# Patient Record
Sex: Female | Born: 1985
Health system: Southern US, Community
[De-identification: ages and names within clinical notes are randomized; demographics above are authoritative.]

## PROBLEM LIST (undated history)

## (undated) DIAGNOSIS — R935 Abnormal findings on diagnostic imaging of other abdominal regions, including retroperitoneum: Secondary | ICD-10-CM

## (undated) DIAGNOSIS — F329 Major depressive disorder, single episode, unspecified: Secondary | ICD-10-CM

## (undated) DIAGNOSIS — Z87440 Personal history of urinary (tract) infections: Secondary | ICD-10-CM

## (undated) DIAGNOSIS — F419 Anxiety disorder, unspecified: Secondary | ICD-10-CM

## (undated) DIAGNOSIS — Z8619 Personal history of other infectious and parasitic diseases: Secondary | ICD-10-CM

## (undated) DIAGNOSIS — O24419 Gestational diabetes mellitus in pregnancy, unspecified control: Secondary | ICD-10-CM

## (undated) DIAGNOSIS — K5900 Constipation, unspecified: Secondary | ICD-10-CM

## (undated) DIAGNOSIS — R109 Unspecified abdominal pain: Secondary | ICD-10-CM

## (undated) DIAGNOSIS — IMO0002 Reserved for concepts with insufficient information to code with codable children: Secondary | ICD-10-CM

## (undated) DIAGNOSIS — E119 Type 2 diabetes mellitus without complications: Secondary | ICD-10-CM

## (undated) DIAGNOSIS — R87619 Unspecified abnormal cytological findings in specimens from cervix uteri: Secondary | ICD-10-CM

## (undated) DIAGNOSIS — F32A Depression, unspecified: Secondary | ICD-10-CM

## (undated) DIAGNOSIS — R87629 Unspecified abnormal cytological findings in specimens from vagina: Secondary | ICD-10-CM

## (undated) DIAGNOSIS — K759 Inflammatory liver disease, unspecified: Secondary | ICD-10-CM

## (undated) HISTORY — DX: Personal history of urinary (tract) infections: Z87.440

## (undated) HISTORY — DX: Anxiety disorder, unspecified: F41.9

## (undated) HISTORY — DX: Gestational diabetes mellitus in pregnancy, unspecified control: O24.419

## (undated) HISTORY — DX: Abnormal findings on diagnostic imaging of other abdominal regions, including retroperitoneum: R93.5

## (undated) HISTORY — DX: Unspecified abdominal pain: R10.9

## (undated) HISTORY — DX: Unspecified abnormal cytological findings in specimens from vagina: R87.629

## (undated) HISTORY — DX: Type 2 diabetes mellitus without complications: E11.9

## (undated) HISTORY — DX: Personal history of other infectious and parasitic diseases: Z86.19

## (undated) HISTORY — PX: WISDOM TOOTH EXTRACTION: SHX21

## (undated) HISTORY — DX: Constipation, unspecified: K59.00

## (undated) HISTORY — PX: ESOPHAGOGASTRODUODENOSCOPY ENDOSCOPY: SHX5814

---
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
## 2004-03-16 ENCOUNTER — Inpatient Hospital Stay (HOSPITAL_COMMUNITY): Admission: EM | Admit: 2004-03-16 | Discharge: 2004-03-21 | Payer: Self-pay | Admitting: Emergency Medicine

## 2005-11-15 ENCOUNTER — Emergency Department (HOSPITAL_COMMUNITY): Admission: EM | Admit: 2005-11-15 | Discharge: 2005-11-15 | Payer: Self-pay | Admitting: Emergency Medicine

## 2009-08-21 ENCOUNTER — Emergency Department (HOSPITAL_COMMUNITY): Admission: EM | Admit: 2009-08-21 | Discharge: 2009-08-21 | Payer: Self-pay | Admitting: Emergency Medicine

## 2010-10-26 NOTE — H&P (Signed)
NAMEJANECIA, PALAU             ACCOUNT NO.:  [id number]   MEDICAL RECORD NO.:  [id number]          PATIENT TYPE:  INP   LOCATION:  0356                         FACILITY:  [facility]   PHYSICIAN:  Theone Stanley, MD   DATE OF BIRTH:  01/29/1986   DATE OF ADMISSION:  03/15/2004  DATE OF DISCHARGE:                                HISTORY & PHYSICAL   HISTORY OF PRESENT ILLNESS:  This is a 25 year old female who did not give  much of a history.  It is mainly from her friends and her family.  Per  patient's friend, she took 50 acetaminophen pills at 3 a.m.  Subsequently,  she was vomiting all day and finally at about 9 a.m. she told RA of the  dorm.  At that point in time she was taken to the ER.  Again, according to  the friend, patient has been very depressed, more noticeably over the past  two to three weeks she stopped going to class and to work.  Also, the  patient has been drinking nightly both beer and hard liquor, approximately 6-  pack per night.  When I asked the patient about her situation she did not  want to talk about it.  She did complain of some nausea.  Otherwise, she  denied any other pain or problems.   PAST MEDICAL HISTORY:  1.  Depression definitely for the past two years.  Has been tried on      medications but secondary to side effects they were stopped.  2.  Hepatitis B carrier.  According to the parents, her liver enzymes have      been in normal range.   MEDICATIONS:  None.   ALLERGIES:  None.   PAST SURGICAL HISTORY:  None.   FAMILY HISTORY:  Unable to obtain.  Patient is adopted.   SOCIAL HISTORY:  Patient is single.  Lives with a roommate in her dorm.  Smokes about one pack per day.  Uses marijuana on a daily basis.  Her  parents adopted her from [place].   REVIEW OF SYSTEMS:  Patient denies any headache, lightheadedness, dizziness,  fevers, chills, weight gain or weight loss.  No change in her bowel habits.  No problems with urinary issues.  Patient  does have some mid epigastric  abdominal pain.  She is nauseated, but no current emesis.  No chest pain.  No shortness of breath.  No cough.  No intolerance to heat or cold.   PHYSICAL EXAMINATION:  VITAL SIGNS:  Blood pressure 132/95, temperature  97.4, respiratory rate 20, saturating 100% on room air.  HEENT:  Head normocephalic, atraumatic.  Eyes:  2 mm, pupils reactive to  light.  Extraocular movements are intact.  Ears and nose:  No discharge.  Throat:  Oropharynx shows clear.  CARDIOVASCULAR:  Regular rate and rhythm.  No murmurs, rubs, or gallops  heard.  LUNGS:  Clear to auscultation bilaterally.  ABDOMEN:  Soft, nondistended.  Patient did have some mild tenderness in the  mid epigastric region.  No rebound.  EXTREMITIES:  No edema, cyanosis, or clubbing.  2+ pulses  dorsalis pedis and  posterior tibial pulses.  NEUROLOGIC:  Alert and oriented x3.  Strength 5/5.   LABORATORIES:  Chest x-ray was normal.  White count 6, hemoglobin 15,  hematocrit 45, platelets 324.  Sodium 136, potassium 3.7, chloride 105, CO2  22, BUN 18, creatinine 0.8, glucose 112.  Pregnancy test is negative.  Total  bilirubin 1.3, alkaline phosphatase 59, SGOT 53, slightly elevated; SGPT 30,  normal.  Acetaminophen level was 40.8 which was done at 10 p.m. on October 6  which would be 20 hours since her ingestion.  Tox screen showed positive for  marijuana, otherwise negative.   ASSESSMENT/PLAN:  1.  Aileene is a 25 year old female of [place] descent with hepatitis B carrier      state presenting with acetaminophen overdose.  Based on the [place]-      Matthew nomogram, patient has probable hepatic toxicity.  Start loading      dose of intravenous and acetylcysteine which is 150 mg/kg over an hour.      If patient tolerates that she will start on a maintenance dose of 15      mg/kg/hour.  Will check a CBC, BMP, LFTs, PT, PTT in the morning.  Will      also obtain a psychiatric consult.  2.  Polysubstance abuse.   This appears very much related to patient's      ongoing depression.  She will need counseling and help for this.  3.  Depression.  This apparently has been an issue for the past two years.      Parents have repeatedly asked the patient to get help and she has      refused.  She has tried some medications, but apparently had stopped      because of side effects.  Will get psychiatry involved.  4.  Mid epigastric abdominal pain most likely gastritis from alcohol, also      multiple emesis.  Will watch and start on PPI.  5.  Hepatitis B carrier state.  This has been monitored for quite some time      and according to the parents her LFTs have always been normal.  We will      keep this in mind with her current issue.  6.  Prophylaxis.  Start her on Protonix IV and SCDs while in bed.      AEJ/MEDQ  D:  03/16/2004  T:  03/16/2004  Job:  [id number]

## 2010-10-26 NOTE — Discharge Summary (Signed)
Kim Pacheco, Kim Pacheco             ACCOUNT NO.:  [id number]   MEDICAL RECORD NO.:  [id number]          PATIENT TYPE:  INP   LOCATION:  0374                         FACILITY:  [facility]   PHYSICIAN:  Jackie Plum, M.D.DATE OF BIRTH:  11-18-1985   DATE OF ADMISSION:  03/15/2004  DATE OF DISCHARGE:  03/21/2004                           DISCHARGE SUMMARY - REFERRING   ADDENDUM:  This is an addendum to March 17, 2004 discharge summary.   The patient was anticipated to be discharged on the eighth, however, because  of an increase in her liver enzymes she had her discharge delayed.  Her case  was discussed with Poison Control and it was felt that it was unusual for  her PT, INR to remain normal but her liver enzymes to rise again.  However  they decided on consultation with Dr. Greggory Stallion Osei-Bonsu,  to reinitiate some  acetylcysteine IV.  She received another dose for 24 hours at 15mg /kg.  Her liver function tests dropped down nicely.  Today her SGOT was 26, SGPT  90 (SGOT being in normal range and SGPT again regressing downwards).  Her PT  was 13, INR of 1.  The patient was doing well enough and had no complaints.  It was felt that she could be discharged to [facility] psychiatric ward for  further inpatient psychiatric evaluation and treatment.  The patient left in  stable condition.  Please see original discharge summary by Dr. Amada Kingfisher-  Bonsu on March 17, 2004 for further details, job number [id number].     Geor   GO/MEDQ  D:  03/21/2004  T:  03/21/2004  Job:  [id number]

## 2010-10-26 NOTE — Discharge Summary (Signed)
Kim Pacheco, Kim Pacheco             ACCOUNT NO.:  [id number]   MEDICAL RECORD NO.:  [id number]          PATIENT TYPE:  INP   LOCATION:  0356                         [facility]   PHYSICIAN:  Jackie Plum, M.D.DATE OF BIRTH:  1986-05-18   DATE OF ADMISSION:  03/15/2004  DATE OF DISCHARGE:  03/17/2004                                 DISCHARGE SUMMARY   DISCHARGE DIAGNOSES:  1.  Tylenol overdose, status post IV acetylcysteine administration .      1.  Discharge liver function tests of PT 15.5, INR 1.3, PTT 25, alkaline          phosphatase 44, total bilirubin 0.5, SGOT 85, SGPT 72, total protein          5.8, albumin 2.8.  2.  Major depressive disorder.  3.  History of hepatitis B.  4.  History of polysubstance abuse.   DISCHARGE MEDICATIONS:  1.  Protonix 40 mg p.o. daily.  2.  Phenergan 12.5 to 25 mg p.o. q.6h. p.r.n.  3.  Maalox 30 ml p.o. q.6h. p.r.n.   DISCHARGE LABORATORY DATA:  WBC count of 11.7, hemoglobin 13.9, hematocrit  40.9, MCV 89.5, platelet count 298,000. Coagulation profile as mentioned  above. Sodium 138, potassium 4.0, chloride 110, CO2 23, glucose 89, BUN 4,  creatinine 0.9. LFTs as noted above.  Acetaminophen level less than 10.0.   CONSULTATIONS:  Dr. Jeanie Sewer of psychiatry.   PROCEDURE:  Not applicable.   CONDITION ON DISCHARGE:  Improved and satisfactory.   DISPOSITION:  The patient is going to [facility] for inpatient  psychiatric treatment.   REASON FOR ADMISSION:  Tylenol overdose/suicide attempt.   The patient presented to the [facility] emergency room after ingestion of  about 50 tablets of acetaminophen pills. She was noted to be depressed for  some time prior to this episode.  She had also been drinking a lot during  this period as well. She was brought to the ED on account of persistent  vomiting. According to H&P by Dr. Wynonia Lawman, patient was ___________ on  presentation and her cardiovascular exam was unremarkable. Her lungs  were  clear to auscultation bilaterally. Abdomen was noted to be soft,  nondistended, but had some mild tenderness in the mid epigastric area. She  is alert and oriented times three on neurologic testing without any focal  deficits.   Her admission labs include __________ which was said to be normal. Her  chemistries were essentially unremarkable. She had an alkaline phosphatase  of 59, bilirubin 1.3, SGOT 53, and SGPT of 30.  Acetaminophen level was  40.8, which was done about 20 hours from ingestion. Toxicology screen was  positive for marijuana. She was thus admitted for Tylenol overdose and  started on IV acetylcysteine after consultation with Poison Control. During  the last 24 hours she received acetylcysteine  IV with monitoring of her  liver function testing. She also had some other supportive medicine  including IV supplementation and antiemetics amongst others. Overnight the  patient had an episode of vomiting with some complaints of abdominal pain  and this morning had completely resolved. I  discussed the patient's LFTs  this morning with Bess of Poison Control who in turn discussed over the  telephone with a toxicologist, and after review of the patient's labs (she  had some mild upward trend of her transaminases) it was deemed appropriate  to discharge the patient medically prior to resuming psychiatric treatment.  Acetylcysteine IV has been discontinued and she is going to be transferred  to [facility] on these discharge medications. This morning Ms.  Sollecito denies any abdominal pain, nausea, or vomiting. She denies any  dizziness. She is clinically well hydrated. Her lungs are clear to  auscultation. Cardiac exam reveals a regular rate and rhythm without any  gallops or murmur. Abdomen is soft, nontender. She has normoactive bowel  sounds. She is alert and oriented times three with no acute focal deficits.  She is going to be discharged to the service of  psychiatric medicine.     Geor   GO/MEDQ  D:  03/17/2004  T:  03/17/2004  Job:  [id number]   cc:   Antonietta Breach, M.D.   [facility]

## 2012-05-22 ENCOUNTER — Ambulatory Visit (INDEPENDENT_AMBULATORY_CARE_PROVIDER_SITE_OTHER): Payer: BC Managed Care – PPO | Admitting: Internal Medicine

## 2012-05-22 VITALS — BP 113/74 | HR 71 | Temp 98.0°F | Resp 16 | Ht 59.75 in | Wt 126.0 lb

## 2012-05-22 DIAGNOSIS — R109 Unspecified abdominal pain: Secondary | ICD-10-CM

## 2012-05-22 DIAGNOSIS — B181 Chronic viral hepatitis B without delta-agent: Secondary | ICD-10-CM

## 2012-05-22 LAB — POCT URINALYSIS DIPSTICK
Bilirubin, UA: NEGATIVE
Blood, UA: NEGATIVE
Glucose, UA: NEGATIVE
Leukocytes, UA: NEGATIVE
Nitrite, UA: NEGATIVE
Protein, UA: NEGATIVE
Spec Grav, UA: >=1.03
Urobilinogen, UA: 0.2
pH, UA: 5.5

## 2012-05-22 LAB — POCT UA - MICROSCOPIC ONLY
Amorphous: POSITIVE
Casts, Ur, LPF, POC: NEGATIVE
Crystals, Ur, HPF, POC: NEGATIVE
Mucus, UA: POSITIVE
Yeast, UA: NEGATIVE

## 2012-05-22 MED ORDER — HYDROCODONE-ACETAMINOPHEN 5-325 MG PO TABS: 1.0000 | ORAL_TABLET | Freq: Four times a day (QID) | ORAL | Status: DC | PRN

## 2012-05-22 NOTE — Patient Instructions (Signed)
Sleep Apnea  Sleep apnea is a sleep disorder characterized by abnormal pauses in breathing while you sleep. When your breathing pauses, the level of oxygen in your blood decreases. This causes you to move out of deep sleep and into light sleep. As a result, your quality of sleep is poor, and the system that carries your blood throughout your body (cardiovascular system) experiences stress. If sleep apnea remains untreated, the following conditions can develop:  High blood pressure (hypertension).  Coronary artery disease.  Inability to achieve or maintain an erection (impotence).  Impairment of your thought process (cognitive dysfunction). There are three types of sleep apnea: 1. Obstructive sleep apnea Pauses in breathing during sleep because of a blocked airway. 2. Central sleep apnea Pauses in breathing during sleep because the area of the brain that controls your breathing does not send the correct signals to the muscles that control breathing. 3. Mixed sleep apnea A combination of both obstructive and central sleep apnea. RISK FACTORS The following risk factors can increase your risk of developing sleep apnea:  Being overweight.  Smoking.  Having narrow passages in your nose and throat.  Being of older age.  Being female.  Alcohol use.  Sedative and tranquilizer use.  Ethnicity. Among individuals younger than 35 years, African Americans are at increased risk of sleep apnea. SYMPTOMS   Difficulty staying asleep.  Daytime sleepiness and fatigue.  Loss of energy.  Irritability.  Loud, heavy snoring.  Morning headaches.  Trouble concentrating.  Forgetfulness.  Decreased interest in sex. DIAGNOSIS  In order to diagnose sleep apnea, your caregiver will perform a physical examination. Your caregiver may suggest that you take a home sleep test. Your caregiver may also recommend that you spend the night in a sleep lab. In the sleep lab, several monitors record  information about your heart, lungs, and brain while you sleep. Your leg and arm movements and blood oxygen level are also recorded. TREATMENT The following actions may help to resolve mild sleep apnea:  Sleeping on your side.   Using a decongestant if you have nasal congestion.   Avoiding the use of depressants, including alcohol, sedatives, and narcotics.   Losing weight and modifying your diet if you are overweight. There also are devices and treatments to help open your airway:  Oral appliances. These are custom-made mouthpieces that shift your lower jaw forward and slightly open your bite. This opens your airway.  Devices that create positive airway pressure. This positive pressure "splints" your airway open to help you breathe better during sleep. The following devices create positive airway pressure:  Continuous positive airway pressure (CPAP) device. The CPAP device creates a continuous level of air pressure with an air pump. The air is delivered to your airway through a mask while you sleep. This continuous pressure keeps your airway open.  Nasal expiratory positive airway pressure (EPAP) device. The EPAP device creates positive air pressure as you exhale. The device consists of single-use valves, which are inserted into each nostril and held in place by adhesive. The valves create very little resistance when you inhale but create much more resistance when you exhale. That increased resistance creates the positive airway pressure. This positive pressure while you exhale keeps your airway open, making it easier to breath when you inhale again.  Bilevel positive airway pressure (BPAP) device. The BPAP device is used mainly in patients with central sleep apnea. This device is similar to the CPAP device because it also uses an air pump to deliver   continuous air pressure through a mask. However, with the BPAP machine, the pressure is set at two different levels. The pressure when you  exhale is lower than the pressure when you inhale.  Surgery. Typically, surgery is only done if you cannot comply with less invasive treatments or if the less invasive treatments do not improve your condition. Surgery involves removing excess tissue in your airway to create a wider passage way. Document Released: 05/17/2002 Document Revised: 11/26/2011 Document Reviewed: 10/03/2011 Exit[employer] Patient Information 2013 [employer].  

## 2012-05-22 NOTE — Progress Notes (Signed)
  Subjective:    Patient ID: Kim Pacheco, female    DOB: May 15, 1986, 26 y.o.   MRN: [id number]  HPI Has left side and flank pain since heavy workout 2 days ago   Review of Systems     Objective:   Physical Exam  Constitutional: She is oriented to person, place, and time. She appears well-developed and well-nourished. No distress.  Eyes: EOM are normal.  Neck: Neck supple.  Cardiovascular: Normal rate, regular rhythm and normal heart sounds.   Pulmonary/Chest: Effort normal and breath sounds normal.  Abdominal: Soft. Bowel sounds are normal. She exhibits no distension and no mass. There is tenderness. There is no rebound and no guarding.  Musculoskeletal: She exhibits tenderness.  Neurological: She is alert and oriented to person, place, and time. She exhibits normal muscle tone. Coordination normal.  Skin: Skin is warm and dry. No rash noted.  Psychiatric: She has a normal mood and affect.   Tender left flank/side more in muscle and back than abdomen  Results for orders placed in visit on 05/22/12  POCT URINALYSIS DIPSTICK      Component Value Range   Color, UA yellow     Clarity, UA clear     Glucose, UA neg     Bilirubin, UA neg     Ketones, UA trace     Spec Grav, UA >=1.030     Blood, UA neg     pH, UA 5.5     Protein, UA neg     Urobilinogen, UA 0.2     Nitrite, UA neg     Leukocytes, UA Negative    POCT UA - MICROSCOPIC ONLY      Component Value Range   WBC, Ur, HPF, POC 0-3     RBC, urine, microscopic 0-1     Bacteria, U Microscopic trace     Mucus, UA pos     Epithelial cells, urine per micros 1-4     Crystals, Ur, HPF, POC neg     Casts, Ur, LPF, POC neg     Yeast, UA neg     Amorphous pos          Assessment & Plan:  Strain likely Ice rest observe Do further testing if persists

## 2012-05-25 ENCOUNTER — Telehealth: Payer: Self-pay

## 2012-05-25 NOTE — Telephone Encounter (Signed)
PT SAW DR. Perrin Maltese FOR SIDE PAIN LAST WEEK.  SHE WAS PRESCRIBED HYDROCODONE.  SAYS IT IS MAKING HER SICK AND IS NOT HELPING THE PAIN AT ALL.  CAN WE CALL HER IN SOMETHING ELSE.  [phone]

## 2012-05-25 NOTE — Telephone Encounter (Signed)
Has she been taking any tylenol or ibuprofen? It looks like he thought this was a strained muscle.  If it is not improving she should return for further evaluation.

## 2012-05-25 NOTE — Telephone Encounter (Signed)
Patient advised to try Ibuprofen 600mg  tid for this, if no relief she is to return to clinic.

## 2012-05-25 NOTE — Telephone Encounter (Signed)
She states the meds make her dizzy and she has nausea. She has d/c this medication. The pain she was having has gotten a little better, but still has some pain at times, would like something else besides the hydrocodone, please advise.

## 2012-05-25 NOTE — Telephone Encounter (Signed)
Called patient to see how she is with her pain, was flank/ abdominal pain if not better and now she feels sick she should return to clinic. Left message for her to call me back.

## 2012-07-28 LAB — OB RESULTS CONSOLE HEPATITIS B SURFACE ANTIGEN: Hepatitis B Surface Ag: POSITIVE

## 2012-07-28 LAB — OB RESULTS CONSOLE RPR: RPR: NONREACTIVE

## 2012-07-28 LAB — OB RESULTS CONSOLE GC/CHLAMYDIA: Gonorrhea: NEGATIVE

## 2012-07-28 LAB — OB RESULTS CONSOLE ABO/RH: RH Type: POSITIVE

## 2012-09-10 ENCOUNTER — Ambulatory Visit (INDEPENDENT_AMBULATORY_CARE_PROVIDER_SITE_OTHER): Payer: BC Managed Care – PPO | Admitting: Family Medicine

## 2012-09-10 VITALS — BP 110/64 | HR 72 | Temp 98.6°F | Resp 16 | Ht 59.0 in | Wt 134.2 lb

## 2012-09-10 DIAGNOSIS — J069 Acute upper respiratory infection, unspecified: Secondary | ICD-10-CM

## 2012-09-10 LAB — POCT RAPID STREP A (OFFICE): Rapid Strep A Screen: NEGATIVE

## 2012-09-10 MED ORDER — AMOXICILLIN 500 MG PO CAPS: 500.0000 mg | ORAL_CAPSULE | Freq: Three times a day (TID) | ORAL | Status: DC

## 2012-09-10 MED ORDER — IPRATROPIUM BROMIDE 0.03 % NA SOLN: 2.0000 | Freq: Two times a day (BID) | NASAL | Status: DC

## 2012-09-10 NOTE — Progress Notes (Signed)
[address]   [place], [place]  [address]   [phone]  Subjective:    Patient ID: Kim Pacheco, female    DOB: January 27, 1986, 27 y.o.   MRN: [id number]  HPI This 27 y.o. female presents for evaluation of sore throat, rhinorrhea, nasal congestion.  Onset two days ago.  Onset with sore throat, malaise, fatigue, headache, nasal congestion.  No fever/chills/sweats.  +HA.  R ear pain mild.  +ST top mostly; dry; sore throat intermittent; mild pain with swallowing.  No coughing.  No n/v/d.  +sneezing a lot.  +itchy eyes.  [redacted] weeks pregnant.  Has taken Robitussin night and day.  No history of allergic rhinitis.  OB:  [facility]   Review of Systems  Constitutional: Positive for fatigue. Negative for fever, chills and diaphoresis.  HENT: Positive for ear pain, congestion, sore throat, rhinorrhea, sneezing, trouble swallowing, voice change and postnasal drip.   Respiratory: Negative for cough and shortness of breath.   Gastrointestinal: Negative for nausea, vomiting and diarrhea.  Skin: Negative for rash.  Neurological: Positive for headaches.    History reviewed. No pertinent past medical history.  History reviewed. No pertinent past surgical history.  Prior to Admission medications   Medication Sig Start Date End Date Taking? Authorizing Provider  Prenatal Vit-Fe Fumarate-FA (PRENATAL MULTIVITAMIN) TABS Take 1 tablet by mouth daily at 12 noon.   Yes Historical Provider, MD  HYDROcodone-acetaminophen (NORCO/VICODIN) 5-325 MG per tablet Take 1 tablet by mouth every 6 (six) hours as needed for pain. 05/22/12   Jonita Albee, MD    No Known Allergies  History   Social History  . Marital Status: Married    Spouse Name: N/A    Number of Children: N/A  . Years of Education: N/A   Occupational History  . Not on file.   Social History Main Topics  . Smoking status: Former Smoker -- 1.00 packs/day    Types: Cigarettes  . Smokeless tobacco: Never Used     Comment: Patient  would like counseling  . Alcohol Use: No  . Drug Use: No  . Sexually Active: Not on file   Other Topics Concern  . Not on file   Social History Narrative   Chemical engineer.    No family history on file.     Objective:   Physical Exam  Nursing note and vitals reviewed. Constitutional: She is oriented to person, place, and time. She appears well-developed and well-nourished. No distress.  HENT:  Head: Normocephalic and atraumatic.  Right Ear: External ear normal.  Left Ear: External ear normal.  Nose: Nose normal.  Mouth/Throat: Oropharynx is clear and moist. No oropharyngeal exudate.  Eyes: Conjunctivae are normal. Pupils are equal, round, and reactive to light.  Neck: Normal range of motion. Neck supple. No thyromegaly present.  Cardiovascular: Normal rate, regular rhythm, normal heart sounds and intact distal pulses.  Exam reveals no gallop and no friction rub.   No murmur heard. Pulmonary/Chest: Effort normal and breath sounds normal. No respiratory distress. She has no wheezes. She has no rales.  Abdominal:  Gravid.  Lymphadenopathy:    She has no cervical adenopathy.  Neurological: She is alert and oriented to person, place, and time.  Skin: She is not diaphoretic.  Psychiatric: She has a normal mood and affect. Her behavior is normal.    Results for orders placed in visit on 09/10/12  POCT RAPID STREP A (OFFICE)      Result Value Range  Rapid Strep A Screen Negative  Negative       Assessment & Plan:  URI (upper respiratory infection) - Plan: POCT rapid strep A, Culture, Group A Strep    1. URI:  New.  Rapid strep negative; send throat culture.  Rx for Atrovent nasal spray (Class B) bid; rx for Amoxicillin provided to fill if throat culture positive.  Supportive care with rest, fluids, Tylenol.  OOW note x 4/3, 4/4.  2. Pregnancy:  At 18 weeks; supportive care with rest, fluids, Tylenol.  Meds ordered this encounter  Medications  . Prenatal  Vit-Fe Fumarate-FA (PRENATAL MULTIVITAMIN) TABS    Sig: Take 1 tablet by mouth daily at 12 noon.  Marland Kitchen ipratropium (ATROVENT) 0.03 % nasal spray    Sig: Place 2 sprays into the nose 2 (two) times daily.    Dispense:  30 mL    Refill:  5  . amoxicillin (AMOXIL) 500 MG capsule    Sig: Take 1 capsule (500 mg total) by mouth 3 (three) times daily.    Dispense:  30 capsule    Refill:  0

## 2012-09-10 NOTE — Patient Instructions (Addendum)
URI (upper respiratory infection) - Plan: POCT rapid strep A, Culture, Group A Strep, ipratropium (ATROVENT) 0.03 % nasal spray, amoxicillin (AMOXIL) 500 MG capsule  Upper Respiratory Infection, Adult An upper respiratory infection (URI) is also sometimes known as the common cold. The upper respiratory tract includes the nose, sinuses, throat, trachea, and bronchi. Bronchi are the airways leading to the lungs. Most people improve within 1 week, but symptoms can last up to 2 weeks. A residual cough may last even longer.  CAUSES Many different viruses can infect the tissues lining the upper respiratory tract. The tissues become irritated and inflamed and often become very moist. Mucus production is also common. A cold is contagious. You can easily spread the virus to others by oral contact. This includes kissing, sharing a glass, coughing, or sneezing. Touching your mouth or nose and then touching a surface, which is then touched by another person, can also spread the virus. SYMPTOMS  Symptoms typically develop 1 to 3 days after you come in contact with a cold virus. Symptoms vary from person to person. They may include:  Runny nose.  Sneezing.  Nasal congestion.  Sinus irritation.  Sore throat.  Loss of voice (laryngitis).  Cough.  Fatigue.  Muscle aches.  Loss of appetite.  Headache.  Low-grade fever. DIAGNOSIS  You might diagnose your own cold based on familiar symptoms, since most people get a cold 2 to 3 times a year. Your caregiver can confirm this based on your exam. Most importantly, your caregiver can check that your symptoms are not due to another disease such as strep throat, sinusitis, pneumonia, asthma, or epiglottitis. Blood tests, throat tests, and X-rays are not necessary to diagnose a common cold, but they may sometimes be helpful in excluding other more serious diseases. Your caregiver will decide if any further tests are required. RISKS AND COMPLICATIONS  You may  be at risk for a more severe case of the common cold if you smoke cigarettes, have chronic heart disease (such as heart failure) or lung disease (such as asthma), or if you have a weakened immune system. The very young and very old are also at risk for more serious infections. Bacterial sinusitis, middle ear infections, and bacterial pneumonia can complicate the common cold. The common cold can worsen asthma and chronic obstructive pulmonary disease (COPD). Sometimes, these complications can require emergency medical care and may be life-threatening. PREVENTION  The best way to protect against getting a cold is to practice good hygiene. Avoid oral or hand contact with people with cold symptoms. Wash your hands often if contact occurs. There is no clear evidence that vitamin C, vitamin E, echinacea, or exercise reduces the chance of developing a cold. However, it is always recommended to get plenty of rest and practice good nutrition. TREATMENT  Treatment is directed at relieving symptoms. There is no cure. Antibiotics are not effective, because the infection is caused by a virus, not by bacteria. Treatment may include:  Increased fluid intake. Sports drinks offer valuable electrolytes, sugars, and fluids.  Breathing heated mist or steam (vaporizer or shower).  Eating chicken soup or other clear broths, and maintaining good nutrition.  Getting plenty of rest.  Using gargles or lozenges for comfort.  Controlling fevers with ibuprofen or acetaminophen as directed by your caregiver.  Increasing usage of your inhaler if you have asthma. Zinc gel and zinc lozenges, taken in the first 24 hours of the common cold, can shorten the duration and lessen the severity of symptoms.  Pain medicines may help with fever, muscle aches, and throat pain. A variety of non-prescription medicines are available to treat congestion and runny nose. Your caregiver can make recommendations and may suggest nasal or lung  inhalers for other symptoms.  HOME CARE INSTRUCTIONS   Only take over-the-counter or prescription medicines for pain, discomfort, or fever as directed by your caregiver.  Use a warm mist humidifier or inhale steam from a shower to increase air moisture. This may keep secretions moist and make it easier to breathe.  Drink enough water and fluids to keep your urine clear or pale yellow.  Rest as needed.  Return to work when your temperature has returned to normal or as your caregiver advises. You may need to stay home longer to avoid infecting others. You can also use a face mask and careful hand washing to prevent spread of the virus. SEEK MEDICAL CARE IF:   After the first few days, you feel you are getting worse rather than better.  You need your caregiver's advice about medicines to control symptoms.  You develop chills, worsening shortness of breath, or brown or red sputum. These may be signs of pneumonia.  You develop yellow or brown nasal discharge or pain in the face, especially when you bend forward. These may be signs of sinusitis.  You develop a fever, swollen neck glands, pain with swallowing, or white areas in the back of your throat. These may be signs of strep throat. SEEK IMMEDIATE MEDICAL CARE IF:   You have a fever.  You develop severe or persistent headache, ear pain, sinus pain, or chest pain.  You develop wheezing, a prolonged cough, cough up blood, or have a change in your usual mucus (if you have chronic lung disease).  You develop sore muscles or a stiff neck. Document Released: 11/20/2000 Document Revised: 08/19/2011 Document Reviewed: 09/28/2010 [facility] Patient Information 2013 [place], [place].

## 2012-09-12 LAB — CULTURE, GROUP A STREP: Organism ID, Bacteria: NORMAL

## 2012-11-25 ENCOUNTER — Encounter: Payer: BC Managed Care – PPO | Attending: Obstetrics and Gynecology | Admitting: *Deleted

## 2012-11-25 VITALS — Ht 59.75 in | Wt 154.1 lb

## 2012-11-25 DIAGNOSIS — O99810 Abnormal glucose complicating pregnancy: Secondary | ICD-10-CM

## 2012-11-25 DIAGNOSIS — Z713 Dietary counseling and surveillance: Secondary | ICD-10-CM | POA: Insufficient documentation

## 2012-11-26 ENCOUNTER — Encounter: Payer: Self-pay | Admitting: *Deleted

## 2012-11-26 NOTE — Progress Notes (Signed)
  Patient was seen on 11/25/12 for Gestational Diabetes self-management class at the [facility]. The following learning objectives were met by the patient during this course:   States the definition of Gestational Diabetes  States why dietary management is important in controlling blood glucose  Describes the effects each nutrient has on blood glucose levels  Demonstrates ability to create a balanced meal plan  Demonstrates carbohydrate counting   States when to check blood glucose levels  Demonstrates proper blood glucose monitoring techniques  States the effect of stress and exercise on blood glucose levels  States the importance of limiting caffeine and abstaining from alcohol and smoking  Blood glucose monitor given: [employer] EZ Lot # [id number] Exp: 10/2013 Blood glucose reading: 185 mg/dl  Patient instructed to monitor glucose levels: FBS: 60 - <90 2 hour: <120  *Patient received handouts:  Nutrition Diabetes and Pregnancy  Carbohydrate Counting List  Patient will be seen for follow-up as needed.

## 2012-11-26 NOTE — Patient Instructions (Signed)
Goals:  Check glucose levels per MD as instructed  Follow Gestational Diabetes Diet as instructed  Call for follow-up as needed    

## 2012-12-14 ENCOUNTER — Encounter: Payer: Self-pay | Admitting: *Deleted

## 2012-12-14 ENCOUNTER — Encounter: Payer: BC Managed Care – PPO | Attending: Obstetrics and Gynecology | Admitting: *Deleted

## 2012-12-14 DIAGNOSIS — Z713 Dietary counseling and surveillance: Secondary | ICD-10-CM | POA: Insufficient documentation

## 2012-12-14 DIAGNOSIS — O99810 Abnormal glucose complicating pregnancy: Secondary | ICD-10-CM | POA: Insufficient documentation

## 2012-12-14 NOTE — Progress Notes (Signed)
  Medical Nutrition Therapy:  Appt start time: 1030 end time:  1100.  Assessment:  Primary concerns today: patient here for follow up to GDM Class. She states she is following the GDM diet and is exercising but her FBG and post meals are consistently too high. She is concerned about having to take insulin.   MEDICATIONS: see list.   DIETARY INTAKE:  Usual eating pattern includes 3 meals and 3 snacks per day.  Everyday foods include good variety of all food groups in appropriate serving sizes.  Avoided foods include foods high in carbohydrate or fat.    24-hr recall:  B ( AM): 2 hard boiled eggs, 1 slice toast, low fat cream cheese, 8 oz low fat milk  Snk ( AM): yogurt  L ( PM): chicken with 1/2 to 1 cup pasta or a salad, water Snk ( PM): cheese or a banana, PNB, water D ( PM): chicken with pasta, occasionally a vegetable, water Snk ( PM): PNB on a spoon Beverages: water, milk  Usual physical activity: yoga and piliates in AM, takes a 25 to 45 minute walk after dinner each night.  Progress Towards Goal(s):  In progress.   Nutritional Diagnosis:  NB-1.1 Food and nutrition-related knowledge deficit As related to BG management with GDM.  As evidenced by elevated BG both fasting and post meal.    Intervention:  Reviewed physiology of GDM and explained increase in insulin resistance as pregnancy continues. Assessed that her carb intake is adequate, good choice of variety of food groups and good activity level. Showed her the various insulin types and what their action times are and allowed her to do a sample injection which she did without difficulty. She plans to follow up with her MD for further instruction and insulin orders.  Handouts given during visit include:  Insulin action handout  Monitoring/Evaluation:  Dietary intake, exercise, reading food labels, and body weight prn.

## 2012-12-25 ENCOUNTER — Encounter: Payer: BC Managed Care – PPO | Admitting: *Deleted

## 2012-12-25 DIAGNOSIS — O99810 Abnormal glucose complicating pregnancy: Secondary | ICD-10-CM

## 2012-12-28 ENCOUNTER — Ambulatory Visit: Payer: BC Managed Care – PPO | Admitting: *Deleted

## 2013-01-02 NOTE — Progress Notes (Signed)
Insulin Instruction  Patient was seen on 12/25/2012 for insulin instruction.  The following learning objectives were met by the patient during this visit:   Insulin Action of Regular and NPH insulins  Reviewed syringe & vial VS pen including # units per syringe,    length of needles, vial VS Pen cartridge and needles  Hygiene and storage  Drawing up single and mixed doses if using vials   Single dose   Mixed dose:   Rotation of Sites  Hypoglycemia- symptoms, causes , treatment choices  Record keeping and MD follow up  Hypoglycemia, causes, symptoms and treatment   Patient demonstrated understanding of insulin administration by return demonstration.  Patient received the following handouts:  Insulin Instruction Handout  Mixing Insulin Brochure by [facility] Getting Started                                        Patient to start on insulin as Rx'd by MD  Patient will be seen for follow-up as needed.

## 2013-01-05 LAB — OB RESULTS CONSOLE GBS: GBS: NEGATIVE

## 2013-01-26 ENCOUNTER — Encounter (HOSPITAL_COMMUNITY)
Admission: AD | Disposition: A | Discharge status: Home / Self Care | Payer: Self-pay | Source: Ambulatory Visit | Attending: Obstetrics & Gynecology

## 2013-01-26 ENCOUNTER — Inpatient Hospital Stay (HOSPITAL_COMMUNITY): Payer: BC Managed Care – PPO | Admitting: Anesthesiology

## 2013-01-26 ENCOUNTER — Encounter (HOSPITAL_COMMUNITY): Payer: Self-pay | Admitting: Anesthesiology

## 2013-01-26 ENCOUNTER — Encounter (HOSPITAL_COMMUNITY): Payer: Self-pay | Admitting: *Deleted

## 2013-01-26 ENCOUNTER — Inpatient Hospital Stay (HOSPITAL_COMMUNITY)
Admission: AD | Admit: 2013-01-26 | Discharge: 2013-01-30 | DRG: 370 | Disposition: A | Discharge status: Home / Self Care | Payer: BC Managed Care – PPO | Source: Ambulatory Visit | Attending: Obstetrics & Gynecology | Admitting: Obstetrics & Gynecology

## 2013-01-26 DIAGNOSIS — O26619 Liver and biliary tract disorders in pregnancy, unspecified trimester: Secondary | ICD-10-CM | POA: Diagnosis present

## 2013-01-26 DIAGNOSIS — O99814 Abnormal glucose complicating childbirth: Secondary | ICD-10-CM | POA: Diagnosis present

## 2013-01-26 DIAGNOSIS — Z9889 Other specified postprocedural states: Secondary | ICD-10-CM

## 2013-01-26 DIAGNOSIS — Z794 Long term (current) use of insulin: Secondary | ICD-10-CM

## 2013-01-26 DIAGNOSIS — O411090 Infection of amniotic sac and membranes, unspecified, unspecified trimester, not applicable or unspecified: Secondary | ICD-10-CM | POA: Diagnosis present

## 2013-01-26 DIAGNOSIS — B181 Chronic viral hepatitis B without delta-agent: Secondary | ICD-10-CM | POA: Diagnosis present

## 2013-01-26 HISTORY — DX: Unspecified abnormal cytological findings in specimens from cervix uteri: R87.619

## 2013-01-26 HISTORY — DX: Major depressive disorder, single episode, unspecified: F32.9

## 2013-01-26 HISTORY — DX: Inflammatory liver disease, unspecified: K75.9

## 2013-01-26 HISTORY — DX: Depression, unspecified: F32.A

## 2013-01-26 HISTORY — DX: Reserved for concepts with insufficient information to code with codable children: IMO0002

## 2013-01-26 LAB — CBC
Hemoglobin: 11.6 g/dL — ABNORMAL LOW (ref 12.0–15.0)
MCHC: 33.7 g/dL (ref 30.0–36.0)
RBC: 3.95 MIL/uL (ref 3.87–5.11)
WBC: 8.1 10*3/uL (ref 4.0–10.5)

## 2013-01-26 LAB — GLUCOSE, CAPILLARY
Glucose-Capillary: 85 mg/dL (ref 70–99)
Glucose-Capillary: 69 mg/dL — ABNORMAL LOW (ref 70–99)

## 2013-01-26 LAB — RPR: RPR Ser Ql: NONREACTIVE

## 2013-01-26 LAB — TYPE AND SCREEN

## 2013-01-26 LAB — POCT FERN TEST: POCT Fern Test: POSITIVE

## 2013-01-26 SURGERY — Surgical Case
Anesthesia: Epidural | Site: Abdomen | Wound class: Clean Contaminated

## 2013-01-26 MED ORDER — DEXTROSE IN LACTATED RINGERS 5 % IV SOLN
INTRAVENOUS | Status: DC
  Administered 2013-01-26: 125 mL/h via INTRAVENOUS

## 2013-01-26 MED ORDER — LACTATED RINGERS IV SOLN: 500.0000 mL | INTRAVENOUS | Status: DC | PRN

## 2013-01-26 MED ORDER — ROCURONIUM BROMIDE 50 MG/5ML IV SOLN
INTRAVENOUS | Status: AC
  Filled 2013-01-26: qty 1

## 2013-01-26 MED ORDER — CITRIC ACID-SODIUM CITRATE 334-500 MG/5ML PO SOLN
30.0000 mL | ORAL | Status: DC | PRN
  Administered 2013-01-26: 30 mL via ORAL
  Filled 2013-01-26: qty 15

## 2013-01-26 MED ORDER — OXYTOCIN 40 UNITS IN LACTATED RINGERS INFUSION - SIMPLE MED
1.0000 m[IU]/min | INTRAVENOUS | Status: DC
  Administered 2013-01-26: 14 m[IU]/min via INTRAVENOUS
  Administered 2013-01-26: 2 m[IU]/min via INTRAVENOUS
  Administered 2013-01-26: 20 m[IU]/min via INTRAVENOUS
  Administered 2013-01-26: 26 m[IU]/min via INTRAVENOUS

## 2013-01-26 MED ORDER — OXYTOCIN BOLUS FROM INFUSION: 500.0000 mL | INTRAVENOUS | Status: DC

## 2013-01-26 MED ORDER — BUTORPHANOL TARTRATE 1 MG/ML IJ SOLN
1.0000 mg | INTRAMUSCULAR | Status: DC | PRN
  Administered 2013-01-26 (×2): 1 mg via INTRAVENOUS
  Filled 2013-01-26 (×2): qty 1

## 2013-01-26 MED ORDER — EPHEDRINE 5 MG/ML INJ
10.0000 mg | INTRAVENOUS | Status: DC | PRN
  Filled 2013-01-26: qty 4

## 2013-01-26 MED ORDER — FLEET ENEMA 7-19 GM/118ML RE ENEM: 1.0000 | ENEMA | RECTAL | Status: DC | PRN

## 2013-01-26 MED ORDER — FENTANYL 2.5 MCG/ML BUPIVACAINE 1/10 % EPIDURAL INFUSION (WH - ANES)
14.0000 mL/h | INTRAMUSCULAR | Status: DC | PRN
  Administered 2013-01-26 (×2): 14 mL/h via EPIDURAL
  Filled 2013-01-26 (×2): qty 125

## 2013-01-26 MED ORDER — DIPHENHYDRAMINE HCL 50 MG/ML IJ SOLN: 12.5000 mg | INTRAMUSCULAR | Status: DC | PRN

## 2013-01-26 MED ORDER — LIDOCAINE-EPINEPHRINE (PF) 2 %-1:200000 IJ SOLN
INTRAMUSCULAR | Status: AC
  Filled 2013-01-26: qty 20

## 2013-01-26 MED ORDER — LACTATED RINGERS IV SOLN
500.0000 mL | Freq: Once | INTRAVENOUS | Status: AC
  Administered 2013-01-26: 500 mL via INTRAVENOUS

## 2013-01-26 MED ORDER — SODIUM BICARBONATE 8.4 % IV SOLN
INTRAVENOUS | Status: AC
  Filled 2013-01-26: qty 50

## 2013-01-26 MED ORDER — PHENYLEPHRINE 40 MCG/ML (10ML) SYRINGE FOR IV PUSH (FOR BLOOD PRESSURE SUPPORT)
80.0000 ug | PREFILLED_SYRINGE | INTRAVENOUS | Status: DC | PRN
  Filled 2013-01-26: qty 5

## 2013-01-26 MED ORDER — OXYTOCIN 40 UNITS IN LACTATED RINGERS INFUSION - SIMPLE MED
62.5000 mL/h | INTRAVENOUS | Status: DC
  Filled 2013-01-26: qty 1000

## 2013-01-26 MED ORDER — OXYTOCIN 10 UNIT/ML IJ SOLN
INTRAMUSCULAR | Status: AC
  Filled 2013-01-26: qty 4

## 2013-01-26 MED ORDER — SODIUM CHLORIDE 0.9 % IV SOLN
2.0000 g | Freq: Four times a day (QID) | INTRAVENOUS | Status: DC
  Administered 2013-01-26: 2 g via INTRAVENOUS
  Filled 2013-01-26 (×2): qty 2000

## 2013-01-26 MED ORDER — SODIUM CHLORIDE 0.9 % IV SOLN
2.0000 g | Freq: Four times a day (QID) | INTRAVENOUS | Status: DC
  Filled 2013-01-26 (×2): qty 2000

## 2013-01-26 MED ORDER — MORPHINE SULFATE 0.5 MG/ML IJ SOLN
INTRAMUSCULAR | Status: AC
  Filled 2013-01-26: qty 10

## 2013-01-26 MED ORDER — TERBUTALINE SULFATE 1 MG/ML IJ SOLN: 0.2500 mg | Freq: Once | INTRAMUSCULAR | Status: AC | PRN

## 2013-01-26 MED ORDER — IBUPROFEN 600 MG PO TABS: 600.0000 mg | ORAL_TABLET | Freq: Four times a day (QID) | ORAL | Status: DC | PRN

## 2013-01-26 MED ORDER — LACTATED RINGERS IV SOLN
INTRAVENOUS | Status: DC
  Administered 2013-01-26 (×2): via INTRAVENOUS
  Administered 2013-01-26: 125 mL/h via INTRAVENOUS
  Administered 2013-01-26 – 2013-01-27 (×3): via INTRAVENOUS

## 2013-01-26 MED ORDER — LIDOCAINE HCL (PF) 1 % IJ SOLN: 30.0000 mL | INTRAMUSCULAR | Status: DC | PRN

## 2013-01-26 MED ORDER — CEFAZOLIN SODIUM-DEXTROSE 2-3 GM-% IV SOLR
2.0000 g | Freq: Once | INTRAVENOUS | Status: AC
  Administered 2013-01-27: 2 g via INTRAVENOUS
  Filled 2013-01-26: qty 50

## 2013-01-26 MED ORDER — LIDOCAINE HCL (PF) 1 % IJ SOLN
INTRAMUSCULAR | Status: DC | PRN
  Administered 2013-01-26 (×2): 5 mL

## 2013-01-26 MED ORDER — PHENYLEPHRINE 40 MCG/ML (10ML) SYRINGE FOR IV PUSH (FOR BLOOD PRESSURE SUPPORT): 80.0000 ug | PREFILLED_SYRINGE | INTRAVENOUS | Status: DC | PRN

## 2013-01-26 MED ORDER — OXYCODONE-ACETAMINOPHEN 5-325 MG PO TABS: 1.0000 | ORAL_TABLET | ORAL | Status: DC | PRN

## 2013-01-26 MED ORDER — EPHEDRINE 5 MG/ML INJ: 10.0000 mg | INTRAVENOUS | Status: DC | PRN

## 2013-01-26 MED ORDER — SODIUM BICARBONATE 8.4 % IV SOLN
INTRAVENOUS | Status: DC | PRN
  Administered 2013-01-26 – 2013-01-27 (×2): 5 mL via EPIDURAL

## 2013-01-26 MED ORDER — ACETAMINOPHEN 325 MG PO TABS
650.0000 mg | ORAL_TABLET | ORAL | Status: DC | PRN
  Administered 2013-01-26 (×2): 650 mg via ORAL
  Filled 2013-01-26 (×2): qty 2
  Filled 2013-01-26: qty 1

## 2013-01-26 MED ORDER — ONDANSETRON HCL 4 MG/2ML IJ SOLN
4.0000 mg | Freq: Four times a day (QID) | INTRAMUSCULAR | Status: DC | PRN
  Administered 2013-01-26 (×2): 4 mg via INTRAVENOUS
  Filled 2013-01-26 (×2): qty 2

## 2013-01-26 SURGICAL SUPPLY — 30 items
CLAMP CORD UMBIL (MISCELLANEOUS) ×2 IMPLANT
CLOTH BEACON ORANGE TIMEOUT ST (SAFETY) ×2 IMPLANT
DERMABOND ADVANCED (GAUZE/BANDAGES/DRESSINGS) ×1
DERMABOND ADVANCED .7 DNX12 (GAUZE/BANDAGES/DRESSINGS) ×1 IMPLANT
DRAPE LG THREE QUARTER DISP (DRAPES) ×2 IMPLANT
DRSG OPSITE POSTOP 4X10 (GAUZE/BANDAGES/DRESSINGS) ×2 IMPLANT
DURAPREP 26ML APPLICATOR (WOUND CARE) ×2 IMPLANT
ELECT REM PT RETURN 9FT ADLT (ELECTROSURGICAL) ×2
ELECTRODE REM PT RTRN 9FT ADLT (ELECTROSURGICAL) ×1 IMPLANT
EXTRACTOR VACUUM M CUP 4 TUBE (SUCTIONS) IMPLANT
GLOVE BIO SURGEON STRL SZ7 (GLOVE) ×2 IMPLANT
GOWN STRL REIN XL XLG (GOWN DISPOSABLE) ×4 IMPLANT
KIT ABG SYR 3ML LUER SLIP (SYRINGE) IMPLANT
NEEDLE HYPO 25X5/8 SAFETYGLIDE (NEEDLE) IMPLANT
NS IRRIG 1000ML POUR BTL (IV SOLUTION) ×2 IMPLANT
PACK C SECTION WH (CUSTOM PROCEDURE TRAY) ×2 IMPLANT
PAD OB MATERNITY 4.3X12.25 (PERSONAL CARE ITEMS) ×2 IMPLANT
RTRCTR C-SECT PINK 25CM LRG (MISCELLANEOUS) ×2 IMPLANT
STAPLER VISISTAT 35W (STAPLE) IMPLANT
SUT CHROMIC 1 CTX 36 (SUTURE) ×4 IMPLANT
SUT CHROMIC 2 0 CT 1 (SUTURE) ×2 IMPLANT
SUT PDS AB 0 CTX 60 (SUTURE) ×2 IMPLANT
SUT VIC AB 0 CT1 27 (SUTURE) ×1
SUT VIC AB 0 CT1 27XBRD ANBCTR (SUTURE) ×1 IMPLANT
SUT VIC AB 2-0 CT1 27 (SUTURE) ×1
SUT VIC AB 2-0 CT1 TAPERPNT 27 (SUTURE) ×1 IMPLANT
SUT VIC AB 4-0 KS 27 (SUTURE) ×2 IMPLANT
TOWEL OR 17X24 6PK STRL BLUE (TOWEL DISPOSABLE) ×2 IMPLANT
TRAY FOLEY CATH 14FR (SET/KITS/TRAYS/PACK) IMPLANT
WATER STERILE IRR 1000ML POUR (IV SOLUTION) IMPLANT

## 2013-01-26 NOTE — MAU Note (Signed)
Pt leaking brownish fluid since midnight.  No pain.  Gest diabetic, on insulin.

## 2013-01-26 NOTE — Anesthesia Procedure Notes (Signed)
Epidural Patient location during procedure: OB Start time: 01/26/2013 10:52 AM  Staffing Anesthesiologist: Angus Seller., Harrell Gave. Performed by: anesthesiologist   Preanesthetic Checklist Completed: patient identified, site marked, surgical consent, pre-op evaluation, timeout performed, IV checked, risks and benefits discussed and monitors and equipment checked  Epidural Patient position: sitting Prep: site prepped and draped and DuraPrep Patient monitoring: continuous pulse ox and blood pressure Approach: midline Injection technique: LOR air and LOR saline  Needle:  Needle type: Tuohy  Needle gauge: 17 G Needle length: 9 cm and 9 Needle insertion depth: 6 cm Catheter type: closed end flexible Catheter size: 19 Gauge Catheter at skin depth: 11 cm Test dose: negative  Assessment Events: blood not aspirated, injection not painful, no injection resistance, negative IV test and no paresthesia  Additional Notes Patient identified.  Risk benefits discussed including failed block, incomplete pain control, headache, nerve damage, paralysis, blood pressure changes, nausea, vomiting, reactions to medication both toxic or allergic, and postpartum back pain.  Patient expressed understanding and wished to proceed.  All questions were answered.  Sterile technique used throughout procedure and epidural site dressed with sterile barrier dressing. No paresthesia or other complications noted.The patient did not experience any signs of intravascular injection such as tinnitus or metallic taste in mouth nor signs of intrathecal spread such as rapid motor block. Please see nursing notes for vital signs.

## 2013-01-26 NOTE — H&P (Signed)
Kim Pacheco is a 27 y.o. female presenting for leaking fluid.  27 yo G6P0050 Asian female @ 38 weeks presents form leaking fluid and is confirmed spontaneous ROM.  Pts pregnancy has been complicated by chronic hepatitis B and gestational diabetes mellitus. She is currently on insulin. Her glucose control has not been adequate. History OB History   Grav Para Term Preterm Abortions TAB SAB Ect Mult Living   6    5 5  0        Past Medical History  Diagnosis Date  . Diabetes mellitus without complication   . Abnormal Pap smear   . Depression   . Hepatitis    Past Surgical History  Procedure Laterality Date  . Colposcopy     Family History: She was adopted. Family history is unknown by patient. Social History:  reports that she has quit smoking. Her smoking use included Cigarettes. She smoked 1.00 pack per day. She has never used smokeless tobacco. She reports that she does not drink alcohol or use illicit drugs.   Prenatal Transfer Tool  Maternal Diabetes: Yes:  Diabetes Type:  Insulin/Medication controlled Genetic Screening: Normal Maternal Ultrasounds/Referrals: Normal Fetal Ultrasounds or other Referrals:  Other: GI referral Maternal Substance Abuse:  No Significant Maternal Medications:  Meds include: Other: Insulin Significant Maternal Lab Results:  None Other Comments:  None  ROS  Dilation: 4.5 Effacement (%): 90 Station: -1 Exam by:: dr. Tenny Craw Blood pressure 119/71, pulse 82, temperature 98.1 F (36.7 C), temperature source Oral, resp. rate 20, height 4\' 11"  (1.499 m), weight 73.483 kg (162 lb), last menstrual period 04/13/2012. Exam Physical Exam  Prenatal labs: ABO, Rh: --/--/A POS, A POS (08/19 0144) Antibody: NEG (08/19 0144) Rubella: Immune (02/18 0000) RPR: Nonreactive (02/18 0000)  HBsAg: Negative (02/18 0000)  HIV: Non-reactive (02/18 0000)  GBS: Negative (07/29 0000)   Assessment/Plan: 1) Admit 2) Pitocin for augmentation 3) Accuchecks Q4  while in latent labor. Q1 hr while in active labor. Start glucomander prn 4) Epidural on request.    Kim Pacheco H. 01/26/2013, 9:37 AM

## 2013-01-26 NOTE — Progress Notes (Signed)
Patient ID: Kim Pacheco, female   DOB: Jul 04, 1985, 27 y.o.   MRN: [id number]  S: Pt comfortable with epidural. Pt developed a fever to 100.8 approximately 2 hours ago.   OCeasar Mons Vitals:   01/26/13 2131 01/26/13 2202 01/26/13 2230 01/26/13 2301  BP: 116/62 100/60 118/62 112/61  Pulse: 109 140 116 119  Temp: 100.8 F (38.2 C)   100.1 F (37.8 C)  TempSrc: Oral   Axillary  Resp: 20  18 20   Height:      Weight:        cxv 8/80/0 with edema, caput palpated toco Q2-3 with decreased strength to uterine contractions FHT 170-180 with accels   AP 1) Insufficient progress in labor despite many hours of adequate labor.  Cervix is now developing edema and fetal vertex with caput. Patient has now developed chorioamnionitis with resultant uterine dysfunction.  Findings discussed with the patient and her husband.  Given these findings recommended proceeding with cesarean section for failure to progress. R/B/A reviewed.  Patient and husband wish to proceed.

## 2013-01-26 NOTE — Anesthesia Preprocedure Evaluation (Signed)
Anesthesia Evaluation  Patient identified by MRN, date of birth, ID band Patient awake    Reviewed: Allergy & Precautions, H&P , Patient's Chart, lab work & pertinent test results  Airway Mallampati: II TM Distance: >3 FB Neck ROM: full    Dental no notable dental hx.    Pulmonary neg pulmonary ROS,  breath sounds clear to auscultation  Pulmonary exam normal       Cardiovascular negative cardio ROS  Rhythm:regular Rate:Normal     Neuro/Psych negative neurological ROS  negative psych ROS   GI/Hepatic negative GI ROS, Neg liver ROS, (+) Hepatitis -  Endo/Other  negative endocrine ROSdiabetes  Renal/GU negative Renal ROS     Musculoskeletal   Abdominal   Peds  Hematology negative hematology ROS (+)   Anesthesia Other Findings Diabetes mellitus without complication     Abnormal Pap smear        Depression     Hepatitis    Reproductive/Obstetrics (+) Pregnancy                           Anesthesia Physical Anesthesia Plan  ASA: III  Anesthesia Plan: Epidural   Post-op Pain Management:    Induction:   Airway Management Planned:   Additional Equipment:   Intra-op Plan:   Post-operative Plan:   Informed Consent: I have reviewed the patients History and Physical, chart, labs and discussed the procedure including the risks, benefits and alternatives for the proposed anesthesia with the patient or authorized representative who has indicated his/her understanding and acceptance.     Plan Discussed with:   Anesthesia Plan Comments:         Anesthesia Quick Evaluation

## 2013-01-26 NOTE — H&P (Signed)
27 y.o. [id number]  Estimated Date of Delivery: 02/09/13 admitted at [redacted] weeks gestation with SROM.  Prenatal Transfer Tool  Maternal Diabetes: Yes:  Diabetes Type:  Insulin/Medication controlled Genetic Screening: Normal Maternal Ultrasounds/Referrals: Normal Fetal Ultrasounds or other Referrals:  None Maternal Substance Abuse:  No Significant Maternal Medications:  Meds include: Other: Viread for Hep B. Significant Maternal Lab Results: Lab values include: HBsAG positive with mild Liver enzyme elevations. Other Significant Pregnancy Complications:  Poor glucose control.  Afebrile, VSS Heart and Lungs: No active disease Abdomen: soft, gravid, EFW 77%ile at 36 weeks. Cervical exam:  3/70.  Grossly ruptured.  Impression: Early term pregnancy, SROM, poorly controlled GDM on insulin, Chronic Heb B ([place], GI).  Plan:  Admit for delivery.  Patient requested no pitocin on admission.

## 2013-01-26 NOTE — Progress Notes (Signed)
Patient ID: [name], female   DOB: [birth year], 27 y.o.   MRN: [id number]  S: Pt comfortable with epidural O: Filed Vitals:   01/26/13 1530 01/26/13 1600 01/26/13 1631 01/26/13 1700  BP: 130/75 118/50 119/61   Pulse: 94 102 97   Temp:  99 F (37.2 C)    TempSrc:  Oral    Resp: 20 18 18 18   Height:      Weight:       FHT 140-150 reactive cvx 6-7/80/0 toco Q2  A/P 1) Cervical change has been slowly progressing.  Cervix stretched to 6-7 from my 5-6. Patient was 5cm dilated at 8:30 this morning. Concern for CPD given poor progression and adequate labor for several hours.  FWB reassuring therefore will give her a little more time to progress. Discussed possibility of needing to proceed with cesarean with patient.

## 2013-01-27 ENCOUNTER — Encounter (HOSPITAL_COMMUNITY): Payer: Self-pay

## 2013-01-27 LAB — CBC
MCH: 30.2 pg (ref 26.0–34.0)
MCHC: 34.2 g/dL (ref 30.0–36.0)
Platelets: 138 10*3/uL — ABNORMAL LOW (ref 150–400)
RDW: 15.2 % (ref 11.5–15.5)

## 2013-01-27 MED ORDER — SODIUM CHLORIDE 0.9 % IJ SOLN: 3.0000 mL | INTRAMUSCULAR | Status: DC | PRN

## 2013-01-27 MED ORDER — IBUPROFEN 600 MG PO TABS
600.0000 mg | ORAL_TABLET | Freq: Four times a day (QID) | ORAL | Status: DC
  Administered 2013-01-27 (×3): 600 mg via ORAL
  Filled 2013-01-27 (×3): qty 1

## 2013-01-27 MED ORDER — NALBUPHINE SYRINGE 5 MG/0.5 ML
5.0000 mg | INJECTION | INTRAMUSCULAR | Status: DC | PRN
  Filled 2013-01-27: qty 1

## 2013-01-27 MED ORDER — MEPERIDINE HCL 25 MG/ML IJ SOLN
INTRAMUSCULAR | Status: AC
  Filled 2013-01-27: qty 1

## 2013-01-27 MED ORDER — MEPERIDINE HCL 25 MG/ML IJ SOLN: 6.2500 mg | INTRAMUSCULAR | Status: DC | PRN

## 2013-01-27 MED ORDER — WITCH HAZEL-GLYCERIN EX PADS: 1.0000 "application " | MEDICATED_PAD | CUTANEOUS | Status: DC | PRN

## 2013-01-27 MED ORDER — DIPHENHYDRAMINE HCL 25 MG PO CAPS: 25.0000 mg | ORAL_CAPSULE | ORAL | Status: DC | PRN

## 2013-01-27 MED ORDER — LACTATED RINGERS IV SOLN
INTRAVENOUS | Status: DC
  Administered 2013-01-27: 10:00:00 via INTRAVENOUS

## 2013-01-27 MED ORDER — NALOXONE HCL 0.4 MG/ML IJ SOLN: 0.4000 mg | INTRAMUSCULAR | Status: DC | PRN

## 2013-01-27 MED ORDER — ONDANSETRON HCL 4 MG/2ML IJ SOLN
INTRAMUSCULAR | Status: DC | PRN
  Administered 2013-01-27: 4 mg via INTRAVENOUS

## 2013-01-27 MED ORDER — TETANUS-DIPHTH-ACELL PERTUSSIS 5-2.5-18.5 LF-MCG/0.5 IM SUSP
0.5000 mL | Freq: Once | INTRAMUSCULAR | Status: AC
  Administered 2013-01-28: 0.5 mL via INTRAMUSCULAR
  Filled 2013-01-27: qty 0.5

## 2013-01-27 MED ORDER — DIPHENHYDRAMINE HCL 50 MG/ML IJ SOLN: 25.0000 mg | INTRAMUSCULAR | Status: DC | PRN

## 2013-01-27 MED ORDER — DIBUCAINE 1 % RE OINT: 1.0000 "application " | TOPICAL_OINTMENT | RECTAL | Status: DC | PRN

## 2013-01-27 MED ORDER — ONDANSETRON HCL 4 MG/2ML IJ SOLN
4.0000 mg | INTRAMUSCULAR | Status: DC | PRN
  Administered 2013-01-27: 4 mg via INTRAVENOUS

## 2013-01-27 MED ORDER — METHYLERGONOVINE MALEATE 0.2 MG PO TABS: 0.2000 mg | ORAL_TABLET | ORAL | Status: DC | PRN

## 2013-01-27 MED ORDER — SIMETHICONE 80 MG PO CHEW
80.0000 mg | CHEWABLE_TABLET | Freq: Three times a day (TID) | ORAL | Status: DC
  Administered 2013-01-27 (×4): 80 mg via ORAL
  Administered 2013-01-28: 160 mg via ORAL
  Administered 2013-01-28 – 2013-01-30 (×7): 80 mg via ORAL

## 2013-01-27 MED ORDER — SIMETHICONE 80 MG PO CHEW
80.0000 mg | CHEWABLE_TABLET | ORAL | Status: DC | PRN
  Administered 2013-01-28: 80 mg via ORAL

## 2013-01-27 MED ORDER — KETOROLAC TROMETHAMINE 60 MG/2ML IM SOLN
60.0000 mg | Freq: Once | INTRAMUSCULAR | Status: AC | PRN
  Administered 2013-01-27: 60 mg via INTRAMUSCULAR

## 2013-01-27 MED ORDER — PHENYLEPHRINE 40 MCG/ML (10ML) SYRINGE FOR IV PUSH (FOR BLOOD PRESSURE SUPPORT)
PREFILLED_SYRINGE | INTRAVENOUS | Status: AC
  Filled 2013-01-27: qty 5

## 2013-01-27 MED ORDER — MEPERIDINE HCL 25 MG/ML IJ SOLN
INTRAMUSCULAR | Status: DC | PRN
  Administered 2013-01-27 (×4): 6 mg via INTRAVENOUS

## 2013-01-27 MED ORDER — 0.9 % SODIUM CHLORIDE (POUR BTL) OPTIME
TOPICAL | Status: DC | PRN
  Administered 2013-01-27: 1000 mL

## 2013-01-27 MED ORDER — SCOPOLAMINE 1 MG/3DAYS TD PT72
MEDICATED_PATCH | TRANSDERMAL | Status: AC
  Filled 2013-01-27: qty 1

## 2013-01-27 MED ORDER — METHYLERGONOVINE MALEATE 0.2 MG/ML IJ SOLN: 0.2000 mg | INTRAMUSCULAR | Status: DC | PRN

## 2013-01-27 MED ORDER — MORPHINE SULFATE (PF) 0.5 MG/ML IJ SOLN
INTRAMUSCULAR | Status: DC | PRN
  Administered 2013-01-27: 4 mg via EPIDURAL

## 2013-01-27 MED ORDER — KETOROLAC TROMETHAMINE 30 MG/ML IJ SOLN: 30.0000 mg | Freq: Four times a day (QID) | INTRAMUSCULAR | Status: DC | PRN

## 2013-01-27 MED ORDER — LANOLIN HYDROUS EX OINT: 1.0000 "application " | TOPICAL_OINTMENT | CUTANEOUS | Status: DC | PRN

## 2013-01-27 MED ORDER — CEFAZOLIN SODIUM-DEXTROSE 2-3 GM-% IV SOLR
INTRAVENOUS | Status: AC
  Filled 2013-01-27: qty 50

## 2013-01-27 MED ORDER — METOCLOPRAMIDE HCL 5 MG/ML IJ SOLN: 10.0000 mg | Freq: Three times a day (TID) | INTRAMUSCULAR | Status: DC | PRN

## 2013-01-27 MED ORDER — MORPHINE SULFATE (PF) 0.5 MG/ML IJ SOLN
INTRAMUSCULAR | Status: DC | PRN
  Administered 2013-01-27: 1 mg via INTRAVENOUS

## 2013-01-27 MED ORDER — OXYCODONE-ACETAMINOPHEN 5-325 MG PO TABS
1.0000 | ORAL_TABLET | ORAL | Status: DC | PRN
  Administered 2013-01-27 (×2): 1 via ORAL
  Filled 2013-01-27 (×2): qty 1

## 2013-01-27 MED ORDER — DIPHENHYDRAMINE HCL 25 MG PO CAPS
25.0000 mg | ORAL_CAPSULE | Freq: Four times a day (QID) | ORAL | Status: DC | PRN
  Administered 2013-01-27: 25 mg via ORAL
  Filled 2013-01-27: qty 1

## 2013-01-27 MED ORDER — MENTHOL 3 MG MT LOZG: 1.0000 | LOZENGE | OROMUCOSAL | Status: DC | PRN

## 2013-01-27 MED ORDER — PRENATAL MULTIVITAMIN CH
1.0000 | ORAL_TABLET | Freq: Every day | ORAL | Status: DC
  Administered 2013-01-27 – 2013-01-29 (×2): 1 via ORAL
  Filled 2013-01-27 (×2): qty 1

## 2013-01-27 MED ORDER — OXYTOCIN 10 UNIT/ML IJ SOLN
40.0000 [IU] | INTRAVENOUS | Status: DC | PRN
  Administered 2013-01-27: 40 [IU] via INTRAVENOUS

## 2013-01-27 MED ORDER — FENTANYL CITRATE 0.05 MG/ML IJ SOLN: 25.0000 ug | INTRAMUSCULAR | Status: DC | PRN

## 2013-01-27 MED ORDER — ZOLPIDEM TARTRATE 5 MG PO TABS: 5.0000 mg | ORAL_TABLET | Freq: Every evening | ORAL | Status: DC | PRN

## 2013-01-27 MED ORDER — OXYTOCIN 40 UNITS IN LACTATED RINGERS INFUSION - SIMPLE MED: 62.5000 mL/h | INTRAVENOUS | Status: AC

## 2013-01-27 MED ORDER — CHLOROPROCAINE HCL 3 % IJ SOLN
INTRAMUSCULAR | Status: AC
  Filled 2013-01-27: qty 20

## 2013-01-27 MED ORDER — ONDANSETRON HCL 4 MG PO TABS: 4.0000 mg | ORAL_TABLET | ORAL | Status: DC | PRN

## 2013-01-27 MED ORDER — PHENYLEPHRINE HCL 10 MG/ML IJ SOLN
INTRAMUSCULAR | Status: DC | PRN
  Administered 2013-01-27 (×5): 80 ug via INTRAVENOUS

## 2013-01-27 MED ORDER — NALOXONE HCL 1 MG/ML IJ SOLN
1.0000 ug/kg/h | INTRAVENOUS | Status: DC | PRN
  Filled 2013-01-27: qty 2

## 2013-01-27 MED ORDER — SCOPOLAMINE 1 MG/3DAYS TD PT72
1.0000 | MEDICATED_PATCH | Freq: Once | TRANSDERMAL | Status: AC
  Administered 2013-01-27: 1.5 mg via TRANSDERMAL

## 2013-01-27 MED ORDER — ONDANSETRON HCL 4 MG/2ML IJ SOLN
4.0000 mg | Freq: Three times a day (TID) | INTRAMUSCULAR | Status: DC | PRN
  Filled 2013-01-27 (×2): qty 2

## 2013-01-27 MED ORDER — TENOFOVIR DISOPROXIL FUMARATE 300 MG PO TABS
300.0000 mg | ORAL_TABLET | Freq: Every day | ORAL | Status: DC
  Administered 2013-01-27 – 2013-01-30 (×4): 300 mg via ORAL
  Filled 2013-01-27 (×4): qty 1

## 2013-01-27 MED ORDER — DIPHENHYDRAMINE HCL 50 MG/ML IJ SOLN: 12.5000 mg | INTRAMUSCULAR | Status: DC | PRN

## 2013-01-27 MED ORDER — LACTATED RINGERS IV SOLN
INTRAVENOUS | Status: DC | PRN
  Administered 2013-01-27: via INTRAVENOUS

## 2013-01-27 MED ORDER — KETOROLAC TROMETHAMINE 60 MG/2ML IM SOLN
INTRAMUSCULAR | Status: AC
  Filled 2013-01-27: qty 2

## 2013-01-27 MED ORDER — SENNOSIDES-DOCUSATE SODIUM 8.6-50 MG PO TABS
2.0000 | ORAL_TABLET | Freq: Every day | ORAL | Status: DC
  Administered 2013-01-27 – 2013-01-29 (×3): 2 via ORAL

## 2013-01-27 NOTE — Anesthesia Postprocedure Evaluation (Signed)
Anesthesia Post Note  Patient: Kim Pacheco  Procedure(s) Performed: Procedure(s) (LRB): Primary Cesarean Section Delivery Baby  Boy @ [phone], Apgars 4/8/9 (N/A)  Anesthesia type: Epidural  Patient location: Mother/Baby  Post pain: Pain level controlled  Post assessment: Post-op Vital signs reviewed  Last Vitals:  Filed Vitals:   01/27/13 0838  BP: 107/69  Pulse: 82  Temp:   Resp:     Post vital signs: Reviewed  Level of consciousness:alert  Complications: No apparent anesthesia complications

## 2013-01-27 NOTE — Anesthesia Postprocedure Evaluation (Signed)
  Anesthesia Post-op Note  Patient: Kim Pacheco  Procedure(s) Performed: Procedure(s): Primary Cesarean Section Delivery Baby  Boy @ [phone], Apgars 4/8/9 (N/A)  Patient is awake, responsive, moving her legs, and has signs of resolution of her numbness. Pain and nausea are reasonably well controlled. Vital signs are stable and clinically acceptable. Oxygen saturation is clinically acceptable. There are no apparent anesthetic complications at this time. Patient is ready for discharge.

## 2013-01-27 NOTE — Transfer of Care (Signed)
Immediate Anesthesia Transfer of Care Note  Patient: Kim Pacheco  Procedure(s) Performed: Procedure(s): Primary Cesarean Section Delivery Baby  Boy @ [phone], Apgars 4/8/9 (N/A)  Patient Location: PACU  Anesthesia Type:Epidural  Level of Consciousness: awake, alert  and oriented  Airway & Oxygen Therapy: Patient Spontanous Breathing  Post-op Assessment: Report given to PACU RN and Post -op Vital signs reviewed and stable  Post vital signs: Reviewed and stable  Complications: No apparent anesthesia complications

## 2013-01-27 NOTE — Op Note (Signed)
Pre-Operative Diagnosis: 1) 38+1 week Intrauterine Pregnancy 2) Gestational Diabetes on Insulin 3) Failure to Progress 4) Chorioamnionitis Postoperative Diagnosis: Same Procedure: Primary Low Transverse Cesarean Section Surgeon: Dr. Waynard Reeds Assistant: None Operative Findings: Female infant in the left Occiput posterior presentation. Normal appearing ovaries and tubes.  Specimen: Placenta to Pathology EBL: Total I/O In: 1800 [I.V.:1800] Out: 300 [Urine:300]   Procedure:Ms. Kim Pacheco is an 27 year old gravida 6 para 0050 at 29 weeks and 1 days estimated gestational age who presents for cesarean section due to failure to progress. Following the appropriate informed consent the patient was brought to the operating room where epidural anesthesia was administered and found to be adequate. She was placed in the dorsal supine position with a leftward tilt. She was prepped and draped in the normal sterile fashion. Scalpel was then used to make a Pfannenstiel skin incision which was carried down to the underlying layers of soft tissue to the fascia. The fascia was incised in the midline and the fascial incision was extended laterally with Mayo scissors. The superior aspect of the fascial incision was grasped with Coker clamps x2, tented up and the rectus muscles dissected off sharply with the electrocautery unit area and the same procedure was repeated on the inferior aspect of the fascial incision. The rectus muscles were separated in the midline. The abdominal peritoneum was identified, tented up, entered sharply, and the incision was extended superiorly and inferiorly with good visualization of the bladder. The patient was noted to have inadequate pain control at this point in the case and 3% Nesicaine was poured into the abdomen. The Alexis retractor was then deployed. The vesicouterine peritoneum was identified, tented up, entered sharply, and the bladder flap was created digitally. Scalpel was then  used to make a low transverse incision on the uterus which was extended laterally with both blunt dissection and the bandage scissors. The fetal vertex was identified, delivered easily through the uterine incision followed by the body. The infant was bulb suctioned on the operative field cried vigorously, cord was clamped and cut and the infant was passed to the waiting neonatologist. Placenta was then delivered spontaneously, the uterus was cleared of all clot and debris. The uterine incision was repaired with #1 chromic in running locked fashion followed by a second imbricating layer. Ovaries and tubes were inspected and normal. The Alexis retractor was removed. The uterus was returned to the abdominal cavity the abdominal cavity was cleared of all clot and debris. The abdominal peritoneum was reapproximated with 2-0 Vicryl in a running fashion, the rectus muscles was reapproximated with #1 chromic in a running fashion. The fascia was closed with a looped PDS in a running fashion. The skin was closed with 4-0 vicryl in a subcuticular fashion and Dermabond. All sponge lap and needle counts were correct x2. Patient tolerated the procedure well and recovered in stable condition following the procedure.

## 2013-01-27 NOTE — Progress Notes (Signed)
Spoke with Dr Arlyce Dice about Patient's distended abdomen.  We will continue to monitor. No new orders at this time.

## 2013-01-27 NOTE — Consult Note (Signed)
Neonatology Note:   Attendance at C-section:    I was asked by Dr. Tenny Craw to attend this primary C/S at term. The mother is a G6P0A5 O pos, Hep B positive (chronic), GBS neg with poorly controlled GDM on insulin. ROM about 24 hours prior to delivery, fluid clear.The mother had a temp as high as 100.8 during labor and received Ampicillin 1 hour before delivery. Infant had some very thick mucous in the throat at birth, suctioned first by Dr. Tenny Craw on the abdomen, then more removed by [place]; the baby had decreased tone, then became apneic and the HR dropped to below 100 at about 1.5 min of life. He did not respond to vigorous stimulation, so PPV was given for about 1.5 min, with improvement in color, HR, and producing a cry at 4.5 minutes of life. Tone was normal by about 7-8 minutes, at which point he was breathing normally, appeared well-perfused, and had clear breath sounds. Ap 4/8/9. To CN to care of Pediatrician.   Doretha Sou, MD

## 2013-01-27 NOTE — Progress Notes (Signed)
Post Op Day 0.5 Subjective: no complaints  Objective: Blood pressure 107/69, pulse 82, temperature 99.8 F (37.7 C), temperature source Oral, resp. rate 18, height 4\' 11"  (1.499 m), weight 73.483 kg (162 lb), last menstrual period 04/13/2012, SpO2 98.00%, breastfeeding.  Physical Exam:  General: alert Lochia: appropriate Uterine Fundus: firm Incision: no significant drainage   Recent Labs  01/26/13 0144 01/27/13 0550  HGB 11.6* 9.2*  HCT 34.4* 26.9*    Assessment/Plan: Continue post op observation.  Will check glucose and LFTs in am tomorrow.   LOS: 1 day   Mickel Baas 01/27/2013, 9:38 AM

## 2013-01-28 ENCOUNTER — Encounter (HOSPITAL_COMMUNITY): Payer: Self-pay | Admitting: Obstetrics and Gynecology

## 2013-01-28 LAB — COMPREHENSIVE METABOLIC PANEL
ALT: 18 U/L (ref 0–35)
AST: 29 U/L (ref 0–37)
Albumin: 1.8 g/dL — ABNORMAL LOW (ref 3.5–5.2)
Alkaline Phosphatase: 91 U/L (ref 39–117)
Calcium: 8 mg/dL — ABNORMAL LOW (ref 8.4–10.5)
GFR calc Af Amer: >90 mL/min (ref >90)
Potassium: 4 mEq/L (ref 3.5–5.1)
Sodium: 138 mEq/L (ref 135–145)
Total Protein: 4.5 g/dL — ABNORMAL LOW (ref 6.0–8.3)

## 2013-01-28 MED ORDER — BISACODYL 5 MG PO TBEC
5.0000 mg | DELAYED_RELEASE_TABLET | Freq: Every day | ORAL | Status: DC | PRN
  Filled 2013-01-28: qty 1

## 2013-01-28 MED ORDER — OXYCODONE HCL 5 MG PO TABS
5.0000 mg | ORAL_TABLET | ORAL | Status: DC | PRN
  Administered 2013-01-28 – 2013-01-30 (×12): 5 mg via ORAL
  Filled 2013-01-28 (×12): qty 1

## 2013-01-28 MED ORDER — BISACODYL 10 MG RE SUPP
10.0000 mg | Freq: Once | RECTAL | Status: AC
  Administered 2013-01-28: 10 mg via RECTAL
  Filled 2013-01-28: qty 1

## 2013-01-28 MED ORDER — ACETAMINOPHEN 325 MG PO TABS
650.0000 mg | ORAL_TABLET | ORAL | Status: DC | PRN
  Administered 2013-01-29 – 2013-01-30 (×3): 650 mg via ORAL
  Filled 2013-01-28 (×4): qty 2

## 2013-01-28 NOTE — Progress Notes (Signed)
Patient was referred for history of depression/anxiety. * Referral screened out by Clinical Social Worker because none of the following criteria appear to apply:  ~ History of anxiety/depression during this pregnancy, or of post-partum depression.  ~ Diagnosis of anxiety and/or depression within last 6-7 years  ~ History of depression due to pregnancy loss/loss of child  OR * Patient's symptoms currently being treated with medication and/or therapy.  Please contact the Clinical Social Worker if needs arise, or by the patient's request.

## 2013-01-28 NOTE — Progress Notes (Signed)
Called by pharmacy and informed that ibuprofen should not be given with Tenavir (renal damage).  Will DC ibuprofen.

## 2013-01-28 NOTE — Progress Notes (Signed)
Patient requested an abdominal binder, she was fitted with a 12 inch binder and she stated it felt good.

## 2013-01-28 NOTE — Progress Notes (Signed)
POD#1 c/s - FTP, chorioamnionitis and poorly controlled GDM on insulin Patient is eating, ambulating, voiding.  Pain control is good. + Flatus, no BM.  Appropriate lochia.  No complaint.s  Filed Vitals:   01/27/13 1806 01/27/13 2100 01/28/13 0220 01/28/13 0616  BP: 101/66 111/60 100/61 112/53  Pulse: 77 72 75 75  Temp: 99 F (37.2 C) 97.3 F (36.3 C) 98.8 F (37.1 C) 98.2 F (36.8 C)  TempSrc: Oral Oral Oral Oral  Resp: 18 18 18 19   Height:      Weight:      SpO2: 98% 95% 95% 96%    [employer]: c/d/i Ext: no CT  Lab Results  Component Value Date   WBC 11.9* 01/27/2013   HGB 9.2* 01/27/2013   HCT 26.9* 01/27/2013   MCV 88.2 01/27/2013   PLT 138* 01/27/2013    --/--/A POS, A POS (08/19 0144)  A/P Post op day #1 c/s Constipation: colace and increased hydration Desires circ in hospital: Insurance coverage not it place, advised patient to call ins. co.  Will proceed either this afternoon or tomorrow, when issue resolved.  Routine care.    Philip Aspen

## 2013-01-29 MED ORDER — OXYCODONE HCL 5 MG PO TABS: 5.0000 mg | ORAL_TABLET | ORAL | Status: DC | PRN

## 2013-01-29 NOTE — Progress Notes (Signed)
  Patient is eating, ambulating, voiding.  Pain control is good.  Filed Vitals:   01/28/13 0220 01/28/13 0616 01/28/13 1641 01/29/13 0607  BP: 100/61 112/53 135/81 99/54  Pulse: 75 75 105 96  Temp: 98.8 F (37.1 C) 98.2 F (36.8 C) 99.3 F (37.4 C) 98.3 F (36.8 C)  TempSrc: Oral Oral  Oral  Resp: 18 19 18 17   Height:      Weight:      SpO2: 95% 96%  100%    lungs:   clear to auscultation cor:    RRR Abdomen:  soft, appropriate tenderness, incisions intact and without erythema or exudate ex:    no cords   Lab Results  Component Value Date   WBC 11.9* 01/27/2013   HGB 9.2* 01/27/2013   HCT 26.9* 01/27/2013   MCV 88.2 01/27/2013   PLT 138* 01/27/2013    --/--/A POS, A POS (08/19 0144)/RI  A/P    Post operative day 2.  Routine post op and postpartum care.  Expect d/c today.  Percocet for pain control.

## 2013-01-29 NOTE — Discharge Summary (Addendum)
Obstetric Discharge Summary Reason for Admission: onset of labor Prenatal Procedures: none Intrapartum Procedures: cesarean: low cervical, transverse Postpartum Procedures: none Complications-Operative and Postpartum: none Hemoglobin  Date Value Range Status  01/27/2013 9.2* 12.0 - 15.0 g/dL Final     REPEATED TO VERIFY     DELTA CHECK NOTED     HCT  Date Value Range Status  01/27/2013 26.9* 36.0 - 46.0 % Final   Discharge Diagnoses: Term Pregnancy-delivered  Discharge Information: Date: 01/29/2013 Activity: pelvic rest Diet: routine Medications: Percocet Condition: stable Instructions: refer to practice specific booklet Discharge to: home Follow-up Information   Follow up with Almon Hercules., MD In 4 weeks.   Specialty:  Obstetrics and Gynecology   Contact information:   [address] [place] [address] [phone]       Newborn Data: Live born female  Birth Weight: 8 lb 3 oz (3715 g) APGAR: 4, 8  Home with mother.  Kim Pacheco A 01/29/2013, 6:33 AM

## 2013-01-30 NOTE — Progress Notes (Signed)
  Patient is eating, ambulating, voiding.  Pain control is good.  Filed Vitals:   01/28/13 0616 01/28/13 1641 01/29/13 0607 01/29/13 1802  BP: 112/53 135/81 99/54 119/75  Pulse: 75 105 96 92  Temp: 98.2 F (36.8 C) 99.3 F (37.4 C) 98.3 F (36.8 C) 98.3 F (36.8 C)  TempSrc: Oral  Oral Oral  Resp: 19 18 17 18   Height:      Weight:      SpO2: 96%  100%     lungs:   clear to auscultation cor:    RRR Abdomen:  soft, appropriate tenderness, incisions intact and without erythema or exudate ex:    no cords   Lab Results  Component Value Date   WBC 11.9* 01/27/2013   HGB 9.2* 01/27/2013   HCT 26.9* 01/27/2013   MCV 88.2 01/27/2013   PLT 138* 01/27/2013    --/--/A POS, A POS (08/19 0144)/RI  A/P    Post operative day 3.  Routine post op and postpartum care.  Expect d/c today- pt desired to stay one more day yesterday.  Percocet for pain control.

## 2013-02-01 ENCOUNTER — Inpatient Hospital Stay (HOSPITAL_COMMUNITY): Admission: RE | Admit: 2013-02-01 | Payer: BC Managed Care – PPO | Source: Ambulatory Visit

## 2013-02-04 ENCOUNTER — Ambulatory Visit (HOSPITAL_COMMUNITY)
Admission: RE | Admit: 2013-02-04 | Discharge: 2013-02-04 | Disposition: A | Discharge status: Home / Self Care | Payer: BC Managed Care – PPO | Source: Ambulatory Visit | Attending: Obstetrics & Gynecology | Admitting: Obstetrics & Gynecology

## 2013-02-04 NOTE — Lactation Note (Signed)
Adult Lactation Consultation Outpatient Visit Note  Patient Name: Kim Pacheco(MOTHER)     Kim Pacheco(baby) Date of Birth: 06/12/1985                                            DOB: [birth year] Gestational Age at Delivery: [redacted]w[redacted]d                          BIRTH WEIGHT: 8-3 Type of Delivery: C/S                                                DISCHARGE WEIGHT: 7-6.7                                                                                    WEIGHT TODAY:7-8.6 Breastfeeding History: Frequency of Breastfeeding: every 1-2 hours Length of Feeding: 30-60 minutes Voids: qs Stools: qs brown  Supplementing / Method:EBM 90 mls per bottle 2-3 times during night Pumping:  Type of Pump:Medela pump in syle   Frequency:every 2-4 hours  Volume:50-75 mls left   25-50 mls right    Comments:    Consultation Evaluation: Mom and 8 day old infant here for feeding assessment.  Mom was using nipple shield and SNS in hospital due to difficult latch/inverted nipples.  Mom has history of hepatitis B and insulin dependent GDM.  Mom states milk came in 01/31/13 and she stopped using SNS.  Baby has been using a 20 mm nipple shield and mom c/o pain.  Nipple shield increased to a 24 mm and mom more comfortable and latch deeper.  Baby nursed actively on both breasts but only intermittent swallows.  Baby has a short, tight frenulum which is effecting his ability to elevate tongue.  Discussed with mom that this is probably limiting amount of milk transfer to baby.  Recommended she continue to post pump and offer 2-3 oz EBM per bottle.  Scheduled follow up visit for next week to assess milk transfer.  Initial Feeding Assessment:LEFT BREAST 25 MINUTES Pre-feed XLKGMW:1027 Post-feed [id number]:4034 Amount Transferred:20 MLS Comments:  Additional Feeding Assessment:RIGHT BREAST Pre-feed VQQVZD:6387 Post-feed FIEPPI:9518 Amount Transferred:20 mls Comments:  Additional Feeding Assessment: Pre-feed  Weight: Post-feed Weight: Amount Transferred: Comments:  Total Breast milk Transferred this Visit: 40 mls Total Supplement Given: will give at home  Additional Interventions:   Follow-Up SMART START Tuesday:LACTATION OUTPATIENT APPOINTMENT WED. 02/10/13 1030      Hansel Feinstein 02/04/2013, 11:30 AM

## 2013-02-05 ENCOUNTER — Ambulatory Visit (HOSPITAL_COMMUNITY): Payer: BC Managed Care – PPO

## 2013-02-10 ENCOUNTER — Ambulatory Visit (HOSPITAL_COMMUNITY)
Admission: RE | Admit: 2013-02-10 | Discharge: 2013-02-10 | Disposition: A | Discharge status: Home / Self Care | Payer: BC Managed Care – PPO | Source: Ambulatory Visit | Attending: Obstetrics & Gynecology | Admitting: Obstetrics & Gynecology

## 2013-02-10 NOTE — Lactation Note (Signed)
Adult Lactation Consultation Outpatient Visit Note  Patient Name: Kim Pacheco    BABY:  [name] Date of Birth: [birth year]                         DOB: [birth year] Gestational Age at Delivery: [redacted]w[redacted]d       BIRTH WEIGHT: 8-3 Type of Delivery:  C/S                                   DISCHARGE WEIGHT: 7-6                                                                 WEIGHT LC OFFICE 02/04/13: 7-8.6 Breastfeeding History:                         WEIGHT TODAY:  8-0.3 Frequency of Breastfeeding: EVERY 2-3 HOURS Length of Feeding: 10-15 MINUTES Voids: QS Stools: 6-8 YELLOW  Supplementing / Method:ebm/bottle 100-150 mls every 2-3 hours Pumping:  Type of Pump:pump in style   Frequency:every 3 hours  Volume:  100 mls each breast  Comments:    Consultation Evaluation: Mom and 29 week old infant here for feeding assessment.  Baby has gained 8 oz in 6 days.  Mom's milk supply is good but baby still needs a EBM supplementation after breastfeeding due to decreased milk transfer with feedings.  Baby observed this visit obtaining a deep latch with 24 mm nipple shield but unable to empty breast most likely due to tight anterior frenulum.  Baby nurses actively but only able to transfer a total of 35 mls after 35 minutes at breast.  Suggested mom discuss frenulum with [name]'s pediatrician.  If she does decide to get frenulum clipped we can follow up with another feeding assessment.  Initial Feeding Assessment:LEFT BREAST 25 minutes Pre-feed ZOXWRU:0454 Post-feed UJWJXB:1478 Amount Transferred:28 mls Comments:  Additional Feeding Assessment:RIGHT BREAST 10 MINUTES Pre-feed GNFAOZ:3086 Post-feed VHQION:6295 Amount Transferred:4 MLS Comments:  Additional Feeding Assessment: Pre-feed Weight: Post-feed Weight: Amount Transferred: Comments:  Total Breast milk Transferred this Visit: 32 MLS Total Supplement Given: WILL SUPPLEMENT AT HOME  Additional Interventions:   Follow-UpTO  CALL FOR FOLLOW UP      Hansel Feinstein 02/10/2013, 10:33 AM

## 2013-02-26 ENCOUNTER — Ambulatory Visit (HOSPITAL_COMMUNITY)
Admission: RE | Admit: 2013-02-26 | Discharge: 2013-02-26 | Disposition: A | Discharge status: Home / Self Care | Payer: BC Managed Care – PPO | Source: Ambulatory Visit | Attending: Obstetrics & Gynecology | Admitting: Obstetrics & Gynecology

## 2013-02-26 NOTE — Lactation Note (Signed)
Infant Lactation Consultation Outpatient Visit Note  Patient Name: Kim Pacheco                             Baby's name: Kim Pacheco Date of Birth: [birth year]                                                 Baby's DOB: [birth year]                                                                                        Discharge weight: 7lbs. 6 oz Birth Weight:  8 lbs 3 oz                                                Baby's weight today: 9lbs 3.1oz Gestational Age at Delivery: 38w 1d    Type of Delivery:  Cesarean Section  Breastfeeding History Frequency of Breastfeeding:  2-3 X day  Length of Feeding: 20 mins per breast Voids: 6/24 hrs Stools: 6/24 hrs  Supplementing / Method: Pumping:  Type of Pump:  Medela Pump in Style   Frequency: every 4 hrs  Volume:  100-200 ml  Comments: Baby is primarily being bottle fed pumped breast milk at night.  Mom states baby feeds every hour at night, and sleeps long periods of time during the day.  Talked about awakening baby during the day, keeping the light on during the day, and keep lights low at night when feeding.   Consultation Evaluation:  Initial Feeding Assessment: Pre-feed Weight:  4170 gm Post-feed Weight:  4188 gm Amount Transferred: 18 ml Comments: right breast on and off with and without nipple shield 20 mins  Additional Feeding Assessment: Pre-feed Weight:  4188 gm   Post-feed Weight: 4198 gm Amount Transferred: 10 ml Comments:  Left breast 15 min with nipple shield  Total Breast milk Transferred this Visit: 28 ml  Milk transfer is not increased following frenulum clipping.  Not sure if baby is preferring the bottle flow now.  Initiated an SNS at the breast, to determine if the quick flow of the bottle is confusing baby at the breast.  Tried with and without the 24 mm nipple shield, and baby did not transfer any milk from the SNS.  Baby became more and more frantic, so we had to stop trying.  Discussed with Mom  about trying the SNS at home when baby is more calm, and maybe will latch and settle into a pattern.  It was discussed how decreasing the bottle supplement, and increasing feedings at the breast, would be beneficial.  Plan- 1- offer breast as much as you can, during night and day 2- try using 60 ml EBM in SNS at the breast to decrease bottle supplement or offer 2 oz after breast by bottle 3- pump after breast feeding 15-20  mins 4- Wake Kim Pacheco at 3 or 4 hrs during the day to hopefully decrease his wakefulness at night.   Follow-Up  To call us prn    Kim Pacheco 02/26/2013, 10:36 AM

## 2013-02-28 ENCOUNTER — Encounter: Payer: Self-pay | Admitting: Family Medicine

## 2013-02-28 ENCOUNTER — Ambulatory Visit (INDEPENDENT_AMBULATORY_CARE_PROVIDER_SITE_OTHER): Payer: BC Managed Care – PPO | Admitting: Family Medicine

## 2013-02-28 VITALS — BP 108/72 | HR 69 | Temp 98.2°F | Resp 16 | Ht 60.0 in | Wt 138.0 lb

## 2013-02-28 DIAGNOSIS — N644 Mastodynia: Secondary | ICD-10-CM

## 2013-02-28 DIAGNOSIS — Z23 Encounter for immunization: Secondary | ICD-10-CM

## 2013-02-28 DIAGNOSIS — O9122 Nonpurulent mastitis associated with the puerperium: Secondary | ICD-10-CM

## 2013-02-28 MED ORDER — CEPHALEXIN 500 MG PO CAPS: 500.0000 mg | ORAL_CAPSULE | Freq: Four times a day (QID) | ORAL | Status: DC

## 2013-02-28 NOTE — Patient Instructions (Signed)
Keflex - the antibiotic we prescribed you - is safe to use with breastfeeding so keep on breastfeeding and pumping as frequently as you can.  The more you empty your breasts, the more antibiotic will be brought to the area.  Mastitis  Mastitis is a bacterial infection of the breast tissue. CAUSES  Bacteria causes infection by entering the breast tissue through cuts or openings in the skin. Typically, this occurs with breastfeeding due to cracked or irritated skin. It can be associated with plugged ducts. Nipple piercing can also lead to mastitis. SYMPTOMS  In mastitis, an area of the breast becomes swollen, red, tender, and painful. You may notice you have a fever and swelling of the glands under your arm on that side. If the infection is allowed to progress, a collection of pus (abscess) may develop. DIAGNOSIS  Your caregiver can diagnose mastitis based on your symptoms and upon examination. The diagnosis can be confirmed if pus can be expressed from the breast. This pus can be examined in the lab to determine which bacteria are present. If an abscess has developed, the fluid in the abscess can be removed with a needle. This is used to confirm the diagnosis and determine the bacteria present. In most cases, pus will not be present. Blood tests can be done to determine if your body is fighting a bacterial infection. Sometimes, a mammogram or ultrasound will be recommended to exclude other breast diseases including cancer. Other rare forms of mastitis:  Tuberculosis mastitis is rare. The TB germ can affect the breast if it is present in some other part of the body. The breast may be slightly tender with a mass, but not tender or painful.  Syphilis of the nipple usually has an ulcer that is not tender.  Actinomycosis is a very rare bacterial infection of the breast that presents as a mass in the breast that is not tender or painful.  Phlebitis (inflammation of blood vessels) of the breast is an  inflammation of the veins in the breast. It may be caused by tight fitting bras, surgery, or trauma to the breast.  Inflammatory carcinoma of the breast looks like mastitis because the breasts are red, swollen, or tender, but it is a rare form of breast cancer. TREATMENT  Antibiotic medication is used to treat the bacterial infection. Your caregiver will determine which bacteria are most likely to be causing the infection and select an antibiotic. This is sometimes changed based on the results of cultures, or if there is no response to the antibiotic selected. Antibiotics are usually given by mouth. If you are breastfeeding, it is important to continue to empty the breast. Your caregiver can tell you whether or not this milk is safe for your infant, or needs to be thrown away. Pain can usually be treated with medication. HOME CARE INSTRUCTIONS   Take your antibiotics as directed. Finish them even if you start to feel better.  Only take over-the-counter or prescription medication for pain, discomfort, or fever as directed by your caregiver.  If breastfeeding, keep your nipples clean and dry. Your caregiver may tell you to stop nursing until he or she feels it is safe for your baby. Use a breast pump as instructed if forced to stop nursing.  Do not wear a tight bra. Wear a good support bra.  Empty the first breast completely before going to the other breast. If your baby is not emptying your breasts completely for some reason, use a breast pump to  empty your breasts.  If you go back to work, pump your breasts while at work to stay in time with your nursing schedule.  Increase your fluid intake especially if you have a fever.  Avoid having your breasts get overly filled with milk (engorged). SEEK MEDICAL CARE IF:   You develop pus-like (purulent) discharge from the breast.  Your symptoms get worse.  You do not seem to be responding to your treatment within 2 days. SEEK IMMEDIATE MEDICAL  CARE IF:   You have a fever.  Your pain and swelling is getting worse.  You develop pain that is not controlled with medicine.  You develop a red line extending from the breast toward your armpit. Document Released: 05/27/2005 Document Revised: 08/19/2011 Document Reviewed: 01/15/2008 [facility] Patient Information 2014 [place], [place].

## 2013-02-28 NOTE — Progress Notes (Signed)
  Subjective:    Patient ID: Kim Pacheco, female    DOB: 1985/12/09, 27 y.o.   MRN: [id number] Chief Complaint  Patient presents with  . Fever  . Chills  . Dizziness  . Headache  . Breast Pain    started thursday, all other sx started friday    HPI  Feels like she has an infected milk duct - was swollen and tight on thurs, still tight on Friday followed by chills, migraines, achy, fatigue, fever.  1 mo baby, is breastfeeding, has inverted nipples and tight frenulum.  When she pumps looks nml.  Current Outpatient Prescriptions on File Prior to Visit  Medication Sig Dispense Refill  . calcium carbonate (TUMS - DOSED IN MG ELEMENTAL CALCIUM) 500 MG chewable tablet Chew 2 tablets by mouth 3 (three) times daily as needed for heartburn.      . Prenatal Vit-Fe Fumarate-FA (PRENATAL MULTIVITAMIN) TABS Take 1 tablet by mouth daily at 12 noon.      Marland Kitchen tenofovir (VIREAD) 300 MG tablet Take 300 mg by mouth daily.       No current facility-administered medications on file prior to visit.   No Known Allergies   Review of Systems  Constitutional: Positive for fever, chills, diaphoresis, activity change and fatigue. Negative for appetite change.  Cardiovascular: Negative for leg swelling.  Genitourinary: Negative for dysuria, menstrual problem and pelvic pain.  Musculoskeletal: Positive for arthralgias and myalgias.  Neurological: Positive for dizziness, light-headedness and headaches.  Hematological: Negative for adenopathy.  Psychiatric/Behavioral: Positive for sleep disturbance.      BP 108/72  Pulse 69  Temp(Src) 98.2 F (36.8 C)  Resp 16  Ht 5' (1.524 m)  Wt 138 lb (62.596 kg)  BMI 26.95 kg/m2 Objective:   Physical Exam  Constitutional: She is oriented to person, place, and time. She appears well-developed and well-nourished. No distress.  HENT:  Head: Normocephalic and atraumatic.  Right Ear: External ear normal.  Left Ear: External ear normal.  Eyes: Conjunctivae  are normal. No scleral icterus.  Neck: Normal range of motion. Neck supple. No thyromegaly present.  Cardiovascular: Normal rate, regular rhythm, normal heart sounds and intact distal pulses.   Pulmonary/Chest: Effort normal and breath sounds normal. No respiratory distress.  Genitourinary: There is breast swelling and tenderness. No breast discharge or bleeding.  Musculoskeletal: She exhibits no edema.  Lymphadenopathy:    She has no cervical adenopathy.  Neurological: She is alert and oriented to person, place, and time.  Skin: Skin is warm and dry. She is not diaphoretic. No erythema.  Psychiatric: She has a normal mood and affect. Her behavior is normal.          Assessment & Plan:   Breast pain - Plan: CANCELED: POCT CBC  Mastitis, postpartum  Needs flu shot - Plan: Flu Vaccine QUAD 36+ mos IM  Meds ordered this encounter  Medications  . cephALEXin (KEFLEX) 500 MG capsule    Sig: Take 1 capsule (500 mg total) by mouth 4 (four) times daily.    Dispense:  40 capsule    Refill:  0    Norberto Sorenson, MD MPH

## 2013-06-22 ENCOUNTER — Other Ambulatory Visit: Payer: Self-pay | Admitting: Obstetrics & Gynecology

## 2013-12-24 ENCOUNTER — Other Ambulatory Visit: Payer: Self-pay | Admitting: Gastroenterology

## 2013-12-24 DIAGNOSIS — B1911 Unspecified viral hepatitis B with hepatic coma: Secondary | ICD-10-CM

## 2013-12-30 ENCOUNTER — Other Ambulatory Visit: Payer: BC Managed Care – PPO

## 2014-01-17 ENCOUNTER — Ambulatory Visit
Admission: RE | Admit: 2014-01-17 | Discharge: 2014-01-17 | Disposition: A | Discharge status: Home / Self Care | Payer: BC Managed Care – PPO | Source: Ambulatory Visit | Attending: Gastroenterology | Admitting: Gastroenterology

## 2014-01-17 DIAGNOSIS — B1911 Unspecified viral hepatitis B with hepatic coma: Secondary | ICD-10-CM

## 2014-01-21 ENCOUNTER — Ambulatory Visit
Admission: RE | Admit: 2014-01-21 | Discharge: 2014-01-21 | Disposition: A | Discharge status: Home / Self Care | Payer: BC Managed Care – PPO | Source: Ambulatory Visit | Attending: Gastroenterology | Admitting: Gastroenterology

## 2014-06-14 ENCOUNTER — Encounter: Payer: Self-pay | Admitting: Family Medicine

## 2014-06-14 ENCOUNTER — Ambulatory Visit (INDEPENDENT_AMBULATORY_CARE_PROVIDER_SITE_OTHER): Payer: BLUE CROSS/BLUE SHIELD | Admitting: Family Medicine

## 2014-06-14 VITALS — BP 100/72 | HR 89 | Temp 98.0°F | Ht 59.75 in | Wt 128.2 lb

## 2014-06-14 DIAGNOSIS — B1910 Unspecified viral hepatitis B without hepatic coma: Secondary | ICD-10-CM | POA: Insufficient documentation

## 2014-06-14 DIAGNOSIS — Z8632 Personal history of gestational diabetes: Secondary | ICD-10-CM

## 2014-06-14 DIAGNOSIS — Z7189 Other specified counseling: Secondary | ICD-10-CM

## 2014-06-14 DIAGNOSIS — Z2251 Carrier of viral hepatitis B: Secondary | ICD-10-CM

## 2014-06-14 DIAGNOSIS — Z7689 Persons encountering health services in other specified circumstances: Secondary | ICD-10-CM

## 2014-06-14 DIAGNOSIS — K760 Fatty (change of) liver, not elsewhere classified: Secondary | ICD-10-CM

## 2014-06-14 DIAGNOSIS — B181 Chronic viral hepatitis B without delta-agent: Secondary | ICD-10-CM

## 2014-06-14 NOTE — Patient Instructions (Signed)
BEFORE YOU LEAVE: -schedule lab appointment for fasting labs - plenty of water that morning but no food or other drinks  Follow up yearly and as needed  -We have ordered labs or studies at this visit. It can take up to 1-2 weeks for results and processing. We will contact you with instructions IF your results are abnormal. Normal results will be [facility]. If you have not heard from [place] or can not find your results in [facility] in 2 weeks please contact our office.  -PLEASE SIGN UP FOR MYCHART TODAY   We recommend the following healthy lifestyle measures: - eat a healthy diet consisting of lots of vegetables, fruits, beans, nuts, seeds, healthy meats such as white chicken and fish and whole grains.  - avoid fried foods, fast food, processed foods, sodas, red meet and other fattening foods.  - get a least 150 minutes of aerobic exercise per week.

## 2014-06-14 NOTE — Progress Notes (Signed)
Pre visit review using our clinic review tool, if applicable. No additional management support is needed unless otherwise documented below in the visit note. 

## 2014-06-14 NOTE — Progress Notes (Signed)
HPI:  Kim Pacheco is here to establish care.  Last PCP and physical: [facility] ob/gyn for female physicals.  Has the following chronic problems that require follow up and concerns today:  Contraception: -OCP with her gynecologist  Hx of TMJ: -seen in urgent care for this and currently on short course of NSAID  Hx of gestational diabetes: -reports has been fine since her pregnancy -denies: vision changes, polyuria, polydipsia  Hx of depression: -in college, no issues in many years -denies depression, SI  Hepatitis B: -managed by Dr. Kinnie Scales -on viread - followed yearly with [place] and labs - last [place] 01/2014 -reports had labs with Dr. Kinnie Scales this year as well  ROS negative for unless reported above: fevers, unintentional weight loss, hearing or vision loss, chest pain, palpitations, struggling to breath, hemoptysis, melena, hematochezia, hematuria, falls, loc, si, thoughts of self harm  Past Medical History  Diagnosis Date  . Diabetes mellitus without complication     gestational, no issues since pregnancy per her report  . Abnormal Pap smear     normal 06/2013  . Depression     suicide attempt in college  . Hepatitis     B carrier, managed by Dr. Kinnie Scales  . History of chicken pox   . History of UTI     Past Surgical History  Procedure Laterality Date  . Colposcopy    . Cesarean section N/A 01/26/2013    Procedure: Primary Cesarean Section Delivery Baby  Boy @ 0017, Apgars 4/8/9;  Surgeon: Freddrick March. Tenny Craw, MD;  Location: [facility];  Service: Obstetrics;  Laterality: N/A;  . Nasal sinus surgery      Family History  Problem Relation Age of Onset  . Adopted: Yes    History   Social History  . Marital Status: Married    Spouse Name: N/A    Number of Children: N/A  . Years of Education: N/A   Social History Main Topics  . Smoking status: Former Smoker -- 1.00 packs/day    Types: Cigarettes  . Smokeless tobacco: Never Used     Comment: smoked for 8  years, quit in 2013  . Alcohol Use: No  . Drug Use: No  . Sexual Activity: Yes    Birth Control/ Protection: Pill   Other Topics Concern  . 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 Given by Dr Kinnie Scales, Disp: , Rfl:   EXAM:  Filed Vitals:   06/14/14 0826  BP: 100/72  Pulse: 89  Temp: 98 F (36.7 C)    Body mass index is 25.24 kg/(m^2).  GENERAL: vitals reviewed and listed above, alert, oriented, appears well hydrated and in no acute distress  HEENT: atraumatic, conjunttiva clear, no obvious abnormalities on inspection of external nose and ears  NECK: no obvious masses on inspection  LUNGS: clear to auscultation bilaterally, no wheezes, rales or rhonchi, good air movement  CV: HRRR, no peripheral edema  MS: moves all extremities without noticeable abnormality  PSYCH: pleasant and cooperative,  no obvious depression or anxiety  ASSESSMENT AND PLAN:  Discussed the following assessment and plan:  Hepatitis B carrier - Plan: CMP  Steatosis, liver - Plan: Lipid Panel  Encounter to establish care  Hx gestational diabetes - Plan: Hemoglobin A1c   -We reviewed the PMH, PSH, FH, SH, Meds and Allergies. -We provided refills for any medications we will prescribe as needed. -We addressed current concerns per orders and patient instructions. -We have asked for records for pertinent exams, studies, vaccines and notes from previous providers. -We have advised patient to follow up per instructions below. -she is advised to schedule fasting lab  appt -follow up here yearly and as needed  -Patient advised to return or notify a doctor immediately if symptoms worsen or persist or new concerns arise.  Patient Instructions  BEFORE YOU LEAVE: -schedule lab appointment for fasting labs - plenty of water that morning but no food or other drinks  Follow up yearly and as needed  -We have ordered labs or studies at this visit. It can take up to 1-2 weeks for results and processing. We will contact you with instructions IF your results are abnormal. Normal results will be released to your [facility]. If you have not heard from us or can not find your results in [facility] in 2 weeks please contact our office.  -PLEASE SIGN UP FOR MYCHART TODAY   We recommend the following healthy lifestyle measures: - eat a healthy diet consisting of lots of vegetables, fruits, beans, nuts, seeds, healthy meats such as white chicken and fish and whole grains.  - avoid fried foods, fast food, processed foods, sodas, red meet and other fattening foods.  - get a least 150 minutes of aerobic exercise per week.        [name], [name]

## 2014-06-21 ENCOUNTER — Other Ambulatory Visit (INDEPENDENT_AMBULATORY_CARE_PROVIDER_SITE_OTHER): Payer: BLUE CROSS/BLUE SHIELD

## 2014-06-21 ENCOUNTER — Encounter: Payer: Self-pay | Admitting: Family Medicine

## 2014-06-21 ENCOUNTER — Encounter: Payer: Self-pay | Admitting: *Deleted

## 2014-06-21 ENCOUNTER — Ambulatory Visit (INDEPENDENT_AMBULATORY_CARE_PROVIDER_SITE_OTHER): Payer: BLUE CROSS/BLUE SHIELD | Admitting: Family Medicine

## 2014-06-21 VITALS — BP 118/74 | HR 65 | Temp 97.8°F | Ht 59.75 in | Wt 126.3 lb

## 2014-06-21 DIAGNOSIS — Z8619 Personal history of other infectious and parasitic diseases: Secondary | ICD-10-CM

## 2014-06-21 DIAGNOSIS — Z2251 Carrier of viral hepatitis B: Secondary | ICD-10-CM

## 2014-06-21 DIAGNOSIS — K760 Fatty (change of) liver, not elsewhere classified: Secondary | ICD-10-CM

## 2014-06-21 DIAGNOSIS — Z8632 Personal history of gestational diabetes: Secondary | ICD-10-CM

## 2014-06-21 DIAGNOSIS — B181 Chronic viral hepatitis B without delta-agent: Secondary | ICD-10-CM

## 2014-06-21 LAB — COMPREHENSIVE METABOLIC PANEL
ALBUMIN: 4 g/dL (ref 3.5–5.2)
ALK PHOS: 51 U/L (ref 39–117)
ALT: 45 U/L — ABNORMAL HIGH (ref 0–35)
AST: 28 U/L (ref 0–37)
BUN: 14 mg/dL (ref 6–23)
CALCIUM: 9 mg/dL (ref 8.4–10.5)
CHLORIDE: 106 meq/L (ref 96–112)
CO2: 27 meq/L (ref 19–32)
Creatinine, Ser: 0.6 mg/dL (ref 0.4–1.2)
GFR: 115.44 mL/min (ref >60.00)
GLUCOSE: 103 mg/dL — AB (ref 70–99)
POTASSIUM: 5 meq/L (ref 3.5–5.1)
SODIUM: 137 meq/L (ref 135–145)
TOTAL PROTEIN: 7.7 g/dL (ref 6.0–8.3)
Total Bilirubin: 0.6 mg/dL (ref 0.2–1.2)

## 2014-06-21 LAB — LIPID PANEL
CHOL/HDL RATIO: 4
CHOLESTEROL: 178 mg/dL (ref 0–200)
HDL: 46.3 mg/dL (ref >39.00)
LDL Cholesterol: 104 mg/dL — ABNORMAL HIGH (ref 0–99)
NonHDL: 131.7
TRIGLYCERIDES: 140 mg/dL (ref 0.0–149.0)
VLDL: 28 mg/dL (ref 0.0–40.0)

## 2014-06-21 LAB — HEMOGLOBIN A1C: Hgb A1c MFr Bld: 5.9 % (ref 4.6–6.5)

## 2014-06-21 MED ORDER — VALACYCLOVIR HCL 1 G PO TABS
2000.0000 mg | ORAL_TABLET | Freq: Two times a day (BID) | ORAL | Status: DC
Start: 2014-06-21 — End: 2015-08-17

## 2014-06-21 NOTE — Progress Notes (Signed)
Pre visit review using our clinic review tool, if applicable. No additional management support is needed unless otherwise documented below in the visit note. 

## 2014-06-21 NOTE — Patient Instructions (Signed)
Herpes Simplex Herpes simplex is generally classified as Type 1 or Type 2. Type 1 is generally the type that is responsible for cold sores. Type 2 is generally associated with sexually transmitted diseases. We now know that most of the thoughts on these viruses are inaccurate. We find that HSV1 is also present genitally and HSV2 can be present orally, but this will vary in different locations of the world. Herpes simplex is usually detected by doing a culture. Blood tests are also available for this virus; however, the accuracy is often not as good.  PREPARATION FOR TEST No preparation or fasting is necessary. NORMAL FINDINGS  No virus present  No HSV antigens or antibodies present Ranges for normal findings may vary among different laboratories and hospitals. You should always check with your doctor after having lab work or other tests done to discuss the meaning of your test results and whether your values are considered within normal limits. MEANING OF TEST  Your caregiver will go over the test results with you and discuss the importance and meaning of your results, as well as treatment options and the need for additional tests if necessary. OBTAINING THE TEST RESULTS  It is your responsibility to obtain your test results. Ask the lab or department performing the test when and how you will get your results. Document Released: 06/29/2004 Document Revised: 08/19/2011 Document Reviewed: 05/07/2008 Exit[employer] Patient Information 2015 [employer]. This information is not intended to replace advice given to you by your health care provider. Make sure you discuss any questions you have with your health care provider.  

## 2014-06-21 NOTE — Progress Notes (Signed)
HPI:  Acute visit for:  Cold sores: -outbreaks once every few years, but 2nd outbreak this year started yesterday - has tingling of lip then gets crusted ulcer, husband has hsv too -usually uses abreva and resolves -denies fever, weakness of face, malaise  AR: -mild pnd, nasal rhinorrhea, fullness R ear recently  ROS: See pertinent positives and negatives per HPI.  Past Medical History  Diagnosis Date  . Diabetes mellitus without complication     gestational, no issues since pregnancy per her report  . Abnormal Pap smear     normal 06/2013  . Depression     suicide attempt in college  . Hepatitis     B carrier, managed by Dr. Kinnie Scales  . History of chicken pox   . History of UTI     Past Surgical History  Procedure Laterality Date  . Colposcopy    . Cesarean section N/A 01/26/2013    Procedure: Primary Cesarean Section Delivery Baby  Boy @ 0017, Apgars 4/8/9;  Surgeon: Freddrick March. Tenny Craw, MD;  Location: [facility];  Service: Obstetrics;  Laterality: N/A;  . Nasal sinus surgery      Family History  Problem Relation Age of Onset  . Adopted: Yes    History   Social History  . Marital Status: Married    Spouse Name: N/A    Number of Children: N/A  . Years of Education: N/A   Social History Main Topics  . Smoking status: Former Smoker -- 1.00 packs/day    Types: Cigarettes  . Smokeless tobacco: Never Used     Comment: smoked for 8 years, quit in 2013  . Alcohol Use: No  . Drug Use: No  . Sexual Activity: Yes    Birth Control/ Protection: Pill   Other Topics Concern  . 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 Given by Dr Kinnie Scales, Disp: , Rfl: ;  valACYclovir (VALTREX) 1000 MG tablet, Take 2 tablets (2,000 mg total) by mouth 2 (two) times daily., Disp: 4 tablet, Rfl: 3  EXAM:  Filed Vitals:   06/21/14 1414  BP: 118/74  Pulse: 65  Temp: 97.8 F (36.6 C)    Body mass index is 24.86 kg/(m^2).  GENERAL: vitals reviewed and listed above, alert, oriented, appears well hydrated and in no acute distress  HEENT: atraumatic, conjunttiva clear, no obvious abnormalities on inspection of external nose and ears, normal appearance of ear canals and TMs, clear nasal congestion, mild post oropharyngeal erythema with PND, no tonsillar edema or exudate, no sinus TTP   NECK: no obvious masses on inspection  SKIN: very small vesicles lower lip  MS: moves all extremities without noticeable abnormality  PSYCH: pleasant and cooperative, no obvious depression or anxiety  ASSESSMENT AND PLAN:  Discussed the following assessment and plan:  H/O cold sores  -discussed transmission , diagnosis, course and treatment options for cold sores -she opted for oral episodic tx after discussion risks and benefits with valtrex -follow up prn -OTC options for AR  discused -Patient advised to return or notify a doctor immediately if symptoms worsen or persist or new concerns arise.  There are no Patient Instructions on file for this visit.   [name], Emanuel Dowson R.

## 2014-07-18 ENCOUNTER — Other Ambulatory Visit: Payer: Self-pay | Admitting: Obstetrics & Gynecology

## 2014-07-20 LAB — CYTOLOGY - PAP

## 2014-07-29 ENCOUNTER — Encounter: Payer: Self-pay | Admitting: Family Medicine

## 2014-07-29 ENCOUNTER — Ambulatory Visit (INDEPENDENT_AMBULATORY_CARE_PROVIDER_SITE_OTHER): Payer: BLUE CROSS/BLUE SHIELD | Admitting: Family Medicine

## 2014-07-29 VITALS — BP 100/60 | HR 81 | Temp 98.4°F | Ht 59.75 in | Wt 124.1 lb

## 2014-07-29 DIAGNOSIS — K130 Diseases of lips: Secondary | ICD-10-CM

## 2014-07-29 DIAGNOSIS — J069 Acute upper respiratory infection, unspecified: Secondary | ICD-10-CM

## 2014-07-29 NOTE — Progress Notes (Signed)
Pre visit review using our clinic review tool, if applicable. No additional management support is needed unless otherwise documented below in the visit note. 

## 2014-07-29 NOTE — Progress Notes (Signed)
HPI:  Rhinosinusitis: -started: started today -symptoms:nasal congestion, sore throat, cough, sinus pressure -denies:fever, SOB, NVD, tooth pain, sinus -has tried: nothing -sick contacts/travel/risks: no flu exposure, co-workers and husband with viral URI -Hx of: allergies  Chapped lips: -whenever uses flavored chapstick -fine now  ROS: See pertinent positives and negatives per HPI.  Past Medical History  Diagnosis Date  . Diabetes mellitus without complication     gestational, no issues since pregnancy per her report  . Abnormal Pap smear     normal 06/2013  . Depression     suicide attempt in college  . Hepatitis     B carrier, managed by Dr. Kinnie Scales  . History of chicken pox   . History of UTI     Past Surgical History  Procedure Laterality Date  . Colposcopy    . Cesarean section N/A 01/26/2013    Procedure: Primary Cesarean Section Delivery Baby  Boy @ 0017, Apgars 4/8/9;  Surgeon: Freddrick March. Tenny Craw, MD;  Location: [facility];  Service: Obstetrics;  Laterality: N/A;  . Nasal sinus surgery      Family History  Problem Relation Age of Onset  . Adopted: Yes    History   Social History  . Marital Status: Married    Spouse Name: N/A  . Number of Children: N/A  . Years of Education: N/A   Social History Main Topics  . Smoking status: Former Smoker -- 1.00 packs/day    Types: Cigarettes  . Smokeless tobacco: Never Used     Comment: smoked for 8 years, quit in 2013  . Alcohol Use: No  . Drug Use: No  . Sexual Activity: Yes    Birth Control/ Protection: Pill   Other Topics Concern  . 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  PRESCRIPTION MEDICATION, OCP (name unknown), Disp: , Rfl:  .  PRESCRIPTION MEDICATION,  Anti-inflammatory (name unknown), Disp: , Rfl:  .  tenofovir (VIREAD) 300 MG tablet, Take 300 mg by mouth daily. Given by Dr Kinnie Scales, Disp: , Rfl:  .  valACYclovir (VALTREX) 1000 MG tablet, Take 2 tablets (2,000 mg total) by mouth 2 (two) times daily., Disp: 4 tablet, Rfl: 3  EXAM:  Filed Vitals:   07/29/14 1412  BP: 100/60  Pulse: 81  Temp: 98.4 F (36.9 C)    Body mass index is 24.43 kg/(m^2).  GENERAL: vitals reviewed and listed above, alert, oriented, appears well hydrated and in no acute distress  HEENT: atraumatic, conjunttiva clear, no obvious abnormalities on inspection of external nose and ears, normal appearance of ear canals and TMs, clear nasal congestion, mild post oropharyngeal erythema with PND, no tonsillar edema or exudate, lips are normal, no sinus TTP  NECK: no obvious masses on inspection  LUNGS: clear to auscultation bilaterally, no wheezes, rales or rhonchi, good air movement  CV: HRRR, no peripheral edema  MS: moves all extremities without noticeable abnormality  PSYCH: pleasant and cooperative, no obvious depression or anxiety  ASSESSMENT AND PLAN:  Discussed the following assessment and plan:  Acute upper respiratory infection  Chapped lips  -given HPI and exam findings today, a serious infection or illness is unlikely. We  discussed potential etiologies, with VURI being most likely, and advised supportive care and monitoring. We discussed treatment side effects, likely course, antibiotic misuse, transmission, and signs of developing a serious illness. -of course, we advised to return or notify a doctor immediately if symptoms worsen or persist or new concerns arise.    Patient Instructions  INSTRUCTIONS FOR UPPER RESPIRATORY INFECTION:  -plenty of rest and fluids  -nasal saline wash 2-3 times daily (use prepackaged nasal saline or bottled/distilled water if making your own)   -can use AFRIN nasal spray for drainage and nasal congestion - but  do NOT use longer then 3-4 days  -can use tylenol or ibuprofen as directed for aches and sorethroat  -in the winter time, using a humidifier at night is helpful (please follow cleaning instructions)  -if you are taking a cough medication - use only as directed, may also try a teaspoon of honey to coat the throat and throat lozenges  -for sore throat, salt water gargles can help  -follow up if you have fevers, facial pain, tooth pain, difficulty breathing or are worsening or not getting better as expected  For lips use burts bees original or Aquaphor and small amount of hydrocortisone daily for 1 week if chapped      Kim January R.

## 2014-07-29 NOTE — Patient Instructions (Signed)
INSTRUCTIONS FOR UPPER RESPIRATORY INFECTION:  -plenty of rest and fluids  -nasal saline wash 2-3 times daily (use prepackaged nasal saline or bottled/distilled water if making your own)   -can use AFRIN nasal spray for drainage and nasal congestion - but do NOT use longer then 3-4 days  -can use tylenol or ibuprofen as directed for aches and sorethroat  -in the winter time, using a humidifier at night is helpful (please follow cleaning instructions)  -if you are taking a cough medication - use only as directed, may also try a teaspoon of honey to coat the throat and throat lozenges  -for sore throat, salt water gargles can help  -follow up if you have fevers, facial pain, tooth pain, difficulty breathing or are worsening or not getting better as expected  For lips use burts bees original or Aquaphor and small amount of hydrocortisone daily for 1 week if chapped

## 2014-08-05 ENCOUNTER — Ambulatory Visit (INDEPENDENT_AMBULATORY_CARE_PROVIDER_SITE_OTHER): Payer: BLUE CROSS/BLUE SHIELD | Admitting: Family Medicine

## 2014-08-05 ENCOUNTER — Ambulatory Visit (INDEPENDENT_AMBULATORY_CARE_PROVIDER_SITE_OTHER)
Admission: RE | Admit: 2014-08-05 | Discharge: 2014-08-05 | Disposition: A | Discharge status: Home / Self Care | Payer: BLUE CROSS/BLUE SHIELD | Source: Ambulatory Visit | Attending: Family Medicine | Admitting: Family Medicine

## 2014-08-05 ENCOUNTER — Encounter: Payer: Self-pay | Admitting: *Deleted

## 2014-08-05 ENCOUNTER — Encounter: Payer: Self-pay | Admitting: Family Medicine

## 2014-08-05 VITALS — BP 100/74 | HR 98 | Temp 99.1°F | Ht 59.75 in | Wt 122.8 lb

## 2014-08-05 DIAGNOSIS — J029 Acute pharyngitis, unspecified: Secondary | ICD-10-CM

## 2014-08-05 DIAGNOSIS — J069 Acute upper respiratory infection, unspecified: Secondary | ICD-10-CM

## 2014-08-05 LAB — POCT RAPID STREP A (OFFICE): RAPID STREP A SCREEN: NEGATIVE

## 2014-08-05 NOTE — Progress Notes (Signed)
Pre visit review using our clinic review tool, if applicable. No additional management support is needed unless otherwise documented below in the visit note. 

## 2014-08-05 NOTE — Patient Instructions (Signed)
BEFORE YOU LEAVE: -work note - out today -xray information -rapid strep and culture  INSTRUCTIONS FOR UPPER RESPIRATORY INFECTION:  -plenty of rest and fluids  -nasal saline wash 2-3 times daily (use prepackaged nasal saline or bottled/distilled water if making your own)   -can use sinex nasal spray for drainage and nasal congestion - but do NOT use longer then 3-4 days  -can use tylenol or ibuprofen as directed for aches and sorethroat  -in the winter time, using a humidifier at night is helpful (please follow cleaning instructions)  -if you are taking a cough medication - use only as directed, may also try a teaspoon of honey to coat the throat and throat lozenges  -for sore throat, salt water gargles can help  -follow up if you have fevers, facial pain, tooth pain, difficulty breathing or are worsening or not getting better as expectd

## 2014-08-05 NOTE — Progress Notes (Signed)
HPI:  URI -started: 1 week ago -symptoms:nasal congestion, sore throat, cough, chills last night, HA, chest feels tight when breaths deep - she is concerned for pneumonia -denies:subjective fever, SOB, NVD, tooth pain, sinus pain -has tried: nothing - OTC med a few times -sick contacts/travel/risks: denies flu exposure, tick exposure or or Ebola risks -No hx of asthma  ROS: See pertinent positives and negatives per HPI.  Past Medical History  Diagnosis Date  . Diabetes mellitus without complication     gestational, no issues since pregnancy per her report  . Abnormal Pap smear     normal 06/2013  . Depression     suicide attempt in college  . Hepatitis     B carrier, managed by Dr. Kinnie ScalesMedoff  . History of chicken pox   . History of UTI     Past Surgical History  Procedure Laterality Date  . Colposcopy    . Cesarean section N/A 01/26/2013    Procedure: Primary Cesarean Section Delivery Baby  Boy @ 0017, Apgars 4/8/9;  Surgeon: Freddrick MarchKendra H. Tenny Crawoss, MD;  Location: [facility];  Service: Obstetrics;  Laterality: N/A;  . Nasal sinus surgery      Family History  Problem Relation Age of Onset  . Adopted: Yes    History   Social History  . Marital Status: Married    Spouse Name: N/A  . Number of Children: N/A  . Years of Education: N/A   Social History Main Topics  . Smoking status: Former Smoker -- 1.00 packs/day    Types: Cigarettes  . Smokeless tobacco: Never Used     Comment: smoked for 8 years, quit in 2013  . Alcohol Use: No  . Drug Use: No  . Sexual Activity: Yes    Birth Control/ Protection: Pill   Other Topics Concern  . 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  PRESCRIPTION MEDICATION, OCP (name unknown), Disp: , Rfl:  .   PRESCRIPTION MEDICATION, Anti-inflammatory (name unknown), Disp: , Rfl:  .  tenofovir (VIREAD) 300 MG tablet, Take 300 mg by mouth daily. Given by Dr Kinnie ScalesMedoff, Disp: , Rfl:  .  valACYclovir (VALTREX) 1000 MG tablet, Take 2 tablets (2,000 mg total) by mouth 2 (two) times daily., Disp: 4 tablet, Rfl: 3  EXAM:  Filed Vitals:   08/05/14 1048  BP: 100/74  Pulse: 98  Temp: 99.1 F (37.3 C)    Body mass index is 24.17 kg/(m^2).  GENERAL: vitals reviewed and listed above, alert, oriented, appears well hydrated and in no acute distress  HEENT: atraumatic, conjunttiva clear, no obvious abnormalities on inspection of external nose and ears, normal appearance of ear canals and TMs, clear nasal congestion, mild post oropharyngeal erythema with PND, 1+ tondsillar edema with mild exudate, no sinus TTP  NECK: no obvious masses on inspection  LUNGS: clear to auscultation bilaterally, no wheezes, rales or rhonchi, good air movement  CV: HRRR, no peripheral edema  MS: moves all extremities without noticeable abnormality  PSYCH: pleasant and cooperative, no obvious depression or anxiety  ASSESSMENT AND PLAN:  Discussed the following assessment and plan:  Acute upper respiratory infection - Plan: DG Chest 2  View  Sore throat - Plan: POC Rapid Strep A, Throat culture Loney Loh)  -given HPI and exam findings today, a serious infection or illness is unlikely. We discussed potential etiologies, with VURI being most likely, and advised supportive care and monitoring. We discussed treatment side effects, likely course, antibiotic misuse, transmission, and signs of developing a serious illness. -will get strep rapid and culture to r/o strep given exam findings and CXR per her concerns for pna -of course, we advised to return or notify a doctor immediately if symptoms worsen or persist or new concerns arise.    Patient Instructions  BEFORE YOU LEAVE: -work note - out today -xray information -rapid  strep and culture  INSTRUCTIONS FOR UPPER RESPIRATORY INFECTION:  -plenty of rest and fluids  -nasal saline wash 2-3 times daily (use prepackaged nasal saline or bottled/distilled water if making your own)   -can use sinex nasal spray for drainage and nasal congestion - but do NOT use longer then 3-4 days  -can use tylenol or ibuprofen as directed for aches and sorethroat  -in the winter time, using a humidifier at night is helpful (please follow cleaning instructions)  -if you are taking a cough medication - use only as directed, may also try a teaspoon of honey to coat the throat and throat lozenges  -for sore throat, salt water gargles can help  -follow up if you have fevers, facial pain, tooth pain, difficulty breathing or are worsening or not getting better as expectd      [name].

## 2014-08-07 LAB — CULTURE, GROUP A STREP: Organism ID, Bacteria: NORMAL

## 2014-09-12 ENCOUNTER — Telehealth: Payer: Self-pay | Admitting: Family Medicine

## 2014-09-12 NOTE — Telephone Encounter (Signed)
Patient informed. 

## 2014-09-12 NOTE — Telephone Encounter (Signed)
The end of April is actually pretty good and I would advise her to keep that appointment as unlikely they could get her in sooner unless was a very serious medical problem - sometimes can take several months.

## 2014-09-12 NOTE — Telephone Encounter (Signed)
Pt still has rash on her lip and is not able to get an appt with dermatology until the end of April. [facility] tried [facility] and [facility], neither would see her sooner without an urgen referral from you. Pt would like your help in obtaining an appt sooner than end of April or a suggestion for another Derm.

## 2014-11-11 ENCOUNTER — Ambulatory Visit (INDEPENDENT_AMBULATORY_CARE_PROVIDER_SITE_OTHER): Payer: BLUE CROSS/BLUE SHIELD | Admitting: Family Medicine

## 2014-11-11 ENCOUNTER — Encounter: Payer: Self-pay | Admitting: Family Medicine

## 2014-11-11 VITALS — BP 98/70 | HR 75 | Temp 97.9°F | Ht 59.75 in | Wt 120.6 lb

## 2014-11-11 DIAGNOSIS — M2660 Temporomandibular joint disorder, unspecified: Secondary | ICD-10-CM | POA: Diagnosis not present

## 2014-11-11 DIAGNOSIS — H6982 Other specified disorders of Eustachian tube, left ear: Secondary | ICD-10-CM

## 2014-11-11 DIAGNOSIS — M26609 Unspecified temporomandibular joint disorder, unspecified side: Secondary | ICD-10-CM

## 2014-11-11 MED ORDER — MOMETASONE FUROATE 50 MCG/ACT NA SUSP: 2.0000 | Freq: Every day | NASAL | Status: DC

## 2014-11-11 NOTE — Progress Notes (Signed)
HPI:  TMJ: -L ear pain -reports Sept 2015 and went to [facility] and got steroid shot, then went to [facility] about 1 month ago and got shot again -pain started again a few days ago -pain is sharp, moderate around L ear with opening jaw -she has noticed she does clench her jaw at times, does not grind teeth to her knowledge -denies: hearing loss, upper resp symptoms, drainage from the ear, clicking/popping or catching of the jaw, fevers -had dental work yesterday  ROS: See pertinent positives and negatives per HPI.  Past Medical History  Diagnosis Date  . Obesity   . CAD (coronary artery disease)   . Tobacco abuse   . Palpitations   . Anxiety and depression     Past Surgical History  Procedure Laterality Date  . Lumbar disc surgery    . Tubal ligation    . Coronary artery bypass graft      Family History  Problem Relation Age of Onset  . Heart disease Mother   . Diabetes Mother   . Depression Maternal Grandmother   . Depression Maternal Grandfather   . Asthma Son   . Asthma Daughter     History   Social History  . Marital Status: Divorced    Spouse Name: N/A  . Number of Children: N/A  . Years of Education: N/A   Occupational History  . HR     Social History Main Topics  . Smoking status: Former Smoker -- 0.25 packs/day for 20 years    Types: Cigarettes    Quit date: 05/10/2014  . Smokeless tobacco: Not on file  . Alcohol Use: 0.0 oz/week    0 Standard drinks or equivalent per week     Comment: 1 drink here and there  . Drug Use: Not on file  . Sexual Activity: Not on file   Other Topics Concern  . Not on file   Social History Narrative   Work or School:      Home Situation:      Spiritual Beliefs:      Lifestyle:              Current outpatient prescriptions:  .  albuterol (PROVENTIL HFA;VENTOLIN HFA) 108 (90 BASE) MCG/ACT inhaler, Inhale 2 puffs into the lungs every 6 (six) hours as needed for wheezing or shortness of breath., Disp: , Rfl:  .   famotidine (PEPCID) 20 MG tablet, One at bedtime, Disp: 30 tablet, Rfl: 2 .  mometasone (NASONEX) 50 MCG/ACT nasal spray, Place 2 sprays into the nose daily., Disp: 17 g, Rfl: 3 .  pantoprazole (PROTONIX) 40 MG tablet, Take 1 tablet (40 mg total) by mouth daily. Take 30-60 min before first meal of the day, Disp: 30 tablet, Rfl: 2 .  predniSONE (DELTASONE) 10 MG tablet, Take  4 each am x 2 days,   2 each am x 2 days,  1 each am x 2 days and stop, Disp: 14 tablet, Rfl: 0 .  traMADol (ULTRAM) 50 MG tablet, 1-2 every 4 hours as needed for cough or pain, Disp: 40 tablet, Rfl: 0  EXAM:  There were no vitals filed for this visit.  There is no weight on file to calculate BMI.  GENERAL: vitals reviewed and listed above, alert, oriented, appears well hydrated and in no acute distress  HEENT: atraumatic, conjunttiva clear, no obvious abnormalities on inspection of external nose and ears, normal appearance of ear canals and TMs except for clear effusion on L, clear  nasal congestion, mild post oropharyngeal erythema with PND, no tonsillar edema or exudate, no sinus TTP, some mild TTP in muslce of jaw around TMJ on L, mild deviation of jaw with opening on L  NECK: no obvious masses on inspection  LUNGS: clear to auscultation bilaterally, no wheezes, rales or rhonchi, good air movement  CV: HRRR, no peripheral edema  MS: moves all extremities without noticeable abnormality  PSYCH: pleasant and cooperative, no obvious depression or anxiety  ASSESSMENT AND PLAN:  Discussed the following assessment and plan:  TMJ (temporomandibular joint syndrome)  Eustachian tube dysfunction, left - Plan: mometasone (NASONEX) 50 MCG/ACT nasal spray  -Patient advised to return or notify a doctor immediately if symptoms worsen or persist or new concerns arise.  Patient Instructions   BEFORE YOU LEAVE TMJ exercises Follow up in 1 month  AFRIN nasal spray (over the counter) twice daily for 3 days and then  stop  Nasonex 2 sprays each nostril daily for 1 month  Do the jaw exercises at least 4 days per week and avoid chewy meet or very chewy foods  Tylenol 500-1000mg  up to 3 times daily as needed or Aleve per instructions      KIM, Dahlia ClientHANNAH R.

## 2014-11-11 NOTE — Patient Instructions (Signed)
BEFORE YOU LEAVE TMJ exercises Follow up in 1 month  AFRIN nasal spray (over the counter) twice daily for 3 days and then stop  Nasonex 2 sprays each nostril daily for 1 month  Do the jaw exercises at least 4 days per week and avoid chewy meet or very chewy foods  Tylenol 500-1000mg  up to 3 times daily as needed or Aleve per instructions

## 2014-11-11 NOTE — Progress Notes (Signed)
Pre visit review using our clinic review tool, if applicable. No additional management support is needed unless otherwise documented below in the visit note. 

## 2014-11-29 ENCOUNTER — Encounter: Payer: Self-pay | Admitting: Family Medicine

## 2014-11-29 ENCOUNTER — Ambulatory Visit (INDEPENDENT_AMBULATORY_CARE_PROVIDER_SITE_OTHER): Payer: BLUE CROSS/BLUE SHIELD | Admitting: Family Medicine

## 2014-11-29 VITALS — BP 102/64 | HR 79 | Temp 98.5°F | Ht 59.75 in | Wt 118.8 lb

## 2014-11-29 DIAGNOSIS — L600 Ingrowing nail: Secondary | ICD-10-CM | POA: Diagnosis not present

## 2014-11-29 DIAGNOSIS — L03011 Cellulitis of right finger: Secondary | ICD-10-CM | POA: Diagnosis not present

## 2014-11-29 MED ORDER — MUPIROCIN 2 % EX OINT: 1.0000 "application " | TOPICAL_OINTMENT | Freq: Two times a day (BID) | CUTANEOUS | Status: DC

## 2014-11-29 NOTE — Progress Notes (Signed)
Pre visit review using our clinic review tool, if applicable. No additional management support is needed unless otherwise documented below in the visit note. 

## 2014-11-29 NOTE — Progress Notes (Signed)
HPI:  ? Paronychia/ingrown toenail: -started: massed with her toenail yesterday and now has developed pain and swelling -hx of ingrown toenail - R great toe -denies: fevers, injury  ROS: See pertinent positives and negatives per HPI.  Past Medical History  Diagnosis Date  . Diabetes mellitus without complication     gestational, no issues since pregnancy per her report  . Abnormal Pap smear     normal 06/2013  . Depression     suicide attempt in college  . Hepatitis     B carrier, managed by Dr. Kinnie Scales  . History of chicken pox   . History of UTI     Past Surgical History  Procedure Laterality Date  . Colposcopy    . Cesarean section N/A 01/26/2013    Procedure: Primary Cesarean Section Delivery Baby  Boy @ 0017, Apgars 4/8/9;  Surgeon: Freddrick March. Tenny Craw, MD;  Location: [facility];  Service: Obstetrics;  Laterality: N/A;  . Nasal sinus surgery      Family History  Problem Relation Age of Onset  . Adopted: Yes    History   Social History  . Marital Status: Married    Spouse Name: N/A  . Number of Children: N/A  . Years of Education: N/A   Social History Main Topics  . Smoking status: Former Smoker -- 1.00 packs/day    Types: Cigarettes  . Smokeless tobacco: Never Used     Comment: smoked for 8 years, quit in 2013  . Alcohol Use: No  . Drug Use: No  . Sexual Activity: Yes    Birth Control/ Protection: Pill   Other Topics Concern  . 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  mometasone (NASONEX) 50 MCG/ACT nasal spray, Place 2 sprays into the nose daily., Disp: 17 g, Rfl: 2 .  PRESCRIPTION MEDICATION, OCP (name unknown), Disp: , Rfl:  .  PRESCRIPTION MEDICATION, Anti-inflammatory (name unknown), Disp: , Rfl:  .  tenofovir  (VIREAD) 300 MG tablet, Take 300 mg by mouth daily. Given by Dr Kinnie Scales, Disp: , Rfl:  .  valACYclovir (VALTREX) 1000 MG tablet, Take 2 tablets (2,000 mg total) by mouth 2 (two) times daily., Disp: 4 tablet, Rfl: 3 .  mupirocin ointment (BACTROBAN) 2 %, Place 1 application into the nose 2 (two) times daily., Disp: 22 g, Rfl: 0  EXAM:  Filed Vitals:   11/29/14 1054  BP: 102/64  Pulse: 79  Temp: 98.5 F (36.9 C)    Body mass index is 23.39 kg/(m^2).  GENERAL: vitals reviewed and listed above, alert, oriented, appears well hydrated and in no acute distress  HEENT: atraumatic, conjunttiva clear, no obvious abnormalities on inspection of external nose and ears  NECK: no obvious masses on inspection  SKIN: paronychia and mildly ingrown nail R great toe, no abscess formation  MS: moves all extremities without noticeable abnormality  PSYCH: pleasant and cooperative, no obvious depression or anxiety  ASSESSMENT AND PLAN:  Discussed the following assessment and plan:  Paronychia, right - Plan: mupirocin ointment (BACTROBAN) 2 %  Ingrown nail   -tx with soaks and abx ointment -discussed options for lateral nail removal and she may see derm  for this or call for podiatry referral -Patient advised to return or notify a doctor immediately if symptoms worsen or persist or new concerns arise.  There are no Patient Instructions on file for this visit.   [name].

## 2014-11-29 NOTE — Patient Instructions (Signed)

## 2014-12-15 ENCOUNTER — Encounter: Payer: Self-pay | Admitting: Family Medicine

## 2014-12-15 ENCOUNTER — Ambulatory Visit (INDEPENDENT_AMBULATORY_CARE_PROVIDER_SITE_OTHER): Payer: BLUE CROSS/BLUE SHIELD | Admitting: Family Medicine

## 2014-12-15 VITALS — BP 98/70 | HR 76 | Temp 98.5°F | Ht 59.75 in | Wt 119.6 lb

## 2014-12-15 DIAGNOSIS — K088 Other specified disorders of teeth and supporting structures: Secondary | ICD-10-CM

## 2014-12-15 DIAGNOSIS — K0889 Other specified disorders of teeth and supporting structures: Secondary | ICD-10-CM

## 2014-12-15 NOTE — Patient Instructions (Addendum)
-  call your dentist about the tooth pain  -good oral hygeine and crest pro-health mouthwash for a few days and regular flossing

## 2014-12-15 NOTE — Progress Notes (Signed)
Pre visit review using our clinic review tool, if applicable. No additional management support is needed unless otherwise documented below in the visit note. 

## 2014-12-15 NOTE — Progress Notes (Signed)
HPI:  L lower tooth pain -L sided tooth pain back molar, started yesterday -feels like gum is irritated -denies: hearing loss, upper resp symptoms, drainage from the ear, clicking/popping or catching of the jaw, fevers, sensitivity to hot or cold food, inability to chew or eat but hurts if touches this tooth  ROS: See pertinent positives and negatives per HPI.  Past Medical History  Diagnosis Date  . Diabetes mellitus without complication     gestational, no issues since pregnancy per her report  . Abnormal Pap smear     normal 06/2013  . Depression     suicide attempt in college  . Hepatitis     B carrier, managed by Dr. Kinnie Scales  . History of chicken pox   . History of UTI     Past Surgical History  Procedure Laterality Date  . Colposcopy    . Cesarean section N/A 01/26/2013    Procedure: Primary Cesarean Section Delivery Baby  Boy @ 0017, Apgars 4/8/9;  Surgeon: Freddrick March. Tenny Craw, MD;  Location: [facility];  Service: Obstetrics;  Laterality: N/A;  . Nasal sinus surgery      Family History  Problem Relation Age of Onset  . Adopted: Yes    History   Social History  . Marital Status: Married    Spouse Name: N/A  . Number of Children: N/A  . Years of Education: N/A   Social History Main Topics  . Smoking status: Former Smoker -- 1.00 packs/day    Types: Cigarettes  . Smokeless tobacco: Never Used     Comment: smoked for 8 years, quit in 2013  . Alcohol Use: No  . Drug Use: No  . Sexual Activity: Yes    Birth Control/ Protection: Pill   Other Topics Concern  . 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  mometasone (NASONEX) 50 MCG/ACT nasal spray, Place 2 sprays into the nose daily., Disp: 17 g, Rfl: 2 .  mupirocin ointment  (BACTROBAN) 2 %, Place 1 application into the nose 2 (two) times daily., Disp: 22 g, Rfl: 0 .  PRESCRIPTION MEDICATION, OCP (name unknown), Disp: , Rfl:  .  PRESCRIPTION MEDICATION, Anti-inflammatory (name unknown), Disp: , Rfl:  .  tenofovir (VIREAD) 300 MG tablet, Take 300 mg by mouth daily. Given by Dr Kinnie Scales, Disp: , Rfl:  .  valACYclovir (VALTREX) 1000 MG tablet, Take 2 tablets (2,000 mg total) by mouth 2 (two) times daily., Disp: 4 tablet, Rfl: 3  EXAM:  Filed Vitals:   12/15/14 1053  BP: 98/70  Pulse: 76  Temp: 98.5 F (36.9 C)    Body mass index is 23.54 kg/(m^2).  GENERAL: vitals reviewed and listed above, alert, oriented, appears well hydrated and in no acute distress  HEENT: atraumatic, conjunttiva clear, no obvious abnormalities on inspection of external nose and ears, on inpection of oropharynx some irritation and mild redness of gums around L lower back molar, filling in this molar, tenderness when tapping on this tooth, no other lesions or abnormality, no LAD  NECK: no obvious masses on inspection  LUNGS: clear to auscultation bilaterally, no wheezes, rales or rhonchi, good air movement  CV: HRRR,  no peripheral edema  MS: moves all extremities without noticeable abnormality  PSYCH: pleasant and cooperative, no obvious depression or anxiety  ASSESSMENT AND PLAN:  Discussed the following assessment and plan:  Tooth pain  -suspect gingivitis, advised mouthwash for several days, advised her to call her dentist today for appt to eval for dental abscess or filling issue, less likely -Patient advised to return or notify a doctor immediately if symptoms worsen or persist or new concerns arise.  There are no Patient Instructions on file for this visit.   [name], [name]

## 2015-01-11 ENCOUNTER — Telehealth: Payer: Self-pay | Admitting: Family Medicine

## 2015-01-11 ENCOUNTER — Encounter: Payer: Self-pay | Admitting: Family Medicine

## 2015-01-11 ENCOUNTER — Ambulatory Visit (INDEPENDENT_AMBULATORY_CARE_PROVIDER_SITE_OTHER): Payer: BLUE CROSS/BLUE SHIELD | Admitting: Family Medicine

## 2015-01-11 VITALS — BP 100/64 | HR 84 | Temp 98.2°F | Wt 117.0 lb

## 2015-01-11 DIAGNOSIS — R197 Diarrhea, unspecified: Secondary | ICD-10-CM | POA: Diagnosis not present

## 2015-01-11 NOTE — Patient Instructions (Signed)
Food Choices to Help Relieve Diarrhea °When you have diarrhea, the foods you eat and your eating habits are very important. Choosing the right foods and drinks can help relieve diarrhea. Also, because diarrhea can last up to 7 days, you need to replace lost fluids and electrolytes (such as sodium, potassium, and chloride) in order to help prevent dehydration.  °WHAT GENERAL GUIDELINES DO I NEED TO FOLLOW? °· Slowly drink 1 cup (8 oz) of fluid for each episode of diarrhea. If you are getting enough fluid, your urine will be clear or pale yellow. °· Eat starchy foods. Some good choices include white rice, white toast, pasta, low-fiber cereal, baked potatoes (without the skin), saltine crackers, and bagels. °· Avoid large servings of any cooked vegetables. °· Limit fruit to two servings per day. A serving is ½ cup or 1 small piece. °· Choose foods with less than 2 g of fiber per serving. °· Limit fats to less than 8 tsp (38 g) per day. °· Avoid fried foods. °· Eat foods that have probiotics in them. Probiotics can be found in certain dairy products. °· Avoid foods and beverages that may increase the speed at which food moves through the stomach and intestines (gastrointestinal tract). Things to avoid include: °¨ High-fiber foods, such as dried fruit, raw fruits and vegetables, nuts, seeds, and whole grain foods. °¨ Spicy foods and high-fat foods. °¨ Foods and beverages sweetened with high-fructose corn syrup, honey, or sugar alcohols such as xylitol, sorbitol, and mannitol. °WHAT FOODS ARE RECOMMENDED? °Grains °White rice. White, French, or pita breads (fresh or toasted), including plain rolls, buns, or bagels. White pasta. Saltine, soda, or graham crackers. Pretzels. Low-fiber cereal. Cooked cereals made with water (such as cornmeal, farina, or cream cereals). Plain muffins. Matzo. Melba toast. Zwieback.  °Vegetables °Potatoes (without the skin). Strained tomato and vegetable juices. Most well-cooked and canned  vegetables without seeds. Tender lettuce. °Fruits °Cooked or canned applesauce, apricots, cherries, fruit cocktail, grapefruit, peaches, pears, or plums. Fresh bananas, apples without skin, cherries, grapes, cantaloupe, grapefruit, peaches, oranges, or plums.  °Meat and Other Protein Products °Baked or boiled chicken. Eggs. Tofu. Fish. Seafood. Smooth peanut butter. Ground or well-cooked tender beef, ham, veal, lamb, pork, or poultry.  °Dairy °Plain yogurt, kefir, and unsweetened liquid yogurt. Lactose-free milk, buttermilk, or soy milk. Plain hard cheese. °Beverages °Sport drinks. Clear broths. Diluted fruit juices (except prune). Regular, caffeine-free sodas such as ginger ale. Water. Decaffeinated teas. Oral rehydration solutions. Sugar-free beverages not sweetened with sugar alcohols. °Other °Bouillon, broth, or soups made from recommended foods.  °The items listed above may not be a complete list of recommended foods or beverages. Contact your dietitian for more options. °WHAT FOODS ARE NOT RECOMMENDED? °Grains °Whole grain, whole wheat, bran, or rye breads, rolls, pastas, crackers, and cereals. Wild or brown rice. Cereals that contain more than 2 g of fiber per serving. Corn tortillas or taco shells. Cooked or dry oatmeal. Granola. Popcorn. °Vegetables °Raw vegetables. Cabbage, broccoli, Brussels sprouts, artichokes, baked beans, beet greens, corn, kale, legumes, peas, sweet potatoes, and yams. Potato skins. Cooked spinach and cabbage. °Fruits °Dried fruit, including raisins and dates. Raw fruits. Stewed or dried prunes. Fresh apples with skin, apricots, mangoes, pears, raspberries, and strawberries.  °Meat and Other Protein Products °Chunky peanut butter. Nuts and seeds. Beans and lentils. Bacon.  °Dairy °High-fat cheeses. Milk, chocolate milk, and beverages made with milk, such as milk shakes. Cream. Ice cream. °Sweets and Desserts °Sweet rolls, doughnuts, and sweet breads. Pancakes   and waffles. °Fats and  Oils °Butter. Cream sauces. Margarine. Salad oils. Plain salad dressings. Olives. Avocados.  °Beverages °Caffeinated beverages (such as coffee, tea, soda, or energy drinks). Alcoholic beverages. Fruit juices with pulp. Prune juice. Soft drinks sweetened with high-fructose corn syrup or sugar alcohols. °Other °Coconut. Hot sauce. Chili powder. Mayonnaise. Gravy. Cream-based or milk-based soups.  °The items listed above may not be a complete list of foods and beverages to avoid. Contact your dietitian for more information. °WHAT SHOULD I DO IF I BECOME DEHYDRATED? °Diarrhea can sometimes lead to dehydration. Signs of dehydration include dark urine and dry mouth and skin. If you think you are dehydrated, you should rehydrate with an oral rehydration solution. These solutions can be purchased at pharmacies, retail stores, or online.  °Drink ½-1 cup (120-240 mL) of oral rehydration solution each time you have an episode of diarrhea. If drinking this amount makes your diarrhea worse, try drinking smaller amounts more often. For example, drink 1-3 tsp (5-15 mL) every 5-10 minutes.  °A general rule for staying hydrated is to drink 1½-2 L of fluid per day. Talk to your health care provider about the specific amount you should be drinking each day. Drink enough fluids to keep your urine clear or pale yellow. °Document Released: 08/17/2003 Document Revised: 06/01/2013 Document Reviewed: 04/19/2013 °ExitCare® Patient Information ©2015 [employer]. This information is not intended to replace advice given to you by your health care provider. Make sure you discuss any questions you have with your health care provider. °Diarrhea °Diarrhea is frequent loose and watery bowel movements. It can cause you to feel weak and dehydrated. Dehydration can cause you to become tired and thirsty, have a dry mouth, and have decreased urination that often is dark yellow. Diarrhea is a sign of another problem, most often an infection that will  not last long. In most cases, diarrhea typically lasts 2-3 days. However, it can last longer if it is a sign of something more serious. It is important to treat your diarrhea as directed by your caregiver to lessen or prevent future episodes of diarrhea. °CAUSES  °Some common causes include: °· Gastrointestinal infections caused by viruses, bacteria, or parasites. °· Food poisoning or food allergies. °· Certain medicines, such as antibiotics, chemotherapy, and laxatives. °· Artificial sweeteners and fructose. °· Digestive disorders. °HOME CARE INSTRUCTIONS °· Ensure adequate fluid intake (hydration): Have 1 cup (8 oz) of fluid for each diarrhea episode. Avoid fluids that contain simple sugars or sports drinks, fruit juices, whole milk products, and sodas. Your urine should be clear or pale yellow if you are drinking enough fluids. Hydrate with an oral rehydration solution that you can purchase at pharmacies, retail stores, and online. You can prepare an oral rehydration solution at home by mixing the following ingredients together: °¨  - tsp table salt. °¨ ¾ tsp baking soda. °¨  tsp salt substitute containing potassium chloride. °¨ 1  tablespoons sugar. °¨ 1 L (34 oz) of water. °· Certain foods and beverages may increase the speed at which food moves through the gastrointestinal (GI) tract. These foods and beverages should be avoided and include: °¨ Caffeinated and alcoholic beverages. °¨ High-fiber foods, such as raw fruits and vegetables, nuts, seeds, and whole grain breads and cereals. °¨ Foods and beverages sweetened with sugar alcohols, such as xylitol, sorbitol, and mannitol. °· Some foods may be well tolerated and may help thicken stool including: °¨ Starchy foods, such as rice, toast, pasta, low-sugar cereal, oatmeal, grits, baked potatoes,   crackers, and bagels. °¨ Bananas. °¨ Applesauce. °· Add probiotic-rich foods to help increase healthy bacteria in the GI tract, such as yogurt and fermented milk  products. °· Wash your hands well after each diarrhea episode. °· Only take over-the-counter or prescription medicines as directed by your caregiver. °· Take a warm bath to relieve any burning or pain from frequent diarrhea episodes. °SEEK IMMEDIATE MEDICAL CARE IF:  °· You are unable to keep fluids down. °· You have persistent vomiting. °· You have blood in your stool, or your stools are black and tarry. °· You do not urinate in 6-8 hours, or there is only a small amount of very dark urine. °· You have abdominal pain that increases or localizes. °· You have weakness, dizziness, confusion, or light-headedness. °· You have a severe headache. °· Your diarrhea gets worse or does not get better. °· You have a fever or persistent symptoms for more than 2-3 days. °· You have a fever and your symptoms suddenly get worse. °MAKE SURE YOU:  °· Understand these instructions. °· Will watch your condition. °· Will get help right away if you are not doing well or get worse. °Document Released: 05/17/2002 Document Revised: 10/11/2013 Document Reviewed: 02/02/2012 °ExitCare® Patient Information ©2015 [employer]. This information is not intended to replace advice given to you by your health care provider. Make sure you discuss any questions you have with your health care provider. ° °

## 2015-01-11 NOTE — Progress Notes (Signed)
   Subjective:    Patient ID: Kim Pacheco, female    DOB: 02/01/1986, 29 y.o.   MRN: [id number]  HPI  Acute visit. Patient seen with onset about exactly 1 week ago of nonbloody diarrhea. Stools are very loose. 2-3 per day. She has occasional lower abdominal cramping but good appetite. She has some mild nausea but no vomiting and no recent travels. No recent antibiotic. She is not taking any new medications. Keeping down fluids well. No exacerbating or alleviating factors. She has not tried any Imodium. She has not had any history of any chronic diarrhea  Past Medical History  Diagnosis Date  . Diabetes mellitus without complication     gestational, no issues since pregnancy per her report  . Abnormal Pap smear     normal 06/2013  . Depression     suicide attempt in college  . Hepatitis     B carrier, managed by Dr. Kinnie Scales  . History of chicken pox   . History of UTI    Past Surgical History  Procedure Laterality Date  . Colposcopy    . Cesarean section N/A 01/26/2013    Procedure: Primary Cesarean Section Delivery Baby  Boy @ 0017, Apgars 4/8/9;  Surgeon: Freddrick March. Tenny Craw, MD;  Location: [facility];  Service: Obstetrics;  Laterality: N/A;  . Nasal sinus surgery      reports that she has quit smoking. Her smoking use included Cigarettes. She smoked 1.00 pack per day. She has never used smokeless tobacco. She reports that she does not drink alcohol or use illicit drugs. family history is not on file. She was adopted. No Known Allergies   Review of Systems  Constitutional: Negative for fever, chills, appetite change and unexpected weight change.  Respiratory: Negative for shortness of breath.   Cardiovascular: Negative for chest pain.  Gastrointestinal: Positive for diarrhea. Negative for vomiting, blood in stool and abdominal distention.  Neurological: Negative for dizziness.       Objective:   Physical Exam  Constitutional: She appears well-developed and well-nourished.    HENT:  Mouth/Throat: Oropharynx is clear and moist.  Neck: Neck supple.  Cardiovascular: Normal rate and regular rhythm.   Pulmonary/Chest: Effort normal and breath sounds normal. No respiratory distress. She has no wheezes. She has no rales.  Abdominal: Soft. Bowel sounds are normal. She exhibits no distension and no mass. There is no tenderness. There is no rebound and no guarding.  Lymphadenopathy:    She has no cervical adenopathy.          Assessment & Plan:  Diarrhea. Suspect viral origin. Patient is not dehydrated and symptoms are relatively mild with nonfocal exam. We discussed dietary factors. Consider short-term use over-the-counter Imodium. Touch base if symptoms not resolving by next week

## 2015-01-11 NOTE — Progress Notes (Signed)
Pre visit review using our clinic review tool, if applicable. No additional management support is needed unless otherwise documented below in the visit note. 

## 2015-01-11 NOTE — Telephone Encounter (Signed)
[facility] - Client TELEPHONE ADVICE RECORD [facility] Patient Name: Kim Pacheco DOB: 29-Feb-1986 Initial Comment Caller states c/o diarrhea x 1 wk Nurse Assessment Nurse: Lane Hacker, RN, Windy Date/Time (Eastern Time): 01/11/2015 10:40:48 AM Confirm and document reason for call. If symptomatic, describe symptoms. ---Caller states c/o watery to loose diarrhea x 1 wk with 2-3 episodes in last 24 hours. It was black and tarry but not any more. Last Friday was last time it was black. No fever or vomiting. Denies abdominal pain until right before she has the diarrhea. Has the patient traveled out of the country within the last 30 days? ---No Does the patient require triage? ---Yes Related visit to physician within the last 2 weeks? ---No Does the PT have any chronic conditions? (i.e. diabetes, asthma, etc.) ---Yes List chronic conditions. ---Hepatitis B, and on OCP Did the patient indicate they were pregnant? ---No Guidelines Guideline Title Affirmed Question Affirmed Notes Diarrhea Weak immune system (e.g., HIV positive, cancer chemo, splenectomy, organ transplant, chronic steroids) She has Hepatitis B, concerned about report of having black stool previous. Final Disposition User See Physician within [address] [place], [place], [place] Comments Appt made for today with Dr. Caryl Never at 3:30 pm. Referrals REFERRED TO PCP OFFICE Disagree/Comply: Comply

## 2015-01-31 ENCOUNTER — Other Ambulatory Visit: Payer: Self-pay | Admitting: Gastroenterology

## 2015-01-31 DIAGNOSIS — B181 Chronic viral hepatitis B without delta-agent: Secondary | ICD-10-CM

## 2015-02-03 ENCOUNTER — Ambulatory Visit
Admission: RE | Admit: 2015-02-03 | Discharge: 2015-02-03 | Disposition: A | Discharge status: Home / Self Care | Payer: BLUE CROSS/BLUE SHIELD | Source: Ambulatory Visit | Attending: Gastroenterology | Admitting: Gastroenterology

## 2015-02-03 DIAGNOSIS — B181 Chronic viral hepatitis B without delta-agent: Secondary | ICD-10-CM

## 2015-06-27 ENCOUNTER — Ambulatory Visit (INDEPENDENT_AMBULATORY_CARE_PROVIDER_SITE_OTHER): Payer: BLUE CROSS/BLUE SHIELD | Admitting: Family Medicine

## 2015-06-27 ENCOUNTER — Encounter: Payer: Self-pay | Admitting: Family Medicine

## 2015-06-27 VITALS — BP 98/68 | HR 95 | Temp 98.5°F | Ht 59.75 in | Wt 119.4 lb

## 2015-06-27 DIAGNOSIS — Z23 Encounter for immunization: Secondary | ICD-10-CM

## 2015-06-27 DIAGNOSIS — R059 Cough, unspecified: Secondary | ICD-10-CM

## 2015-06-27 DIAGNOSIS — R05 Cough: Secondary | ICD-10-CM

## 2015-06-27 MED ORDER — BENZONATATE 100 MG PO CAPS: 100.0000 mg | ORAL_CAPSULE | Freq: Two times a day (BID) | ORAL | Status: DC | PRN

## 2015-06-27 NOTE — Progress Notes (Signed)
Pre visit review using our clinic review tool, if applicable. No additional management support is needed unless otherwise documented below in the visit note. 

## 2015-06-27 NOTE — Patient Instructions (Signed)
BEFORE YOU LEAVE: -flu shot  INSTRUCTIONS FOR UPPER RESPIRATORY INFECTION:  -plenty of rest and fluids  -nasal saline wash 2-3 times daily (use prepackaged nasal saline or bottled/distilled water if making your own)   -in the winter time, using a humidifier at night is helpful (please follow cleaning instructions)  -if you are taking a cough medication - use only as directed, may also try a teaspoon of honey to coat the throat and throat lozenges. If given a cough medication with codeine or hydrocodone or other narcotic please be advised that this contains a strong and  potentially addicting medication. Please follow instructions carefully, take as little as possible and only use AS NEEDED for severe cough. Discuss potential side effects with your pharmacy. Please do not drive or operate machinery while taking these types of medications. Please do not take other sedating medications, drugs or alcohol while taking this medication without discussing with your doctor.  -for sore throat, salt water gargles can help  -follow up if you have fevers, facial pain, tooth pain, difficulty breathing or are worsening or symptoms persist longer then expected  Upper Respiratory Infection, Adult An upper respiratory infection (URI) is also known as the common cold. It is often caused by a type of germ (virus). Colds are easily spread (contagious). You can pass it to others by kissing, coughing, sneezing, or drinking out of the same glass. Usually, you get better in 1 to 3  weeks.  However, the cough can last for even longer. HOME CARE   Only take medicine as told by your doctor. Follow instructions provided above.  Drink enough water and fluids to keep your pee (urine) clear or pale yellow.  Get plenty of rest.  Return to work when your temperature is < 100 for 24 hours or as told by your doctor. You may use a face mask and wash your hands to stop your cold from spreading. GET HELP RIGHT AWAY IF:    After the first few days, you feel you are getting worse.  You have questions about your medicine.  You have chills, shortness of breath, or red spit (mucus).  You have pain in the face for more then 1-2 days, especially when you bend forward.  You have a fever, puffy (swollen) neck, pain when you swallow, or white spots in the back of your throat.  You have a bad headache, ear pain, sinus pain, or chest pain.  You have a high-pitched whistling sound when you breathe in and out (wheezing).  You cough up blood.  You have sore muscles or a stiff neck. MAKE SURE YOU:   Understand these instructions.  Will watch your condition.  Will get help right away if you are not doing well or get worse. Document Released: 11/13/2007 Document Revised: 08/19/2011 Document Reviewed: 09/01/2013 [facility] Patient Information 2015 [place], [place]. This information is not intended to replace advice given to you by your health care provider. Make sure you discuss any questions you have with your health care provider.

## 2015-06-27 NOTE — Progress Notes (Signed)
HPI:  Acute visit for:  URI: -started: 1 week ago -symptoms:nasal congestion, sore throat, cough - now much better with persistent non-productive cough -denies:fever, SOB, NVD, tooth pain -has tried: nasal spray -sick contacts/travel/risks: denies flu exposure ROS: See pertinent positives and negatives per HPI.  Past Medical History  Diagnosis Date  . Diabetes mellitus without complication (HCC)     gestational, no issues since pregnancy per her report  . Abnormal Pap smear     normal 06/2013  . Depression     suicide attempt in college  . Hepatitis     B carrier, managed by Dr. Kinnie Scales  . History of chicken pox   . History of UTI     Past Surgical History  Procedure Laterality Date  . Colposcopy    . Cesarean section N/A 01/26/2013    Procedure: Primary Cesarean Section Delivery Baby  Boy @ 0017, Apgars 4/8/9;  Surgeon: Freddrick March. Tenny Craw, MD;  Location: [facility];  Service: Obstetrics;  Laterality: N/A;  . Nasal sinus surgery      Family History  Problem Relation Age of Onset  . Adopted: Yes    Social History   Social History  . Marital Status: Married    Spouse Name: N/A  . Number of Children: N/A  . Years of Education: N/A   Social History Main Topics  . Smoking status: Former Smoker -- 1.00 packs/day    Types: Cigarettes  . Smokeless tobacco: Never Used     Comment: smoked for 8 years, quit in 2013  . Alcohol Use: No  . Drug Use: No  . Sexual Activity: Yes    Birth Control/ Protection: Pill   Other Topics Concern  . 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  mometasone (NASONEX) 50 MCG/ACT nasal spray, Place 2 sprays into the nose daily., Disp: 17 g, Rfl: 2 .  mupirocin ointment (BACTROBAN) 2 %, Place 1  application into the nose 2 (two) times daily., Disp: 22 g, Rfl: 0 .  PRESCRIPTION MEDICATION, OCP (name unknown), Disp: , Rfl:  .  PRESCRIPTION MEDICATION, Anti-inflammatory (name unknown), Disp: , Rfl:  .  tenofovir (VIREAD) 300 MG tablet, Take 300 mg by mouth daily. Given by Dr Kinnie Scales, Disp: , Rfl:  .  valACYclovir (VALTREX) 1000 MG tablet, Take 2 tablets (2,000 mg total) by mouth 2 (two) times daily., Disp: 4 tablet, Rfl: 3 .  benzonatate (TESSALON) 100 MG capsule, Take 1 capsule (100 mg total) by mouth 2 (two) times daily as needed for cough., Disp: 20 capsule, Rfl: 0  EXAM:  Filed Vitals:   06/27/15 1530  BP: 98/68  Pulse: 95  Temp: 98.5 F (36.9 C)    Body mass index is 23.5 kg/(m^2).  GENERAL: vitals reviewed and listed above, alert, oriented, appears well hydrated and in no acute distress  HEENT: atraumatic, conjunttiva clear, no obvious abnormalities on inspection of external nose and ears, normal appearance of ear canals and TMs, clear nasal congestion, mild post oropharyngeal erythema with PND, no tonsillar edema or exudate, no sinus TTP  NECK: no obvious masses on inspection  LUNGS: clear to auscultation bilaterally, no wheezes, rales or rhonchi,  good air movement  CV: HRRR, no peripheral edema  MS: moves all extremities without noticeable abnormality  PSYCH: pleasant and cooperative, no obvious depression or anxiety  ASSESSMENT AND PLAN:  Discussed the following assessment and plan:  Cough  -given HPI and exam findings today, a serious infection or illness is unlikely. We discussed potential etiologies, with VURI being most likely, and advised supportive care and monitoring. Cough medication provided. We discussed treatment side effects, likely course, antibiotic misuse, transmission, and signs of developing a serious illness. -of course, we advised to return or notify a doctor immediately if symptoms worsen or persist or new concerns arise.    Patient  Instructions  BEFORE YOU LEAVE: -flu shot  INSTRUCTIONS FOR UPPER RESPIRATORY INFECTION:  -plenty of rest and fluids  -nasal saline wash 2-3 times daily (use prepackaged nasal saline or bottled/distilled water if making your own)   -in the winter time, using a humidifier at night is helpful (please follow cleaning instructions)  -if you are taking a cough medication - use only as directed, may also try a teaspoon of honey to coat the throat and throat lozenges. If given a cough medication with codeine or hydrocodone or other narcotic please be advised that this contains a strong and  potentially addicting medication. Please follow instructions carefully, take as little as possible and only use AS NEEDED for severe cough. Discuss potential side effects with your pharmacy. Please do not drive or operate machinery while taking these types of medications. Please do not take other sedating medications, drugs or alcohol while taking this medication without discussing with your doctor.  -for sore throat, salt water gargles can help  -follow up if you have fevers, facial pain, tooth pain, difficulty breathing or are worsening or symptoms persist longer then expected  Upper Respiratory Infection, Adult An upper respiratory infection (URI) is also known as the common cold. It is often caused by a type of germ (virus). Colds are easily spread (contagious). You can pass it to others by kissing, coughing, sneezing, or drinking out of the same glass. Usually, you get better in 1 to 3  weeks.  However, the cough can last for even longer. HOME CARE   Only take medicine as told by your doctor. Follow instructions provided above.  Drink enough water and fluids to keep your pee (urine) clear or pale yellow.  Get plenty of rest.  Return to work when your temperature is < 100 for 24 hours or as told by your doctor. You may use a face mask and wash your hands to stop your cold from spreading. GET HELP RIGHT  AWAY IF:   After the first few days, you feel you are getting worse.  You have questions about your medicine.  You have chills, shortness of breath, or red spit (mucus).  You have pain in the face for more then 1-2 days, especially when you bend forward.  You have a fever, puffy (swollen) neck, pain when you swallow, or white spots in the back of your throat.  You have a bad headache, ear pain, sinus pain, or chest pain.  You have a high-pitched whistling sound when you breathe in and out (wheezing).  You cough up blood.  You have sore muscles or a stiff neck. MAKE SURE YOU:   Understand these instructions.  Will watch your condition.  Will get help right away if you are not doing well or get worse. Document Released: 11/13/2007 Document Revised: 08/19/2011 Document Reviewed: 09/01/2013 [employer]  Patient Information 2015 [place], [place]. This information is not intended to replace advice given to you by your health care provider. Make sure you discuss any questions you have with your health care provider.      [name], Lamyiah Crawshaw R.

## 2015-08-17 ENCOUNTER — Ambulatory Visit (INDEPENDENT_AMBULATORY_CARE_PROVIDER_SITE_OTHER): Payer: BLUE CROSS/BLUE SHIELD | Admitting: Family Medicine

## 2015-08-17 ENCOUNTER — Encounter: Payer: Self-pay | Admitting: Family Medicine

## 2015-08-17 VITALS — BP 100/58 | HR 85 | Temp 98.5°F | Ht 59.75 in | Wt 121.2 lb

## 2015-08-17 DIAGNOSIS — R51 Headache: Secondary | ICD-10-CM | POA: Diagnosis not present

## 2015-08-17 DIAGNOSIS — R519 Headache, unspecified: Secondary | ICD-10-CM

## 2015-08-17 DIAGNOSIS — H6593 Unspecified nonsuppurative otitis media, bilateral: Secondary | ICD-10-CM | POA: Diagnosis not present

## 2015-08-17 DIAGNOSIS — H6982 Other specified disorders of Eustachian tube, left ear: Secondary | ICD-10-CM

## 2015-08-17 DIAGNOSIS — R42 Dizziness and giddiness: Secondary | ICD-10-CM | POA: Diagnosis not present

## 2015-08-17 DIAGNOSIS — J309 Allergic rhinitis, unspecified: Secondary | ICD-10-CM | POA: Diagnosis not present

## 2015-08-17 DIAGNOSIS — H6983 Other specified disorders of Eustachian tube, bilateral: Secondary | ICD-10-CM

## 2015-08-17 LAB — CBC WITH DIFFERENTIAL/PLATELET
BASOS ABS: 0 10*3/uL (ref 0.0–0.1)
Basophils Relative: 0.6 % (ref 0.0–3.0)
EOS ABS: 0.1 10*3/uL (ref 0.0–0.7)
Eosinophils Relative: 1.5 % (ref 0.0–5.0)
HCT: 36.9 % (ref 36.0–46.0)
Hemoglobin: 12.7 g/dL (ref 12.0–15.0)
LYMPHS ABS: 2.2 10*3/uL (ref 0.7–4.0)
Lymphocytes Relative: 42 % (ref 12.0–46.0)
MCHC: 34.4 g/dL (ref 30.0–36.0)
MCV: 87.2 fl (ref 78.0–100.0)
MONOS PCT: 8.5 % (ref 3.0–12.0)
Monocytes Absolute: 0.4 10*3/uL (ref 0.1–1.0)
NEUTROS ABS: 2.4 10*3/uL (ref 1.4–7.7)
NEUTROS PCT: 47.4 % (ref 43.0–77.0)
PLATELETS: 278 10*3/uL (ref 150.0–400.0)
RBC: 4.23 Mil/uL (ref 3.87–5.11)
RDW: 12.7 % (ref 11.5–15.5)
WBC: 5.2 10*3/uL (ref 4.0–10.5)

## 2015-08-17 LAB — BASIC METABOLIC PANEL
BUN: 14 mg/dL (ref 6–23)
CHLORIDE: 104 meq/L (ref 96–112)
CO2: 28 mEq/L (ref 19–32)
Calcium: 9.1 mg/dL (ref 8.4–10.5)
Creatinine, Ser: 0.58 mg/dL (ref 0.40–1.20)
GFR: 128.36 mL/min (ref >60.00)
Glucose, Bld: 88 mg/dL (ref 70–99)
POTASSIUM: 4.4 meq/L (ref 3.5–5.1)
SODIUM: 139 meq/L (ref 135–145)

## 2015-08-17 LAB — HEMOGLOBIN A1C: HEMOGLOBIN A1C: 5.4 % (ref 4.6–6.5)

## 2015-08-17 LAB — TSH: TSH: 0.58 u[IU]/mL (ref 0.35–4.50)

## 2015-08-17 MED ORDER — MOMETASONE FUROATE 50 MCG/ACT NA SUSP: 2.0000 | Freq: Every day | NASAL | Status: DC

## 2015-08-17 NOTE — Progress Notes (Signed)
HPI:  Kim BowerMaria K Pacheco  Is here today for 2 new complaints.  1) dizziness:  - started 2 years ago after childbirth   - feels sensation that room is lurching,  usually triggered by movement of head , last for a few seconds , occurs once several times per month  - occurred yesterday and she lost her balance and fell over   - denies:  Pain in head, chest pain,  Palpitations, symptoms with standing from sitting ,shortness of breath, weakness in extremities, numbness or tingling, cognitive changes , nausea, vomiting or syncope  - she looked this up on the Internet and it said that she is "going to die, and has a "brain tumor"  2) head pressure :  - pressure around her eyes  - recurs 2-3 times a week, resolved by aspirin  - she has seasonal allergies with chronic nasal congestion , uses Nasonex occasionally for this  Denies: headache , vision changes , hearing loss, sinus pain, fevers, tooth pain , shortness of breath or sore throat  - wears glasses, sees by specialist once yearly , due for a visit  ROS: See pertinent positives and negatives per HPI.  Past Medical History  Diagnosis Date  . Diabetes mellitus without complication (HCC)     gestational, no issues since pregnancy per her report  . Abnormal Pap smear     normal 06/2013  . Depression     suicide attempt in college  . Hepatitis     B carrier, managed by Dr. Kinnie ScalesMedoff  . History of chicken pox   . History of UTI     Past Surgical History  Procedure Laterality Date  . Colposcopy    . Cesarean section N/A 01/26/2013    Procedure: Primary Cesarean Section Delivery Baby  Boy @ 0017, Apgars 4/8/9;  Surgeon: Freddrick MarchKendra H. Tenny Crawoss, MD;  Location: [facility];  Service: Obstetrics;  Laterality: N/A;  . Nasal sinus surgery      Family History  Problem Relation Age of Onset  . Adopted: Yes    Social History   Social History  . Marital Status: Married    Spouse Name: N/A  . Number of Children: N/A  . Years of Education: N/A    Social History Main Topics  . Smoking status: Former Smoker -- 1.00 packs/day    Types: Cigarettes  . Smokeless tobacco: Never Used     Comment: smoked for 8 years, quit in 2013  . Alcohol Use: No  . Drug Use: No  . Sexual Activity: Yes    Birth Control/ Protection: Pill   Other Topics Concern  . 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  mometasone (NASONEX) 50 MCG/ACT nasal spray, Place 2 sprays into the nose daily., Disp: 17 g, Rfl: 2 .  PRESCRIPTION MEDICATION, OCP (name unknown), Disp: , Rfl:  .  tenofovir (VIREAD) 300 MG tablet, Take 300 mg by mouth daily. Given by Dr Kinnie ScalesMedoff, Disp: , Rfl:   EXAM:  Filed Vitals:   08/17/15 1304  BP: 100/58  Pulse: 85  Temp: 98.5 F (36.9 C)    Body mass index is 23.86 kg/(m^2).  GENERAL: vitals reviewed  and listed above, alert, oriented, appears well hydrated and in no acute distress  HEENT: atraumatic, conjunttiva clear, PERRLA, visual acuity grossly intact, no obvious abnormalities on inspection of external nose and ears, normal appearance of ear canals and TMs bilateral clear effusions - L >R, clear nasal congestion, boggy enlarged turbinates, mild post oropharyngeal erythema with PND, no tonsillar edema or exudate, no sinus TTP  NECK: no obvious masses on inspection  LUNGS: clear to auscultation bilaterally, no wheezes, rales or rhonchi, good air movement  CV: HRRR, no peripheral edema  MS: moves all extremities without noticeable abnormality  PSYCH/NEUR:: pleasant and cooperative, no obvious depression or anxiety, CN II-XII grossly intact, finger to nose normal, dix hallpike mildly positive to the L  ASSESSMENT AND PLAN:  Discussed the following assessment and plan:  Dizziness -  Plan: MR Brain W Wo Contrast, Hemoglobin A1c, TSH, CBC with Differential, Basic metabolic panel  Pressure in head - Plan: MR Brain W Wo Contrast  Allergic rhinitis, unspecified allergic rhinitis type  Middle ear effusion, bilateral  Eustachian tube dysfunction, bilateral  Eustachian tube dysfunction, left - Plan: mometasone (NASONEX) 50 MCG/ACT nasal spray  -we discussed possible serious and likely etiologies, workup and treatment, treatment risks and return precautions - supsect most symptoms secondary to her allergies and ear effusions, possible medication side effect, possible mild BPPV - labs per orders, she does have regular labs with her hepatologist   -MRI, though feel intracranial pathology unlikely will get to exclude given duration of symptoms -INS and short course nasal decongestant -follow up in 1 month -Patient advised to return or notify a doctor immediately if symptoms worsen or persist or new concerns arise.  Patient Instructions  Before you leave: Schedule a follow-up visit in 1 month Labs   Start Afrin nasal spray and used twice daily for 5 days. This is available over-the-counter. Ex  Use her Nasonex every day for the next month until you see me. Ex  We placed a referral for you as discussed for the MRI. It usually takes about 1-2 weeks to process and schedule this referral. If you have not heard from [place] regarding this appointment in 2 weeks please contact our office.  -We have ordered labs or studies at this visit. It can take up to 1-2 weeks for results and processing. We will contact you with instructions IF your results are abnormal. Normal results will be released to your [facility]. If you have not heard from [place] or can not find your results in [facility] in 2 weeks please contact our office.             [name].

## 2015-08-17 NOTE — Patient Instructions (Signed)
Before you leave: Schedule a follow-up visit in 1 month Labs   Start Afrin nasal spray and used twice daily for 5 days. This is available over-the-counter. Ex  Use her Nasonex every day for the next month until you see me. Ex  We placed a referral for you as discussed for the MRI. It usually takes about 1-2 weeks to process and schedule this referral. If you have not heard from us regarding this appointment in 2 weeks please contact our office.  -We have ordered labs or studies at this visit. It can take up to 1-2 weeks for results and processing. We will contact you with instructions IF your results are abnormal. Normal results will be released to your [facility]. If you have not heard from us or can not find your results in [facility] in 2 weeks please contact our office.

## 2015-08-17 NOTE — Progress Notes (Signed)
Pre visit review using our clinic review tool, if applicable. No additional management support is needed unless otherwise documented below in the visit note. 

## 2015-08-28 ENCOUNTER — Other Ambulatory Visit: Payer: BLUE CROSS/BLUE SHIELD

## 2015-09-21 ENCOUNTER — Ambulatory Visit: Payer: BLUE CROSS/BLUE SHIELD | Admitting: Family Medicine

## 2015-09-27 ENCOUNTER — Ambulatory Visit (INDEPENDENT_AMBULATORY_CARE_PROVIDER_SITE_OTHER): Payer: BLUE CROSS/BLUE SHIELD | Admitting: Family Medicine

## 2015-09-27 ENCOUNTER — Encounter: Payer: Self-pay | Admitting: Family Medicine

## 2015-09-27 VITALS — BP 108/68 | HR 84 | Temp 98.3°F | Ht 59.75 in | Wt 110.0 lb

## 2015-09-27 DIAGNOSIS — R42 Dizziness and giddiness: Secondary | ICD-10-CM

## 2015-09-27 DIAGNOSIS — H6982 Other specified disorders of Eustachian tube, left ear: Secondary | ICD-10-CM

## 2015-09-27 DIAGNOSIS — J329 Chronic sinusitis, unspecified: Secondary | ICD-10-CM

## 2015-09-27 DIAGNOSIS — H6592 Unspecified nonsuppurative otitis media, left ear: Secondary | ICD-10-CM | POA: Diagnosis not present

## 2015-09-27 DIAGNOSIS — J310 Chronic rhinitis: Secondary | ICD-10-CM

## 2015-09-27 MED ORDER — AMOXICILLIN-POT CLAVULANATE 875-125 MG PO TABS: 1.0000 | ORAL_TABLET | Freq: Two times a day (BID) | ORAL | Status: DC

## 2015-09-27 NOTE — Progress Notes (Signed)
HPI:  Kim Pacheco is a pleasant 30 yo with a PMH significant for gestational diabetes, depression, Hepatits B (sees Dr. Kinnie Pacheco) here for a follow up visit regarding vertigo, rhinitis and eustachian tube dysfunction. Cbc, bmp, TSH and hgba1c were normal. We advised a short course of nasal decongestant and intranasal steroid at her last visit. I ordered an MRI but it appears she did not do this. She reports she did not get the MRI as it was going to cost her $1000 out of pocket. She wonders if there are other treatment she can try for vertigo prior to getting this test done. She continues to have brief episodes of vertigo that seem to be related to movement several times per day. No syncope, presyncope, ears, weight loss, hearing loss or vision changes. She also had a cold last week with lots of nasal congestion, draining, sore throat and ear pressure. The left ear is now sore. She did the Afrin and she continues to take the Nasonex. No fevers, shortness of breath, sinus pain or drainage from the ear.    ROS: See pertinent positives and negatives per HPI.  Past Medical History  Diagnosis Date  . Diabetes mellitus without complication (HCC)     gestational, no issues since pregnancy per her report  . Abnormal Pap smear     normal 06/2013  . Depression     suicide attempt in college  . Hepatitis     B carrier, managed by Dr. Kinnie Pacheco  . History of chicken pox   . History of UTI     Past Surgical History  Procedure Laterality Date  . Colposcopy    . Cesarean section N/A 01/26/2013    Procedure: Primary Cesarean Section Delivery Baby  Boy @ 0017, Apgars 4/8/9;  Surgeon: Freddrick MarchKendra H. Tenny Crawoss, MD;  Location: [facility];  Service: Obstetrics;  Laterality: N/A;  . Nasal sinus surgery      Family History  Problem Relation Age of Onset  . Adopted: Yes    Social History   Social History  . Marital Status: Married    Spouse Name: N/A  . Number of Children: N/A  . Years of Education: N/A    Social History Main Topics  . Smoking status: Former Smoker -- 1.00 packs/day    Types: Cigarettes  . Smokeless tobacco: Never Used     Comment: smoked for 8 years, quit in 2013  . Alcohol Use: No  . Drug Use: No  . Sexual Activity: Yes    Birth Control/ Protection: Pill   Other Topics Concern  . 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  mometasone (NASONEX) 50 MCG/ACT nasal spray, Place 2 sprays into the nose daily., Disp: 17 g, Rfl: 2 .  PRESCRIPTION MEDICATION, OCP (name unknown), Disp: , Rfl:  .  tenofovir (VIREAD) 300 MG tablet, Take 300 mg by mouth daily. Given by Dr Kim Pacheco, Disp: , Rfl:  .  amoxicillin-clavulanate (AUGMENTIN) 875-125 MG tablet, Take 1 tablet by mouth 2 (two) times daily., Disp: 20 tablet, Rfl: 0  EXAM:  Filed Vitals:   09/27/15 0937  BP: 108/68  Pulse: 84    Body mass  index is 21.65 kg/(m^2).  GENERAL: vitals reviewed and listed above, alert, oriented, appears well hydrated and in no acute distress  HEENT: atraumatic, conjunttiva clear, no obvious abnormalities on inspection of external nose and ears, normal appearance of ear canals and TMs except for a discolored effusion in the left ear with mild bulging of the TM, thick nasal congestion, mild post oropharyngeal erythema with PND, no tonsillar edema or exudate, no sinus TTP  NECK: no obvious masses on inspection  LUNGS: clear to auscultation bilaterally, no wheezes, rales or rhonchi, good air movement  CV: HRRR, no peripheral edema  MS: moves all extremities without noticeable abnormality  PSYCH: pleasant and cooperative, cranial nerves II through XII grossly intact, no obvious depression or anxiety  ASSESSMENT AND PLAN:  Discussed the following  assessment and plan:  Vertigo - Plan: Ambulatory referral to Physical Therapy  Eustachian tube dysfunction, left  Rhinosinusitis - Plan: amoxicillin-clavulanate (AUGMENTIN) 875-125 MG tablet  Middle ear effusion, left - Plan: amoxicillin-clavulanate (AUGMENTIN) 875-125 MG tablet  -Given discolored effusion, persistent sinus and ear symptoms and thick nasal congestion will do an antibiotic after discussion risks/benefits. Continue INS. -She opted to try a course of vestibular rehabilitation for the vertigo prior to obtaining the MRI. Advised if she does have persistent symptoms in 2 weeks that she should get the MRI. She agrees to call us with an update. The sinus and ear issues could be contributing and hopefully the antibiotic will help. If not, we may need to have her see an ear nose and throat specialist. -Patient advised to return or notify a doctor immediately if symptoms worsen or persist or new concerns arise.  Patient Instructions  -We placed a referral for you as discussed for the physical therapy for the dizziness. It usually takes about 1-2 weeks to process and schedule this referral. If you have not heard from [place] regarding this appointment in 2 weeks please contact our office.  -Please start the antibiotic, Augmentin, and take according to instructions and follow all instructions carefully.  -Please continue the Nasonex  -Please call in 2 weeks and let us know if your symptoms persist. Would definitely advise that you get the MRI if you have persistent vertigo or dizziness.  Follow-up in 1 month or sooner as needed.        [name].

## 2015-09-27 NOTE — Progress Notes (Signed)
Pre visit review using our clinic review tool, if applicable. No additional management support is needed unless otherwise documented below in the visit note. 

## 2015-09-27 NOTE — Patient Instructions (Signed)
-  We placed a referral for you as discussed for the physical therapy for the dizziness. It usually takes about 1-2 weeks to process and schedule this referral. If you have not heard from us regarding this appointment in 2 weeks please contact our office.  -Please start the antibiotic, Augmentin, and take according to instructions and follow all instructions carefully.  -Please continue the Nasonex  -Please call in 2 weeks and let us know if your symptoms persist. Would definitely advise that you get the MRI if you have persistent vertigo or dizziness.  Follow-up in 1 month or sooner as needed.

## 2015-10-04 DIAGNOSIS — H5702 Anisocoria: Secondary | ICD-10-CM | POA: Diagnosis not present

## 2015-10-06 ENCOUNTER — Ambulatory Visit: Payer: BLUE CROSS/BLUE SHIELD | Attending: Family Medicine | Admitting: Physical Therapy

## 2015-10-06 ENCOUNTER — Encounter: Payer: Self-pay | Admitting: Physical Therapy

## 2015-10-06 DIAGNOSIS — R2681 Unsteadiness on feet: Secondary | ICD-10-CM | POA: Diagnosis not present

## 2015-10-06 DIAGNOSIS — R42 Dizziness and giddiness: Secondary | ICD-10-CM | POA: Diagnosis not present

## 2015-10-06 NOTE — Therapy (Signed)
[facility] [address] [place], [place], [address] Phone: [phone]   Fax:  [phone]  Physical Therapy Evaluation  Patient Details  Name: Kim Pacheco MRN: [id number] Date of Birth: 01/04/1986 Referring Provider: Kriste Basque, DO  Encounter Date: 10/06/2015      PT End of Session - 10/06/15 0958    Visit Number 1   Number of Visits 5  eval + 4 visits   Date for PT Re-Evaluation 11/05/15   Authorization Type [employer]   PT Start Time 0807  Pt arrived late to session   PT Stop Time 0849   PT Time Calculation (min) 42 min   Activity Tolerance Patient tolerated treatment well   Behavior During Therapy [facility] for tasks assessed/performed      Past Medical History  Diagnosis Date  . Diabetes mellitus without complication (HCC)     gestational, no issues since pregnancy per her report  . Abnormal Pap smear     normal 06/2013  . Depression     suicide attempt in college  . Hepatitis     B carrier, managed by Dr. Kinnie Scales  . History of chicken pox   . History of UTI     Past Surgical History  Procedure Laterality Date  . Colposcopy    . Cesarean section N/A 01/26/2013    Procedure: Primary Cesarean Section Delivery Baby  Boy @ 0017, Apgars 4/8/9;  Surgeon: Freddrick March. Tenny Craw, MD;  Location: [facility];  Service: Obstetrics;  Laterality: N/A;  . Nasal sinus surgery      There were no vitals filed for this visit.       Subjective Assessment - 10/06/15 0811    Subjective Onset of vertigo after giving birth to son 2 years ago. "It wasn't that bad until I was doing dishes recently and I fell over. Dr. Selena Batten gave me some nasal spray. She later noticed that I was having an ear infection, so she gave me some amoxicillin for that."   Pertinent History PMH significant for: Hepatitis B, depression, gestational DM, L eustachian tube dysfunction   Patient Stated Goals "To not get dizzy."   Currently in Pain? No/denies             [facility] PT Assessment - 10/06/15 0001    Assessment   Medical Diagnosis Vertigo   Referring Provider Kriste Basque, DO   Onset Date/Surgical Date 08/17/15   Hand Dominance Right   Precautions   Precautions Fall   Restrictions   Weight Bearing Restrictions No   Balance Screen   Has the patient fallen in the past 6 months Yes   How many times? 1   Has the patient had a decrease in activity level because of a fear of falling?  No   Is the patient reluctant to leave their home because of a fear of falling?  No   Home Environment   Living Environment Private residence   Living Arrangements Spouse/significant other   Home Access Level entry   Home Layout Two level   Alternate Level Stairs-Number of Steps 20   Alternate Level Stairs-Rails Left   Home Equipment None   Additional Comments Pt reports no difficulty with stairs   Prior Function   Level of Independence Independent   Vocation Part time employment   Patent examiner   Leisure photography and spending time with 2 y/o son   Cognition   Overall Cognitive Status Within Functional Limits for tasks assessed  Vestibular Assessment - 10/06/15 0001    Symptom Behavior   Type of Dizziness Lightheadedness  floating   Frequency of Dizziness Once every other day   Duration of Dizziness a couple minutes   Aggravating Factors No known aggravating factors   Relieving Factors No known relieving factors   Occulomotor Exam   Occulomotor Alignment Normal   Spontaneous Absent   Gaze-induced Absent   Smooth Pursuits Intact   Saccades Intact   Comment (+) and symptomatic R Head Thrust Test.   Vestibulo-Occular Reflex   VOR 1 Head Only (x 1 viewing) Pt reports 4/10 dizziness with x1 viewing with horizontal head turns.   VOR Cancellation Normal   Positional Testing   Dix-Hallpike Dix-Hallpike Right;Dix-Hallpike Left   Sidelying Test Sidelying Right   Horizontal Canal Testing Horizontal Canal  Right;Horizontal Canal Left   Dix-Hallpike Right   Dix-Hallpike Right Duration < 10 seconds of concordant "floating"; unable to visualize nystagmus using Frenzel lenses   Dix-Hallpike Right Symptoms No nystagmus   Dix-Hallpike Left   Dix-Hallpike Left Duration NA   Dix-Hallpike Left Symptoms No nystagmus   Sidelying Right   Sidelying Right Duration Approx. 10 seconds   Sidelying Right Symptoms No nystagmus  in room light   Horizontal Canal Right   Horizontal Canal Right Duration NA   Horizontal Canal Right Symptoms Normal   Horizontal Canal Left   Horizontal Canal Left Duration NA   Horizontal Canal Left Symptoms Normal   Positional Sensitivities   Supine to Right Side Lightheadedness   Right Hallpike Lightheadedness                Vestibular Treatment/Exercise - 10/06/15 0001    Vestibular Treatment/Exercise   Vestibular Treatment Provided Habituation;Gaze   Habituation Exercises Austin MilesBrandt Daroff   Gaze Exercises X1 Viewing Horizontal;X1 Viewing Vertical   Austin MilesBrandt Daroff   Number of Reps  2   Symptom Description  lightheadedness   X1 Viewing Horizontal   Foot Position standing; shoulder width   Reps 2   Comments 2 x30-second trials with cueing for technique   X1 Viewing Vertical   Foot Position standing; shoulder width   Reps 2   Comments 2 x30-second trials with cueing for technique            Balance Exercises - 10/06/15 0957    Balance Exercises: Standing   Standing Eyes Closed Wide (BOA);Foam/compliant surface;5 reps;Other (comment)  1 pillow; horizontal, vertical head turns 2 x5 each           PT Education - 10/06/15 0953    Education provided Yes   Education Details PT eval findings, goals, and POC. Initiate HEP; see Pt Instructions for details.    Person(s) Educated Patient   Methods Explanation;Demonstration;Verbal cues;Handout   Comprehension Verbalized understanding;Returned demonstration          PT Short Term Goals - 10/06/15 1005     PT SHORT TERM GOAL #1   Title STG's = LTG's           PT Long Term Goals - 10/06/15 1005    PT LONG TERM GOAL #1   Title Pt will perform vestibular HEP independently to maximize functional gains made in PT.  (Target date: 11/03/15)   PT LONG TERM GOAL #2   Title Pt will ambulate >200' over unlevel paved and grass surfaces with intermittent functional head turns with no overt LOB and no more than 2-point increase in subjective dizziness.     PT LONG TERM GOAL #  3   Title Pt will independently ambulate 100' with concurrent horizontal head turns with no overt LOB to enable pt to safely scan grocery store aisle.    PT LONG TERM GOAL #4   Title Pt will improve DHI from 18 to 0 to indicate decreased pt-perceived disability due to dizziness.                Plan - 10/06/15 1000    Clinical Impression Statement Pt is a 30 y/o F referred to vestibular PT to address vertigo. PMH significant for: Hepatitis B, depression, gestational DM, L eustachian tube dysfunction. PT evaluation reveals the following: (+) and symptomatic R Head Thrust Test; motion sensitivity (concordant "floating" but no true vertigo or nystagmus) with R Dix-Hallpike and R Sidelying Test; impaired VOR; and decreased postural stability with vestibular reliance. Based on said findings, unable to rule out R-sided vestibular hypofunciton. Pt will benefit from skilled outpatient PT 1x/week for up to 4 weeks to address saiid impairments.    Rehab Potential Good   PT Frequency 1x / week   PT Duration 4 weeks   PT Treatment/Interventions Vestibular;Canalith Repostioning;ADLs/Self Care Home Management;Neuromuscular re-education;Functional mobility training;Therapeutic activities;Patient/family education;Therapeutic exercise;Balance training;Stair training;Gait training   PT Next Visit Plan Progress vestibular HEP prn. Stand balance with emphasis on vestibular reliance. Gait with head turns.   Consulted and Agree with Plan of Care  Patient      Patient will benefit from skilled therapeutic intervention in order to improve the following deficits and impairments:  Dizziness, Decreased balance  Visit Diagnosis: Dizziness and giddiness - Plan: PT plan of care cert/re-cert  Unsteadiness on feet - Plan: PT plan of care cert/re-cert     Problem List Patient Active Problem List   Diagnosis Date Noted  . Hepatitis B 06/14/2014    Jorje Guild, PT, DPT [facility] [address] [place], [place], [address] Phone: [phone]   Fax:  [phone] 10/06/2015, 10:10 AM  Name: Kim Pacheco MRN: [id number] Date of Birth: 1986-01-31

## 2015-10-06 NOTE — Patient Instructions (Signed)
Tip Card 1.The goal of habituation training is to assist in decreasing symptoms of vertigo, dizziness, or nausea provoked by specific head and body motions. 2.These exercises may initially increase symptoms; however, be persistent and work through symptoms. With repetition and time, the exercises will assist in reducing or eliminating symptoms. 3.Exercises should be stopped and discussed with the therapist if you experience any of the following: - Sudden change or fluctuation in hearing - New onset of ringing in the ears, or increase in current intensity - Any fluid discharge from the ear - Severe pain in neck or back - Extreme nausea. If symptoms increase to > 4/10, stop and rest to let symptoms settle. Sit to Side-Lying   Sit on edge of bed. Lie down onto the right side and hold until dizziness stops, plus 20 seconds.  Return to sitting and wait until dizziness stops, plus 20 seconds.  Repeat to the left side. Repeat sequence 5 times per session. Do 2 sessions per day.  Copyright  VHI. All rights reserved.  Gaze Stabilization: Tip Card 1.Target must remain in focus, not blurry, and appear stationary while head is in motion. 2.Perform exercises with small head movements (45 to either side of midline). 3.Increase speed of head motion so long as target is in focus. 4.If you wear eyeglasses, be sure you can see target through lens (therapist will give specific instructions for bifocal / progressive lenses). 5.These exercises may provoke dizziness or nausea. Work through these symptoms. If too dizzy, slow head movement slightly. Rest between each exercise. 6.Exercises demand concentration; avoid distractions. 7.For safety, perform standing exercises close to a counter, wall, corner, or next to someone.  Copyright  VHI. All rights reserved.  Gaze Stabilization: Standing Feet Apart   Feet shoulder width apart, keeping eyes on target on wall 3 feet away (at eye-level), tilt head down slightly  and move head side to side for 30 seconds. Repeat while moving head up and down for 30 seconds. Do 2 sessions per day.   Feet Apart (Compliant Surface) Head Motion - Eyes Closed    Stand with your back to a corner with a stable chair in front of you. Stand on 1 pillow with feet shoulder width apart. Close eyes and move head slowly: up and down 5 times; right to left 5 times.  Repeat _2___ times per day. * As this exercise gets easier, increase reps to 10, as tolerated. When that gets easier, try moving feet slightly closer together on pillow.

## 2015-10-13 ENCOUNTER — Encounter: Payer: Self-pay | Admitting: Family Medicine

## 2015-10-13 ENCOUNTER — Ambulatory Visit (INDEPENDENT_AMBULATORY_CARE_PROVIDER_SITE_OTHER): Payer: BLUE CROSS/BLUE SHIELD | Admitting: Family Medicine

## 2015-10-13 VITALS — BP 98/60 | HR 87 | Temp 98.5°F | Ht 59.75 in | Wt 121.6 lb

## 2015-10-13 DIAGNOSIS — J309 Allergic rhinitis, unspecified: Secondary | ICD-10-CM | POA: Diagnosis not present

## 2015-10-13 DIAGNOSIS — H6981 Other specified disorders of Eustachian tube, right ear: Secondary | ICD-10-CM

## 2015-10-13 DIAGNOSIS — H9201 Otalgia, right ear: Secondary | ICD-10-CM

## 2015-10-13 NOTE — Patient Instructions (Addendum)
Please add Claritin to your allergy regimen and take once daily along with Nasonex.  Afrin short course, twice daily for 4-5 days. Do not use longer.  Debrox for the ear wax per instructions.  If your ear problems persist in 2 weeks please schedule an appointment with the ear, nose and throat doctor.  Follow up as needed.

## 2015-10-13 NOTE — Progress Notes (Signed)
Pre visit review using our clinic review tool, if applicable. No additional management support is needed unless otherwise documented below in the visit note. 

## 2015-10-13 NOTE — Progress Notes (Signed)
HPI:  Kim Pacheco a pleasant 30 year old with recent upper respiratory infection and allergy symptoms with eustachian tube dysfunction and then appeared to be developing a sinusitis and possible otitis media and was recently treated with Augmentin and nasal steroids here for an acute visit for ear pain. She reports she was doing better, but for the last few days she has had some pain in the right ear and below the right ear and has heard some popping in this ear. No fevers, sinus pain, thick sinus congestion, dizziness, drainage from the ear or hearing loss. She wonders if she has a ruptured eardrum.  ROS: See pertinent positives and negatives per HPI.  Past Medical History  Diagnosis Date  . Diabetes mellitus without complication (HCC)     gestational, no issues since pregnancy per her report  . Abnormal Pap smear     normal 06/2013  . Depression     suicide attempt in college  . Hepatitis     B carrier, managed by Dr. Kinnie Scales  . History of chicken pox   . History of UTI     Past Surgical History  Procedure Laterality Date  . Colposcopy    . Cesarean section N/A 01/26/2013    Procedure: Primary Cesarean Section Delivery Baby  Boy @ 0017, Apgars 4/8/9;  Surgeon: Freddrick March. Tenny Craw, MD;  Location: [facility];  Service: Obstetrics;  Laterality: N/A;  . Nasal sinus surgery      Family History  Problem Relation Age of Onset  . Adopted: Yes    Social History   Social History  . Marital Status: Married    Spouse Name: N/A  . Number of Children: N/A  . Years of Education: N/A   Social History Main Topics  . Smoking status: Former Smoker -- 1.00 packs/day    Types: Cigarettes  . Smokeless tobacco: Never Used     Comment: smoked for 8 years, quit in 2013  . Alcohol Use: No  . Drug Use: No  . Sexual Activity: Yes    Birth Control/ Protection: Pill   Other Topics Concern  . None   Social History Narrative   Work or School: dog show - Glass blower/designer  Situation: lives with husband, son born in 01/2013      Spiritual Beliefs: none      Lifestyle: no regular exercise; diet is fair - american diet        Current outpatient prescriptions:  .  mometasone (NASONEX) 50 MCG/ACT nasal spray, Place 2 sprays into the nose daily., Disp: 17 g, Rfl: 2 .  PRESCRIPTION MEDICATION, OCP (name unknown), Disp: , Rfl:  .  tenofovir (VIREAD) 300 MG tablet, Take 300 mg by mouth daily. Given by Dr Kinnie Scales, Disp: , Rfl:   EXAM:  Filed Vitals:   10/13/15 1435  BP: 98/60  Pulse: 87  Temp: 98.5 F (36.9 C)    Body mass index is 23.94 kg/(m^2).  GENERAL: vitals reviewed and listed above, alert, oriented, appears well hydrated and in no acute distress  HEENT: atraumatic, conjunttiva clear, no obvious abnormalities on inspection of external nose and ears, normal appearance of ear canals and TMs except for very small amount soft wax in R ear canal, non-obstructive, clear nasal congestion, mild post oropharyngeal erythema with PND, no tonsillar edema or exudate, no sinus TTP, no TMJ tenderness to palpation or crepitus  NECK: no obvious masses on inspection  LUNGS: clear to auscultation bilaterally, no wheezes, rales  or rhonchi, good air movement  CV: HRRR, no peripheral edema  MS: moves all extremities without noticeable abnormality  PSYCH: pleasant and cooperative, no obvious depression or anxiety  ASSESSMENT AND PLAN:  Discussed the following assessment and plan:  Allergic rhinitis, unspecified allergic rhinitis type  ETD (eustachian tube dysfunction), right  Ear pain, right  -her ears look much much better today, with resolution of effusions and discolored fluid. Very small amount of soft wax in the right ear canal. -suspect allergic rhinitis with ETD - opted for short course afrin, continue INS, add antihistamine and ENT eval if symptoms persist despite these measures -Patient advised to return or notify a doctor immediately if symptoms  worsen or persist or new concerns arise.  Patient Instructions  Please add Claritin to your allergy regimen and take once daily along with Nasonex.  Afrin short course, twice daily for 4-5 days. Do not use longer.  Debrox for the ear wax per instructions.  If your ear problems persist in 2 weeks please schedule an appointment with the ear, nose and throat doctor.  Follow up as needed.     Kim Pacheco, Kim R.

## 2015-10-17 ENCOUNTER — Ambulatory Visit: Payer: BLUE CROSS/BLUE SHIELD | Attending: Family Medicine | Admitting: Physical Therapy

## 2015-10-17 VITALS — BP 112/82 | HR 65

## 2015-10-17 DIAGNOSIS — R2681 Unsteadiness on feet: Secondary | ICD-10-CM | POA: Diagnosis not present

## 2015-10-17 DIAGNOSIS — R42 Dizziness and giddiness: Secondary | ICD-10-CM

## 2015-10-17 NOTE — Therapy (Signed)
[facility] [address] [place], [place], [address] Phone: [phone]   Fax:  [phone]  Physical Therapy Treatment  Patient Details  Name: Kim Pacheco MRN: [id number] Date of Birth: 10/23/1985 Referring Provider: Kriste Basque, DO  Encounter Date: 10/17/2015      PT End of Session - 10/17/15 1237    Visit Number 2   Number of Visits 5   Date for PT Re-Evaluation 11/05/15   Authorization Type BCBS   PT Start Time 1147   PT Stop Time 1226   PT Time Calculation (min) 39 min   Activity Tolerance Patient tolerated treatment well   Behavior During Therapy [facility] for tasks assessed/performed      Past Medical History  Diagnosis Date  . Diabetes mellitus without complication (HCC)     gestational, no issues since pregnancy per her report  . Abnormal Pap smear     normal 06/2013  . Depression     suicide attempt in college  . Hepatitis     B carrier, managed by Dr. Kinnie Scales  . History of chicken pox   . History of UTI     Past Surgical History  Procedure Laterality Date  . Colposcopy    . Cesarean section N/A 01/26/2013    Procedure: Primary Cesarean Section Delivery Baby  Boy @ 0017, Apgars 4/8/9;  Surgeon: Freddrick March. Tenny Craw, MD;  Location: [facility];  Service: Obstetrics;  Laterality: N/A;  . Nasal sinus surgery      Filed Vitals:   10/17/15 1212  BP: 112/82  Pulse: 65  SpO2: 98%        Subjective Assessment - 10/17/15 1147    Subjective Pt reports that she saw PCP to address ongoing "popping and cracking" in R ear. MD examined ear and found nothing signiificant. Pt hasn't been experiencing popping/cracking since.    Pertinent History PMH significant for: Hepatitis B, depression, gestational DM, L eustachian tube dysfunction   Patient Stated Goals "To not get dizzy."   Currently in Pain? No/denies            [facility] PT Assessment - 10/17/15 0001    Functional Gait  Assessment   Gait assessed   Yes   Gait Level Surface Walks 20 ft in less than 5.5 sec, no assistive devices, good speed, no evidence for imbalance, normal gait pattern, deviates no more than 6 in outside of the 12 in walkway width.   Change in Gait Speed Able to smoothly change walking speed without loss of balance or gait deviation. Deviate no more than 6 in outside of the 12 in walkway width.   Gait with Horizontal Head Turns Performs head turns smoothly with slight change in gait velocity (eg, minor disruption to smooth gait path), deviates 6-10 in outside 12 in walkway width, or uses an assistive device.  reproduces dizziness   Gait with Vertical Head Turns Performs head turns with no change in gait. Deviates no more than 6 in outside 12 in walkway width.   Gait and Pivot Turn Pivot turns safely in greater than 3 sec and stops with no loss of balance, or pivot turns safely within 3 sec and stops with mild imbalance, requires small steps to catch balance.  reproduced dizziness   Step Over Obstacle Is able to step over 2 stacked shoe boxes taped together (9 in total height) without changing gait speed. No evidence of imbalance.   Gait with Narrow Base of Support Is able to  ambulate for 10 steps heel to toe with no staggering.   Gait with Eyes Closed Walks 20 ft, no assistive devices, good speed, no evidence of imbalance, normal gait pattern, deviates no more than 6 in outside 12 in walkway width. Ambulates 20 ft in less than 7 sec.   Ambulating Backwards Walks 20 ft, no assistive devices, good speed, no evidence for imbalance, normal gait   Steps Alternating feet, no rail.   Total Score 28            Vestibular Assessment - 10/17/15 0001    Vestibulo-Occular Reflex   Comment When attepmting x1 viewing today, pt reports diplopia only on L side of A. This is present only when L eye open and doesn't disappear/change when R eye closed. Focus in L visual field slightly improved at midline when looking superior/inferior.     Positional Sensitivities   Supine to Left Side No dizziness   Supine to Right Side No dizziness                  Vestibular Treatment/Exercise - 10/17/15 0001    Vestibular Treatment/Exercise   Vestibular Treatment Provided Habituation   Habituation Exercises Austin Miles   Gaze Exercises X1 Viewing Horizontal   Austin Miles   Number of Reps  2   Symptom Description  asymptomatic   X1 Viewing Horizontal   Foot Position standing   Reps 1   Comments Pt reporting diplopia in L visual field at this time, stating, "The left side of the A has 2 lines." Due to gaze not being stable at rest, removed from HEP at this time.            Balance Exercises - 10/17/15 1241    Balance Exercises: Standing   Standing Eyes Opened Narrow base of support (BOS);Head turns;Foam/compliant surface;5 reps  vert, horiz, diag head turns without instability   Standing Eyes Closed Wide (BOA);Foam/compliant surface;5 reps;Other (comment)  2 pillows; horiz, vert, diag head turns x10 each   Gait with Head Turns Forward;Other reps (comment)  4 x25' in hallway   Turning Both;10 reps           PT Education - 10/17/15 1240    Education provided Yes   Education Details HEP progressed, modified to remove Brandt-Daroff (as pt no longer experiencing symptoms) and gaze stabilization (as gaze not stable at rest) Recommended to notify PCP of diplopia in L visual field.   Person(s) Educated Patient   Methods Explanation;Demonstration;Handout   Comprehension Verbalized understanding;Returned demonstration          PT Short Term Goals - 10/06/15 1005    PT SHORT TERM GOAL #1   Title STG's = LTG's           PT Long Term Goals - 10/06/15 1005    PT LONG TERM GOAL #1   Title Pt will perform vestibular HEP independently to maximize functional gains made in PT.  (Target date: 11/03/15)   PT LONG TERM GOAL #2   Title Pt will ambulate >200' over unlevel paved and grass surfaces with intermittent  functional head turns with no overt LOB and no more than 2-point increase in subjective dizziness.     PT LONG TERM GOAL #3   Title Pt will independently ambulate 100' with concurrent horizontal head turns with no overt LOB to enable pt to safely scan grocery store aisle.    PT LONG TERM GOAL #4   Title Pt will improve DHI from 18  to 0 to indicate decreased pt-perceived disability due to dizziness.                Plan - 10/17/15 1243    Clinical Impression Statement Session focused on assessing current symptoms/function and progressing HEP, as appropriate. Pt no longer experiencing lightheadedness with R side lying <> sit. Therefore, removed [employer]t Daroff from HEP.  During gaze stabilization, pt reporting L aspect of visual target ("A") double. Diplopia in L visual field unchanged with closing R eye. Vital signs WNL and pt experiencing no other symptoms. Notified primary MD, who will contact patient today. FGA score 28/30; pt experienced concordant dizziness/disequilibrium . Added these 2 items to HEP.   Rehab Potential Good   PT Frequency 1x / week   PT Duration 4 weeks   PT Treatment/Interventions Vestibular;Canalith Repostioning;ADLs/Self Care Home Management;Neuromuscular re-education;Functional mobility training;Therapeutic activities;Patient/family education;Therapeutic exercise;Balance training;Stair training;Gait training   PT Next Visit Plan Assess HEP and progress prn.    Consulted and Agree with Plan of Care Patient      Patient will benefit from skilled therapeutic intervention in order to improve the following deficits and impairments:  Dizziness, Decreased balance  Visit Diagnosis: Dizziness and giddiness  Unsteadiness on feet     Problem List Patient Active Problem List   Diagnosis Date Noted  . Hepatitis B 06/14/2014   Jorje GuildBlair Hobble, PT, DPT [facility] [address] [place], [place], [address] Phone: [phone]    Fax:  [phone] 10/17/2015, 12:48 PM  Name: Juluis MireMaria K Pacheco MRN: [id number] Date of Birth: 01/14/1986

## 2015-10-17 NOTE — Patient Instructions (Addendum)
Feet Apart (Compliant Surface) Head Motion - Eyes Closed    Stand with your back to a corner with a stable chair in front of you. Stand on 2 pillows (or couch cushion) with feet shoulder width apart. Close eyes and move head slowly: up and down 10 times; right to left 10 times; diagonally up/right to down/left 10 times; and diagonally up/left to down/right 10 times. Repeat _2___ times per day. * As this exercise gets easier, increase reps to 10, as tolerated. When that gets easier, try moving feet slightly closer together on pillow.   Walking Head Turn    Walk forward down your narrow hallway while turning head side to side (to right for 2 steps, then to left for 2 steps). Touch wall, as needed, to keep balance. Walk length of hallway 5 times (down and back) twice per day.   Turning in Place: Compensatory Strategy    Start by standing in place, facing a wall with a target ("A") on wall. Use your feet to perform a full turn (360 degrees). Upon returning to start position, look at your target ("A"). As you progress, try 360-turn without visual target ("A"). Repeat in opposite direction. Repeat __5__ times per direction. Do __2_ sessions per day.

## 2015-10-19 ENCOUNTER — Telehealth: Payer: Self-pay | Admitting: *Deleted

## 2015-10-19 NOTE — Telephone Encounter (Signed)
-----   Message from Terressa KoyanagiHannah R Kim, DO sent at 10/17/2015 12:23 PM EDT ----- Regarding: FW: Patient symptom during PT today Ronnald CollumJo Anne, please call pt and let her know we received an update from her physical therapist about some vision issues. Wanted to check on her. Advise that she schedule an appointment with ophthalmology and seek urgent care if any further vision issues.  I think she should get the MRI we advised. She had cost concerns, please direct her to Marchelle Folksmanda to assist with this. Thanks. ----- Message -----    From: Calvert CantorBlair A Hobble, PT    Sent: 10/17/2015  12:07 PM      To: Terressa KoyanagiHannah R Kim, DO Subject: Patient symptom during PT today                Dr. Leeanne DeedKim,  I've been seeing Kim Pacheco for vestibular PT. She has progressing well so far. Today the patient reported diplopia in the L aspect of her visual field. The diplopia was unchanged when R eye was closed but disappeared when L eye was closed. Vision was blurred (but not double) with both eyes open when looking upward and downward. Vital signs WNL and no other symptoms at this time.  I just wanted to make you aware of this. Don't hesitate to contact me with any questions.  Thanks, Jorje GuildBlair Hobble, PT, DPT [facility] [address] [place], [place], [address] Phone: [phone]   Fax:  [phone] 10/17/2015, 12:13 PM

## 2015-10-19 NOTE — Telephone Encounter (Signed)
I called the pt and informed her of the message below.  She stated she will see her eye doctor if needed and has the phone number to call to schedule the MRI.

## 2015-10-24 ENCOUNTER — Encounter: Payer: BLUE CROSS/BLUE SHIELD | Admitting: Physical Therapy

## 2015-10-31 ENCOUNTER — Telehealth: Payer: Self-pay | Admitting: Family Medicine

## 2015-10-31 ENCOUNTER — Ambulatory Visit: Payer: BLUE CROSS/BLUE SHIELD | Admitting: Physical Therapy

## 2015-10-31 DIAGNOSIS — R2681 Unsteadiness on feet: Secondary | ICD-10-CM

## 2015-10-31 DIAGNOSIS — R42 Dizziness and giddiness: Secondary | ICD-10-CM | POA: Diagnosis not present

## 2015-10-31 NOTE — Telephone Encounter (Signed)
I called the pt and she stated she is having vertigo again and she is aware the order was entered for the MRI.

## 2015-10-31 NOTE — Therapy (Signed)
[facility] [address] [place], [place], [address] Phone: [phone]   Fax:  [phone]  Physical Therapy Treatment and Discharge Summary  Patient Details  Name: Kim Pacheco MRN: [id number] Date of Birth: 10-24-1985 Referring Provider: Colin Benton, DO  Encounter Date: 10/31/2015      PT End of Session - 10/31/15 0925    Visit Number 3   Number of Visits 5   Date for PT Re-Evaluation 11/05/15   Authorization Type BCBS   PT Start Time 0845   PT Stop Time 0910  Session ended early due to patient discharged   PT Time Calculation (min) 25 min   Activity Tolerance Patient tolerated treatment well   Behavior During Therapy [facility] for tasks assessed/performed      Past Medical History  Diagnosis Date  . Diabetes mellitus without complication (HCC)     gestational, no issues since pregnancy per her report  . Abnormal Pap smear     normal 06/2013  . Depression     suicide attempt in college  . Hepatitis     B carrier, managed by Dr. Earlean Shawl  . History of chicken pox   . History of UTI     Past Surgical History  Procedure Laterality Date  . Colposcopy    . Cesarean section N/A 01/26/2013    Procedure: Primary Cesarean Section Delivery Baby  Boy @ 0017, Apgars 4/8/9;  Surgeon: Farrel Gobble. Harrington Challenger, MD;  Location: [facility];  Service: Obstetrics;  Laterality: N/A;  . Nasal sinus surgery      There were no vitals filed for this visit.      Subjective Assessment - 10/31/15 0847    Subjective "Now I mostly get dizzy when I'm standing for too long. I'm not moving my head or anything." Pt reports having spoken with PCP since last session. Planning to schedule MRI but hasn't yet.    Pertinent History PMH significant for: Hepatitis B, depression, gestational DM, L eustachian tube dysfunction   Patient Stated Goals "To not get dizzy."   Currently in Pain? No/denies                Vestibular Assessment -  10/31/15 0001    Balancemaster   Therapist, occupational Comment Composite score = 12% compared to age/height normative value of 72%. Sensory analysis: Vestibular 1% as compared with age/height normative value of 55%; Visual 1% as compared with age/height normative value of 74%; Somatosensory 22% as compared with age/height normative value of 88%. Findings suggest significant impairment in ability to use visual, vestibular, and somatosensory input for balance.  13 total "falls" sustained (during all conditions with exception of Condition 1). Ankle strategy dominant. COG alignment: slightly to R of midline.                  [facility] Adult PT Treatment/Exercise - 10/31/15 0001    Ambulation/Gait   Ambulation/Gait Yes   Ambulation/Gait Assistance 7: Independent;5: Supervision   Ambulation/Gait Assistance Details Independent for all gait with exception of (S) required for gait with horizontal and diagonal head turns.    Ambulation Distance (Feet) 250 Feet   Assistive device None   Gait Pattern Within Functional Limits  with head turns, gait veers to direction of head turn                PT Education - 10/31/15 0917    Education provided Yes   Education Details SOT findings,  functional implications. Recommending DC from vestibular PT at this time, as SOT findings and pt symptoms no longer suggestive of vestibular etiology of dizziness.    Person(s) Educated Patient   Methods Explanation   Comprehension Verbalized understanding          PT Short Term Goals - 10/06/15 1005    PT SHORT TERM GOAL #1   Title STG's = LTG's           PT Long Term Goals - 10/31/15 8937    PT LONG TERM GOAL #1   Title Pt will perform vestibular HEP independently to maximize functional gains made in PT.  (Target date: 11/03/15)   Baseline Met 5/23.   Status Achieved   PT LONG TERM GOAL #2   Title Pt will ambulate >200' over unlevel paved and grass surfaces  with intermittent functional head turns with no overt LOB and no more than 2-point increase in subjective dizziness.     Baseline Requires (S) due to gait instability with functional head turns.   Status Not Met   PT LONG TERM GOAL #3   Title Pt will independently ambulate 100' with concurrent horizontal head turns with no overt LOB to enable pt to safely scan grocery store aisle.    Baseline Requires (S) due to gait instability with functional head turns.   Status Not Met   PT LONG TERM GOAL #4   Title Pt will improve DHI from 18 to 0 to indicate decreased pt-perceived disability due to dizziness.    Baseline 5/23: deferred, as pt continues to experience dizziness, but now occurs with prolonged standing without head/body movement.    Status Deferred               Plan - 10/31/15 1918    Clinical Impression Statement Pt arrived to session reporting increased dizziness with prolonged standing without head/body movement, which is not consistent with dizziness of vestibular origin. Therefore, performed Sensory Organization Test (SOT), which revealed the following:  Composite score = 12% compared to age/height normative value of 72%. Sensory analysis: Vestibular 1% as compared with age/height normative value of 55%; Visual 1% as compared with age/height normative value of 74%; Somatosensory: 22% as compared with age/height normative value of 88%. SOT findings are very inconsistent with patient's functional status. Moreover, the following SOT findings are atypical: continuous A/P postural sway during Conditions 1 and 2; inconsistent performance within the same condition; and losses of balance in differing directions. PT explained to patient that based on SOT, pt will not benefit from vestibular PT. Therefore, pt will be discharged from vestibular PT at this time. Pt verbalized understanding and was in full agreement with DC plan.    Consulted and Agree with Plan of Care Patient      Patient  will benefit from skilled therapeutic intervention in order to improve the following deficits and impairments:     Visit Diagnosis: Dizziness and giddiness  Unsteadiness on feet     Problem List Patient Active Problem List   Diagnosis Date Noted  . Hepatitis B 06/14/2014    PHYSICAL THERAPY DISCHARGE SUMMARY  Visits from Start of Care: 3  Current functional level related to goals / functional outcomes: See above goals and outcomes.   Remaining deficits: Pt continues to report dizziness. However, symptoms now occur with prolonged standing without head/body movement, which is not consistent with vestibular etiology.   Education / Equipment: HEP; SOT findings and functional implications.  Plan: Patient agrees to discharge.  Patient goals were not met. Patient is being discharged due to lack of progress.         Billie Ruddy, PT, DPT [facility] [address] [place], [place], [address] Phone: [phone]   Fax:  [phone] 10/31/2015, 7:34 PM  Name: Kim Pacheco MRN: [id number] Date of Birth: 1985/11/27

## 2015-10-31 NOTE — Telephone Encounter (Signed)
Is she having vertigo again? If so, ok to reorder if needed/set up. Thanks.

## 2015-10-31 NOTE — Telephone Encounter (Signed)
Pt cancel her MRI in March and is calling back today to ask if the MRI can be rescheduled for her

## 2015-11-02 ENCOUNTER — Telehealth: Payer: Self-pay | Admitting: *Deleted

## 2015-11-02 DIAGNOSIS — R42 Dizziness and giddiness: Secondary | ICD-10-CM

## 2015-11-02 NOTE — Telephone Encounter (Signed)
I called the pt and informed her of the message below and she is aware the referral for the MRI was entered and now a referral to a neurologist.  She is aware to expect a call with appt info.

## 2015-11-02 NOTE — Telephone Encounter (Signed)
-----   Message from [name] Kim, DO sent at 11/01/2015  2:01 PM EDT ----- Regarding: FW: PT discharge Please let her know, since having dizziness again, and given PT notes,  I do advise the MRI and neurology consult. Please place referral. ----- Message -----    From: Kim A Pacheco, PT    Sent: 10/31/2015   7:40 PM      To: [name] Kim, DO Subject: PT discharge                                   Dr. Kim,  I just wanted to give you further explanation about Kim Pacheco's discharge from PT.  Initial evaluation findings were consistent with vestibular hypofunction. The patient initially progressed as I would expect. Then, the patient began reporting visual changes and dizziness at rest.  Today, I completed the Sensory Organization Test (SOT), which is an objective measurement of how effectively a patient uses vestibular input for balance. Kim Pacheco's SOT score was extremely low (more consistent with a patient who requires hands-on assistance or a walker to ambulate). My discharge summary has more detail on SOT findings, if needed.  I consulted my colleagues about this case to make sure I wasn't missing anything. I don't doubt that Kim Pacheco perceives imbalance or disequilibrium; but I don't think she will benefit from vestibular rehab at this time.   Thanks again for this referral and I'm sorry I wasn't able to help this patient. Don't hesitate to contact me with questions.  Best,  Kim Pacheco, PT, DPT [facility] [address] [place], [place], [address] Phone: [phone]   Fax:  [phone] 10/31/2015, 7:58 PM   

## 2015-11-02 NOTE — Telephone Encounter (Signed)
I left a message for the pt to return my call. 

## 2015-11-02 NOTE — Telephone Encounter (Signed)
-----   Message from Terressa KoyanagiHannah R Kim, DO sent at 11/01/2015  2:01 PM EDT ----- Regarding: FW: PT discharge Please let her know, since having dizziness again, and given PT notes,  I do advise the MRI and neurology consult. Please place referral. ----- Message -----    From: Calvert CantorBlair A Hobble, PT    Sent: 10/31/2015   7:40 PM      To: Terressa KoyanagiHannah R Kim, DO Subject: PT discharge                                   Dr. [name],  I just wanted to give you further explanation about Ms. Sollecito's discharge from PT.  Initial evaluation findings were consistent with vestibular hypofunction. The patient initially progressed as I would expect. Then, the patient began reporting visual changes and dizziness at rest.  Today, I completed the Sensory Organization Test (SOT), which is an objective measurement of how effectively a patient uses vestibular input for balance. Ms. Sollecito's SOT score was extremely low (more consistent with a patient who requires hands-on assistance or a walker to ambulate). My discharge summary has more detail on SOT findings, if needed.  I consulted my colleagues about this case to make sure I wasn't missing anything. I don't doubt that Ms. Sollecito perceives imbalance or disequilibrium; but I don't think she will benefit from vestibular rehab at this time.   Thanks again for this referral and I'm sorry I wasn't able to help this patient. Don't hesitate to contact me with questions.  Best,  Jorje GuildBlair Hobble, PT, DPT [facility] [address] [place], [place], [address] Phone: [phone]   Fax:  [phone] 10/31/2015, 7:58 PM

## 2015-11-08 ENCOUNTER — Other Ambulatory Visit: Payer: Self-pay

## 2015-11-08 ENCOUNTER — Encounter: Payer: BLUE CROSS/BLUE SHIELD | Admitting: Physical Therapy

## 2015-11-08 ENCOUNTER — Telehealth: Payer: Self-pay | Admitting: Family Medicine

## 2015-11-08 DIAGNOSIS — H6982 Other specified disorders of Eustachian tube, left ear: Secondary | ICD-10-CM

## 2015-11-08 MED ORDER — MOMETASONE FUROATE 50 MCG/ACT NA SUSP: 2.0000 | Freq: Every day | NASAL | Status: DC

## 2015-11-08 NOTE — Telephone Encounter (Signed)
Ok to refill 

## 2015-11-08 NOTE — Telephone Encounter (Signed)
Pt need new Rx for Nasonex.   [facility]  [facility]

## 2015-11-08 NOTE — Telephone Encounter (Signed)
Rx refilled.

## 2015-11-09 ENCOUNTER — Ambulatory Visit
Admission: RE | Admit: 2015-11-09 | Discharge: 2015-11-09 | Disposition: A | Discharge status: Home / Self Care | Payer: BLUE CROSS/BLUE SHIELD | Source: Ambulatory Visit | Attending: Family Medicine | Admitting: Family Medicine

## 2015-11-09 DIAGNOSIS — R42 Dizziness and giddiness: Secondary | ICD-10-CM

## 2015-11-09 DIAGNOSIS — I69998 Other sequelae following unspecified cerebrovascular disease: Secondary | ICD-10-CM | POA: Diagnosis not present

## 2015-11-09 MED ORDER — GADOBENATE DIMEGLUMINE 529 MG/ML IV SOLN
10.0000 mL | Freq: Once | INTRAVENOUS | Status: AC | PRN
  Administered 2015-11-09: 10 mL via INTRAVENOUS

## 2015-11-13 ENCOUNTER — Ambulatory Visit (INDEPENDENT_AMBULATORY_CARE_PROVIDER_SITE_OTHER): Payer: BLUE CROSS/BLUE SHIELD | Admitting: Family Medicine

## 2015-11-13 ENCOUNTER — Encounter: Payer: Self-pay | Admitting: Family Medicine

## 2015-11-13 VITALS — BP 98/70 | HR 86 | Temp 98.3°F | Ht 59.75 in | Wt 122.1 lb

## 2015-11-13 DIAGNOSIS — M79646 Pain in unspecified finger(s): Secondary | ICD-10-CM | POA: Diagnosis not present

## 2015-11-13 NOTE — Patient Instructions (Signed)
Ibuprofen or aleve 1-2 times daily for a few days with flares of finger pain.  Ice after photo shoot if sore.  Gentle stretching.  Follow up if worsens, persistent or catching/locking.

## 2015-11-13 NOTE — Progress Notes (Signed)
Pre visit review using our clinic review tool, if applicable. No additional management support is needed unless otherwise documented below in the visit note. 

## 2015-11-13 NOTE — Progress Notes (Signed)
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 She was on benadryl when she saw her physical therapist and reports she thinks this caused her balance issues at the time. She does still have some vertigo a few times per week.   ROS: See pertinent positives and negatives per HPI.  Past Medical History  Diagnosis Date  . Diabetes mellitus without complication (HCC)     gestational, no issues since pregnancy per her report  . Abnormal Pap smear     normal 06/2013  . Depression     suicide attempt in college  . Hepatitis     B carrier, managed by Dr. Kinnie ScalesMedoff  . History of chicken pox   . History of UTI     Past Surgical History  Procedure Laterality Date  . Colposcopy    . Cesarean section N/A 01/26/2013    Procedure: Primary Cesarean Section Delivery Baby  Boy @ 0017, Apgars 4/8/9;  Surgeon: Freddrick MarchKendra H. Tenny Crawoss, MD;  Location: [facility];  Service: Obstetrics;  Laterality: N/A;  . Nasal sinus surgery      Family History  Problem Relation Age of Onset  . Adopted: Yes    Social History   Social History  . Marital Status: Married    Spouse Name: N/A  . Number of Children: N/A  . Years of Education: N/A   Social History Main Topics  . Smoking status: Former Smoker -- 1.00 packs/day    Types: Cigarettes  . Smokeless tobacco: Never Used     Comment: smoked for 8 years, quit in 2013  . Alcohol Use: No  . Drug Use: No  . Sexual Activity: Yes    Birth Control/ Protection: Pill   Other Topics Concern  . 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  mometasone (NASONEX) 50 MCG/ACT nasal spray, Place 2 sprays into the nose daily., Disp: 17 g, Rfl: 2 .  PRESCRIPTION MEDICATION, OCP (name unknown), Disp: , Rfl:  .  tenofovir (VIREAD) 300 MG tablet, Take 300 mg by mouth daily. Given by Dr Kinnie ScalesMedoff, Disp: , Rfl:   EXAM:  Filed Vitals:   11/13/15 1428  BP: 98/70  Pulse: 86  Temp: 98.3 F (36.8 C)    Body mass index is 24.03 kg/(m^2).  GENERAL: vitals reviewed and listed above, alert, oriented, appears well hydrated and in no acute distress  HEENT: atraumatic, conjunttiva clear, no obvious abnormalities on inspection of external nose and ears  NECK: no obvious masses on inspection  LUNGS: clear to auscultation bilaterally, no wheezes, rales or rhonchi, good air movement  CV: HRRR, no peripheral edema  MS: moves all extremities without noticeable abnormality; Normal inspection of both hands and wrists and forearms, normal strength, sensitivity to light touch and range of motion in both hands wrists and forearms. Tenderness  to palpation over the flexor tendons of the second digit of the right hand just over the MCP joint region, questionable nodule in this region, no clicking or catching at this time. Refill normal.  PSYCH: pleasant and cooperative, no obvious depression or anxiety  ASSESSMENT AND PLAN:  Discussed the following assessment and plan:  Pain of finger, unspecified laterality  -Suspect tendinitis or mild tenosynovitis -Treat conservatively with ice after activity, ibuprofen or Aleve 1-2 times daily for a few days with flares of pain, gentle stretching and finger splint for 1 week if needed -Sports medicine evaluation if these measures do not work -I'm glad she is scheduled to see neurology for her ongoing vertigo; reviewed MRI report and she will refill and discuss this further with neurology -Patient advised  to return or notify a doctor immediately if symptoms worsen or persist or new concerns arise.  Patient Instructions  Ibuprofen or aleve 1-2 times daily for a few days with flares of finger pain.  Ice after photo shoot if sore.  Gentle stretching.  Follow up if worsens, persistent or catching/locking.     [name].

## 2015-11-15 ENCOUNTER — Telehealth: Payer: Self-pay | Admitting: Family Medicine

## 2015-11-15 MED ORDER — MOMETASONE FUROATE 50 MCG/ACT NA SUSP: 2.0000 | Freq: Every day | NASAL | Status: DC

## 2015-11-15 NOTE — Telephone Encounter (Signed)
OK 

## 2015-11-15 NOTE — Telephone Encounter (Signed)
Okay to switch for this patient, please review in Dr. Reuel DerbyKims absence. Thanks.

## 2015-11-15 NOTE — Telephone Encounter (Signed)
Pt ins will covered mometasone furoate [facility] [address]. Pt can not afford nasonex

## 2015-11-15 NOTE — Telephone Encounter (Signed)
Looks like generic was sent in. I resent in the generic.

## 2015-12-05 ENCOUNTER — Other Ambulatory Visit: Payer: Self-pay | Admitting: Adult Health

## 2015-12-05 MED ORDER — FLUTICASONE PROPIONATE 50 MCG/ACT NA SUSP: 2.0000 | Freq: Every day | NASAL | Status: DC

## 2016-01-03 ENCOUNTER — Other Ambulatory Visit: Payer: Self-pay | Admitting: Gastroenterology

## 2016-01-03 ENCOUNTER — Encounter: Payer: Self-pay | Admitting: Neurology

## 2016-01-03 ENCOUNTER — Ambulatory Visit (INDEPENDENT_AMBULATORY_CARE_PROVIDER_SITE_OTHER): Payer: BLUE CROSS/BLUE SHIELD | Admitting: Neurology

## 2016-01-03 VITALS — BP 110/70 | HR 75 | Ht 59.75 in | Wt 122.0 lb

## 2016-01-03 DIAGNOSIS — R42 Dizziness and giddiness: Secondary | ICD-10-CM | POA: Diagnosis not present

## 2016-01-03 DIAGNOSIS — B1910 Unspecified viral hepatitis B without hepatic coma: Secondary | ICD-10-CM

## 2016-01-03 NOTE — Progress Notes (Signed)
NEUROLOGY CONSULTATION NOTE  Kim Pacheco MRN: [id number] DOB: 05/25/1986  Referring provider: Dr. Selena Pacheco Primary care provider: Dr. Selena Pacheco  Reason for consult:  dizziness  HISTORY OF PRESENT ILLNESS: Kim Pacheco is a 30 year old right-handed woman with history of depression, gestational diabetes and hepatitis B who presents for dizziness.  History obtained by patient, rehab notes and PCP notes.  She began experiencing "vertigo" after the birth of her son in August 2014.  She describes it as sense of standing on a boat, but not spinning or lightheadedness.  It is brief and occurs with quick movements of the head or staring at patterns, lasting only a few seconds.  There is no associated nausea.  Frequency is uncertain.    A few months ago, she had an episode associated with falling in the kitchen.  The spells have become more frequent, occurring every 2 to 3 days.  She has not had a recurrent fall.  She denies tinnitus, hearing loss, or nausea.  On one occasion, while staring at the letter A on the [facility] chart, she noticed a shadow around the eye, only when looking with the left eye.  It occurred during a dizzy spell and was brief.  Otherwise, no diplopia or other visual disturbance.  She has occasional headache but not associated with these events.  She denies history of migraine.  She went to vestibular rehabilitation.  It was not effective.  A Sensory Organization Test was performed.  The score was low, reportedly consistent with a patient "who requires hands-on assistance or a walker to ambulate" and it was determined that vestibular rehab would no longer be effective.    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  Since the birth of her son, she has had some mild stress.  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  PAST MEDICAL  HISTORY: Past Medical History:  Diagnosis Date  . Abnormal Pap smear    normal 06/2013  . Depression    suicide attempt in college  . Diabetes mellitus without complication (HCC)    gestational, no issues since pregnancy per her report  . Hepatitis    B carrier, managed by Kim. Kinnie Pacheco  . History of chicken pox   . History of UTI     PAST SURGICAL HISTORY: Past Surgical History:  Procedure Laterality Date  . CESAREAN SECTION N/A 01/26/2013   Procedure: Primary Cesarean Section Delivery Baby  Boy @ 0017, Apgars 4/8/9;  Surgeon: Kim March. Tenny Craw, MD;  Location: [facility];  Service: Obstetrics;  Laterality: N/A;  . COLPOSCOPY    . NASAL SINUS SURGERY      MEDICATIONS: Current Outpatient Prescriptions on File Prior to Visit  Medication Sig Dispense Refill  . fluticasone (FLONASE) 50 MCG/ACT nasal spray Place 2 sprays into both nostrils daily. 16 g 6  . mometasone (NASONEX) 50 MCG/ACT nasal spray Place 2 sprays into the nose daily. 17 g 5  . tenofovir (VIREAD) 300 MG tablet Take 300 mg by mouth daily. Given by Kim Pacheco     No current facility-administered medications on file prior to visit.     ALLERGIES: No Known Allergies  FAMILY HISTORY: Family History  Problem Relation Age of Onset  . Adopted: Yes    SOCIAL HISTORY: Social History   Social History  . Marital status: Married    Spouse name: N/A  .  Number of children: 1  . Years of education: college   Occupational History  . photographer    Social History Main Topics  . Smoking status: Former Smoker    Packs/day: 1.00    Types: Cigarettes  . Smokeless tobacco: Never Used     Comment: smoked for 8 years, quit in 2013  . Alcohol use No  . Drug use: No  . Sexual activity: Yes    Birth control/ protection: Pill   Other Topics Concern  . 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      Home Situation: lives with husband, son born in 01/2013      Spiritual Beliefs: none       Lifestyle: no regular exercise; diet is fair - american diet       REVIEW OF SYSTEMS: Constitutional: No fevers, chills, or sweats, no generalized fatigue, change in appetite Eyes: No visual changes, double vision, eye pain Ear, nose and throat: No hearing loss, ear pain, nasal congestion, sore throat Cardiovascular: No chest pain, palpitations Respiratory:  No shortness of breath at rest or with exertion, wheezes GastrointestinaI: No nausea, vomiting, diarrhea, abdominal pain, fecal incontinence Genitourinary:  No dysuria, urinary retention or frequency Musculoskeletal:  No neck pain, back pain Integumentary: No rash, pruritus, skin lesions Neurological: as above Psychiatric: No depression, insomnia, anxiety Endocrine: No palpitations, fatigue, diaphoresis, mood swings, change in appetite, change in weight, increased thirst Hematologic/Lymphatic:  No purpura, petechiae. Allergic/Immunologic: no itchy/runny eyes, nasal congestion, recent allergic reactions, rashes  PHYSICAL EXAM: Vitals:   01/03/16 0945  BP: 110/70  Pulse: 75   General: No acute distress.  Patient appears well-groomed.  Head:  Normocephalic/atraumatic Eyes:  fundi examined but not visualized Neck: supple, no paraspinal tenderness, full range of motion Back: No paraspinal tenderness Heart: regular rate and rhythm Lungs: Clear to auscultation bilaterally. Vascular: No carotid bruits. Neurological Exam: Mental status: alert and oriented to person, place, and time, recent and remote memory intact, fund of knowledge intact, attention and concentration intact, speech fluent and not dysarthric, language intact. Cranial nerves: CN I: not tested CN II: pupils equal, round and reactive to light, visual fields intact CN III, IV, VI:  full range of motion, no nystagmus, no ptosis CN V: facial sensation intact CN VII: upper and lower face symmetric CN VIII: hearing intact CN IX, X: gag intact, uvula midline CN  XI: sternocleidomastoid and trapezius muscles intact CN XII: tongue midline Bulk & Tone: normal, no fasciculations. Motor:  5/5 throughout  Sensation: temperature and vibration sensation intact. Deep Tendon Reflexes:  2+ throughout, toes downgoing.  Finger to nose testing:  Without dysmetria.  Heel to shin:  Without dysmetria.  Gait:  Normal station and stride.  Able to turn and tandem walk. Romberg negative.  IMPRESSION: Dizziness.  Uncertain etiology.  It is still positional and not episodic.  It is not classic BPPV.  It does not correlate with migraine or Meniere's.  There is nothing on brain MRI to explain her symptoms.  Anxiety may be a cause, but that is a diagnosis of exclusion.  PLAN: 1.  We will check audiometry and VNG testing.   2.  Follow up afterwards  Thank you for allowing me to take part in the care of this patient.  Shon Millet, DO  CC: Kriste Basque, DO

## 2016-01-03 NOTE — Addendum Note (Signed)
Addended by: Sheilah Mins A on: 01/03/2016 11:12 AM   Modules accepted: Orders

## 2016-01-03 NOTE — Patient Instructions (Signed)
I don't see anything in the brain that would be causing your dizziness.  I don't think it is migraine.  It doesn't sound like Meniere's but further testing, such as hearing test, would be necessary. I recommend audiometric testing and VNG testing (videonystagmography testing).  Follow up afterwards.

## 2016-01-15 ENCOUNTER — Ambulatory Visit
Admission: RE | Admit: 2016-01-15 | Discharge: 2016-01-15 | Disposition: A | Discharge status: Home / Self Care | Payer: BLUE CROSS/BLUE SHIELD | Source: Ambulatory Visit | Attending: Gastroenterology | Admitting: Gastroenterology

## 2016-01-15 DIAGNOSIS — B1910 Unspecified viral hepatitis B without hepatic coma: Secondary | ICD-10-CM

## 2016-01-15 DIAGNOSIS — K824 Cholesterolosis of gallbladder: Secondary | ICD-10-CM | POA: Diagnosis not present

## 2016-02-15 DIAGNOSIS — R42 Dizziness and giddiness: Secondary | ICD-10-CM | POA: Diagnosis not present

## 2016-02-16 ENCOUNTER — Telehealth: Payer: Self-pay | Admitting: Family Medicine

## 2016-02-16 DIAGNOSIS — B181 Chronic viral hepatitis B without delta-agent: Secondary | ICD-10-CM | POA: Diagnosis not present

## 2016-02-16 NOTE — Telephone Encounter (Signed)
Per [facility] notes, patient was scheduled to contact us about dizziness symptoms. Please advise as patient states [per [facility]] she is not worse today and symptoms present since January. Uncertain if clarification is needed by patient since referrals have been set up.

## 2016-02-16 NOTE — Telephone Encounter (Signed)
TELEPHONE ADVICE RECORD [facility]  Patient Name: Kim GraveMARIA Pacheco  DOB: 10/24/1985    Initial Comment Caller states she has been dizziness since January.    Nurse Assessment  Nurse: Odis LusterBowers, RN, Bjorn Loserhonda Date/Time (Eastern Time): 02/16/2016 3:26:19 PM  Confirm and document reason for call. If symptomatic, describe symptoms. You must click the next button to save text entered. ---Caller states she has been dizziness since January. Saw PCP who referred her for an outpt center who then referred her to neurologist who referred her to the ENT. Was told to call her PCP for the next step. Reports that her dizziness is not worse today.  Has the patient traveled out of the country within the last 30 days? ---Not Applicable  Does the patient have any new or worsening symptoms? ---Yes  Will a triage be completed? ---Yes  Related visit to physician within the last 2 weeks? ---Yes  Does the PT have any chronic conditions? (i.e. diabetes, asthma, etc.) ---Yes  List chronic conditions. ---Hep B carrier  Is the patient pregnant or possibly pregnant? (Ask all females between the ages of 1212-55) ---No  Is this a behavioral health or substance abuse call? ---No     Guidelines    Guideline Title Affirmed Question Affirmed Notes  Dizziness - Lightheadedness [1] MILD dizziness (e.g., walking normally) AND [2] has been evaluated by physician for this    Final Disposition User   See PCP within 2 Blima DessertWeeks Bowers, RN, Bjorn Loserhonda    Comments  Scheduled caller with Dr. [name] at the [place] location on 03/05/16 @ 8:30am.   Referrals  REFERRED TO PCP OFFICE   Disagree/Comply: Comply

## 2016-02-19 ENCOUNTER — Telehealth: Payer: Self-pay | Admitting: Family Medicine

## 2016-02-19 NOTE — Telephone Encounter (Signed)
Paper copy addressed.

## 2016-02-19 NOTE — Telephone Encounter (Signed)
Pt is returning joann call °

## 2016-02-19 NOTE — Telephone Encounter (Signed)
Per Dr [name] I called the pt and informed her Dr [name] received the message from the triage nurse last week and she should contact the specialist to see what they recommend.  Patient stated she already talked to the ENT and they told her there was nothing they could do and I advised her per Dr [name] to contact the neurologist and she agreed.

## 2016-02-26 ENCOUNTER — Ambulatory Visit (INDEPENDENT_AMBULATORY_CARE_PROVIDER_SITE_OTHER): Payer: BLUE CROSS/BLUE SHIELD | Admitting: Family Medicine

## 2016-02-26 ENCOUNTER — Encounter: Payer: Self-pay | Admitting: Family Medicine

## 2016-02-26 VITALS — BP 112/80 | HR 75 | Temp 98.1°F | Ht 59.75 in | Wt 119.5 lb

## 2016-02-26 DIAGNOSIS — M25512 Pain in left shoulder: Secondary | ICD-10-CM

## 2016-02-26 DIAGNOSIS — R112 Nausea with vomiting, unspecified: Secondary | ICD-10-CM | POA: Diagnosis not present

## 2016-02-26 DIAGNOSIS — B181 Chronic viral hepatitis B without delta-agent: Secondary | ICD-10-CM

## 2016-02-26 DIAGNOSIS — Z3200 Encounter for pregnancy test, result unknown: Secondary | ICD-10-CM | POA: Diagnosis not present

## 2016-02-26 LAB — POCT URINE PREGNANCY: Preg Test, Ur: NEGATIVE

## 2016-02-26 MED ORDER — ONDANSETRON HCL 4 MG PO TABS: 4.0000 mg | ORAL_TABLET | Freq: Three times a day (TID) | ORAL | 0 refills | Status: DC | PRN

## 2016-02-26 MED ORDER — CYCLOBENZAPRINE HCL 5 MG PO TABS: 5.0000 mg | ORAL_TABLET | Freq: Three times a day (TID) | ORAL | 0 refills | Status: DC | PRN

## 2016-02-26 NOTE — Progress Notes (Signed)
uriPre visit review using our clinic review tool, if applicable. No additional management support is needed unless otherwise documented below in the visit note.

## 2016-02-26 NOTE — Progress Notes (Signed)
HPI:  Acute visit for several issues:  NV: -today, mild -last period about 1-2 weeks ago a little light -on ocps, but wonders if pregnant -no fevers, diarrhea, abd pain, hematochezia, rash, tick bite -did have cold this last week too -just saw GI and had regular labs, has regular follow up with GI next week  L shoulder pain: -carried 2.32 yo [place] all day last weekend and developed sorness in L shoulder, and arm -denies weakness, numbness, fevers  ROS: See pertinent positives and negatives per HPI.  Past Medical History:  Diagnosis Date  . Abnormal Pap smear    normal 06/2013  . Depression    suicide attempt in college  . Diabetes mellitus without complication (HCC)    gestational, no issues since pregnancy per her report  . Hepatitis    B carrier, managed by Dr. Kinnie ScalesMedoff  . History of chicken pox   . History of UTI     Past Surgical History:  Procedure Laterality Date  . CESAREAN SECTION N/A 01/26/2013   Procedure: Primary Cesarean Section Delivery Baby  Boy @ 0017, Apgars 4/8/9;  Surgeon: Freddrick MarchKendra H. Tenny Crawoss, MD;  Location: [facility];  Service: Obstetrics;  Laterality: N/A;  . COLPOSCOPY    . NASAL SINUS SURGERY      Family History  Problem Relation Age of Onset  . Adopted: Yes    Social History   Social History  . Marital status: Married    Spouse name: N/A  . Number of children: 1  . Years of education: college   Occupational History  . photographer    Social History Main Topics  . Smoking status: Former Smoker    Packs/day: 1.00    Types: Cigarettes  . Smokeless tobacco: Never Used     Comment: smoked for 8 years, quit in 2013  . Alcohol use No  . Drug use: No  . Sexual activity: Yes    Birth control/ protection: Pill   Other Topics Concern  . 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  fluticasone (FLONASE) 50 MCG/ACT nasal spray, Place 2 sprays into both nostrils daily., Disp: 16 g, Rfl: 6 .  mometasone (NASONEX) 50 MCG/ACT nasal spray, Place 2 sprays into the nose daily., Disp: 17 g, Rfl: 5 .  tenofovir (VIREAD) 300 MG tablet, Take 300 mg by mouth daily. Given by Dr Kinnie ScalesMedoff, Disp: , Rfl:  .  cyclobenzaprine (FLEXERIL) 5 MG tablet, Take 1 tablet (5 mg total) by mouth 3 (three) times daily as needed for muscle spasms., Disp: 20 tablet, Rfl: 0 .  ondansetron (ZOFRAN) 4 MG tablet, Take 1 tablet (4 mg total) by mouth every 8 (eight) hours as needed for nausea or vomiting., Disp: 20 tablet, Rfl: 0  EXAM:  Vitals:   02/26/16 1401  BP: 112/80  Pulse: 75  Temp: 98.1 F (36.7 C)    Body mass index is 23.53 kg/m.  GENERAL: vitals reviewed and listed above, alert, oriented, appears well hydrated and in no acute distress  HEENT: atraumatic, conjunttiva clear, no obvious abnormalities on inspection of external nose and ears  NECK: no obvious masses on inspection  LUNGS: clear to  auscultation bilaterally, no wheezes, rales or rhonchi, good air movement  CV: HRRR, no peripheral edema  ABD: BS+, soft, NTTP  MS: moves all extremities without noticeable abnormality, normal inspection of head and neck, TTP in multiple muscle groups (traps, RTC, biceps), normal strength and sensitivity to light touch in bilat UEs, normal ROM head and neck  PSYCH: pleasant and cooperative, no obvious depression or anxiety  ASSESSMENT AND PLAN:  Discussed the following assessment and plan:  Non-intractable vomiting with nausea, vomiting of unspecified type  Left shoulder pain  Hepatitis B carrier  Pregnancy examination or test, pregnancy unconfirmed - Plan: POCT urine pregnancy  -upreg neg, likely mild gastroenteritis given has child in preschool, what sounds like viral illness going around house,  zofran and fluids advised -had labs and has close follow up with GI, advised follow up with GI sooner if NV symptoms persist or worsen -for shoulder pain, suspect muscular - opted for heat, nsaid prn, top tx, muscle relaxer, HEP close follow up to ensure improving -home exercises, heat, muscle relaxer as needed for muscle soreness -Patient advised to return or notify a doctor immediately if symptoms worsen or persist or new concerns arise.  Patient Instructions  BEFORE YOU LEAVE: -follow up: 1 month -upper back exercises  For the shoulder pain: -heat for 15 minutes twice daily -muscle relaxer at night if needed -topical over the counter sports creams or aleve per instructions if needed for pain -exercises provided 4 days per week  For the nausea and vomiting: -plenty of fluids -zofran per instructions if needed -follow up with your gastroenterologist as planned and sooner if symptoms worsen or persist    KIM, Dahlia Client R., DO

## 2016-02-26 NOTE — Patient Instructions (Signed)
BEFORE YOU LEAVE: -follow up: 1 month -upper back exercises  For the shoulder pain: -heat for 15 minutes twice daily -muscle relaxer at night if needed -topical over the counter sports creams or aleve per instructions if needed for pain -exercises provided 4 days per week  For the nausea and vomiting: -plenty of fluids -zofran per instructions if needed -follow up with your gastroenterologist as planned and sooner if symptoms worsen or persist

## 2016-03-05 ENCOUNTER — Ambulatory Visit: Payer: Self-pay | Admitting: Family Medicine

## 2016-03-15 DIAGNOSIS — B181 Chronic viral hepatitis B without delta-agent: Secondary | ICD-10-CM | POA: Diagnosis not present

## 2016-03-27 NOTE — Progress Notes (Signed)
  HPI:  Follow up:  L shoulder pain: -started 02/2016 after carrying 2.32 yo  -HEP, muslce relaxer, prn analgesics/symptomatic care advised initially -reports: symptoms resolved completely and feels great -denies: shoulder pain, weakness, numbness  NV/Hep b: -sees GI for treatment  -[facility]  -feels well, no nausea, vomiting, abd pani  ROS: See pertinent positives and negatives per HPI.  Past Medical History:  Diagnosis Date  . Abnormal Pap smear    normal 06/2013  . Depression    suicide attempt in college  . Diabetes mellitus without complication (HCC)    gestational, no issues since pregnancy per her report  . Hepatitis    Hep B, managed by Dr. Kinnie ScalesMedoff  . History of chicken pox   . History of UTI     Past Surgical History:  Procedure Laterality Date  . CESAREAN SECTION N/A 01/26/2013   Procedure: Primary Cesarean Section Delivery Baby  Boy @ 0017, Apgars 4/8/9;  Surgeon: Freddrick MarchKendra H. Tenny Crawoss, MD;  Location: [facility];  Service: Obstetrics;  Laterality: N/A;  . COLPOSCOPY    . NASAL SINUS SURGERY      Family History  Problem Relation Age of Onset  . Adopted: Yes    Social History   Social History  . Marital status: Married    Spouse name: N/A  . Number of children: 1  . Years of education: college   Occupational History  . photographer    Social History Main Topics  . Smoking status: Former Smoker    Packs/day: 1.00    Types: Cigarettes  . Smokeless tobacco: Never Used     Comment: smoked for 8 years, quit in 2013  . Alcohol use No  . Drug use: No  . Sexual activity: Yes    Birth control/ protection: Pill   Other Topics Concern  . 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   fluticasone (FLONASE) 50 MCG/ACT nasal spray, Place 2 sprays into both nostrils daily., Disp: 16 g, Rfl: 6 .  Tenofovir Alafenamide Fumarate (VEMLIDY PO), Take by mouth., Disp: , Rfl:   EXAM:  Vitals:   03/28/16 0909  BP: 96/64  Pulse: 87    Body mass index is 23.16 kg/m.  GENERAL: vitals reviewed and listed above, alert, oriented, appears well hydrated and in no acute distress  HEENT: atraumatic, conjunttiva clear, no obvious abnormalities on inspection of external nose and ears  NECK: no obvious masses on inspection  LUNGS: clear to auscultation bilaterally, no wheezes, rales or rhonchi, good air movement  CV: HRRR, no peripheral edema  MS: moves all extremities without noticeable abnormality  PSYCH: pleasant and cooperative, no obvious depression or anxiety  ASSESSMENT AND PLAN:  Discussed the following assessment and plan:  Acute pain of left shoulder  -symptoms resolved -advised continue exercises a few days per week for prevention -Patient advised to return or notify a doctor immediately if symptoms recur or new concerns arise.  There are no Patient Instructions on file for this visit.  [name], Eathel Pajak R., DO

## 2016-03-28 ENCOUNTER — Encounter: Payer: Self-pay | Admitting: Family Medicine

## 2016-03-28 ENCOUNTER — Ambulatory Visit (INDEPENDENT_AMBULATORY_CARE_PROVIDER_SITE_OTHER): Payer: BLUE CROSS/BLUE SHIELD | Admitting: Family Medicine

## 2016-03-28 ENCOUNTER — Telehealth: Payer: Self-pay | Admitting: Family Medicine

## 2016-03-28 VITALS — BP 96/64 | HR 87 | Temp 98.3°F | Ht 59.75 in | Wt 117.6 lb

## 2016-03-28 DIAGNOSIS — Z23 Encounter for immunization: Secondary | ICD-10-CM

## 2016-03-28 DIAGNOSIS — M25512 Pain in left shoulder: Secondary | ICD-10-CM | POA: Diagnosis not present

## 2016-03-28 NOTE — Progress Notes (Signed)
Pre visit review using our clinic review tool, if applicable. No additional management support is needed unless otherwise documented below in the visit note. 

## 2016-03-28 NOTE — Telephone Encounter (Signed)
Attempted to contact patient. Left voicemail to call office back. Lupita LeashDonna, if return calls, please triage and schedule as appropriate.

## 2016-03-28 NOTE — Telephone Encounter (Signed)
Pt state that her arm is very painful and can not lift it over her head and this is the first time that this has happen to her and would like to know what to do.  Refuse [facility].

## 2016-03-28 NOTE — Telephone Encounter (Signed)
Please see if michelle can speak with her? Is this related to a vaccine? She was fine at her visit. Thanks.

## 2016-03-28 NOTE — Telephone Encounter (Signed)
I called the pt and informed her Dr [name] reviewed the message and advised for her to go an urgent care or we could see her in the office tomorrow.  Patient stated she is feeling a lot better and she can lift her arm above her head now.  Stated she noticed this will driving around town.  I advised her to call back for an appt if needed and she agreed.

## 2016-04-10 ENCOUNTER — Telehealth: Payer: Self-pay

## 2016-04-10 ENCOUNTER — Telehealth: Payer: Self-pay | Admitting: Family Medicine

## 2016-04-10 DIAGNOSIS — R42 Dizziness and giddiness: Secondary | ICD-10-CM

## 2016-04-10 NOTE — Telephone Encounter (Signed)
I reviewed Dr. Tona SensingByer's note.  I think she should see a specialist in vestibular disorders.  It would likely require going to an academic center, but I am not aware of anyplace specifically that offers that service.

## 2016-04-10 NOTE — Telephone Encounter (Signed)
Pt called to schedule appointment for f/u after ENT. Pt scheduled for next Wednesday. ([facility] notes in [facility]). Pt stated that ENT found no reason for dizziness. Wanted to know if it could possibly be cause by back issues. Pt reports she has had trouble with her back since she was ~15 or 16. Please advise.

## 2016-04-10 NOTE — Telephone Encounter (Signed)
Message relayed to patient. Verbalized understanding and denied questions.  Forwarded conversation on to PCP at Pt's request.

## 2016-04-10 NOTE — Telephone Encounter (Signed)
Left message on machine for pt to return call to the office.  

## 2016-04-10 NOTE — Telephone Encounter (Signed)
Pt stated that she saw the neurologist and they do not think that the dizziness is in reference to the nerves.  Pt would like to know what the next step is.

## 2016-04-10 NOTE — Telephone Encounter (Signed)
I don't think so.  To be honest, I don't have a neurologic explanation for her symptoms, so I am not sure if follow up would be needed.

## 2016-04-11 NOTE — Telephone Encounter (Signed)
I looked online and found [facility] and called them and the receptionist stated they can treat this disorder.  called the pt and informed her of the message below, the referral was entered and she is aware someone will cal with appt info.

## 2016-04-11 NOTE — Addendum Note (Signed)
Addended by: Johnella MoloneyFUNDERBURK, JO A on: 04/11/2016 09:51 AM   Modules accepted: Orders

## 2016-04-11 NOTE — Telephone Encounter (Signed)
I really am not sure what is causing her dizziness/vertigo. We typically undergo ENT or neurology eval for this complaint and she has had both. It looks like neurologist advised referral to tertiary care center for vestibular disorders evaluation. I am not sure of a specialist in this area. Please have debra check with [facility], [facility] to see if they have such a specialist that we could refer to then offer referral to pt if she wishes. It also could be a side effect to her medication and she could speak with her liver doctor about thsi.

## 2016-04-17 ENCOUNTER — Ambulatory Visit: Payer: BLUE CROSS/BLUE SHIELD | Admitting: Neurology

## 2016-05-31 ENCOUNTER — Ambulatory Visit (INDEPENDENT_AMBULATORY_CARE_PROVIDER_SITE_OTHER): Payer: BLUE CROSS/BLUE SHIELD | Admitting: Family Medicine

## 2016-05-31 ENCOUNTER — Encounter: Payer: Self-pay | Admitting: Family Medicine

## 2016-05-31 VITALS — BP 112/80 | HR 70 | Temp 98.1°F | Ht 59.75 in | Wt 121.5 lb

## 2016-05-31 DIAGNOSIS — R109 Unspecified abdominal pain: Secondary | ICD-10-CM

## 2016-05-31 DIAGNOSIS — K5900 Constipation, unspecified: Secondary | ICD-10-CM

## 2016-05-31 LAB — POCT URINALYSIS DIPSTICK
Bilirubin, UA: NEGATIVE
Glucose, UA: NEGATIVE
Ketones, UA: NEGATIVE
NITRITE UA: NEGATIVE
PH UA: 5
PROTEIN UA: NEGATIVE
RBC UA: NEGATIVE
Spec Grav, UA: 1.02
UROBILINOGEN UA: 0.2

## 2016-05-31 LAB — POCT URINE PREGNANCY: PREG TEST UR: NEGATIVE

## 2016-05-31 NOTE — Progress Notes (Signed)
HPI:  Kim Pacheco is here for an acute visit for constipation. This started a few days ago. She had not had a bowel movement in several days. Did take a stool softener and did have a hard stool and then some pellet stools once. She has had diffuse intermittent abdominal discomfort, some gas, some bloating and some back pain. She had some nausea initially, but this has resolved. She has been able to eat and drink well. Denies fevers, vomiting, chills, melena, hematochezia, dysuria. She did have her period about a week ago, but it was light and only lasted 1 day. She did recently change her birth control with her gynecologist in the last month.  ROS: See pertinent positives and negatives per HPI.  Past Medical History:  Diagnosis Date  . Abnormal Pap smear    normal 06/2013  . Depression    suicide attempt in college  . Diabetes mellitus without complication (HCC)    gestational, no issues since pregnancy per her report  . Hepatitis    Hep B, managed by Dr. Kinnie ScalesMedoff  . History of chicken pox   . History of UTI     Past Surgical History:  Procedure Laterality Date  . CESAREAN SECTION N/A 01/26/2013   Procedure: Primary Cesarean Section Delivery Baby  Boy @ 0017, Apgars 4/8/9;  Surgeon: Freddrick MarchKendra H. Tenny Crawoss, MD;  Location: [facility];  Service: Obstetrics;  Laterality: N/A;  . COLPOSCOPY    . NASAL SINUS SURGERY      Family History  Problem Relation Age of Onset  . Adopted: Yes    Social History   Social History  . Marital status: Married    Spouse name: N/A  . Number of children: 1  . Years of education: college   Occupational History  . photographer    Social History Main Topics  . Smoking status: Former Smoker    Packs/day: 1.00    Types: Cigarettes  . Smokeless tobacco: Never Used     Comment: smoked for 8 years, quit in 2013  . Alcohol use No  . Drug use: No  . Sexual activity: Yes    Birth control/ protection: Pill   Other Topics Concern  . 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  fluticasone (FLONASE) 50 MCG/ACT nasal spray, Place 2 sprays into both nostrils daily., Disp: 16 g, Rfl: 6 .  Tenofovir Alafenamide Fumarate (VEMLIDY PO), Take by mouth., Disp: , Rfl:   EXAM:  Vitals:   05/31/16 1618  BP: 112/80  Pulse: 70  Temp: 98.1 F (36.7 C)    Body mass index is 23.93 kg/m.  GENERAL: vitals reviewed and listed above, alert, oriented, appears well hydrated and in no acute distress  HEENT: atraumatic, conjunttiva clear, no obvious abnormalities on inspection of external nose and ears  NECK: no obvious masses on inspection  LUNGS: clear to auscultation bilaterally, no wheezes, rales or rhonchi, good air movement  CV: HRRR, no peripheral edema  ABD: BS+, soft, NTTP, no rebound or guarding  MS: moves all extremities without noticeable abnormality  PSYCH: pleasant and cooperative, no obvious depression or anxiety  ASSESSMENT AND PLAN:  Discussed the  following assessment and plan:  Constipation, unspecified constipation type  Abdominal pain, unspecified abdominal location  -we discussed possible serious and likely etiologies, workup and treatment, treatment risks and return precautions - likely constipation -after this discussion, Kim Pacheco opted for treatment per patient instructions and she will wanted to check a urine dip and will add a urine pregnancy -follow up advised as needed -of course, we advised Kim Pacheco  to return or notify a doctor immediately if symptoms worsen or persist or new concerns arise.    Patient Instructions  Use MiraLAX daily before breakfast in water. You can take it tonight when you get home. Continue the MiraLAX until you have had several days of soft stool in  you're feeling better.  Antibiotic may also be helpful.  If you have any acid reflux symptoms you can use over-the-counter Nexium for a few days.  Follow-up if symptoms worsen, new symptoms develop or you do not  start to feel much better over the next few days.   Kim BasqueKIM, [name], DO

## 2016-05-31 NOTE — Addendum Note (Signed)
Addended by: Johnella MoloneyFUNDERBURK, JO A on: 05/31/2016 05:11 PM   Modules accepted: Orders

## 2016-05-31 NOTE — Addendum Note (Signed)
Addended by: Johnella MoloneyFUNDERBURK, JO A on: 05/31/2016 05:13 PM   Modules accepted: Orders

## 2016-05-31 NOTE — Progress Notes (Signed)
Pre visit review using our clinic review tool, if applicable. No additional management support is needed unless otherwise documented below in the visit note. 

## 2016-05-31 NOTE — Patient Instructions (Signed)
Use MiraLAX daily before breakfast in water. You can take it tonight when you get home. Continue the MiraLAX until you have had several days of soft stool in you're feeling better.  Antibiotic may also be helpful.  If you have any acid reflux symptoms you can use over-the-counter Nexium for a few days.  Follow-up if symptoms worsen, new symptoms develop or you do not  start to feel much better over the next few days.

## 2016-06-02 LAB — URINE CULTURE: Organism ID, Bacteria: NO GROWTH

## 2016-07-16 ENCOUNTER — Encounter: Payer: Self-pay | Admitting: Family Medicine

## 2016-07-16 ENCOUNTER — Ambulatory Visit (INDEPENDENT_AMBULATORY_CARE_PROVIDER_SITE_OTHER): Payer: BLUE CROSS/BLUE SHIELD | Admitting: Family Medicine

## 2016-07-16 VITALS — BP 98/72 | HR 77 | Temp 98.3°F | Ht 59.75 in | Wt 122.9 lb

## 2016-07-16 DIAGNOSIS — R040 Epistaxis: Secondary | ICD-10-CM

## 2016-07-16 DIAGNOSIS — J329 Chronic sinusitis, unspecified: Secondary | ICD-10-CM

## 2016-07-16 DIAGNOSIS — J310 Chronic rhinitis: Secondary | ICD-10-CM

## 2016-07-16 MED ORDER — AMOXICILLIN-POT CLAVULANATE 875-125 MG PO TABS: 1.0000 | ORAL_TABLET | Freq: Two times a day (BID) | ORAL | 0 refills | Status: DC

## 2016-07-16 NOTE — Progress Notes (Signed)
HPI:  URI -started: 1-2 weeks ago -symptoms:nasal congestion, sore throat, cough - no with nasal congestion and occ streaks of blood in mucus from nose, pain in nose -denies:fever, SOB, NVD, tooth pain, body aches -has tried: flonase -sick contacts/travel/risks: no reported flu, strep or tick exposure -Hx of: allergies ROS: See pertinent positives and negatives per HPI.  Past Medical History:  Diagnosis Date  . Abnormal Pap smear    normal 06/2013  . Depression    suicide attempt in college  . Diabetes mellitus without complication (HCC)    gestational, no issues since pregnancy per her report  . Hepatitis    Hep B, managed by Dr. Kinnie ScalesMedoff  . History of chicken pox   . History of UTI     Past Surgical History:  Procedure Laterality Date  . CESAREAN SECTION N/A 01/26/2013   Procedure: Primary Cesarean Section Delivery Baby  Boy @ 0017, Apgars 4/8/9;  Surgeon: Freddrick MarchKendra H. Tenny Crawoss, MD;  Location: [facility];  Service: Obstetrics;  Laterality: N/A;  . COLPOSCOPY    . NASAL SINUS SURGERY      Family History  Problem Relation Age of Onset  . Adopted: Yes    Social History   Social History  . Marital status: Married    Spouse name: N/A  . Number of children: 1  . Years of education: college   Occupational History  . photographer    Social History Main Topics  . Smoking status: Former Smoker    Packs/day: 1.00    Types: Cigarettes  . Smokeless tobacco: Never Used     Comment: smoked for 8 years, quit in 2013  . Alcohol use No  . Drug use: No  . Sexual activity: Yes    Birth control/ protection: Pill   Other Topics Concern  . 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  fluticasone (FLONASE) 50 MCG/ACT nasal spray, Place 2 sprays  into both nostrils daily., Disp: 16 g, Rfl: 6 .  Tenofovir Alafenamide Fumarate (VEMLIDY PO), Take by mouth., Disp: , Rfl:  .  amoxicillin-clavulanate (AUGMENTIN) 875-125 MG tablet, Take 1 tablet by mouth 2 (two) times daily., Disp: 20 tablet, Rfl: 0  EXAM:  Vitals:   07/16/16 0919  BP: 98/72  Pulse: 77  Temp: 98.3 F (36.8 C)    Body mass index is 24.2 kg/m.  GENERAL: vitals reviewed and listed above, alert, oriented, appears well hydrated and in no acute distress  HEENT: atraumatic, conjunttiva clear, no obvious abnormalities on inspection of external nose and ears, normal appearance of ear canals and TMs, R>L thick nasal congestion, mild post oropharyngeal erythema with PND, no tonsillar edema or exudate, no sinus TTP  NECK: no obvious masses on inspection  LUNGS: clear to auscultation bilaterally, no wheezes, rales or rhonchi, good air movement  CV: HRRR, no peripheral edema  MS: moves all extremities without noticeable abnormality  PSYCH: pleasant and cooperative, no obvious depression or anxiety  ASSESSMENT AND PLAN:  Discussed the following assessment and plan:  Rhinosinusitis  Bleeding from the nose   We discussed potential etiologies, with VURI, dry nasal membranes and potentially developing mild sinusitis being most likely,  and advised supportive care and monitoring. We discussed treatment side effects, likely course, antibiotic misuse, transmission, and signs of developing a serious illness. Opted to hold flonase for 5-7 days, light nasal saline and humidifier, abx if not improving. -of course, we advised to return or notify a doctor immediately if symptoms worsen or persist or new concerns arise.    Patient Instructions  Nasal saline twice daily  Humidifier at night  No flonase for 5-7 days  Antibiotic if worsening or not improving over the next 3-4 days  Follow up if worsening, new concerns or not improving with treatment  I hope you feel better  soon   [name]., DO

## 2016-07-16 NOTE — Progress Notes (Signed)
Pre visit review using our clinic review tool, if applicable. No additional management support is needed unless otherwise documented below in the visit note. 

## 2016-07-16 NOTE — Patient Instructions (Addendum)
Nasal saline twice daily  Humidifier at night  No flonase for 5-7 days  Antibiotic if worsening or not improving over the next 3-4 days  Follow up if worsening, new concerns or not improving with treatment  I hope you feel better soon

## 2016-08-02 DIAGNOSIS — B181 Chronic viral hepatitis B without delta-agent: Secondary | ICD-10-CM | POA: Diagnosis not present

## 2016-10-09 DIAGNOSIS — H5702 Anisocoria: Secondary | ICD-10-CM | POA: Diagnosis not present

## 2016-11-27 ENCOUNTER — Encounter: Payer: Self-pay | Admitting: Family Medicine

## 2016-11-27 ENCOUNTER — Ambulatory Visit (INDEPENDENT_AMBULATORY_CARE_PROVIDER_SITE_OTHER): Payer: BLUE CROSS/BLUE SHIELD | Admitting: Family Medicine

## 2016-11-27 VITALS — BP 110/80 | HR 62 | Temp 98.4°F | Wt 120.7 lb

## 2016-11-27 DIAGNOSIS — S50861A Insect bite (nonvenomous) of right forearm, initial encounter: Secondary | ICD-10-CM | POA: Diagnosis not present

## 2016-11-27 DIAGNOSIS — W57XXXA Bitten or stung by nonvenomous insect and other nonvenomous arthropods, initial encounter: Secondary | ICD-10-CM

## 2016-11-27 MED ORDER — TRIAMCINOLONE ACETONIDE 0.1 % EX CREA: 1.0000 "application " | TOPICAL_CREAM | Freq: Two times a day (BID) | CUTANEOUS | 0 refills | Status: DC

## 2016-11-27 NOTE — Patient Instructions (Signed)

## 2016-11-27 NOTE — Progress Notes (Signed)
Subjective:     Patient ID: Juluis MireMaria K Sollecito-Olson, female   DOB: 23-Jan-1986, 31 y.o.   MRN: [id number]  HPI Patient seen following tick bite to her right arm and right lateral hip area about a month ago. She describes what sounds like deer tick. She thinks she remove the entire tick. She's had some persistent pruritus and swelling especially of the right arm bite. No fever. No headaches. No generalized arthralgias. No other rash.  Past Medical History:  Diagnosis Date  . Abnormal Pap smear    normal 06/2013  . Depression    suicide attempt in college  . Diabetes mellitus without complication (HCC)    gestational, no issues since pregnancy per her report  . Hepatitis    Hep B, managed by Dr. Kinnie ScalesMedoff  . History of chicken pox   . History of UTI    Past Surgical History:  Procedure Laterality Date  . CESAREAN SECTION N/A 01/26/2013   Procedure: Primary Cesarean Section Delivery Baby  Boy @ 0017, Apgars 4/8/9;  Surgeon: Freddrick MarchKendra H. Tenny Crawoss, MD;  Location: [facility];  Service: Obstetrics;  Laterality: N/A;  . COLPOSCOPY    . NASAL SINUS SURGERY      reports that she has quit smoking. Her smoking use included Cigarettes. She smoked 1.00 pack per day. She has never used smokeless tobacco. She reports that she does not drink alcohol or use drugs. family history is not on file. She was adopted. No Known Allergies   Review of Systems  Constitutional: Negative for chills and fever.  Musculoskeletal: Negative for arthralgias.  Neurological: Negative for headaches.       Objective:   Physical Exam  Constitutional: She appears well-developed and well-nourished.  Cardiovascular: Normal rate and regular rhythm.   Skin:  Right elbow medially she has on her proximal forearm area of mild swelling which is approximately 5 mm in length and minimally erythematous. Nontender. No pustules. No obvious foreign body seen or palpated       Assessment:     Recent tick bite with likely local allergic  reaction. Cannot rule out retained tick part    Plan:     -Recommend trial of triamcinolone 0.1% cream twice daily -We explained that symptoms of local pruritus can persist for several months. -If not improving over the next couple months follow-up with primary -Review signs and symptoms of tick related illness to watch out for  Kristian CoveyBruce W Infinity Jeffords MD [facility]

## 2016-12-23 ENCOUNTER — Ambulatory Visit (INDEPENDENT_AMBULATORY_CARE_PROVIDER_SITE_OTHER): Payer: BLUE CROSS/BLUE SHIELD | Admitting: Family Medicine

## 2016-12-23 ENCOUNTER — Encounter: Payer: Self-pay | Admitting: Family Medicine

## 2016-12-23 VITALS — BP 112/60 | HR 73 | Temp 98.3°F | Ht 59.75 in | Wt 120.3 lb

## 2016-12-23 DIAGNOSIS — L905 Scar conditions and fibrosis of skin: Secondary | ICD-10-CM | POA: Diagnosis not present

## 2016-12-23 DIAGNOSIS — L989 Disorder of the skin and subcutaneous tissue, unspecified: Secondary | ICD-10-CM | POA: Diagnosis not present

## 2016-12-23 NOTE — Progress Notes (Signed)
  HPI:  Acute visit for skin lesion: -bit by tick a few months ago -developed red itchy spot at bite site -saw my colleague and steroid cream was provided - she has been applying this -still has small dark lesion at site of bite -no fevers, malaise, drainage, pain, discomfort, rash elsewhere, myalgias or other symtpoms  ROS: See pertinent positives and negatives per HPI.  Past Medical History:  Diagnosis Date  . Abnormal Pap smear    normal 06/2013  . Depression    suicide attempt in college  . Diabetes mellitus without complication (HCC)    gestational, no issues since pregnancy per her report  . Hepatitis    Hep B, managed by Dr. Kinnie ScalesMedoff  . History of chicken pox   . History of UTI     Past Surgical History:  Procedure Laterality Date  . CESAREAN SECTION N/A 01/26/2013   Procedure: Primary Cesarean Section Delivery Baby  Boy @ 0017, Apgars 4/8/9;  Surgeon: Freddrick MarchKendra H. Tenny Crawoss, MD;  Location: [facility];  Service: Obstetrics;  Laterality: N/A;  . COLPOSCOPY    . NASAL SINUS SURGERY      Family History  Problem Relation Age of Onset  . Adopted: Yes    Social History   Social History  . Marital status: Married    Spouse name: N/A  . Number of children: 1  . Years of education: college   Occupational History  . photographer    Social History Main Topics  . Smoking status: Former Smoker    Packs/day: 1.00    Types: Cigarettes  . Smokeless tobacco: Never Used     Comment: smoked for 8 years, quit in 2013  . Alcohol use No  . Drug use: No  . Sexual activity: Yes    Birth control/ protection: Pill   Other Topics Concern  . 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  fluticasone (FLONASE) 50 MCG/ACT nasal spray, Place 2 sprays  into both nostrils daily., Disp: 16 g, Rfl: 6 .  triamcinolone cream (KENALOG) 0.1 %, Apply 1 application topically 2 (two) times daily., Disp: 30 g, Rfl: 0  EXAM:  Vitals:   12/23/16 0930  BP: 112/60  Pulse: 73  Temp: 98.3 F (36.8 C)    Body mass index is 23.69 kg/m.  GENERAL: vitals reviewed and listed above, alert, oriented, appears well hydrated and in no acute distress  SKIN: small hyperpig area of thickened skin R antecubital region, no redness, drainage, fluctuance, rash  MS: moves all extremities without noticeable abnormality  PSYCH: pleasant and cooperative, no obvious depression or anxiety  ASSESSMENT AND PLAN:  Discussed the following assessment and plan:  Skin lesion  Scar  -no sign or symptoms of tick borne illness or infection -appears to have formed scar at this area Advised: -stop steroid, can use Aquaphor or pet jelly to soften -derm eval if desires removal, any symptoms or worsening or not fading over time -Patient advised to return or notify a doctor immediately if symptoms worsen or persist or new concerns arise.  There are no Patient Instructions on file for this visit.  [name], Jasyah Theurer R., DO

## 2017-02-25 DIAGNOSIS — Z01419 Encounter for gynecological examination (general) (routine) without abnormal findings: Secondary | ICD-10-CM | POA: Diagnosis not present

## 2017-02-25 DIAGNOSIS — Z124 Encounter for screening for malignant neoplasm of cervix: Secondary | ICD-10-CM | POA: Diagnosis not present

## 2017-02-25 DIAGNOSIS — R87610 Atypical squamous cells of undetermined significance on cytologic smear of cervix (ASC-US): Secondary | ICD-10-CM | POA: Diagnosis not present

## 2017-02-25 DIAGNOSIS — Z6823 Body mass index (BMI) 23.0-23.9, adult: Secondary | ICD-10-CM | POA: Diagnosis not present

## 2017-02-25 LAB — HM PAP SMEAR

## 2017-05-08 DIAGNOSIS — B181 Chronic viral hepatitis B without delta-agent: Secondary | ICD-10-CM | POA: Diagnosis not present

## 2017-05-08 DIAGNOSIS — Z113 Encounter for screening for infections with a predominantly sexual mode of transmission: Secondary | ICD-10-CM | POA: Diagnosis not present

## 2017-05-08 DIAGNOSIS — Z98891 History of uterine scar from previous surgery: Secondary | ICD-10-CM

## 2017-05-08 DIAGNOSIS — Z6824 Body mass index (BMI) 24.0-24.9, adult: Secondary | ICD-10-CM | POA: Diagnosis not present

## 2017-05-08 DIAGNOSIS — Z3490 Encounter for supervision of normal pregnancy, unspecified, unspecified trimester: Secondary | ICD-10-CM | POA: Diagnosis not present

## 2017-05-08 DIAGNOSIS — Z3201 Encounter for pregnancy test, result positive: Secondary | ICD-10-CM | POA: Diagnosis not present

## 2017-05-08 DIAGNOSIS — N925 Other specified irregular menstruation: Secondary | ICD-10-CM | POA: Diagnosis not present

## 2017-05-08 HISTORY — DX: History of uterine scar from previous surgery: Z98.891

## 2017-05-12 ENCOUNTER — Encounter: Payer: Self-pay | Admitting: Family Medicine

## 2017-05-12 DIAGNOSIS — Z3490 Encounter for supervision of normal pregnancy, unspecified, unspecified trimester: Secondary | ICD-10-CM

## 2017-05-12 HISTORY — DX: Encounter for supervision of normal pregnancy, unspecified, unspecified trimester: Z34.90

## 2017-05-16 ENCOUNTER — Telehealth: Payer: Self-pay | Admitting: Family Medicine

## 2017-05-16 NOTE — Telephone Encounter (Signed)
Copied from CRM [phone]. Topic: Quick Communication - See Telephone Encounter >> May 16, 2017  3:15 PM Arlyss Gandyichardson, Taren N, NT wrote: CRM for notification. See Telephone encounter for: Kim Pacheco from Dr. Aurora MaskMedoffs calling to speak with either Dr. [name] or her nurse regarding findings for this pt. Dr. Kinnie ScalesMedoff will like to dismiss this pt from his practice because she was telling them that she was taking her medicine up until mid-October but labs were elevated and Dr. Kinnie ScalesMedoff found out she was not in the [facility] since July 2014. Dr. Kinnie ScalesMedoff recommends she find another hepatologist to avoid transmitting her hepatitis to her baby. She is [redacted] weeks pregnant. CB#: [phone] ask for [facility]  05/16/17.

## 2017-05-21 NOTE — Telephone Encounter (Signed)
Appreciate heads up. I assume he is referring her to another hepatologist if he is dismissing and treating in the interim? Also, that he notified pt of his concerns and that he notified pt's obstetrician of the situation? We do not treat this condition nor manage pt's pregnancy.

## 2017-05-22 NOTE — Telephone Encounter (Signed)
I called Dr Jennye BoroughsMedoff's office and informed Tiffany of the message below and she stated [facility]  [facility] called back and stated she received the message and their office would not refer the pt to another office and asked for her OB/GYN information.  I advised she contact the pt for this information.

## 2017-06-10 HISTORY — DX: Maternal care for unspecified type scar from previous cesarean delivery: O34.219

## 2017-06-11 DIAGNOSIS — Z3490 Encounter for supervision of normal pregnancy, unspecified, unspecified trimester: Secondary | ICD-10-CM | POA: Diagnosis not present

## 2017-06-11 DIAGNOSIS — Z363 Encounter for antenatal screening for malformations: Secondary | ICD-10-CM | POA: Diagnosis not present

## 2017-06-11 DIAGNOSIS — Z3480 Encounter for supervision of other normal pregnancy, unspecified trimester: Secondary | ICD-10-CM | POA: Diagnosis not present

## 2017-06-13 ENCOUNTER — Telehealth: Payer: Self-pay | Admitting: Family Medicine

## 2017-06-13 DIAGNOSIS — B181 Chronic viral hepatitis B without delta-agent: Secondary | ICD-10-CM

## 2017-06-13 NOTE — Telephone Encounter (Signed)
Ronnald CollumJo Anne, Please obtain dx she is requesting for referral? Thanks.  Copying Dustin for [employer] - this would be a good example of the [facility] not obtaining enough information during a patient call request. If a pt calls in requesting a referral, at minimum the call notes should contain what the requested referral is for. Thanks for adding this to the list to pass on to the call center.

## 2017-06-13 NOTE — Telephone Encounter (Signed)
Copied from CRM [phone]. Topic: Referral - Request >> Jun 13, 2017  3:21 PM Arlyss Gandyichardson, Taren N, NT wrote: Reason for CRM: Pt requesting a referral to gastroenterologist.

## 2017-06-16 NOTE — Telephone Encounter (Signed)
Okay to send referral.  Some of my patients really like Dawn Drezek.  Okay to refer to her choice.

## 2017-06-16 NOTE — Telephone Encounter (Signed)
I called the pt and informed her of the message below.  Patient stated she does not have a preference and only requested a physician in [place].  The referral was entered with this information.

## 2017-06-16 NOTE — Telephone Encounter (Signed)
I called the pt and she stated she needs a referral for Hepatitis B as she and Dr Kinnie ScalesMedoff were not a good fit.  Message sent to Dr [name].

## 2017-06-19 DIAGNOSIS — O99810 Abnormal glucose complicating pregnancy: Secondary | ICD-10-CM | POA: Diagnosis not present

## 2017-07-09 DIAGNOSIS — Z363 Encounter for antenatal screening for malformations: Secondary | ICD-10-CM | POA: Diagnosis not present

## 2017-07-24 DIAGNOSIS — M9902 Segmental and somatic dysfunction of thoracic region: Secondary | ICD-10-CM | POA: Diagnosis not present

## 2017-07-24 DIAGNOSIS — M9901 Segmental and somatic dysfunction of cervical region: Secondary | ICD-10-CM | POA: Diagnosis not present

## 2017-07-24 DIAGNOSIS — M546 Pain in thoracic spine: Secondary | ICD-10-CM | POA: Diagnosis not present

## 2017-07-24 DIAGNOSIS — M542 Cervicalgia: Secondary | ICD-10-CM | POA: Diagnosis not present

## 2017-07-28 DIAGNOSIS — M9901 Segmental and somatic dysfunction of cervical region: Secondary | ICD-10-CM | POA: Diagnosis not present

## 2017-07-28 DIAGNOSIS — M9902 Segmental and somatic dysfunction of thoracic region: Secondary | ICD-10-CM | POA: Diagnosis not present

## 2017-07-28 DIAGNOSIS — M542 Cervicalgia: Secondary | ICD-10-CM | POA: Diagnosis not present

## 2017-07-28 DIAGNOSIS — M546 Pain in thoracic spine: Secondary | ICD-10-CM | POA: Diagnosis not present

## 2017-07-29 DIAGNOSIS — M546 Pain in thoracic spine: Secondary | ICD-10-CM | POA: Diagnosis not present

## 2017-07-29 DIAGNOSIS — M9901 Segmental and somatic dysfunction of cervical region: Secondary | ICD-10-CM | POA: Diagnosis not present

## 2017-07-29 DIAGNOSIS — M542 Cervicalgia: Secondary | ICD-10-CM | POA: Diagnosis not present

## 2017-07-29 DIAGNOSIS — M9902 Segmental and somatic dysfunction of thoracic region: Secondary | ICD-10-CM | POA: Diagnosis not present

## 2017-07-30 DIAGNOSIS — M542 Cervicalgia: Secondary | ICD-10-CM | POA: Diagnosis not present

## 2017-07-30 DIAGNOSIS — Z3491 Encounter for supervision of normal pregnancy, unspecified, first trimester: Secondary | ICD-10-CM | POA: Diagnosis not present

## 2017-07-30 DIAGNOSIS — M9901 Segmental and somatic dysfunction of cervical region: Secondary | ICD-10-CM | POA: Diagnosis not present

## 2017-07-30 DIAGNOSIS — M9902 Segmental and somatic dysfunction of thoracic region: Secondary | ICD-10-CM | POA: Diagnosis not present

## 2017-07-30 DIAGNOSIS — Z3A22 22 weeks gestation of pregnancy: Secondary | ICD-10-CM | POA: Diagnosis not present

## 2017-07-30 DIAGNOSIS — B181 Chronic viral hepatitis B without delta-agent: Secondary | ICD-10-CM | POA: Diagnosis not present

## 2017-07-30 DIAGNOSIS — M546 Pain in thoracic spine: Secondary | ICD-10-CM | POA: Diagnosis not present

## 2017-08-05 DIAGNOSIS — M9902 Segmental and somatic dysfunction of thoracic region: Secondary | ICD-10-CM | POA: Diagnosis not present

## 2017-08-05 DIAGNOSIS — M9901 Segmental and somatic dysfunction of cervical region: Secondary | ICD-10-CM | POA: Diagnosis not present

## 2017-08-05 DIAGNOSIS — M546 Pain in thoracic spine: Secondary | ICD-10-CM | POA: Diagnosis not present

## 2017-08-05 DIAGNOSIS — M542 Cervicalgia: Secondary | ICD-10-CM | POA: Diagnosis not present

## 2017-08-07 DIAGNOSIS — M546 Pain in thoracic spine: Secondary | ICD-10-CM | POA: Diagnosis not present

## 2017-08-07 DIAGNOSIS — M542 Cervicalgia: Secondary | ICD-10-CM | POA: Diagnosis not present

## 2017-08-07 DIAGNOSIS — M9901 Segmental and somatic dysfunction of cervical region: Secondary | ICD-10-CM | POA: Diagnosis not present

## 2017-08-07 DIAGNOSIS — M9902 Segmental and somatic dysfunction of thoracic region: Secondary | ICD-10-CM | POA: Diagnosis not present

## 2017-08-12 DIAGNOSIS — M9902 Segmental and somatic dysfunction of thoracic region: Secondary | ICD-10-CM | POA: Diagnosis not present

## 2017-08-12 DIAGNOSIS — M546 Pain in thoracic spine: Secondary | ICD-10-CM | POA: Diagnosis not present

## 2017-08-12 DIAGNOSIS — M542 Cervicalgia: Secondary | ICD-10-CM | POA: Diagnosis not present

## 2017-08-12 DIAGNOSIS — M9901 Segmental and somatic dysfunction of cervical region: Secondary | ICD-10-CM | POA: Diagnosis not present

## 2017-08-14 DIAGNOSIS — M542 Cervicalgia: Secondary | ICD-10-CM | POA: Diagnosis not present

## 2017-08-14 DIAGNOSIS — M9901 Segmental and somatic dysfunction of cervical region: Secondary | ICD-10-CM | POA: Diagnosis not present

## 2017-08-14 DIAGNOSIS — M9902 Segmental and somatic dysfunction of thoracic region: Secondary | ICD-10-CM | POA: Diagnosis not present

## 2017-08-14 DIAGNOSIS — M546 Pain in thoracic spine: Secondary | ICD-10-CM | POA: Diagnosis not present

## 2017-08-19 DIAGNOSIS — M546 Pain in thoracic spine: Secondary | ICD-10-CM | POA: Diagnosis not present

## 2017-08-19 DIAGNOSIS — M9902 Segmental and somatic dysfunction of thoracic region: Secondary | ICD-10-CM | POA: Diagnosis not present

## 2017-08-19 DIAGNOSIS — M542 Cervicalgia: Secondary | ICD-10-CM | POA: Diagnosis not present

## 2017-08-19 DIAGNOSIS — M9901 Segmental and somatic dysfunction of cervical region: Secondary | ICD-10-CM | POA: Diagnosis not present

## 2017-08-21 DIAGNOSIS — M9902 Segmental and somatic dysfunction of thoracic region: Secondary | ICD-10-CM | POA: Diagnosis not present

## 2017-08-21 DIAGNOSIS — M9901 Segmental and somatic dysfunction of cervical region: Secondary | ICD-10-CM | POA: Diagnosis not present

## 2017-08-21 DIAGNOSIS — M542 Cervicalgia: Secondary | ICD-10-CM | POA: Diagnosis not present

## 2017-08-21 DIAGNOSIS — M546 Pain in thoracic spine: Secondary | ICD-10-CM | POA: Diagnosis not present

## 2017-08-26 DIAGNOSIS — M9901 Segmental and somatic dysfunction of cervical region: Secondary | ICD-10-CM | POA: Diagnosis not present

## 2017-08-26 DIAGNOSIS — M546 Pain in thoracic spine: Secondary | ICD-10-CM | POA: Diagnosis not present

## 2017-08-26 DIAGNOSIS — M542 Cervicalgia: Secondary | ICD-10-CM | POA: Diagnosis not present

## 2017-08-26 DIAGNOSIS — M9902 Segmental and somatic dysfunction of thoracic region: Secondary | ICD-10-CM | POA: Diagnosis not present

## 2017-08-27 DIAGNOSIS — B181 Chronic viral hepatitis B without delta-agent: Secondary | ICD-10-CM | POA: Diagnosis not present

## 2017-08-28 DIAGNOSIS — M546 Pain in thoracic spine: Secondary | ICD-10-CM | POA: Diagnosis not present

## 2017-08-28 DIAGNOSIS — M9902 Segmental and somatic dysfunction of thoracic region: Secondary | ICD-10-CM | POA: Diagnosis not present

## 2017-08-28 DIAGNOSIS — M9901 Segmental and somatic dysfunction of cervical region: Secondary | ICD-10-CM | POA: Diagnosis not present

## 2017-08-28 DIAGNOSIS — M542 Cervicalgia: Secondary | ICD-10-CM | POA: Diagnosis not present

## 2017-09-02 DIAGNOSIS — Z3480 Encounter for supervision of other normal pregnancy, unspecified trimester: Secondary | ICD-10-CM | POA: Diagnosis not present

## 2017-09-02 DIAGNOSIS — M546 Pain in thoracic spine: Secondary | ICD-10-CM | POA: Diagnosis not present

## 2017-09-02 DIAGNOSIS — M542 Cervicalgia: Secondary | ICD-10-CM | POA: Diagnosis not present

## 2017-09-02 DIAGNOSIS — M9902 Segmental and somatic dysfunction of thoracic region: Secondary | ICD-10-CM | POA: Diagnosis not present

## 2017-09-02 DIAGNOSIS — Z23 Encounter for immunization: Secondary | ICD-10-CM | POA: Diagnosis not present

## 2017-09-02 DIAGNOSIS — M9901 Segmental and somatic dysfunction of cervical region: Secondary | ICD-10-CM | POA: Diagnosis not present

## 2017-09-09 DIAGNOSIS — O24419 Gestational diabetes mellitus in pregnancy, unspecified control: Secondary | ICD-10-CM | POA: Insufficient documentation

## 2017-09-09 DIAGNOSIS — O99810 Abnormal glucose complicating pregnancy: Secondary | ICD-10-CM | POA: Diagnosis not present

## 2017-09-09 HISTORY — DX: Gestational diabetes mellitus in pregnancy, unspecified control: O24.419

## 2017-09-17 ENCOUNTER — Encounter: Payer: BLUE CROSS/BLUE SHIELD | Attending: Obstetrics and Gynecology | Admitting: Registered"

## 2017-09-17 ENCOUNTER — Encounter: Payer: Self-pay | Admitting: Registered"

## 2017-09-17 DIAGNOSIS — O99810 Abnormal glucose complicating pregnancy: Secondary | ICD-10-CM

## 2017-09-17 DIAGNOSIS — B181 Chronic viral hepatitis B without delta-agent: Secondary | ICD-10-CM | POA: Diagnosis not present

## 2017-09-17 DIAGNOSIS — Z713 Dietary counseling and surveillance: Secondary | ICD-10-CM | POA: Diagnosis not present

## 2017-09-17 HISTORY — DX: Abnormal glucose complicating pregnancy: O99.810

## 2017-09-17 NOTE — Progress Notes (Signed)
Patient was seen on 09/17/17 for Gestational Diabetes self-management class at [facility]. The following learning objectives were met by the patient during this course:   States the definition of Gestational Diabetes  States why dietary management is important in controlling blood glucose  Describes the effects each nutrient has on blood glucose levels  Demonstrates ability to create a balanced meal plan  Demonstrates carbohydrate counting   States when to check blood glucose levels  Demonstrates proper blood glucose monitoring techniques  States the effect of stress and exercise on blood glucose levels  States the importance of limiting caffeine and abstaining from alcohol and smoking  Blood glucose monitor given: [employer] # [id number] Exp: 11/08/2018 Blood glucose reading: 129  Patient instructed to monitor glucose levels: FBS: 60 - <95; 1 hour: <140; 2 hour: <120  Patient received handouts:  Nutrition Diabetes and Pregnancy, including carb counting list  Patient will be seen for follow-up as needed.

## 2017-10-06 DIAGNOSIS — O368131 Decreased fetal movements, third trimester, fetus 1: Secondary | ICD-10-CM | POA: Diagnosis not present

## 2017-10-10 DIAGNOSIS — O24419 Gestational diabetes mellitus in pregnancy, unspecified control: Secondary | ICD-10-CM | POA: Diagnosis not present

## 2017-10-13 DIAGNOSIS — H5702 Anisocoria: Secondary | ICD-10-CM | POA: Diagnosis not present

## 2017-10-13 LAB — HM DIABETES EYE EXAM

## 2017-10-17 DIAGNOSIS — O24419 Gestational diabetes mellitus in pregnancy, unspecified control: Secondary | ICD-10-CM | POA: Diagnosis not present

## 2017-10-21 DIAGNOSIS — O24419 Gestational diabetes mellitus in pregnancy, unspecified control: Secondary | ICD-10-CM | POA: Diagnosis not present

## 2017-10-22 LAB — OB RESULTS CONSOLE GC/CHLAMYDIA
Chlamydia: NEGATIVE
Gonorrhea: NEGATIVE

## 2017-10-22 LAB — OB RESULTS CONSOLE HEPATITIS B SURFACE ANTIGEN
HEP B S AG: NEGATIVE
Hepatitis B Surface Ag: POSITIVE

## 2017-10-22 LAB — OB RESULTS CONSOLE RPR: RPR: NONREACTIVE

## 2017-10-22 LAB — OB RESULTS CONSOLE ABO/RH: RH TYPE: POSITIVE

## 2017-10-22 LAB — OB RESULTS CONSOLE ANTIBODY SCREEN: Antibody Screen: NEGATIVE

## 2017-10-22 LAB — OB RESULTS CONSOLE HIV ANTIBODY (ROUTINE TESTING): HIV: NONREACTIVE

## 2017-10-22 LAB — OB RESULTS CONSOLE RUBELLA ANTIBODY, IGM: Rubella: IMMUNE

## 2017-10-24 DIAGNOSIS — O24414 Gestational diabetes mellitus in pregnancy, insulin controlled: Secondary | ICD-10-CM | POA: Diagnosis not present

## 2017-10-24 DIAGNOSIS — Z3A34 34 weeks gestation of pregnancy: Secondary | ICD-10-CM | POA: Diagnosis not present

## 2017-10-24 DIAGNOSIS — Z3A36 36 weeks gestation of pregnancy: Secondary | ICD-10-CM | POA: Diagnosis not present

## 2017-10-24 DIAGNOSIS — B181 Chronic viral hepatitis B without delta-agent: Secondary | ICD-10-CM | POA: Diagnosis not present

## 2017-10-24 DIAGNOSIS — Z3483 Encounter for supervision of other normal pregnancy, third trimester: Secondary | ICD-10-CM | POA: Diagnosis not present

## 2017-10-29 DIAGNOSIS — O24414 Gestational diabetes mellitus in pregnancy, insulin controlled: Secondary | ICD-10-CM | POA: Diagnosis not present

## 2017-10-29 DIAGNOSIS — Z3685 Encounter for antenatal screening for Streptococcus B: Secondary | ICD-10-CM | POA: Diagnosis not present

## 2017-10-29 DIAGNOSIS — Z3A35 35 weeks gestation of pregnancy: Secondary | ICD-10-CM | POA: Diagnosis not present

## 2017-10-30 DIAGNOSIS — Z3A35 35 weeks gestation of pregnancy: Secondary | ICD-10-CM | POA: Diagnosis not present

## 2017-11-04 DIAGNOSIS — O24414 Gestational diabetes mellitus in pregnancy, insulin controlled: Secondary | ICD-10-CM | POA: Diagnosis not present

## 2017-11-04 DIAGNOSIS — Z3A36 36 weeks gestation of pregnancy: Secondary | ICD-10-CM | POA: Diagnosis not present

## 2017-11-07 DIAGNOSIS — O24414 Gestational diabetes mellitus in pregnancy, insulin controlled: Secondary | ICD-10-CM | POA: Diagnosis not present

## 2017-11-07 DIAGNOSIS — Z3A36 36 weeks gestation of pregnancy: Secondary | ICD-10-CM | POA: Diagnosis not present

## 2017-11-10 DIAGNOSIS — B181 Chronic viral hepatitis B without delta-agent: Secondary | ICD-10-CM | POA: Diagnosis not present

## 2017-11-11 DIAGNOSIS — O24414 Gestational diabetes mellitus in pregnancy, insulin controlled: Secondary | ICD-10-CM | POA: Diagnosis not present

## 2017-11-11 DIAGNOSIS — Z3A37 37 weeks gestation of pregnancy: Secondary | ICD-10-CM | POA: Diagnosis not present

## 2017-11-12 DIAGNOSIS — F419 Anxiety disorder, unspecified: Secondary | ICD-10-CM | POA: Diagnosis not present

## 2017-11-14 DIAGNOSIS — Z3A37 37 weeks gestation of pregnancy: Secondary | ICD-10-CM | POA: Diagnosis not present

## 2017-11-14 DIAGNOSIS — O24414 Gestational diabetes mellitus in pregnancy, insulin controlled: Secondary | ICD-10-CM | POA: Diagnosis not present

## 2017-11-17 ENCOUNTER — Encounter (HOSPITAL_COMMUNITY): Payer: Self-pay | Admitting: *Deleted

## 2017-11-17 ENCOUNTER — Telehealth (HOSPITAL_COMMUNITY): Payer: Self-pay | Admitting: *Deleted

## 2017-11-17 ENCOUNTER — Other Ambulatory Visit: Payer: Self-pay | Admitting: Obstetrics and Gynecology

## 2017-11-17 DIAGNOSIS — Z3A38 38 weeks gestation of pregnancy: Secondary | ICD-10-CM | POA: Diagnosis not present

## 2017-11-17 DIAGNOSIS — O24414 Gestational diabetes mellitus in pregnancy, insulin controlled: Secondary | ICD-10-CM | POA: Diagnosis not present

## 2017-11-17 LAB — OB RESULTS CONSOLE GBS: GBS: POSITIVE

## 2017-11-17 NOTE — Telephone Encounter (Signed)
Preadmission screen  

## 2017-11-18 ENCOUNTER — Inpatient Hospital Stay (HOSPITAL_COMMUNITY)
Admission: RE | Admit: 2017-11-18 | Discharge: 2017-11-21 | DRG: 787 | Disposition: A | Discharge status: Home / Self Care | Payer: BLUE CROSS/BLUE SHIELD | Attending: Obstetrics and Gynecology | Admitting: Obstetrics and Gynecology

## 2017-11-18 ENCOUNTER — Inpatient Hospital Stay (HOSPITAL_COMMUNITY): Payer: BLUE CROSS/BLUE SHIELD | Admitting: Anesthesiology

## 2017-11-18 ENCOUNTER — Encounter (HOSPITAL_COMMUNITY): Payer: Self-pay

## 2017-11-18 ENCOUNTER — Encounter (HOSPITAL_COMMUNITY)
Admission: RE | Disposition: A | Discharge status: Home / Self Care | Payer: Self-pay | Source: Home / Self Care | Attending: Obstetrics and Gynecology

## 2017-11-18 DIAGNOSIS — D696 Thrombocytopenia, unspecified: Secondary | ICD-10-CM | POA: Diagnosis not present

## 2017-11-18 DIAGNOSIS — D509 Iron deficiency anemia, unspecified: Secondary | ICD-10-CM | POA: Diagnosis not present

## 2017-11-18 DIAGNOSIS — O9902 Anemia complicating childbirth: Secondary | ICD-10-CM | POA: Diagnosis present

## 2017-11-18 DIAGNOSIS — B181 Chronic viral hepatitis B without delta-agent: Secondary | ICD-10-CM | POA: Diagnosis not present

## 2017-11-18 DIAGNOSIS — O9842 Viral hepatitis complicating childbirth: Secondary | ICD-10-CM | POA: Diagnosis present

## 2017-11-18 DIAGNOSIS — Z3490 Encounter for supervision of normal pregnancy, unspecified, unspecified trimester: Secondary | ICD-10-CM

## 2017-11-18 DIAGNOSIS — Z7984 Long term (current) use of oral hypoglycemic drugs: Secondary | ICD-10-CM

## 2017-11-18 DIAGNOSIS — Z3A38 38 weeks gestation of pregnancy: Secondary | ICD-10-CM

## 2017-11-18 DIAGNOSIS — O9912 Other diseases of the blood and blood-forming organs and certain disorders involving the immune mechanism complicating childbirth: Secondary | ICD-10-CM | POA: Diagnosis present

## 2017-11-18 DIAGNOSIS — O34211 Maternal care for low transverse scar from previous cesarean delivery: Secondary | ICD-10-CM | POA: Diagnosis present

## 2017-11-18 DIAGNOSIS — O34219 Maternal care for unspecified type scar from previous cesarean delivery: Secondary | ICD-10-CM

## 2017-11-18 DIAGNOSIS — O24415 Gestational diabetes mellitus in pregnancy, controlled by oral hypoglycemic drugs: Secondary | ICD-10-CM

## 2017-11-18 DIAGNOSIS — F53 Postpartum depression: Secondary | ICD-10-CM | POA: Diagnosis not present

## 2017-11-18 DIAGNOSIS — O24425 Gestational diabetes mellitus in childbirth, controlled by oral hypoglycemic drugs: Secondary | ICD-10-CM | POA: Diagnosis not present

## 2017-11-18 DIAGNOSIS — O24424 Gestational diabetes mellitus in childbirth, insulin controlled: Secondary | ICD-10-CM | POA: Diagnosis not present

## 2017-11-18 DIAGNOSIS — Z87891 Personal history of nicotine dependence: Secondary | ICD-10-CM | POA: Diagnosis not present

## 2017-11-18 DIAGNOSIS — Z3A00 Weeks of gestation of pregnancy not specified: Secondary | ICD-10-CM | POA: Diagnosis not present

## 2017-11-18 HISTORY — DX: Gestational diabetes mellitus in pregnancy, controlled by oral hypoglycemic drugs: O24.415

## 2017-11-18 LAB — GLUCOSE, CAPILLARY
GLUCOSE-CAPILLARY: 90 mg/dL (ref 65–99)
GLUCOSE-CAPILLARY: 106 mg/dL — AB (ref 65–99)
Glucose-Capillary: 107 mg/dL — ABNORMAL HIGH (ref 65–99)
Glucose-Capillary: 120 mg/dL — ABNORMAL HIGH (ref 65–99)
Glucose-Capillary: 94 mg/dL (ref 65–99)

## 2017-11-18 LAB — CBC
HCT: 30.9 % — ABNORMAL LOW (ref 36.0–46.0)
HEMATOCRIT: 35 % — AB (ref 36.0–46.0)
Hemoglobin: 10.3 g/dL — ABNORMAL LOW (ref 12.0–15.0)
Hemoglobin: 11.7 g/dL — ABNORMAL LOW (ref 12.0–15.0)
MCH: 29.6 pg (ref 26.0–34.0)
MCH: 29.8 pg (ref 26.0–34.0)
MCHC: 33.3 g/dL (ref 30.0–36.0)
MCHC: 33.4 g/dL (ref 30.0–36.0)
MCV: 89.1 fL (ref 78.0–100.0)
MCV: 88.8 fL (ref 78.0–100.0)
PLATELETS: 125 10*3/uL — AB (ref 150–400)
Platelets: 148 10*3/uL — ABNORMAL LOW (ref 150–400)
RBC: 3.93 MIL/uL (ref 3.87–5.11)
RBC: 3.48 MIL/uL — AB (ref 3.87–5.11)
RDW: 13.8 % (ref 11.5–15.5)
RDW: 13.8 % (ref 11.5–15.5)
WBC: 7.9 10*3/uL (ref 4.0–10.5)
WBC: 12.5 10*3/uL — AB (ref 4.0–10.5)

## 2017-11-18 LAB — TYPE AND SCREEN
ABO/RH(D): A POS
Antibody Screen: NEGATIVE

## 2017-11-18 SURGERY — Surgical Case
Anesthesia: Epidural

## 2017-11-18 MED ORDER — OXYTOCIN 10 UNIT/ML IJ SOLN
INTRAVENOUS | Status: DC | PRN
  Administered 2017-11-18: 40 [IU] via INTRAVENOUS

## 2017-11-18 MED ORDER — ONDANSETRON HCL 4 MG/2ML IJ SOLN
INTRAMUSCULAR | Status: AC
  Filled 2017-11-18: qty 2

## 2017-11-18 MED ORDER — KETOROLAC TROMETHAMINE 30 MG/ML IJ SOLN: 30.0000 mg | Freq: Four times a day (QID) | INTRAMUSCULAR | Status: DC | PRN

## 2017-11-18 MED ORDER — FENTANYL CITRATE (PF) 100 MCG/2ML IJ SOLN
INTRAMUSCULAR | Status: DC | PRN
  Administered 2017-11-18: 50 ug via EPIDURAL
  Administered 2017-11-18: 50 ug via INTRAVENOUS

## 2017-11-18 MED ORDER — CEFAZOLIN SODIUM-DEXTROSE 2-4 GM/100ML-% IV SOLN
INTRAVENOUS | Status: AC
  Filled 2017-11-18: qty 100

## 2017-11-18 MED ORDER — TRANEXAMIC ACID 1000 MG/10ML IV SOLN
1000.0000 mg | Freq: Once | INTRAVENOUS | Status: AC
  Administered 2017-11-18: 1000 mg via INTRAVENOUS
  Filled 2017-11-18: qty 1100

## 2017-11-18 MED ORDER — SUCCINYLCHOLINE CHLORIDE 20 MG/ML IJ SOLN
INTRAMUSCULAR | Status: DC | PRN
  Administered 2017-11-18: 120 mg via INTRAVENOUS

## 2017-11-18 MED ORDER — ONDANSETRON HCL 4 MG/2ML IJ SOLN
INTRAMUSCULAR | Status: DC | PRN
  Administered 2017-11-18: 4 mg via INTRAVENOUS

## 2017-11-18 MED ORDER — NALOXONE HCL 0.4 MG/ML IJ SOLN: 0.4000 mg | INTRAMUSCULAR | Status: DC | PRN

## 2017-11-18 MED ORDER — PHENYLEPHRINE 40 MCG/ML (10ML) SYRINGE FOR IV PUSH (FOR BLOOD PRESSURE SUPPORT)
80.0000 ug | PREFILLED_SYRINGE | INTRAVENOUS | Status: DC | PRN
  Filled 2017-11-18: qty 10

## 2017-11-18 MED ORDER — SODIUM CHLORIDE 0.9 % IJ SOLN
INTRAMUSCULAR | Status: AC
  Filled 2017-11-18: qty 20

## 2017-11-18 MED ORDER — FENTANYL CITRATE (PF) 100 MCG/2ML IJ SOLN: 25.0000 ug | INTRAMUSCULAR | Status: DC | PRN

## 2017-11-18 MED ORDER — SOD CITRATE-CITRIC ACID 500-334 MG/5ML PO SOLN
30.0000 mL | ORAL | Status: DC | PRN
  Administered 2017-11-18: 30 mL via ORAL
  Filled 2017-11-18: qty 15

## 2017-11-18 MED ORDER — CEFAZOLIN SODIUM-DEXTROSE 2-3 GM-%(50ML) IV SOLR
INTRAVENOUS | Status: DC | PRN
  Administered 2017-11-18: 2 g via INTRAVENOUS

## 2017-11-18 MED ORDER — DEXAMETHASONE SODIUM PHOSPHATE 10 MG/ML IJ SOLN
INTRAMUSCULAR | Status: DC | PRN
  Administered 2017-11-18: 10 mg via INTRAVENOUS

## 2017-11-18 MED ORDER — LIDOCAINE-EPINEPHRINE (PF) 2 %-1:200000 IJ SOLN
INTRAMUSCULAR | Status: DC | PRN
  Administered 2017-11-18 (×2): 5 mL via EPIDURAL
  Administered 2017-11-18: 4 mL via EPIDURAL
  Administered 2017-11-18: 3 mL via EPIDURAL

## 2017-11-18 MED ORDER — MORPHINE SULFATE (PF) 0.5 MG/ML IJ SOLN
INTRAMUSCULAR | Status: AC
  Filled 2017-11-18: qty 10

## 2017-11-18 MED ORDER — EPHEDRINE 5 MG/ML INJ: 10.0000 mg | INTRAVENOUS | Status: DC | PRN

## 2017-11-18 MED ORDER — DEXAMETHASONE SODIUM PHOSPHATE 10 MG/ML IJ SOLN
INTRAMUSCULAR | Status: AC
  Filled 2017-11-18: qty 1

## 2017-11-18 MED ORDER — OXYCODONE HCL 5 MG PO TABS: 5.0000 mg | ORAL_TABLET | Freq: Once | ORAL | Status: DC | PRN

## 2017-11-18 MED ORDER — LIDOCAINE HCL (PF) 1 % IJ SOLN
INTRAMUSCULAR | Status: DC | PRN
  Administered 2017-11-18 (×2): 4 mL via EPIDURAL

## 2017-11-18 MED ORDER — SUCCINYLCHOLINE CHLORIDE 200 MG/10ML IV SOSY
PREFILLED_SYRINGE | INTRAVENOUS | Status: AC
  Filled 2017-11-18: qty 10

## 2017-11-18 MED ORDER — LACTATED RINGERS IV SOLN
INTRAVENOUS | Status: DC | PRN
  Administered 2017-11-18: 22:00:00 via INTRAVENOUS

## 2017-11-18 MED ORDER — BUPIVACAINE HCL (PF) 0.25 % IJ SOLN
INTRAMUSCULAR | Status: AC
  Filled 2017-11-18: qty 20

## 2017-11-18 MED ORDER — MEPERIDINE HCL 25 MG/ML IJ SOLN: 6.2500 mg | INTRAMUSCULAR | Status: DC | PRN

## 2017-11-18 MED ORDER — PENICILLIN G POT IN DEXTROSE 60000 UNIT/ML IV SOLN
3.0000 10*6.[IU] | INTRAVENOUS | Status: DC
  Administered 2017-11-18 (×3): 3 10*6.[IU] via INTRAVENOUS
  Filled 2017-11-18 (×4): qty 50

## 2017-11-18 MED ORDER — PROPOFOL 10 MG/ML IV BOLUS
INTRAVENOUS | Status: AC
  Filled 2017-11-18: qty 20

## 2017-11-18 MED ORDER — ONDANSETRON HCL 4 MG/2ML IJ SOLN
4.0000 mg | Freq: Three times a day (TID) | INTRAMUSCULAR | Status: DC | PRN
  Administered 2017-11-18: 4 mg via INTRAVENOUS

## 2017-11-18 MED ORDER — SODIUM CHLORIDE 0.9 % IV SOLN
5.0000 10*6.[IU] | Freq: Once | INTRAVENOUS | Status: AC
  Administered 2017-11-18: 5 10*6.[IU] via INTRAVENOUS
  Filled 2017-11-18: qty 5

## 2017-11-18 MED ORDER — SODIUM CHLORIDE 0.9% FLUSH: 3.0000 mL | INTRAVENOUS | Status: DC | PRN

## 2017-11-18 MED ORDER — SCOPOLAMINE 1 MG/3DAYS TD PT72
MEDICATED_PATCH | TRANSDERMAL | Status: DC | PRN
  Administered 2017-11-18: 1 via TRANSDERMAL

## 2017-11-18 MED ORDER — TENOFOVIR DISOPROXIL FUMARATE 300 MG PO TABS
300.0000 mg | ORAL_TABLET | Freq: Every day | ORAL | Status: DC
  Administered 2017-11-18: 300 mg via ORAL
  Filled 2017-11-18 (×2): qty 1

## 2017-11-18 MED ORDER — PROPOFOL 10 MG/ML IV BOLUS
INTRAVENOUS | Status: DC | PRN
  Administered 2017-11-18: 200 mg via INTRAVENOUS

## 2017-11-18 MED ORDER — PHENYLEPHRINE 40 MCG/ML (10ML) SYRINGE FOR IV PUSH (FOR BLOOD PRESSURE SUPPORT): 80.0000 ug | PREFILLED_SYRINGE | INTRAVENOUS | Status: DC | PRN

## 2017-11-18 MED ORDER — FENTANYL CITRATE (PF) 100 MCG/2ML IJ SOLN: 100.0000 ug | INTRAMUSCULAR | Status: DC | PRN

## 2017-11-18 MED ORDER — LACTATED RINGERS IV SOLN
INTRAVENOUS | Status: DC
  Administered 2017-11-18 (×2): via INTRAVENOUS

## 2017-11-18 MED ORDER — FENTANYL 2.5 MCG/ML BUPIVACAINE 1/10 % EPIDURAL INFUSION (WH - ANES)
14.0000 mL/h | INTRAMUSCULAR | Status: DC | PRN
  Administered 2017-11-18: 14 mL/h via EPIDURAL
  Filled 2017-11-18: qty 100

## 2017-11-18 MED ORDER — LACTATED RINGERS IV SOLN
500.0000 mL | Freq: Once | INTRAVENOUS | Status: AC
  Administered 2017-11-18: 500 mL via INTRAVENOUS

## 2017-11-18 MED ORDER — DIPHENHYDRAMINE HCL 50 MG/ML IJ SOLN: 12.5000 mg | INTRAMUSCULAR | Status: DC | PRN

## 2017-11-18 MED ORDER — ACETAMINOPHEN 325 MG PO TABS
650.0000 mg | ORAL_TABLET | ORAL | Status: DC | PRN
  Administered 2017-11-18: 650 mg via ORAL
  Filled 2017-11-18: qty 2

## 2017-11-18 MED ORDER — OXYTOCIN 10 UNIT/ML IJ SOLN
INTRAMUSCULAR | Status: AC
  Filled 2017-11-18: qty 4

## 2017-11-18 MED ORDER — PROMETHAZINE HCL 25 MG/ML IJ SOLN: 6.2500 mg | INTRAMUSCULAR | Status: DC | PRN

## 2017-11-18 MED ORDER — OXYCODONE HCL 5 MG/5ML PO SOLN: 5.0000 mg | Freq: Once | ORAL | Status: DC | PRN

## 2017-11-18 MED ORDER — LACTATED RINGERS IV SOLN
500.0000 mL | INTRAVENOUS | Status: DC | PRN
  Administered 2017-11-18: 500 mL via INTRAVENOUS

## 2017-11-18 MED ORDER — CARBOPROST TROMETHAMINE 250 MCG/ML IM SOLN
INTRAMUSCULAR | Status: AC
  Filled 2017-11-18: qty 1

## 2017-11-18 MED ORDER — OXYTOCIN 40 UNITS IN LACTATED RINGERS INFUSION - SIMPLE MED
1.0000 m[IU]/min | INTRAVENOUS | Status: DC
  Administered 2017-11-18: 2 m[IU]/min via INTRAVENOUS
  Filled 2017-11-18: qty 1000

## 2017-11-18 MED ORDER — FENTANYL 2.5 MCG/ML BUPIVACAINE 1/10 % EPIDURAL INFUSION (WH - ANES): 14.0000 mL/h | INTRAMUSCULAR | Status: DC | PRN

## 2017-11-18 MED ORDER — MORPHINE SULFATE (PF) 0.5 MG/ML IJ SOLN
INTRAMUSCULAR | Status: DC | PRN
  Administered 2017-11-18: 3 mg via EPIDURAL

## 2017-11-18 MED ORDER — SCOPOLAMINE 1 MG/3DAYS TD PT72
MEDICATED_PATCH | TRANSDERMAL | Status: AC
  Filled 2017-11-18: qty 1

## 2017-11-18 MED ORDER — FENTANYL CITRATE (PF) 100 MCG/2ML IJ SOLN
INTRAMUSCULAR | Status: AC
  Filled 2017-11-18: qty 2

## 2017-11-18 MED ORDER — BUPIVACAINE HCL (PF) 0.25 % IJ SOLN
INTRAMUSCULAR | Status: DC | PRN
  Administered 2017-11-18 (×2): 10 mL

## 2017-11-18 MED ORDER — TERBUTALINE SULFATE 1 MG/ML IJ SOLN: 0.2500 mg | Freq: Once | INTRAMUSCULAR | Status: DC | PRN

## 2017-11-18 MED ORDER — PHENYLEPHRINE HCL 10 MG/ML IJ SOLN
INTRAMUSCULAR | Status: DC | PRN
  Administered 2017-11-18 (×3): 80 ug via INTRAVENOUS

## 2017-11-18 MED ORDER — ONDANSETRON HCL 4 MG/2ML IJ SOLN: 4.0000 mg | Freq: Four times a day (QID) | INTRAMUSCULAR | Status: DC | PRN

## 2017-11-18 SURGICAL SUPPLY — 33 items
CHLORAPREP W/TINT 26ML (MISCELLANEOUS) ×2 IMPLANT
CLAMP CORD UMBIL (MISCELLANEOUS) IMPLANT
CLOTH BEACON ORANGE TIMEOUT ST (SAFETY) ×2 IMPLANT
DERMABOND ADVANCED (GAUZE/BANDAGES/DRESSINGS) ×1
DERMABOND ADVANCED .7 DNX12 (GAUZE/BANDAGES/DRESSINGS) ×1 IMPLANT
DRSG OPSITE POSTOP 4X10 (GAUZE/BANDAGES/DRESSINGS) ×2 IMPLANT
ELECT REM PT RETURN 9FT ADLT (ELECTROSURGICAL) ×2
ELECTRODE REM PT RTRN 9FT ADLT (ELECTROSURGICAL) ×1 IMPLANT
EXTRACTOR VACUUM M CUP 4 TUBE (SUCTIONS) IMPLANT
GLOVE BIO SURGEON STRL SZ7.5 (GLOVE) ×2 IMPLANT
GLOVE BIOGEL PI IND STRL 7.0 (GLOVE) ×1 IMPLANT
GLOVE BIOGEL PI INDICATOR 7.0 (GLOVE) ×1
GOWN STRL REUS W/TWL LRG LVL3 (GOWN DISPOSABLE) ×4 IMPLANT
KIT ABG SYR 3ML LUER SLIP (SYRINGE) IMPLANT
NEEDLE HYPO 22GX1.5 SAFETY (NEEDLE) ×2 IMPLANT
NEEDLE HYPO 25X5/8 SAFETYGLIDE (NEEDLE) IMPLANT
NEEDLE SPNL 20GX3.5 QUINCKE YW (NEEDLE) IMPLANT
NS IRRIG 1000ML POUR BTL (IV SOLUTION) ×2 IMPLANT
PACK C SECTION WH (CUSTOM PROCEDURE TRAY) ×2 IMPLANT
PENCIL SMOKE EVAC W/HOLSTER (ELECTROSURGICAL) ×2 IMPLANT
SUT MNCRL 0 VIOLET CTX 36 (SUTURE) ×2 IMPLANT
SUT MNCRL AB 3-0 PS2 27 (SUTURE) IMPLANT
SUT MON AB 2-0 CT1 27 (SUTURE) ×2 IMPLANT
SUT MON AB-0 CT1 36 (SUTURE) ×4 IMPLANT
SUT MONOCRYL 0 CTX 36 (SUTURE) ×2
SUT PLAIN 0 NONE (SUTURE) IMPLANT
SUT PLAIN 2 0 (SUTURE)
SUT PLAIN 2 0 XLH (SUTURE) IMPLANT
SUT PLAIN ABS 2-0 CT1 27XMFL (SUTURE) IMPLANT
SYR 20CC LL (SYRINGE) IMPLANT
SYR CONTROL 10ML LL (SYRINGE) ×2 IMPLANT
TOWEL OR 17X24 6PK STRL BLUE (TOWEL DISPOSABLE) ×2 IMPLANT
TRAY FOLEY W/BAG SLVR 14FR LF (SET/KITS/TRAYS/PACK) ×2 IMPLANT

## 2017-11-18 NOTE — H&P (Addendum)
Kim Pacheco is a 32 y.o. female presenting for IOL and TOLAC with history of A2DM poorly controlled on Metformin with pt non compliance. OB History    Gravida  7   Para  1   Term  1   Preterm      AB  5   Living  1     SAB  0   TAB  5   Ectopic      Multiple      Live Births  1          Past Medical History:  Diagnosis Date  . Abnormal Pap smear    normal 06/2013  . Depression    suicide attempt in college  . Diabetes mellitus without complication (HCC)    gestational, no issues since pregnancy per her report  . Gestational diabetes    metformin  . Hepatitis    Hep B, managed by Dr. Kinnie Scales  . History of chicken pox   . History of UTI   . Vaginal Pap smear, abnormal    Past Surgical History:  Procedure Laterality Date  . CESAREAN SECTION N/A 01/26/2013   Procedure: Primary Cesarean Section Delivery Baby  Boy @ 0017, Apgars 4/8/9;  Surgeon: Freddrick March. Tenny Craw, MD;  Location: [facility];  Service: Obstetrics;  Laterality: N/A;  . COLPOSCOPY    . NASAL SINUS SURGERY     Family History: family history is not on file. She was adopted. Social History:  reports that she has quit smoking. Her smoking use included cigarettes. She smoked 1.00 pack per day. She has never used smokeless tobacco. She reports that she does not drink alcohol or use drugs.     Maternal Diabetes: Yes:  Diabetes Type:  Insulin/Medication controlled Genetic Screening: Normal Maternal Ultrasounds/Referrals: Normal Fetal Ultrasounds or other Referrals:  None Maternal Substance Abuse:  No Significant Maternal Medications:  None Significant Maternal Lab Results:  None Other Comments:  None - chronic hep b on Viread  Review of Systems  Constitutional: Negative.   Musculoskeletal: Positive for joint pain.  All other systems reviewed and are negative.  Maternal Medical History:  Contractions: Onset was less than 1 hour ago.   Frequency: rare.   Perceived severity is mild.    Fetal  activity: Perceived fetal activity is normal.   Last perceived fetal movement was within the past hour.    Prenatal complications: no prenatal complications Prenatal Complications - Diabetes: gestational. Diabetes is managed by oral agent (monotherapy).        There were no vitals taken for this visit. Maternal Exam:  Uterine Assessment: Contraction strength is mild.  Contraction frequency is rare.   Abdomen: Patient reports no abdominal tenderness. Surgical scars: low transverse.   Fetal presentation: vertex  Introitus: Normal vulva. Normal vagina.  Ferning test: negative.  Nitrazine test: negative. Amniotic fluid character: not assessed.  Pelvis: questionable for delivery.   Cervix: Cervix evaluated by digital exam.     Physical Exam  Nursing note and vitals reviewed. Constitutional: She is oriented to person, place, and time. She appears well-developed and well-nourished.  HENT:  Head: Normocephalic and atraumatic.  Neck: Normal range of motion. Neck supple.  Cardiovascular: Normal rate and regular rhythm.  Respiratory: Effort normal and breath sounds normal.  GI: Soft. Bowel sounds are normal.  Genitourinary: Vagina normal and uterus normal.  Musculoskeletal: Normal range of motion.  Neurological: She is alert and oriented to person, place, and time. She has normal reflexes.  Skin:  Skin is warm and dry.  Psychiatric: She has a normal mood and affect.    Prenatal labs: ABO, Rh: A/Positive/-- (05/15 0000) Antibody: Negative (05/15 0000) Rubella: Immune (05/15 0000) RPR: Nonreactive (05/15 0000)  HBsAg: Negative (05/15 0000)  HIV: Non-reactive (05/15 0000)  GBS: Positive (06/10 0000)   Assessment/Plan: Class A2DM poorly controlled TOLAC 38+ week IUP Chronic Hepatitis B on Viread Admit Pitocin BS q 4   Jodine Muchmore J 11/18/2017, 7:37 AM

## 2017-11-18 NOTE — Progress Notes (Signed)
Patient ID: Kim Pacheco, female   DOB: 02-Jun-1986, 32 y.o.   MRN: [id number] After further discussion with husband, pt elects to proceed with csection. Consent done. Risks vs benefits discussed.

## 2017-11-18 NOTE — Progress Notes (Signed)
Kim Pacheco is a 32 y.o. [id number] at 3327w1d by LMP admitted for induction of labor due to Diabetes.- poorly controlled  Subjective: Coping well with pain  Objective: BP 113/84   Pulse 77   Temp (!) 97.5 F (36.4 C) (Axillary)   Resp 18   Ht 4\' 11"  (1.499 m)   Wt 71.7 kg (158 lb)   BMI 31.91 kg/m  No intake/output data recorded. No intake/output data recorded.  FHT:  FHR: 135 bpm, variability: moderate,  accelerations:  Present,  decelerations:  Absent UC:   regular, every 2-4 minutes SVE:   Dilation: 3 Effacement (%): 70 Station: -1 Exam by:: dr Billy Coasttaavon  Labs: Lab Results  Component Value Date   WBC 7.9 11/18/2017   HGB 11.7 (L) 11/18/2017   HCT 35.0 (L) 11/18/2017   MCV 89.1 11/18/2017   PLT 148 (L) 11/18/2017    Assessment / Plan: Induction of labor due to gestational diabetes,  progressing well on pitocin  Poorly controlled DM TOLAC  Labor: Progressing normally Preeclampsia:  no signs or symptoms of toxicity Fetal Wellbeing:  Category I Pain Control:  Labor support without medications I/D:  n/a Anticipated MOD:  guarded  Kim Pacheco 11/18/2017, 11:31 AM

## 2017-11-18 NOTE — Anesthesia Procedure Notes (Signed)
Epidural Patient location during procedure: OB Start time: 11/18/2017 1:50 PM End time: 11/18/2017 2:05 PM  Staffing Anesthesiologist: Lewie LoronGermeroth, Dhani Imel, MD Performed: anesthesiologist   Preanesthetic Checklist Completed: patient identified, pre-op evaluation, timeout performed, IV checked, risks and benefits discussed and monitors and equipment checked  Epidural Patient position: sitting Prep: site prepped and draped and DuraPrep Patient monitoring: heart rate, continuous pulse ox and blood pressure Approach: midline Location: L3-L4 Injection technique: LOR air and LOR saline  Needle:  Needle type: Tuohy  Needle gauge: 17 G Needle length: 9 cm Needle insertion depth: 5 cm Catheter type: closed end flexible Catheter size: 19 Gauge Catheter at skin depth: 10 cm Test dose: negative  Assessment Sensory level: T8 Events: blood not aspirated, injection not painful, no injection resistance, negative IV test and no paresthesia  Additional Notes Reason for block:procedure for pain

## 2017-11-18 NOTE — Op Note (Signed)
Cesarean Section Procedure Note  Indications: failure to progress: arrest of dilation  Pre-operative Diagnosis: 38 week 2 day pregnancy.  Post-operative Diagnosis: same  Surgeon: Lenoard AdenAAVON,Glorie Dowlen J   Assistants: Idelle JoSigmon, CNM  Anesthesia: General endotracheal anesthesia, Epidural anesthesia and Local anesthesia 0.25.% bupivacaine  ASA Class: 2  Procedure Details  The patient was seen in the Holding Room. The risks, benefits, complications, treatment options, and expected outcomes were discussed with the patient.  The patient concurred with the proposed plan, giving informed consent. The risks of anesthesia, infection, bleeding and possible injury to other organs discussed. Injury to bowel, bladder, or ureter with possible need for repair discussed. Possible need for transfusion with secondary risks of hepatitis or HIV acquisition discussed. Post operative complications to include but not limited to DVT, PE and Pneumonia noted. The site of surgery properly noted/marked. The patient was taken to Operating Room # 9, identified as Kim Pacheco and the procedure verified as C-Section Delivery. A Time Out was held and the above information confirmed.  After induction of anesthesia, the patient was draped and prepped in the usual sterile manner. A Pfannenstiel incision was made and carried down through the subcutaneous tissue to the fascia. Fascial incision was made and extended transversely using Mayo scissors. The fascia was separated from the underlying rectus tissue superiorly and inferiorly. The peritoneum was identified and entered. Peritoneal incision was extended longitudinally. The utero-vesical peritoneal reflection was incised transversely and the bladder flap was bluntly freed from the lower uterine segment. Thick adhesions in LUS were encountered and lysed.  A low transverse uterine incision(Kerr hysterotomy) was made. Delivered from OA presentation was a  female with Apgar scores of  8 at one minute and 9 at five minutes. Bulb suctioning gently performed. Neonatal team in attendance.After the umbilical cord was clamped and cut cord blood was obtained for evaluation. The placenta was removed intact and appeared normal. The uterus was curetted with a dry lap pack. Good hemostasis was noted.The uterine outline, tubes and ovaries appeared normal. The uterine incision was closed with running locked sutures of 0 Monocryl x 2 layers. Hemostasis was observed. Lavage was carried out until clear.The parietal peritoneum was closed with a running 2-0 Monocryl suture. The fascia was then reapproximated with running sutures of 0 Monocryl. The skin was reapproximated with staples.  Instrument, sponge, and needle counts were correct prior the abdominal closure and at the conclusion of the case.   Findings: LUS peritoneal to bladder flap adhesions  Estimated Blood Loss:  500         Drains: foley                 Specimens: placenta                 Complications:  None; patient tolerated the procedure well.         Disposition: PACU - hemodynamically stable.         Condition: stable  Attending Attestation: I performed the procedure.

## 2017-11-18 NOTE — Anesthesia Procedure Notes (Signed)
Procedure Name: Intubation Date/Time: 11/18/2017 10:17 PM Performed by: Junious SilkGilbert, Treg Diemer, CRNA Pre-anesthesia Checklist: Patient identified, Emergency Drugs available, Suction available, Patient being monitored and Timeout performed Patient Re-evaluated:Patient Re-evaluated prior to induction Oxygen Delivery Method: Circle system utilized Preoxygenation: Pre-oxygenation with 100% oxygen Induction Type: IV induction and Rapid sequence Laryngoscope Size: Glidescope and 3 Grade View: Grade I Tube size: 7.0 mm Number of attempts: 1 Airway Equipment and Method: Video-laryngoscopy Placement Confirmation: ETT inserted through vocal cords under direct vision,  positive ETCO2,  CO2 detector and breath sounds checked- equal and bilateral Secured at: 22 cm Tube secured with: Tape Dental Injury: Teeth and Oropharynx as per pre-operative assessment

## 2017-11-18 NOTE — Anesthesia Preprocedure Evaluation (Addendum)
Anesthesia Evaluation  Patient identified by MRN, date of birth, ID band Patient awake    Reviewed: Allergy & Precautions, H&P , Patient's Chart, lab work & pertinent test results  Airway Mallampati: II  TM Distance: >3 FB Neck ROM: full    Dental no notable dental hx.    Pulmonary former smoker,    Pulmonary exam normal breath sounds clear to auscultation       Cardiovascular negative cardio ROS   Rhythm:regular Rate:Normal     Neuro/Psych PSYCHIATRIC DISORDERS Depression negative neurological ROS     GI/Hepatic negative GI ROS, (+) Hepatitis -  Endo/Other  diabetes  Renal/GU negative Renal ROS     Musculoskeletal   Abdominal   Peds  Hematology negative hematology ROS (+)   Anesthesia Other Findings Diabetes mellitus without complication     Abnormal Pap smear        Depression     Hepatitis    Reproductive/Obstetrics (+) Pregnancy                             Anesthesia Physical  Anesthesia Plan  ASA: III  Anesthesia Plan: Epidural   Post-op Pain Management:    Induction:   PONV Risk Score and Plan:   Airway Management Planned:   Additional Equipment:   Intra-op Plan:   Post-operative Plan:   Informed Consent: I have reviewed the patients History and Physical, chart, labs and discussed the procedure including the risks, benefits and alternatives for the proposed anesthesia with the patient or authorized representative who has indicated his/her understanding and acceptance.     Plan Discussed with:   Anesthesia Plan Comments:         Anesthesia Quick Evaluation

## 2017-11-18 NOTE — Transfer of Care (Signed)
Immediate Anesthesia Transfer of Care Note  Patient: Kim BowerMaria K Sollecito-Olson  Procedure(s) Performed: CESAREAN SECTION (N/A )  Patient Location: PACU  Anesthesia Type:General and Epidural  Level of Consciousness: awake, alert  and oriented  Airway & Oxygen Therapy: Patient Spontanous Breathing and Patient connected to nasal cannula oxygen  Post-op Assessment: Report given to RN and Post -op Vital signs reviewed and stable  Post vital signs: Reviewed and stable  Last Vitals:  Vitals Value Taken Time  BP    Temp    Pulse    Resp 14 11/18/2017 11:03 PM  SpO2    Vitals shown include unvalidated device data.  Last Pain:  Vitals:   11/18/17 2147  TempSrc:   PainSc: 0-No pain         Complications: No apparent anesthesia complications

## 2017-11-18 NOTE — Progress Notes (Addendum)
Kim BowerMaria K Pacheco is a 32 y.o. [id number] at 4612w1d by LMP admitted for induction of labor due to Gestational diabetes-poorly controlled and noncompliant.  Subjective: Inc pain with contractions No success with nitrous Feels better after epidural  Objective: BP 123/72   Pulse 85   Temp 98.4 F (36.9 C) (Oral)   Resp 18   Ht 4\' 11"  (1.499 m)   Wt 71.7 kg (158 lb)   SpO2 100%   BMI 31.91 kg/m  No intake/output data recorded. No intake/output data recorded.  FHT:  FHR: 135 bpm, variability: moderate,  accelerations:  Present,  decelerations:  Absent UC:   regular, every 2 minutes SVE:   11-7098/-1 to 0/LOT BS  90  Labs: Lab Results  Component Value Date   WBC 7.9 11/18/2017   HGB 11.7 (L) 11/18/2017   HCT 35.0 (L) 11/18/2017   MCV 89.1 11/18/2017   PLT 148 (L) 11/18/2017    Assessment / Plan: Induction of labor due to gestational diabetes,  progressing well on pitocin  LOT A2DM TOLAC Chronic Hepatitis B on Viread  Labor: Progressing normally, IUPC placed, reposition with peanut Preeclampsia:  no signs or symptoms of toxicity Fetal Wellbeing:  Category I Pain Control:  epidural I/D:  n/a Anticipated MOD:  guarded  [name] J 11/18/2017, 3:28 PM

## 2017-11-18 NOTE — Progress Notes (Signed)
Kim BowerMaria K Pacheco is a 32 y.o. [id number] at 6862w1d by LMP admitted for induction of labor due to Gestational diabetes-poorly controlled and noncompliant.  Subjective: Inc pain with contractions No success with nitrous  Objective: BP 106/61   Pulse 72   Temp 98.3 F (36.8 C) (Oral)   Resp 18   Ht 4\' 11"  (1.499 m)   Wt 71.7 kg (158 lb)   BMI 31.91 kg/m  No intake/output data recorded. No intake/output data recorded.  FHT:  FHR: 135 bpm, variability: moderate,  accelerations:  Present,  decelerations:  Absent UC:   regular, every 2 minutes SVE:   5-6/100/-1 to 0/LOT  Labs: Lab Results  Component Value Date   WBC 7.9 11/18/2017   HGB 11.7 (L) 11/18/2017   HCT 35.0 (L) 11/18/2017   MCV 89.1 11/18/2017   PLT 148 (L) 11/18/2017    Assessment / Plan: Induction of labor due to gestational diabetes,  progressing well on pitocin  A2DM TOLAC  Labor: Progressing normally Preeclampsia:  no signs or symptoms of toxicity Fetal Wellbeing:  Category I Pain Control:  IV pain meds and Nitrous Oxide I/D:  n/a Anticipated MOD:  guarded  Kim Pacheco J 11/18/2017, 1:40 PM

## 2017-11-18 NOTE — Progress Notes (Signed)
Kim BowerMaria K Pacheco is a 32 y.o. [id number] at 2126w1d by LMP admitted for induction of labor due to Gestational diabetes-poorly controlled and noncompliant.  Subjective: Inc pain with contractions No success with nitrous Feels better after epidural  Objective: BP 123/70   Pulse (!) 101   Temp 98.5 F (36.9 C) (Oral)   Resp 17   Ht 4\' 11"  (1.499 m)   Wt 71.7 kg (158 lb)   SpO2 100%   BMI 31.91 kg/m  No intake/output data recorded. Total I/O In: -  Out: 600 [Urine:600]  FHT:  FHR: 135 bpm, variability: moderate,  accelerations:  Present,  decelerations:  Absent UC:   regular, every 2 minutes SVE:   7-8/100/LOA IUPC 150-170 BS  90  Labs: Lab Results  Component Value Date   WBC 7.9 11/18/2017   HGB 11.7 (L) 11/18/2017   HCT 35.0 (L) 11/18/2017   MCV 89.1 11/18/2017   PLT 148 (L) 11/18/2017    Assessment / Plan: Induction of labor due to gestational diabetes,  progressing well on pitocin  Hypotonic labor A2DM TOLAC Chronic Hepatitis B on Viread  Labor: Inc pitocin Preeclampsia:  no signs or symptoms of toxicity Fetal Wellbeing:  Category I Pain Control:  epidural I/D:  n/a Anticipated MOD:  guarded  Kim Pacheco J 11/18/2017, 6:54 PM

## 2017-11-18 NOTE — Progress Notes (Signed)
Kim Pacheco is a 32 y.o. [id number] at 7782w1d by LMP admitted for induction of labor due to Gestational diabetes-poorly controlled and noncompliant.  Subjective: Feels better after epidural No pressure, no pain  Objective: BP 97/66   Pulse 97   Temp 98.7 F (37.1 C) (Oral)   Resp 18   Ht 4\' 11"  (1.499 m)   Wt 71.7 kg (158 lb)   SpO2 100%   BMI 31.91 kg/m  I/O last 3 completed shifts: In: -  Out: 600 [Urine:600] No intake/output data recorded.  FHT:  FHR 160-170 dec BTBV, no decels, 10x10 accels UC:   regular, every 2 minutes SVE:   7-8/100/LOA IUPC 150-170 BS  90  Labs: Lab Results  Component Value Date   WBC 7.9 11/18/2017   HGB 11.7 (L) 11/18/2017   HCT 35.0 (L) 11/18/2017   MCV 89.1 11/18/2017   PLT 148 (L) 11/18/2017    Assessment / Plan: Acttve phase arrest A2DM TOLAC Fetal tachycardia Chronic Hepatitis B on Viread  Labor: active phase arrest Preeclampsia:  no signs or symptoms of toxicity Fetal Wellbeing:  Category I and category 2 Pain Control:  epidural I/D:  n/a Anticipated MOD:  Recommend csection. New onset fetal tachycardia. Risk factors for infection significant with pos GBS and chronic hepatitis. Pt declines csection. Maternal and fetal risks with expectant management discussed.   Kim Pacheco J 11/18/2017, 8:49 PM

## 2017-11-18 NOTE — Anesthesia Pain Management Evaluation Note (Signed)
  CRNA Pain Management Visit Note  Patient: Kim Pacheco, 32 y.o., female  "Hello I am a [facility] of the anesthesia [facility]. We have an anesthesia team available at all times to provide care throughout the hospital, including epidural management and anesthesia for C-section. I don't know your plan for the delivery whether it a natural birth, water birth, IV sedation, nitrous supplementation, doula or epidural, but we want to meet your pain goals."   1.Was your pain managed to your expectations on prior hospitalizations?   Yes   2.What is your expectation for pain management during this hospitalization?     Labor support without medications, Epidural, IV pain meds and Nitrous Oxide  3.How can we help you reach that goal? Pt open to discussion about all mehtods of pain management. Questions answered  Record the patient's initial score and the patient's pain goal.   Pain: 0  Pain Goal: 8 [facility] wants you to be able to say your pain was always managed very well.  Deandrea Rion 11/18/2017

## 2017-11-19 ENCOUNTER — Encounter (HOSPITAL_COMMUNITY): Payer: Self-pay

## 2017-11-19 LAB — CBC
HCT: 30.7 % — ABNORMAL LOW (ref 36.0–46.0)
HEMOGLOBIN: 10.4 g/dL — AB (ref 12.0–15.0)
MCH: 29.9 pg (ref 26.0–34.0)
MCHC: 33.9 g/dL (ref 30.0–36.0)
MCV: 88.2 fL (ref 78.0–100.0)
Platelets: 150 10*3/uL (ref 150–400)
RBC: 3.48 MIL/uL — ABNORMAL LOW (ref 3.87–5.11)
RDW: 14 % (ref 11.5–15.5)
WBC: 14.1 10*3/uL — AB (ref 4.0–10.5)

## 2017-11-19 LAB — COMPREHENSIVE METABOLIC PANEL
ALK PHOS: 118 U/L (ref 38–126)
ALT: 25 U/L (ref 14–54)
ANION GAP: 8 (ref 5–15)
AST: 75 U/L — ABNORMAL HIGH (ref 15–41)
Albumin: 2.2 g/dL — ABNORMAL LOW (ref 3.5–5.0)
BUN: 10 mg/dL (ref 6–20)
CO2: 19 mmol/L — AB (ref 22–32)
Calcium: 7.9 mg/dL — ABNORMAL LOW (ref 8.9–10.3)
Chloride: 106 mmol/L (ref 101–111)
Creatinine, Ser: 0.68 mg/dL (ref 0.44–1.00)
GFR calc Af Amer: >60 mL/min (ref >60)
GFR calc non Af Amer: >60 mL/min (ref >60)
GLUCOSE: 161 mg/dL — AB (ref 65–99)
POTASSIUM: 4.4 mmol/L (ref 3.5–5.1)
SODIUM: 133 mmol/L — AB (ref 135–145)
Total Bilirubin: 0.4 mg/dL (ref 0.3–1.2)
Total Protein: 5.5 g/dL — ABNORMAL LOW (ref 6.5–8.1)

## 2017-11-19 LAB — RPR: RPR: NONREACTIVE

## 2017-11-19 MED ORDER — LACTATED RINGERS IV SOLN
INTRAVENOUS | Status: DC
  Administered 2017-11-19: 1 mL via INTRAVENOUS

## 2017-11-19 MED ORDER — WITCH HAZEL-GLYCERIN EX PADS: 1.0000 "application " | MEDICATED_PAD | CUTANEOUS | Status: DC | PRN

## 2017-11-19 MED ORDER — DIBUCAINE 1 % RE OINT: 1.0000 "application " | TOPICAL_OINTMENT | RECTAL | Status: DC | PRN

## 2017-11-19 MED ORDER — OXYCODONE-ACETAMINOPHEN 5-325 MG PO TABS
1.0000 | ORAL_TABLET | ORAL | Status: DC | PRN
  Administered 2017-11-20 – 2017-11-21 (×3): 1 via ORAL
  Filled 2017-11-19 (×3): qty 1

## 2017-11-19 MED ORDER — SENNOSIDES-DOCUSATE SODIUM 8.6-50 MG PO TABS
2.0000 | ORAL_TABLET | ORAL | Status: DC
  Administered 2017-11-19 – 2017-11-21 (×2): 2 via ORAL
  Filled 2017-11-19 (×2): qty 2

## 2017-11-19 MED ORDER — NALOXONE HCL 4 MG/10ML IJ SOLN
1.0000 ug/kg/h | INTRAVENOUS | Status: DC | PRN
  Filled 2017-11-19: qty 5

## 2017-11-19 MED ORDER — TETANUS-DIPHTH-ACELL PERTUSSIS 5-2.5-18.5 LF-MCG/0.5 IM SUSP: 0.5000 mL | Freq: Once | INTRAMUSCULAR | Status: DC

## 2017-11-19 MED ORDER — NALBUPHINE HCL 10 MG/ML IJ SOLN: 5.0000 mg | Freq: Once | INTRAMUSCULAR | Status: DC | PRN

## 2017-11-19 MED ORDER — OXYCODONE-ACETAMINOPHEN 5-325 MG PO TABS: 2.0000 | ORAL_TABLET | ORAL | Status: DC | PRN

## 2017-11-19 MED ORDER — SIMETHICONE 80 MG PO CHEW: 80.0000 mg | CHEWABLE_TABLET | ORAL | Status: DC | PRN

## 2017-11-19 MED ORDER — SCOPOLAMINE 1 MG/3DAYS TD PT72
1.0000 | MEDICATED_PATCH | Freq: Once | TRANSDERMAL | Status: DC
  Filled 2017-11-19: qty 1

## 2017-11-19 MED ORDER — ZOLPIDEM TARTRATE 5 MG PO TABS: 5.0000 mg | ORAL_TABLET | Freq: Every evening | ORAL | Status: DC | PRN

## 2017-11-19 MED ORDER — PRENATAL MULTIVITAMIN CH
1.0000 | ORAL_TABLET | Freq: Every day | ORAL | Status: DC
  Administered 2017-11-19 – 2017-11-21 (×3): 1 via ORAL
  Filled 2017-11-19 (×3): qty 1

## 2017-11-19 MED ORDER — SIMETHICONE 80 MG PO CHEW
80.0000 mg | CHEWABLE_TABLET | ORAL | Status: DC
  Administered 2017-11-19 – 2017-11-21 (×2): 80 mg via ORAL
  Filled 2017-11-19 (×2): qty 1

## 2017-11-19 MED ORDER — TENOFOVIR DISOPROXIL FUMARATE 300 MG PO TABS
300.0000 mg | ORAL_TABLET | Freq: Every day | ORAL | Status: DC
  Administered 2017-11-19 – 2017-11-21 (×3): 300 mg via ORAL
  Filled 2017-11-19 (×4): qty 1

## 2017-11-19 MED ORDER — COCONUT OIL OIL: 1.0000 "application " | TOPICAL_OIL | Status: DC | PRN

## 2017-11-19 MED ORDER — DIPHENHYDRAMINE HCL 25 MG PO CAPS: 25.0000 mg | ORAL_CAPSULE | Freq: Four times a day (QID) | ORAL | Status: DC | PRN

## 2017-11-19 MED ORDER — DIPHENHYDRAMINE HCL 25 MG PO CAPS: 25.0000 mg | ORAL_CAPSULE | ORAL | Status: DC | PRN

## 2017-11-19 MED ORDER — IBUPROFEN 600 MG PO TABS
600.0000 mg | ORAL_TABLET | Freq: Four times a day (QID) | ORAL | Status: DC
  Administered 2017-11-19 – 2017-11-21 (×10): 600 mg via ORAL
  Filled 2017-11-19 (×11): qty 1

## 2017-11-19 MED ORDER — SIMETHICONE 80 MG PO CHEW
80.0000 mg | CHEWABLE_TABLET | Freq: Three times a day (TID) | ORAL | Status: DC
  Administered 2017-11-19 – 2017-11-21 (×9): 80 mg via ORAL
  Filled 2017-11-19 (×9): qty 1

## 2017-11-19 MED ORDER — MENTHOL 3 MG MT LOZG: 1.0000 | LOZENGE | OROMUCOSAL | Status: DC | PRN

## 2017-11-19 MED ORDER — METHYLERGONOVINE MALEATE 0.2 MG PO TABS: 0.2000 mg | ORAL_TABLET | ORAL | Status: DC | PRN

## 2017-11-19 MED ORDER — METHYLERGONOVINE MALEATE 0.2 MG/ML IJ SOLN: 0.2000 mg | INTRAMUSCULAR | Status: DC | PRN

## 2017-11-19 MED ORDER — NALBUPHINE HCL 10 MG/ML IJ SOLN: 5.0000 mg | INTRAMUSCULAR | Status: DC | PRN

## 2017-11-19 MED ORDER — OXYTOCIN 40 UNITS IN LACTATED RINGERS INFUSION - SIMPLE MED: 2.5000 [IU]/h | INTRAVENOUS | Status: AC

## 2017-11-19 MED ORDER — DIPHENHYDRAMINE HCL 50 MG/ML IJ SOLN: 12.5000 mg | INTRAMUSCULAR | Status: DC | PRN

## 2017-11-19 MED ORDER — ACETAMINOPHEN 325 MG PO TABS
650.0000 mg | ORAL_TABLET | ORAL | Status: DC | PRN
  Administered 2017-11-19: 650 mg via ORAL
  Filled 2017-11-19: qty 2

## 2017-11-19 NOTE — Lactation Note (Addendum)
This note was copied from a baby's chart. Lactation Consultation Note  Patient Name: Kim Pacheco ZOXWR'UToday's Date: 11/19/2017 Reason for consult: Initial assessment;Early term 3137-38.6wks  P2 mother whose infant is now 318 hours old.  Mother breastfed her 414 1/34 year old son with some difficulties.  Mother was doing STS with baby as I arrived.  Mother had many questions related to breastfeeding.  She expressed an interest in starting shells, shileld, using her own DEBP, feeding according to the clock and latching techniques.  She is quite anxious about completing all her "ideas" as soon as possible.    I explained to her the behaviors of newborns at this stage of life. Mother wanted to feed baby "because she had her eyes open and it was time."  I reviewed feeding cues with her and explained that we watch the baby, not the clock.  I suggested a lot of STS and baby led cues.  I encouraged feeding 8-12 times/ 24 hours or earlier if baby shows cues.    Upon assessment, mother does have soft non tender breasts with flat nipples.  I reassured her that I could provide some aids to help evert nipples.  I provided breast shells and a manual pump with instructions for use.  Mother satisfied with these choices.  She also wanted to start with a NS but I suggested waiting until baby awakens more to begin with that.  Mother also really wanted to try breastfeeding.  I offered to help her and positioned her appropriately in the football position.  Attempted to latch baby onto the right breast but baby was too sleepy.  The baby was not at all interested in latching.  Again, I reassured her that this was normal.  Reviewed hand expression with mother and her right nipple inverted with compressions.  No colostrum drops were noted at this time.  Mom made aware of O/P services, breastfeeding support groups, community resources, and our phone # for post-discharge questions. Father present and sleeping on  couch.  Mother will call for assistance as needed.  I will be sure to have day shift LC visit mother later this a.m. For help.  Mother had no further questions/concerns as of now.   Maternal Data Formula Feeding for Exclusion: No Has patient been taught Hand Expression?: Yes Does the patient have breastfeeding experience prior to this delivery?: Yes  Feeding Feeding Type: Breast Fed Length of feed: 0 min  LATCH Score Latch: Too sleepy or reluctant, no latch achieved, no sucking elicited.  Audible Swallowing: None  Type of Nipple: Flat  Comfort (Breast/Nipple): Soft / non-tender  Hold (Positioning): Assistance needed to correctly position infant at breast and maintain latch.  LATCH Score: 4  Interventions Interventions: Breast feeding basics reviewed;Assisted with latch;Skin to skin;Breast massage;Hand express;Position options;Support pillows;Adjust position;Breast compression;Shells;Hand pump  Lactation Tools Discussed/Used     Consult Status Consult Status: Follow-up Date: 11/20/17 Follow-up type: In-patient    Morgann Woodburn R Fain Francis 11/19/2017, 6:30 AM

## 2017-11-19 NOTE — Progress Notes (Signed)
MOB was referred for history of depression/anxiety. * Referral screened out by Clinical Social Worker because none of the following criteria appear to apply: ~ History of anxiety/depression during this pregnancy, or of post-partum depression. ~ Diagnosis of anxiety and/or depression within last 3 years OR * MOB's symptoms currently being treated with medication and/or therapy. Please contact the Clinical Social Worker if needs arise, by MOB request, or if MOB scores greater than 9/yes to question 10 on Edinburgh Postpartum Depression Screen.  Ebonique Hallstrom Boyd-Gilyard, MSW, LCSW Clinical Social Work [phone] 

## 2017-11-19 NOTE — Lactation Note (Signed)
This note was copied from a baby's chart. Lactation Consultation Note  Patient Name: Kim Pacheco ZOXWR'UToday's Date: 11/19/2017 Reason for consult: Follow-up assessment;Difficult latch;Early term 5537-38.6wks  P2 mother who is requesting latch assistance  Infant awake, alert and beginning to fuss as I entered the room.  Offered to assist with latch and mother accepted.  Mother's breasts are soft and non tender and she has inverted nipples.  I started her with breast shells and a manual pump last night and encouraged her to continue using both of these tools.  Reminded her to hand express prior to latch, pump with manual pump to evert nipples and then continue with hand expression after feeding.  Mother is also going to pump with the DEBP after feedings to increase milk supply for baby.    Attempted to latch baby onto right breast in the football hold.  She had a wide open mouth and was able to grasp the breast.  However, she lost interest soon after latching.  Continued a few more times to latch and she does grasp but cannot hold the flat nipple in her mouth to effectively suck.  Mother does not want to use a NS.  She asked if she could bottle feed baby because she is hungry.  Daron OfferGerber GoodStart at bedside and mother nippled 10 mls easily.  Baby burped and was content.  Encouraged mother to call for latch assistance as needed. Father present.  Maternal Data Formula Feeding for Exclusion: No Has patient been taught Hand Expression?: Yes Does the patient have breastfeeding experience prior to this delivery?: Yes  Feeding Feeding Type: Breast Fed Length of feed: 0 min  LATCH Score Latch: Repeated attempts needed to sustain latch, nipple held in mouth throughout feeding, stimulation needed to elicit sucking reflex.  Audible Swallowing: None  Type of Nipple: Flat  Comfort (Breast/Nipple): Soft / non-tender  Hold (Positioning): Assistance needed to correctly position infant at breast  and maintain latch.  LATCH Score: 5  Interventions Interventions: Breast feeding basics reviewed;Assisted with latch;Skin to skin;Breast massage;Hand express;Position options;Support pillows;Adjust position;Breast compression;Shells;Hand pump;DEBP  Lactation Tools Discussed/Used     Consult Status Consult Status: Follow-up Date: 11/20/17 Follow-up type: In-patient    Alvie Fowles R Ayleah Hofmeister 11/19/2017, 8:07 PM

## 2017-11-19 NOTE — Lactation Note (Signed)
This note was copied from a baby's chart. Lactation Consultation Note  Patient Name: Kim Pacheco ZOXWR'UToday's Date: 11/19/2017 Reason for consult: Follow-up assessment  Parents had called me into room b/c they felt infant was ready to eat. Infant was sleeping at the breast, however. Initial newborn behavior (sleepy) discussed (infant less than 24 hours old).  Since it had been some time since infant had eaten, hand expression was taught to Mom & the resulting colostrum was spoon-fed to the infant.   Mom w/invaginated nipples that respond well to pumping. Since Mom is Hep B+, she understands that if her nipples were to be cracked or bleeding, she would need to temporarily stop nursing from the affected side (and maintain milk supply by pumping & discarding). Mom takes tenofovir.  Mom received general anesthesia for her C/S; infant has already received a couple of bottles. However, mother does not want to introduce nipple confusion through a bottle or a nipple shield. If formula supplementation were to be needed, parents prefer a cup at this time.   Mom has a hx of abundant supply. With her 1st child, she could pump 6-7 oz/session.    Kim Pacheco, Kim Pacheco [facility] 11/19/2017, 5:29 PM

## 2017-11-19 NOTE — Progress Notes (Addendum)
POSTOPERATIVE DAY # 1 S/P CS - arrest of dilation  S:         Reports feeling sore and surgery site hurts / mild dizziness when up at bedside             Tolerating po intake / no nausea / no vomiting / no flatus / no BM             Bleeding is light             Pain controlled withlong-acting narcotic and motrin             Up ad lib / ambulatory at bedside x 1  Newborn Breast   O:  VS: BP 111/61 (BP Location: Right Arm)   Pulse 76   Temp 98.6 F (37 C) (Oral)   Resp 18   Ht 4\' 11"  (1.499 m)   Wt 71.7 kg (158 lb)   SpO2 97%   Breastfeeding? Unknown   BMI 31.91 kg/m   LABS:              Recent Labs    11/18/17 2341 11/19/17 0636  WBC 12.5* 14.1*  HGB 10.3* 10.4*  PLT 125* 150               Bloodtype: --/--/A POS (06/11 0745)  Rubella: Immune (05/15 0000)                                             I&O: Intake/Output      06/11 0701 - 06/12 0700 06/12 0701 - 06/13 0700   I.V. (mL/kg) 2500 (34.9)    Total Intake(mL/kg) 2500 (34.9)    Urine (mL/kg/hr) 2600 (1.5)    Blood 599    Total Output 3199    Net -699                     Physical Exam:             Alert and Oriented X3  Lungs: Clear and unlabored  Heart: regular rate and rhythm / no mumurs  Abdomen: soft, non-tender, non-distended, active BS             Fundus: firm, non-tender, Ueven             Dressing intact              Incision:  approximated with suture / no erythema / no ecchymosis / no drainage  Perineum: intact / Foley - dark yellow urine  Lochia: light  Extremities: 1+ pedal edema, no calf pain or tenderness, SCD in place  A:       POD # 1 S/P CS - arrest of decsent            Dependent edema            Mild IDA of pregnancy - stable  P:        Routine postoperative care              Advance activity - consider early DC tomorrow    Kim Pacheco CNM, MSN, [facility] 11/19/2017, 10:10 AM

## 2017-11-19 NOTE — Anesthesia Postprocedure Evaluation (Signed)
Anesthesia Post Note  Patient: Kim Pacheco  Procedure(s) Performed: CESAREAN SECTION (N/A )     Patient location during evaluation: PACU Anesthesia Type: Epidural and General Level of consciousness: awake and alert Pain management: pain level controlled Vital Signs Assessment: post-procedure vital signs reviewed and stable Respiratory status: spontaneous breathing, nonlabored ventilation and respiratory function stable Cardiovascular status: blood pressure returned to baseline and stable Postop Assessment: no apparent nausea or vomiting, no backache, epidural receding and patient able to bend at knees Anesthetic complications: no Comments: Epidural placed for labor. Dosed for c-section, but due to inadequate patient comfort, induced general anesthesia. See chart for details.    Last Vitals:  Vitals:   11/19/17 0215 11/19/17 0232  BP: 123/73 110/66  Pulse: 69 73  Resp: 16 18  Temp: 36.7 C 36.5 C  SpO2: 98% 98%    Last Pain:  Vitals:   11/19/17 0232  TempSrc: Oral  PainSc:    Pain Goal:                 Beryle Lathehomas E Corrinna Karapetyan

## 2017-11-19 NOTE — Anesthesia Postprocedure Evaluation (Signed)
Anesthesia Post Note  Patient: Hyman BowerMaria K Sollecito-Olson  Procedure(s) Performed: CESAREAN SECTION (N/A )     Patient location during evaluation: Mother Baby Anesthesia Type: Epidural and General Level of consciousness: awake, awake and alert and oriented Pain management: pain level controlled Vital Signs Assessment: post-procedure vital signs reviewed and stable Respiratory status: spontaneous breathing, nonlabored ventilation and respiratory function stable Cardiovascular status: stable Postop Assessment: no headache, no backache, patient able to bend at knees, no apparent nausea or vomiting, adequate PO intake and able to ambulate Anesthetic complications: no    Last Vitals:  Vitals:   11/19/17 0730 11/19/17 1024  BP:  (!) 96/52  Pulse:  72  Resp:    Temp:  37.2 C  SpO2: 98%     Last Pain:  Vitals:   11/19/17 0528  TempSrc: Oral  PainSc: 0-No pain   Pain Goal:                 Kathan Kirker

## 2017-11-20 LAB — GLUCOSE, RANDOM: Glucose, Bld: 108 mg/dL — ABNORMAL HIGH (ref 65–99)

## 2017-11-20 MED ORDER — MAGNESIUM OXIDE 400 (241.3 MG) MG PO TABS
400.0000 mg | ORAL_TABLET | Freq: Once | ORAL | Status: AC
  Administered 2017-11-21: 400 mg via ORAL
  Filled 2017-11-20: qty 1

## 2017-11-20 NOTE — Progress Notes (Signed)
POSTOPERATIVE DAY # 2 S/P CS  S:         Reports feeling sore with movement / belly bloated             Tolerating po intake / no nausea / no vomiting / + flatus / no BM             Bleeding is light             Pain controlled with Motrin only             Up ad lib / ambulatory/ voiding QS  Newborn Breast   O:  VS: BP (!) 110/52 (BP Location: Left Arm)   Pulse 74   Temp 98.1 F (36.7 C) (Oral)   Resp 18   Ht 4\' 11"  (1.499 m)   Wt 71.7 kg (158 lb)   SpO2 99%   Breastfeeding? Unknown   BMI 31.91 kg/m   LABS:              Recent Labs    11/18/17 2341 11/19/17 0636  WBC 12.5* 14.1*  HGB 10.3* 10.4*  PLT 125* 150               Bloodtype: --/--/A POS (06/11 0745)  Rubella: Immune (05/15 0000)                Hep B carrier - taking daily Viread / newborn Hep B prophylaxis initiated                                  I&O: Intake/Output      06/12 0701 - 06/13 0700 06/13 0701 - 06/14 0700   P.O. 1520    I.V. (mL/kg)     Total Intake(mL/kg) 1520 (21.2)    Urine (mL/kg/hr) 2625 (1.5)    Blood     Total Output 2625    Net -1105                    Physical Exam:             Alert and Oriented X3  Lungs: Clear and unlabored  Heart: regular rate and rhythm / no mumurs  Abdomen: soft, non-tender, moderately distended, active BS             Fundus: firm, non-tender, Ueven             Dressing intact              Incision:  approximated with suture / no erythema / no ecchymosis / no drainage  Perineum: intact  Lochia: light  Extremities: trace edema, no calf pain or tenderness  A:        POD # 2 S/P CS            Mild gestational IDA and thrombocytopenia            Hep B carrier on antivirals / newborn prophylaxis initiated  P:        Routine postoperative care - ambulate / warm fluids to increase bowel motility             Anticipate DC tomorrow   Marlinda Mikeanya Aaliyan Brinkmeier CNM, MSN, [facility] 11/20/2017, 10:20 AM

## 2017-11-21 MED ORDER — OXYCODONE-ACETAMINOPHEN 5-325 MG PO TABS: 1.0000 | ORAL_TABLET | ORAL | 0 refills | Status: DC | PRN

## 2017-11-21 MED ORDER — IBUPROFEN 600 MG PO TABS: 600.0000 mg | ORAL_TABLET | Freq: Four times a day (QID) | ORAL | 0 refills | Status: DC

## 2017-11-21 NOTE — Progress Notes (Signed)
CSW received consult due to score 12 on Edinburgh Depression Screen.  When CSW arrived, MOB was holding baby and appeared to be bonding. CSW explained CSW role and encouraged MOB to ask questions. MOB was flat and appeared to be tearful.  When CSW asked MOB's about her mood and emotions, MOB begin to cry. MOB explained family stressors with MOB's family and stress about moving.  CSW validated and normalized MOB's thoughts and feelings.   CSW reviewed MOB's EDPS results and processed ways that MOB can allow MOB to support MOB postpartum. CSW provided education regarding Baby Blues vs PMADs and provided MOB with information [facility].  CSW encouraged MOB to evaluate her mental health throughout the postpartum period with the use of the New Mom Checklist developed by Postpartum Progress and notify a medical professional if symptoms arise.  CSW assessed for safety and MOB denied SI, HI, and DV.  MOB acknowledged MOB has a therapist with [facility] and agreed to schedule an appointment within the next 2 weeks.   There are no barriers to discharge.   Blaine HamperAngel Boyd-Gilyard, MSW, LCSW Clinical Social Work [phone]

## 2017-11-21 NOTE — Lactation Note (Signed)
This note was copied from a baby's chart. Lactation Consultation Note  Patient Name: [name] BJYNW'GToday's Date: 11/21/2017 Reason for consult: Follow-up assessment;Difficult latch Mom teary this morning.  Baby was bottle fed during the night but mom would like to work on putting baby to breast.  Pre pumped and hand expression done.with a few drops of milk expressed.  Mom's nipples invert with compression.  She does not want to try a nipple shield.  Baby opened wide and latched initially but she fell asleep quickly.  Mother needed to use the bathroom so baby swaddled and put back in crib.  Instructed to continue pumping every 3 hours and give any breastmilk back to baby.  Lactation outpatient services and support information reviewed and encouraged prn.  Maternal Data    Feeding Feeding Type: Breast Fed Nipple Type: Slow - flow Length of feed: 5 min  LATCH Score Latch: Repeated attempts needed to sustain latch, nipple held in mouth throughout feeding, stimulation needed to elicit sucking reflex.  Audible Swallowing: None  Type of Nipple: Inverted  Comfort (Breast/Nipple): Soft / non-tender  Hold (Positioning): Assistance needed to correctly position infant at breast and maintain latch.  LATCH Score: 4  Interventions Interventions: Assisted with latch;Breast compression;Shells;Adjust position;Breast massage;Support pillows;Hand pump;Hand express;Position options  Lactation Tools Discussed/Used     Consult Status Consult Status: Complete Follow-up type: Call as needed    Huston FoleyMOULDEN, Alvaretta Eisenberger S 11/21/2017, 12:13 PM

## 2017-11-21 NOTE — Progress Notes (Addendum)
tol po, voiding w/o difficulty, ambulating; +flatus and s/p bm this am Normal lochia; pain controlled  Pt had family leave room then began crying; having hard time that didn't have vbac- feels like a failure, not enjoying baby as much and feels that she is not a good second time mom; no si/hi; h/o depression in childhood and later in life, was on meds in high school; also feelign stressed about difficulty nursing - discussed Advertising copywriterlactation consultant both inpatient and outpatient Has a therapist; would like to begin medication and reviewed zoloft  Temp:  [98.3 F (36.8 C)-98.6 F (37 C)] 98.6 F (37 C) (06/14 0655) Pulse Rate:  [68-76] 68 (06/14 0655) Resp:  [14-17] 14 (06/14 0655) BP: (118-123)/(58-95) 123/95 (06/14 0655) SpO2:  [98 %] 98 % (06/14 0655)  A&ox3 rrr ctab Abd: +bs, soft, nt, nd; fundus firm and below umb; dressing c/d/i LE: no edema, nt bilat LE  CBC Latest Ref Rng & Units 11/19/2017 11/18/2017 11/18/2017  WBC 4.0 - 10.5 K/uL 14.1(H) 12.5(H) 7.9  Hemoglobin 12.0 - 15.0 g/dL 10.4(L) 10.3(L) 11.7(L)  Hematocrit 36.0 - 46.0 % 30.7(L) 30.9(L) 35.0(L)  Platelets 150 - 400 K/uL 150 125(L) 148(L)   A/p: pod 3 s/p rtlcs, failed tolac 1. Doing well post op, plan d/c home today with office f/u 2. Pp depression - begin zoloft and plan 2 wk office f/u; call therapist today and sched appt asap; reviewed with social work and to speak with pt now about resources and depression scales to monitor at home 3. Chronic anemia, acute change - asymptomatic, plan iron q day pp 4. Hep b carrier - infant on prophylaxis 5. Thrombocytopenia, improved pp 6. Poorly controlled gdma2 - plan f/u as outpt (2hr gtt) 7. Encourage lactation assistance

## 2017-12-01 DIAGNOSIS — F419 Anxiety disorder, unspecified: Secondary | ICD-10-CM | POA: Diagnosis not present

## 2017-12-02 NOTE — Discharge Summary (Signed)
Obstetric Discharge Summary Reason for Admission: induction of labor Prenatal Procedures: none Intrapartum Procedures: cesarean: low cervical, transverse Postpartum Procedures: none Complications-Operative and Postpartum: none Hemoglobin  Date Value Ref Range Status  11/19/2017 10.4 (L) 12.0 - 15.0 g/dL Final   HCT  Date Value Ref Range Status  11/19/2017 30.7 (L) 36.0 - 46.0 % Final    Physical Exam:  General: alert, cooperative and no distress Lochia: appropriate Uterine Fundus: firm Incision: healing well DVT Evaluation: No evidence of DVT seen on physical exam.  Discharge Diagnoses: rltcs, uncontrolled gdm, pp depression  Discharge Information: Date: 12/02/2017 Activity: pelvic rest and lifting restrictions Diet: routine Medications: Iron and Percocet Condition: stable Instructions: refer to practice specific booklet Discharge to: home Follow-up Information    Kim Pacheco, Richard, MD Follow up.   Specialty:  Obstetrics and Gynecology Contact information: [address] [place] [id number] [phone]           Newborn Data: Live born female  Birth Weight: 7 lb 15.3 oz (3609 g) APGAR: 8, 8  Newborn Delivery   Birth date/time:  11/18/2017 22:23:00 Delivery type:  C-Section, Low Transverse Trial of labor:  Yes C-section categorization:  Repeat     Home with mother.  Kim Pacheco 12/02/2017, 6:03 PM

## 2017-12-05 DIAGNOSIS — F53 Postpartum depression: Secondary | ICD-10-CM | POA: Diagnosis not present

## 2017-12-31 DIAGNOSIS — Z124 Encounter for screening for malignant neoplasm of cervix: Secondary | ICD-10-CM | POA: Diagnosis not present

## 2018-01-06 ENCOUNTER — Ambulatory Visit: Payer: Self-pay

## 2018-01-06 NOTE — Lactation Note (Signed)
This note was copied from a baby's chart. 01/06/2018  Name: Lakeisha Waldrop MRN: [id number] Date of Birth: 11/18/2019 Gestational Age: Gestational Age: [redacted]w[redacted]d Birth Weight: 127.3 oz Weight today:    10 pounds 5.8 ounces (4700 grams) with clean size 1 diaper  Jaclynn Guarneri has gained 1300 grams in the last 46 days with an average daily weight gain of 28 grams a day.   Jaclynn Guarneri is a early term infant who presents today with mom for feeding assessment. Mom reports she would like to see how BF is going and how well infant is transferring. Mom reports her nipples are flat and inverted and infant is now latching better. She reports her nipple is usually longer post latch and sometimes compressed.   Mom BF infant 5-6 x during the day using both breasts. She gives bottles of formula during the night as she does not want to get up and get the breast milk and heat it up. Reviewed with mom that EBM is atable for 6-8 hours at room temperature and to keep her last pumping of the day at the bedside to use during the night. Enc mom to also put infant to the breast at night. Infant wakes up 1-3 x a night.   Mom reports infant gets bottles 3-5 x a day and takes 4-6 ounces a feeding. She gets EBM during the day and formula at night.   Mom is taking Fenugreek 6 capsules a day for about 2 weeks. She has noticed an slight increase in her supply. She would like to build her supply so to have enough to store some. Advised mom she can increase to 3 capsules TID if she would like. Discussed Fenugreek will not work if breast not being emptied regularly.   Mom is pumping 2-3 x a day during the day and before bed. She is getting 2-5 ounces a pumping. She is not pumping for up to 8 hours at night. Discussed supply and demand and prevention of plugged ducts by not going for long spans without pumping at night. Enc mom to offer infant the breast at night or to pump at least once at night.   Mom reports she has a plug  in her right breast that started yesterday. She has been using warm compresses, massage, BF infant and pumping to loosen clog. No redness to breast. Mom denies fever. Mom reports is feels better today. Enc mom to hand express post pumping to make sure clogs are released. Mom aware to call OB for fever or flu like symptoms.   Infant with thick labial frenulum that inserts at the bottom of the gum ridge. Upper lip is tight and blanches with flanging. Infant with sucking blister noted to center of top lip. Lip flanges well on the breast. Infant with good tongue extension and lateralization. Infant with posterior lingual frenulum noted on manual exam with some limitation to mid tongue elevation. She gags a lot when suckling on gloved finger and on the breast per mom.  Infant with strong suckle on gloved finger with good tongue extension and tongue cupping.  Infant clicks on the left breast which is the higher producing breast. Mom reports infant drools and chokes on the bottle also. Reviewed how Tongue and lip restrictions may or may not effect breast feeding and milk supply. Mom given website information and local provider list to pursue if she and dad would like.   Mom reports her son had an anterior Tongue tie that was revised at around  3-4 months. Mom reports she was informed of Tongue and lip tie, she took infant to ENT who told her not to worry about the tongue and that she would need general anesthesia for lip tie revision.   Infant latched to the right breast in the football hold. She fed for about 15 minutes and transferred 24 ml. Nipple was slightly compressed post feeding. Mom denies pain with feeding. Infant then latched to the left breast in the cross cradle hold. Mom everts nipple prior to latching infant. Mom describes pain to the left nipple of 2-3 with initial latch that improves with feeding. Infant fell asleep quickly and transferred 22 ml. Infant reawakened and relatched to the breast and  transferred an additional 28 ml. Infant was asleep post feeding.   Checked mom's flange size, she is using the Spectra # 25 flanges which are thought to be a good fit.   Infant to follow up with Pediatrician on August 20. Mom has been attending BF Support Groups. Mom will call to reschedule with Lactation as needed.   Mom reports all Questions/concerns have been answered.     General Information: Mother's reason for visit: Wants to see how BF is going, Flat/inverted nipples, infant is able to latch to breast now more consistently Consult: Initial Lactation consultant: Noralee Stain RN,IBCLC Breastfeeding experience: infant latching better, pain to left nipple with initial latch Maternal medical conditions: Gestational diabetes mellitus, History post partum depression Maternal medications: Pre-natal vitamin, Other(Zoloft for PPD, Tenivir for Hep B, Fenugreek 610 mg Capsules 2 TID)  Breastfeeding History: Frequency of breast feeding: 4-5 x a day Duration of feeding: 30 minutes-1 hour  Supplementation: Supplement method: bottle(Dr. Brown's Level 1 nipple) Brand: (various brands) Formula volume: 4 ounces  Formula frequency: 1-3 x a day Total formula volume per day: 4-12 ounces Breast milk volume: 4-6 ounces Breast milk frequency: 2-3 x a day Total breast milk volume per day: 4-12 ounces Pump type: Spectra Pump frequency: 2-3 x a day Pump volume: 2-5 ounces  Infant Output Assessment: Voids per 24 hours: 6-8  Urine color: Clear yellow Stools per 24 hours: 2  Stool color: Yellow  Breast Assessment: Breast: Soft, Compressible Nipple: Flat, Inverted, Erect(Erect with stimulation and after BF) Pain level: 2 Pain interventions: Bra  Feeding Assessment: Infant oral assessment: Variance Infant oral assessment comment: Infant with thick labial frenulum that inserts at the bottom of the gum ridge. Upper lip is tight and blanches with flanging. Infant with sucking blister noted to  center of top lip. Lip flanges well on the breast. Infant with good tongue extension and lateralization. Infant with posterior lingual frenulum noted on manual exam with some limitation to mid tongue elevation. She gags a lot when suckling on gloved finger and on the breast per mom.  Infant with strong suckle on gloved finger with good tongue extension and tongue cupping.  Infant clicks on the left breast which is the higher producing breast. Mom reports infant drools and chokes on the bottle also.  Positioning: Football(right breast) Latch: 2 - Grasps breast easily, tongue down, lips flanged, rhythmical sucking. Audible swallowing: 2 - Spontaneous and intermittent Type of nipple: 2 - Everted at rest and after stimulation Comfort: 2 - Soft/non-tender Hold: 2 - No assistance needed to correctly position infant at breast LATCH score: 10 Latch assessment: Deep Lips flanged: Yes Suck assessment: Displays both   Pre-feed weight: 4700 grams Post feed weight: 4724 grams Amount transferred: 24 ml Amount supplemented: 0  Additional Feeding Assessment:  Infant oral assessment: Variance Infant oral assessment comment: Infant with thick labial frenulum that inserts at the bottom of the gum ridge. Upper lip is tight and blanches with flanging. Infant with sucking blister noted to center of top lip. Lip flanges well on the breast. Infant with good tongue extension and lateralization. Infant with posterior lingual frenulum noted on manual exam with some limitation to mid tongue elevation. She gags a lot when suckling on gloved finger and on the breast per mom.  Infant with strong suckle on gloved finger with good tongue extension and tongue cupping.  Infant clicks on the left breast which is the higher producing breast. Mom reports infant drools and chokes on the bottle also.  Positioning: Cross cradle(left breast) Latch: 1 - Repeated attempts neede to sustain latch, nipple held in mouth throughout feeding,  stimulation needed to elicit sucking reflex. Audible swallowing: 2 - Spontaneous and intermittent Type of nipple: 2 - Everted at rest and after stimulation Comfort: 1 - Filling, red/small blisters or bruises, mild/mod discomfort Hold: 2 - No assistance needed to correctly position infant at breast LATCH score: 8 Latch assessment: Deep Lips flanged: Yes Suck assessment: Displays both   Pre-feed weight: 4724 grams Post feed weight: 4774 grams Amount transferred: 50 ml Amount supplemented: 0  Totals: Total amount transferred: 74 ml Total supplement given: 0 Total amount pumped post feed: 0   Ed BlalockSharon S Dorlis Judice RN, IBCLC                                                      Ed BlalockSharon S Araceli Coufal 01/06/2018, 8:39 AM

## 2018-02-25 DIAGNOSIS — B181 Chronic viral hepatitis B without delta-agent: Secondary | ICD-10-CM | POA: Diagnosis not present

## 2018-02-26 ENCOUNTER — Other Ambulatory Visit: Payer: Self-pay | Admitting: Nurse Practitioner

## 2018-02-26 DIAGNOSIS — B181 Chronic viral hepatitis B without delta-agent: Secondary | ICD-10-CM

## 2018-03-06 ENCOUNTER — Other Ambulatory Visit: Payer: Self-pay | Admitting: Nurse Practitioner

## 2018-03-12 ENCOUNTER — Other Ambulatory Visit: Payer: BLUE CROSS/BLUE SHIELD

## 2018-03-18 ENCOUNTER — Ambulatory Visit
Admission: RE | Admit: 2018-03-18 | Discharge: 2018-03-18 | Disposition: A | Discharge status: Home / Self Care | Payer: BLUE CROSS/BLUE SHIELD | Source: Ambulatory Visit | Attending: Nurse Practitioner | Admitting: Nurse Practitioner

## 2018-03-18 DIAGNOSIS — B181 Chronic viral hepatitis B without delta-agent: Secondary | ICD-10-CM

## 2018-03-18 DIAGNOSIS — K824 Cholesterolosis of gallbladder: Secondary | ICD-10-CM | POA: Diagnosis not present

## 2018-04-02 DIAGNOSIS — O24419 Gestational diabetes mellitus in pregnancy, unspecified control: Secondary | ICD-10-CM | POA: Diagnosis not present

## 2018-04-02 DIAGNOSIS — O24434 Gestational diabetes mellitus in the puerperium, insulin controlled: Secondary | ICD-10-CM | POA: Diagnosis not present

## 2018-04-09 ENCOUNTER — Ambulatory Visit (INDEPENDENT_AMBULATORY_CARE_PROVIDER_SITE_OTHER): Payer: BLUE CROSS/BLUE SHIELD | Admitting: Family Medicine

## 2018-04-09 ENCOUNTER — Encounter: Payer: Self-pay | Admitting: Family Medicine

## 2018-04-09 VITALS — BP 96/62 | HR 78 | Temp 98.0°F | Ht 59.0 in | Wt 135.6 lb

## 2018-04-09 DIAGNOSIS — L600 Ingrowing nail: Secondary | ICD-10-CM | POA: Diagnosis not present

## 2018-04-09 DIAGNOSIS — Z23 Encounter for immunization: Secondary | ICD-10-CM

## 2018-04-09 DIAGNOSIS — K760 Fatty (change of) liver, not elsewhere classified: Secondary | ICD-10-CM

## 2018-04-09 DIAGNOSIS — B181 Chronic viral hepatitis B without delta-agent: Secondary | ICD-10-CM | POA: Diagnosis not present

## 2018-04-09 DIAGNOSIS — L03032 Cellulitis of left toe: Secondary | ICD-10-CM | POA: Diagnosis not present

## 2018-04-09 NOTE — Progress Notes (Signed)
HPI:  Using dictation device. Unfortunately this device frequently misinterprets words/phrases.  Acute visit for ingrown toenails.  Reports is been dealing with this for a long time.  Both great toenails are ingrown.  From time to time they become infected and she gets sore skin around the nail.  She wants to see a podiatrist for removal of both of her toenails.  She is requesting a referral.  Right now she has a little bit of pain around the left great toenail where it is ingrown. She has not been in in a while.  Reports she is doing well.  Had another child since her last visit here.  She is seeing Dawn Drezek about the hepatitis.  Reports she is doing much better.  Now on medicine and is staying on it.  Also reports she has fatty liver.  ROS: See pertinent positives and negatives per HPI.  Past Medical History:  Diagnosis Date  . Abnormal Pap smear    normal 06/2013  . Depression    suicide attempt in college  . Diabetes mellitus without complication (HCC)    gestational, no issues since pregnancy per her report  . Gestational diabetes    metformin  . Hepatitis    Hep B, managed by Dr. Kinnie Scales  . History of chicken pox   . History of UTI   . Vaginal Pap smear, abnormal     Past Surgical History:  Procedure Laterality Date  . CESAREAN SECTION N/A 01/26/2013   Procedure: Primary Cesarean Section Delivery Baby  Boy @ 0017, Apgars 4/8/9;  Surgeon: Freddrick March. Tenny Craw, MD;  Location: [facility];  Service: Obstetrics;  Laterality: N/A;  . CESAREAN SECTION N/A 11/18/2017   Procedure: CESAREAN SECTION;  Surgeon: Olivia Mackie, MD;  Location: [facility];  Service: Obstetrics;  Laterality: N/A;  . COLPOSCOPY    . NASAL SINUS SURGERY      Family History  Adopted: Yes    SOCIAL HX: See HPI   Current Outpatient Medications:  .  HEATHER 0.35 MG tablet, , Disp: , Rfl: 6 .  sertraline (ZOLOFT) 50 MG tablet, , Disp: , Rfl: 1 .  tenofovir (VIREAD) 300 MG tablet, Take 300 mg by mouth  daily., Disp: , Rfl:   EXAM:  Vitals:   04/09/18 0928  BP: 96/62  Pulse: 78  Temp: 98 F (36.7 C)    Body mass index is 27.39 kg/m.  GENERAL: vitals reviewed and listed above, alert, oriented, appears well hydrated and in no acute distress  HEENT: atraumatic, conjunttiva clear, no obvious abnormalities on inspection of external nose and ears  NECK: no obvious masses on inspection  SKIN:   MS: moves all extremities without noticeable abnormality  PSYCH: pleasant and cooperative, no obvious depression or anxiety  ASSESSMENT AND PLAN:  Discussed the following assessment and plan:  Ingrown toenail - Plan: Ambulatory referral to Podiatry  Paronychia of great toe, left  Hepatitis B carrier (HCC)  Fatty liver  -Referral placed per her request -In interim, treat possible mild developing paronychia with soaks and topical antibiotic ointment, follow-up here if needed if worsening -Advised healthy Mediterranean diet and regular aerobic exercise, avoidance of alcohol and Tylenol for her liver and elevated BMI and continued follow-up with her liver specialist for management of her liver disease -She wants her flu shot today -Patient advised to return or notify a doctor immediately if symptoms worsen or persist or new concerns arise.  Patient Instructions  BEFORE YOU LEAVE: -flu shot -follow up:  yearly for physical  -soaks twice daily, topical antibiotic ointment  -I hope you are feeling better soon! Seek care promptly if your symptoms worsen, new concerns arise or you are not improving with treatment.    -We placed a referral for you as discussed. It usually takes about 1-2 weeks to process and schedule this referral. If you have not heard from [place] regarding this appointment in 2 weeks please contact our office.   We recommend the following healthy lifestyle for LIFE: 1) Small portions. But, make sure to get regular (at least 3 per day), healthy meals and small healthy  snacks if needed.  2) Eat a healthy clean diet.   TRY TO EAT: -at least 5-7 servings of low sugar, colorful, and nutrient rich vegetables per day (not corn, potatoes or bananas.) -berries are the best choice if you wish to eat fruit (only eat small amounts if trying to reduce weight)  -lean meets (fish, white meat of chicken or [place]) -vegan proteins for some meals - beans or tofu, whole grains, nuts and seeds -Replace bad fats with good fats - good fats include: fish, nuts and seeds, canola oil, olive oil -small amounts of low fat or non fat dairy -small amounts of100 % whole grains - check the lables -drink plenty of water  AVOID: -SUGAR, sweets, anything with added sugar, corn syrup or sweeteners - must read labels as even foods advertised as "healthy" often are loaded with sugar -if you must have a sweetener, small amounts of stevia may be best -sweetened beverages and artificially sweetened beverages -simple starches (rice, bread, potatoes, pasta, chips, etc - small amounts of 100% whole grains are ok) -red meat, pork, butter -fried foods, fast food, processed food, excessive dairy, eggs and coconut.  3)Get at least 150 minutes of sweaty aerobic exercise per week.  4)Reduce stress - consider counseling, meditation and relaxation to balance other aspects of your life.     [name], DO

## 2018-04-09 NOTE — Patient Instructions (Signed)
BEFORE YOU LEAVE: -flu shot -follow up: yearly for physical  -soaks twice daily, topical antibiotic ointment  -I hope you are feeling better soon! Seek care promptly if your symptoms worsen, new concerns arise or you are not improving with treatment.    -We placed a referral for you as discussed. It usually takes about 1-2 weeks to process and schedule this referral. If you have not heard from [place] regarding this appointment in 2 weeks please contact our office.   We recommend the following healthy lifestyle for LIFE: 1) Small portions. But, make sure to get regular (at least 3 per day), healthy meals and small healthy snacks if needed.  2) Eat a healthy clean diet.   TRY TO EAT: -at least 5-7 servings of low sugar, colorful, and nutrient rich vegetables per day (not corn, potatoes or bananas.) -berries are the best choice if you wish to eat fruit (only eat small amounts if trying to reduce weight)  -lean meets (fish, white meat of chicken or [place]) -vegan proteins for some meals - beans or tofu, whole grains, nuts and seeds -Replace bad fats with good fats - good fats include: fish, nuts and seeds, canola oil, olive oil -small amounts of low fat or non fat dairy -small amounts of100 % whole grains - check the lables -drink plenty of water  AVOID: -SUGAR, sweets, anything with added sugar, corn syrup or sweeteners - must read labels as even foods advertised as "healthy" often are loaded with sugar -if you must have a sweetener, small amounts of stevia may be best -sweetened beverages and artificially sweetened beverages -simple starches (rice, bread, potatoes, pasta, chips, etc - small amounts of 100% whole grains are ok) -red meat, pork, butter -fried foods, fast food, processed food, excessive dairy, eggs and coconut.  3)Get at least 150 minutes of sweaty aerobic exercise per week.  4)Reduce stress - consider counseling, meditation and relaxation to balance other aspects of  your life.

## 2018-04-09 NOTE — Addendum Note (Signed)
Addended by: Johnella Moloney on: 04/09/2018 10:02 AM   Modules accepted: Orders

## 2018-04-29 ENCOUNTER — Other Ambulatory Visit: Payer: Self-pay

## 2018-04-29 ENCOUNTER — Encounter: Payer: Self-pay | Admitting: Family Medicine

## 2018-04-29 ENCOUNTER — Ambulatory Visit: Payer: BLUE CROSS/BLUE SHIELD | Admitting: Family Medicine

## 2018-04-29 VITALS — BP 120/82 | HR 85 | Temp 98.6°F | Wt 138.1 lb

## 2018-04-29 DIAGNOSIS — J209 Acute bronchitis, unspecified: Secondary | ICD-10-CM

## 2018-04-29 MED ORDER — BENZONATATE 100 MG PO CAPS: 100.0000 mg | ORAL_CAPSULE | Freq: Two times a day (BID) | ORAL | 0 refills | Status: DC | PRN

## 2018-04-29 NOTE — Progress Notes (Signed)
  Subjective:     Patient ID: Kim Pacheco, female   DOB: 1986-01-24, 32 y.o.   MRN: [id number]  HPI Patient is an ex-smoker who was seen with onset last Thursday of mostly dry cough.  No fevers or chills.  No sore throat.  No history of asthma.  Denies any nausea, vomiting, or diarrhea.  No sick contacts.  No pleuritic pain.  No hemoptysis.  She has not tried any cough medications thus far.  She has 2117-month-old at home but is not breast-feeding.  Past Medical History:  Diagnosis Date  . Abnormal Pap smear    normal 06/2013  . Depression    suicide attempt in college  . Diabetes mellitus without complication (HCC)    gestational, no issues since pregnancy per her report  . Gestational diabetes    metformin  . Hepatitis    Hep B, managed by Dr. Kinnie ScalesMedoff  . History of chicken pox   . History of UTI   . Vaginal Pap smear, abnormal    Past Surgical History:  Procedure Laterality Date  . CESAREAN SECTION N/A 01/26/2013   Procedure: Primary Cesarean Section Delivery Baby  Boy @ 0017, Apgars 4/8/9;  Surgeon: Freddrick MarchKendra H. Tenny Crawoss, MD;  Location: [facility];  Service: Obstetrics;  Laterality: N/A;  . CESAREAN SECTION N/A 11/18/2017   Procedure: CESAREAN SECTION;  Surgeon: [name], Richard, MD;  [facility];  Service: Obstetrics;  Laterality: N/A;  . COLPOSCOPY    . NASAL SINUS SURGERY      reports that she has quit smoking. Her smoking use included cigarettes. She smoked 1.00 pack per day. She has never used smokeless tobacco. She reports that she does not drink alcohol or use drugs. family history is not on file. She was adopted. No Known Allergies   Review of Systems  Constitutional: Negative for chills and fever.  HENT: Negative for sore throat.   Respiratory: Positive for cough. Negative for shortness of breath and wheezing.        Objective:   Physical Exam  Constitutional: She appears well-developed and well-nourished.  HENT:  Right Ear: External ear normal.   Left Ear: External ear normal.  Mouth/Throat: Oropharynx is clear and moist.  Neck: Neck supple.  Cardiovascular: Normal rate and regular rhythm.  Pulmonary/Chest: Effort normal and breath sounds normal. She has no wheezes. She has no rales.  Lymphadenopathy:    She has no cervical adenopathy.       Assessment:     Cough.  Suspect acute viral bronchitis    Plan:     -Consider over-the-counter plain Mucinex -Tessalon Perles 100 mg every 8 hours as needed for cough -Follow-up promptly for any fever, increased shortness of breath, or any persistent cough  Kristian CoveyBruce W Burchette MD [facility]

## 2018-04-29 NOTE — Patient Instructions (Addendum)

## 2018-05-25 ENCOUNTER — Ambulatory Visit: Payer: BLUE CROSS/BLUE SHIELD | Admitting: Family Medicine

## 2018-05-25 ENCOUNTER — Other Ambulatory Visit: Payer: Self-pay | Admitting: Physician Assistant

## 2018-05-25 ENCOUNTER — Other Ambulatory Visit: Payer: Self-pay

## 2018-05-25 ENCOUNTER — Emergency Department (HOSPITAL_COMMUNITY)
Admission: EM | Admit: 2018-05-25 | Discharge: 2018-05-26 | Disposition: A | Discharge status: Home / Self Care | Payer: BLUE CROSS/BLUE SHIELD | Attending: Emergency Medicine | Admitting: Emergency Medicine

## 2018-05-25 ENCOUNTER — Encounter (HOSPITAL_COMMUNITY): Payer: Self-pay | Admitting: Emergency Medicine

## 2018-05-25 ENCOUNTER — Emergency Department (HOSPITAL_COMMUNITY): Payer: BLUE CROSS/BLUE SHIELD

## 2018-05-25 ENCOUNTER — Encounter: Payer: Self-pay | Admitting: Family Medicine

## 2018-05-25 ENCOUNTER — Ambulatory Visit: Payer: Self-pay

## 2018-05-25 VITALS — BP 118/60 | HR 96 | Temp 98.4°F | Wt 137.0 lb

## 2018-05-25 DIAGNOSIS — R109 Unspecified abdominal pain: Secondary | ICD-10-CM

## 2018-05-25 DIAGNOSIS — Z79899 Other long term (current) drug therapy: Secondary | ICD-10-CM | POA: Diagnosis not present

## 2018-05-25 DIAGNOSIS — R1084 Generalized abdominal pain: Secondary | ICD-10-CM | POA: Diagnosis not present

## 2018-05-25 DIAGNOSIS — Z113 Encounter for screening for infections with a predominantly sexual mode of transmission: Secondary | ICD-10-CM | POA: Insufficient documentation

## 2018-05-25 DIAGNOSIS — Z87891 Personal history of nicotine dependence: Secondary | ICD-10-CM | POA: Diagnosis not present

## 2018-05-25 DIAGNOSIS — N390 Urinary tract infection, site not specified: Secondary | ICD-10-CM | POA: Insufficient documentation

## 2018-05-25 DIAGNOSIS — N939 Abnormal uterine and vaginal bleeding, unspecified: Secondary | ICD-10-CM

## 2018-05-25 DIAGNOSIS — R1030 Lower abdominal pain, unspecified: Secondary | ICD-10-CM | POA: Diagnosis not present

## 2018-05-25 DIAGNOSIS — K760 Fatty (change of) liver, not elsewhere classified: Secondary | ICD-10-CM | POA: Diagnosis not present

## 2018-05-25 DIAGNOSIS — N938 Other specified abnormal uterine and vaginal bleeding: Secondary | ICD-10-CM | POA: Insufficient documentation

## 2018-05-25 DIAGNOSIS — E119 Type 2 diabetes mellitus without complications: Secondary | ICD-10-CM | POA: Diagnosis not present

## 2018-05-25 DIAGNOSIS — R0602 Shortness of breath: Secondary | ICD-10-CM | POA: Diagnosis not present

## 2018-05-25 LAB — CBC
HEMATOCRIT: 42.1 % (ref 36.0–46.0)
Hemoglobin: 13.9 g/dL (ref 12.0–15.0)
MCH: 30.1 pg (ref 26.0–34.0)
MCHC: 33 g/dL (ref 30.0–36.0)
MCV: 91.1 fL (ref 80.0–100.0)
Platelets: 224 10*3/uL (ref 150–400)
RBC: 4.62 MIL/uL (ref 3.87–5.11)
RDW: 12.2 % (ref 11.5–15.5)
WBC: 12 10*3/uL — ABNORMAL HIGH (ref 4.0–10.5)
nRBC: 0 % (ref 0.0–0.2)

## 2018-05-25 LAB — URINALYSIS, ROUTINE W REFLEX MICROSCOPIC
BILIRUBIN URINE: NEGATIVE
Glucose, UA: NEGATIVE mg/dL
KETONES UR: 20 mg/dL — AB
Nitrite: NEGATIVE
PROTEIN: NEGATIVE mg/dL
Specific Gravity, Urine: 1.019 (ref 1.005–1.030)
WBC, UA: >50 WBC/hpf — ABNORMAL HIGH (ref 0–5)
pH: 5 (ref 5.0–8.0)

## 2018-05-25 LAB — WET PREP, GENITAL
CLUE CELLS WET PREP: NONE SEEN
Sperm: NONE SEEN
Trich, Wet Prep: NONE SEEN
Yeast Wet Prep HPF POC: NONE SEEN

## 2018-05-25 LAB — COMPREHENSIVE METABOLIC PANEL
ALBUMIN: 4.2 g/dL (ref 3.5–5.0)
ALT: 29 U/L (ref 0–44)
AST: 27 U/L (ref 15–41)
Alkaline Phosphatase: 51 U/L (ref 38–126)
Anion gap: 9 (ref 5–15)
BUN: 13 mg/dL (ref 6–20)
CO2: 22 mmol/L (ref 22–32)
CREATININE: 0.65 mg/dL (ref 0.44–1.00)
Calcium: 8.7 mg/dL — ABNORMAL LOW (ref 8.9–10.3)
Chloride: 105 mmol/L (ref 98–111)
GFR calc Af Amer: >60 mL/min (ref >60)
GFR calc non Af Amer: >60 mL/min (ref >60)
Glucose, Bld: 122 mg/dL — ABNORMAL HIGH (ref 70–99)
Potassium: 4.4 mmol/L (ref 3.5–5.1)
SODIUM: 136 mmol/L (ref 135–145)
Total Bilirubin: 0.9 mg/dL (ref 0.3–1.2)
Total Protein: 7.8 g/dL (ref 6.5–8.1)

## 2018-05-25 LAB — I-STAT BETA HCG BLOOD, ED (MC, WL, AP ONLY)

## 2018-05-25 LAB — LIPASE, BLOOD: LIPASE: 31 U/L (ref 11–51)

## 2018-05-25 MED ORDER — KETOROLAC TROMETHAMINE 15 MG/ML IJ SOLN
30.0000 mg | Freq: Once | INTRAMUSCULAR | Status: AC
  Administered 2018-05-25: 30 mg via INTRAVENOUS
  Filled 2018-05-25: qty 2

## 2018-05-25 MED ORDER — PHENAZOPYRIDINE HCL 200 MG PO TABS
200.0000 mg | ORAL_TABLET | Freq: Three times a day (TID) | ORAL | Status: DC
  Administered 2018-05-25: 200 mg via ORAL
  Filled 2018-05-25: qty 1

## 2018-05-25 MED ORDER — SODIUM CHLORIDE 0.9 % IV BOLUS
1000.0000 mL | Freq: Once | INTRAVENOUS | Status: AC
  Administered 2018-05-25: 1000 mL via INTRAVENOUS

## 2018-05-25 MED ORDER — IOPAMIDOL (ISOVUE-300) INJECTION 61%
100.0000 mL | Freq: Once | INTRAVENOUS | Status: AC | PRN
  Administered 2018-05-25: 100 mL via INTRAVENOUS

## 2018-05-25 MED ORDER — FENTANYL CITRATE (PF) 100 MCG/2ML IJ SOLN
25.0000 ug | Freq: Once | INTRAMUSCULAR | Status: AC
  Administered 2018-05-25: 25 ug via INTRAVENOUS
  Filled 2018-05-25: qty 2

## 2018-05-25 MED ORDER — SODIUM CHLORIDE (PF) 0.9 % IJ SOLN
INTRAMUSCULAR | Status: AC
  Filled 2018-05-25: qty 50

## 2018-05-25 MED ORDER — CEPHALEXIN 500 MG PO CAPS: 500.0000 mg | ORAL_CAPSULE | Freq: Four times a day (QID) | ORAL | 0 refills | Status: AC

## 2018-05-25 MED ORDER — PHENAZOPYRIDINE HCL 95 MG PO TABS: 95.0000 mg | ORAL_TABLET | Freq: Three times a day (TID) | ORAL | 0 refills | Status: DC | PRN

## 2018-05-25 MED ORDER — IOPAMIDOL (ISOVUE-300) INJECTION 61%
INTRAVENOUS | Status: AC
  Filled 2018-05-25: qty 100

## 2018-05-25 NOTE — Telephone Encounter (Signed)
Sending to Dr [name] as Lorain ChildesFYI.   Pt seen today.

## 2018-05-25 NOTE — Progress Notes (Signed)
  Kim BowerMaria K Pacheco DOB: 05/09/1986 Encounter date: 05/25/2018  This is a 32 y.o. female who presents with Chief Complaint  Patient presents with  . Abdominal Pain    started this morning, across lower abdomin,     History of present illness:  Lots of pain in abdomen; woke up in pain. Feeling fine yesterday. Feels like she needs to poop. Was fatty in nature.   Pain is 7-8/10. No fevers. Urination normal and bowel movements normal.   Last period was 2 weeks ago. Takes birth control regularly. Not always good about taking medication.   Was unable to give urine sample due to pain. Unable to quantify vaginal bleeding at this point but states that it is not typical for her to have mid cycle bleeding.     No Known Allergies Current Meds  Medication Sig  . HEATHER 0.35 MG tablet   . sertraline (ZOLOFT) 50 MG tablet   . tenofovir (VIREAD) 300 MG tablet Take 300 mg by mouth daily.    Review of Systems  Constitutional: Negative for fever.  Respiratory: Negative for chest tightness and shortness of breath.   Cardiovascular: Negative for chest pain.  Gastrointestinal: Positive for abdominal pain. Negative for abdominal distention (uncertain), constipation, diarrhea and vomiting.  Genitourinary: Positive for decreased urine volume (states she just hasn't been drinking enough fluids), difficulty urinating (difficult now) and vaginal bleeding.  Musculoskeletal: Positive for back pain.    Objective:  BP 118/60 (BP Location: Left Arm, Patient Position: Sitting, Cuff Size: Normal)   Pulse 96   Temp 98.4 F (36.9 C) (Oral)   Wt 137 lb (62.1 kg)   LMP 05/18/2018   SpO2 97%   BMI 27.67 kg/m   Weight: 137 lb (62.1 kg)   BP Readings from Last 3 Encounters:  05/25/18 118/60  04/29/18 120/82  04/09/18 96/62   Wt Readings from Last 3 Encounters:  05/25/18 137 lb (62.1 kg)  04/29/18 138 lb 1.6 oz (62.6 kg)  04/09/18 135 lb 9.6 oz (61.5 kg)    Physical Exam Constitutional:      General: She is not in acute distress.    Appearance: She is well-developed.  Cardiovascular:     Rate and Rhythm: Normal rate and regular rhythm.     Heart sounds: Normal heart sounds. No murmur. No friction rub.     Comments: No lower extremity edema Pulmonary:     Effort: Pulmonary effort is normal. No respiratory distress.     Breath sounds: Normal breath sounds. No wheezing or rales.  Abdominal:     General: Bowel sounds are normal.     Tenderness: There is generalized abdominal tenderness.     Comments: Increase tenderness lower abdomen.   Neurological:     Mental Status: She is alert and oriented to person, place, and time.  Psychiatric:        Behavior: Behavior normal.     Assessment/Plan 1. Abdominal pain, unspecified abdominal location: Severe; patient writhing in pain in office, unable to walk due to pain. Unable to give urine sample. Needs further evaluation/imaging/bloodwork. R/o ectopic/other pregnancy.   2. Vaginal bleeding See above. Due to severity of pain and need for transportation to ER I did not complete a pelvic exam today.  Return SENDING TO ER.         Kim ShoveJunell Meranda Dechaine, MD

## 2018-05-25 NOTE — Telephone Encounter (Addendum)
Patient called in with c/o "abdominal pain, back pain, vaginal bleeding." She says "I woke up a 0600 and when I stood up, I had sharp lower abdominal pain around my belly button into my lower back. I also have vaginal bleeding and my period was on last week. The pain has been constant since it started at 5-6 pain level." I asked what has made the pain better or worse, she says "I had a fatty bowel movement this morning and that seemed to make the pain better. I also drank water and that helped as well." I asked about other symptoms, she says "just the vaginal bleeding like period blood, no urine problems, no fever, no vomiting, no nausea." According to protocol, see PCP within 4 hours, no availability today with Dr. [name], patient says she will see any provider, appointment scheduled for today at 1030 with Dr. Hassan RowanKoberlein, care advice given, patient verbalized understanding.  Reason for Disposition . [1] MILD-MODERATE pain AND [2] constant AND [3] present > 2 hours  Answer Assessment - Initial Assessment Questions 1. LOCATION: "Where does it hurt?"      Lower abdomen all the way across around belly button 2. RADIATION: "Does the pain shoot anywhere else?" (e.g., chest, back)     Into lower back 3. ONSET: "When did the pain begin?" (e.g., minutes, hours or days ago)      This morning at 6 am 4. SUDDEN: "Gradual or sudden onset?"     Sudden onset of sharp 5. PATTERN "Does the pain come and go, or is it constant?"    - If constant: "Is it getting better, staying the same, or worsening?"      (Note: Constant means the pain never goes away completely; most serious pain is constant and it progresses)     - If intermittent: "How long does it last?" "Do you have pain now?"     (Note: Intermittent means the pain goes away completely between bouts)     Constant 6. SEVERITY: "How bad is the pain?"  (e.g., Scale 1-10; mild, moderate, or severe)   - MILD (1-3): doesn't interfere with normal activities, abdomen  soft and not tender to touch    - MODERATE (4-7): interferes with normal activities or awakens from sleep, tender to touch    - SEVERE (8-10): excruciating pain, doubled over, unable to do any normal activities      5-6 7. RECURRENT SYMPTOM: "Have you ever had this type of abdominal pain before?" If so, ask: "When was the last time?" and "What happened that time?"      No 8. CAUSE: "What do you think is causing the abdominal pain?"     I don't know 9. RELIEVING/AGGRAVATING FACTORS: "What makes it better or worse?" (e.g., movement, antacids, bowel movement)     Better having a bowel movement, drinking water; nothing made it worse 10. OTHER SYMPTOMS: "Has there been any vomiting, diarrhea, constipation, or urine problems?"       Vaginal bleeding like period blood 11. PREGNANCY: "Is there any chance you are pregnant?" "When was your last menstrual period?"       No; LMP last week  Protocols used: ABDOMINAL PAIN - [facility]

## 2018-05-25 NOTE — ED Provider Notes (Signed)
[facility]-EMERGENCY DEPT Provider Note   CSN: [id number] Arrival date & time: 05/25/18  1139     History   Chief Complaint Chief Complaint  Patient presents with  . Abdominal Pain  . Vaginal Bleeding    HPI Kim Pacheco is a 32 y.o. female with a past medical history of recurrent UTIs who presents to ED for 12-hour history of lower abdominal pain, pain with urination, hematuria.  States that she had a "fatty" bowel movement this morning as well as nausea.  Since then she feels like "I have constipation."  She reports that sometimes with her UTI she will get similar urinary symptoms but "not this bad ever."  She reports severe pain with urination since then.  At times the abdominal pain radiated to her back this morning.  Denies any history of kidney stones.  Prior abdominal surgeries include C-section x2 in the past.  Denies any sick contacts, recent travel, suspicious food ingestions, fever, injuries or falls, abnormal vaginal bleeding, vaginal discharge, concern for STDs.  HPI  Past Medical History:  Diagnosis Date  . Abnormal Pap smear    normal 06/2013  . Depression    suicide attempt in college  . Diabetes mellitus without complication (HCC)    gestational, no issues since pregnancy per her report  . Gestational diabetes    metformin  . Hepatitis    Hep B, managed by Dr. Kinnie Scales  . History of chicken pox   . History of UTI   . Vaginal Pap smear, abnormal     Patient Active Problem List   Diagnosis Date Noted  . Encounter for planned induction of labor 11/18/2017  . Oral hypoglycemic controlled White classification A2 gestational diabetes mellitus (GDM) 11/18/2017  . Postpartum care following cesarean delivery (6/11) 11/18/2017  . Previous cesarean delivery, delivered: arrest of dilation  11/18/2017  . Abnormal glucose tolerance test (GTT) during pregnancy, antepartum 09/17/2017  . Hepatitis B 06/14/2014    Past Surgical History:    Procedure Laterality Date  . CESAREAN SECTION N/A 01/26/2013   Procedure: Primary Cesarean Section Delivery Baby  Boy @ 0017, Apgars 4/8/9;  Surgeon: Freddrick March. Tenny Craw, MD;  Location: [facility];  Service: Obstetrics;  Laterality: N/A;  . CESAREAN SECTION N/A 11/18/2017   Procedure: CESAREAN SECTION;  Surgeon: Olivia Mackie, MD;  Location: [facility] BIRTHING SUIT[facility];  Service: Obstetrics;  Laterality: N/A;  . COLPOSCOPY    . NASAL SINUS SURGERY       OB History    Gravida  7   Para  2   Term  2   Preterm      AB  5   Living  2     SAB  0   TAB  5   Ectopic      Multiple  0   Live Births  2            Home Medications    Prior to Admission medications   Medication Sig Start Date End Date Taking? Authorizing Provider  HEATHER 0.35 MG tablet Take 1 tablet by mouth daily.  03/29/18  Yes [provider]  ibuprofen (ADVIL,MOTRIN) 200 MG tablet Take 400 mg by mouth daily as needed for fever or moderate pain.   Yes [provider]  sertraline (ZOLOFT) 50 MG tablet Take 50 mg by mouth daily.  01/19/18  Yes [provider]  tenofovir (VIREAD) 300 MG tablet Take 300 mg by mouth daily.   Yes [provider]  cephALEXin (KEFLEX) 500 MG capsule Take 1 capsule (500 mg total) by mouth 4 (four) times daily for 7 days. 05/25/18 06/01/18  Dietrich Pates, PA-C  phenazopyridine (PYRIDIUM) 95 MG tablet Take 1 tablet (95 mg total) by mouth 3 (three) times daily as needed for pain. 05/25/18   Dietrich Pates, PA-C    Family History Family History  Adopted: Yes    Social History Social History   Tobacco Use  . Smoking status: Former Smoker    Packs/day: 1.00    Types: Cigarettes  . Smokeless tobacco: Never Used  . Tobacco comment: smoked for 8 years, quit in 2013  Substance Use Topics  . Alcohol use: Yes    Alcohol/week: 28.0 standard drinks    Types: 28 Glasses of wine per week    Comment: 4 glasses wine/day   . Drug use: No     Allergies   Patient  has no known allergies.   Review of Systems Review of Systems  Constitutional: Negative for appetite change, chills and fever.  HENT: Negative for ear pain, rhinorrhea, sneezing and sore throat.   Eyes: Negative for photophobia and visual disturbance.  Respiratory: Negative for cough, chest tightness, shortness of breath and wheezing.   Cardiovascular: Negative for chest pain and palpitations.  Gastrointestinal: Positive for abdominal pain, constipation and nausea. Negative for blood in stool, diarrhea and vomiting.  Genitourinary: Negative for dysuria, hematuria and urgency.  Musculoskeletal: Positive for back pain. Negative for myalgias.  Skin: Negative for rash.  Neurological: Negative for dizziness, weakness and light-headedness.     Physical Exam Updated Vital Signs BP 114/73   Pulse 65   Temp 98.7 F (37.1 C) (Oral)   Resp 14   Ht 5' (1.524 m)   Wt 62.1 kg   LMP 05/18/2018   SpO2 99%   BMI 26.76 kg/m   Physical Exam Vitals signs and nursing note reviewed.  Constitutional:      General: She is not in acute distress.    Appearance: She is well-developed.  HENT:     Head: Normocephalic and atraumatic.     Nose: Nose normal.  Eyes:     General: No scleral icterus.       Left eye: No discharge.     Conjunctiva/sclera: Conjunctivae normal.  Neck:     Musculoskeletal: Normal range of motion and neck supple.  Cardiovascular:     Rate and Rhythm: Normal rate and regular rhythm.     Heart sounds: Normal heart sounds. No murmur. No friction rub. No gallop.   Pulmonary:     Effort: Pulmonary effort is normal. No respiratory distress.     Breath sounds: Normal breath sounds.  Abdominal:     General: Bowel sounds are normal. There is no distension.     Palpations: Abdomen is soft.     Tenderness: There is abdominal tenderness in the right lower quadrant, suprapubic area and left lower quadrant. There is no right CVA tenderness, left CVA tenderness or guarding.    Genitourinary:    Vagina: Bleeding present.     Uterus: Not tender and no uterine prolapse.      Adnexa:        Left: No tenderness.       Comments: Pelvic exam: normal external genitalia without evidence of trauma. VULVA: normal appearing vulva with no masses, tenderness or lesion. VAGINA: normal appearing vagina with normal color and discharge, no lesions. CERVIX: normal appearing cervix without lesions, cervical motion tenderness absent, cervical  os closed with out purulent discharge; No vaginal discharge. Small amount of blood seen in vaginal vault. Wet prep and DNA probe for chlamydia and GC obtained.   ADNEXA: normal adnexa in size, nontender and no masses UTERUS: uterus is normal size, shape, consistency and nontender.   Musculoskeletal: Normal range of motion.  Skin:    General: Skin is warm and dry.     Findings: No rash.  Neurological:     Mental Status: She is alert.     Motor: No abnormal muscle tone.     Coordination: Coordination normal.      ED Treatments / Results  Labs (all labs ordered are listed, but only abnormal results are displayed) Labs Reviewed  WET PREP, GENITAL - Abnormal; Notable for the following components:      Result Value   WBC, Wet Prep HPF POC FEW (*)    All other components within normal limits  COMPREHENSIVE METABOLIC PANEL - Abnormal; Notable for the following components:   Glucose, Bld 122 (*)    Calcium 8.7 (*)    All other components within normal limits  CBC - Abnormal; Notable for the following components:   WBC 12.0 (*)    All other components within normal limits  URINALYSIS, ROUTINE W REFLEX MICROSCOPIC - Abnormal; Notable for the following components:   APPearance HAZY (*)    Hgb urine dipstick LARGE (*)    Ketones, ur 20 (*)    Leukocytes, UA LARGE (*)    WBC, UA >50 (*)    Bacteria, UA MANY (*)    All other components within normal limits  LIPASE, BLOOD  I-STAT BETA HCG BLOOD, ED ([facility], [facility], [facility] ONLY)  GC/CHLAMYDIA PROBE  AMP ([place]) NOT AT [facility]    EKG None  Radiology Ct Abdomen Pelvis W Contrast  Result Date: 05/25/2018 CLINICAL DATA:  Lower abdominal pain since this morning. EXAM: CT ABDOMEN AND PELVIS WITH CONTRAST TECHNIQUE: Multidetector CT imaging of the abdomen and pelvis was performed using the standard protocol following bolus administration of intravenous contrast. CONTRAST:  ISOVUE-300 IOPAMIDOL (ISOVUE-300) INJECTION 61% COMPARISON:  Abdomen ultrasound dated 03/18/2018. FINDINGS: Lower chest: Clear lung bases. Hepatobiliary: Diffuse low density of the liver relative to the spleen. Normal appearing gallbladder. Pancreas: Unremarkable. No pancreatic ductal dilatation or surrounding inflammatory changes. Spleen: Normal in size without focal abnormality. Adrenals/Urinary Tract: Adrenal glands are unremarkable. Kidneys are normal, without renal calculi, focal lesion, or hydronephrosis. Bladder is unremarkable. Stomach/Bowel: Stomach is within normal limits. Appendix appears normal. No evidence of bowel wall thickening, distention, or inflammatory changes. Vascular/Lymphatic: No significant vascular findings are present. No enlarged abdominal or pelvic lymph nodes. Reproductive: Uterus and bilateral adnexa are unremarkable. Other: Small amount of free peritoneal fluid in the pelvic cul-de-sac, within normal limits of physiological fluid. Musculoskeletal: Normal appearing bones. IMPRESSION: 1. No acute abnormality. 2. Diffuse hepatic steatosis. Electronically Signed   By: Beckie Salts M.D.   On: 05/25/2018 21:12    Procedures Procedures (including critical care time)  Medications Ordered in ED Medications  iopamidol (ISOVUE-300) 61 % injection (has no administration in time range)  sodium chloride (PF) 0.9 % injection (has no administration in time range)  phenazopyridine (PYRIDIUM) tablet 200 mg (200 mg Oral Given 05/25/18 2220)  sodium chloride 0.9 % bolus 1,000 mL (1,000 mLs Intravenous New  Bag/Given 05/25/18 2026)  fentaNYL (SUBLIMAZE) injection 25 mcg (25 mcg Intravenous Given 05/25/18 1957)  fentaNYL (SUBLIMAZE) injection 25 mcg (25 mcg Intravenous Given 05/25/18 2101)  iopamidol (ISOVUE-300) 61 % injection 100 mL (100 mLs Intravenous Contrast Given 05/25/18 2040)  ketorolac (TORADOL) 15 MG/ML injection 30 mg (30 mg Intravenous Given 05/25/18 2221)     Initial Impression / Assessment and Plan / ED Course  I have reviewed the triage vital signs and the nursing notes.  Pertinent labs & imaging results that were available during my care of the patient were reviewed by me and considered in my medical decision making (see chart for details).     32 year old female with a past medical history of recurrent UTIs presents to ED for 12-hour history of lower abdominal pain, pain with urination and hematuria.  States that she had a fatty bowel movement this morning and then has discomfort with urination.  She does report history of similar symptoms in the past when she has a UTI but "not this bad ever."  Denies any sick contacts, fever, vaginal discharge or concern for STDs.  On exam patient has lower abdominal tenderness palpation without rebound or guarding noted.  Pelvic exam revealed small amount of blood in the vaginal vault.  Her bleeding began today.  Lab work significant for leukocytosis of 12, CBC otherwise unremarkable with no signs of anemia.  CMP is unremarkable.  Urinalysis shows large leukocytes, many bacteria and pyuria.  CT of the pelvis is negative for acute abnormality.  Wet prep shows WBCs with no other concerning findings.  GC chlamydia pending.  Suspect that her symptoms are due to cystitis.  Will treat with antibiotics and advised her to follow-up with her PCP, as well as [facility] and her OB/GYN for her abnormal bleeding.  She is comfortable here with medications and fluids given.  Patient is hemodynamically stable, in NAD, and able to ambulate in the ED.  Evaluation does not show pathology that would require ongoing emergent intervention or inpatient treatment. I explained the diagnosis to the patient. Pain has been managed and has no complaints prior to discharge. Patient is comfortable with above plan and is stable for discharge at this time. All questions were answered prior to disposition. Strict return precautions for returning to the ED were discussed. Encouraged follow up with PCP.    Portions of this note were generated with Scientist, clinical (histocompatibility and immunogenetics)Dragon dictation software. Dictation errors may occur despite best attempts at proofreading.   Final Clinical Impressions(s) / ED Diagnoses   Final diagnoses:  Lower urinary tract infectious disease  Abnormal vaginal bleeding    ED Discharge Orders         Ordered    cephALEXin (KEFLEX) 500 MG capsule  4 times daily     05/25/18 2357    phenazopyridine (PYRIDIUM) 95 MG tablet  3 times daily PRN     05/25/18 2357           Dietrich PatesKhatri, Deon Duer, PA-C 05/25/18 2359    Benjiman CorePickering, Nathan, MD 05/26/18 0008

## 2018-05-25 NOTE — Discharge Instructions (Signed)
Take your antibiotics as directed. Return to ED for worsening symptoms, fever, increased bleeding, lightheadedness or loss of consciousness.

## 2018-05-25 NOTE — ED Triage Notes (Signed)
Pt adds this morning started having vaginal bleeding. Reports she had her normal cycle 2 weeks ago.

## 2018-05-25 NOTE — ED Triage Notes (Signed)
[facility] pains that started this morning when woke up. Denies n/v/d or urinary problems. Vitals: 136/74, 50HR, 100% on RA, 16R.

## 2018-05-25 NOTE — ED Notes (Signed)
Assisted pt to triage restroom via wheelchair from lobby. Pt crying and states that hurts to bad to urinate. This RN had to pull patient's pants back up, as patient in too much pain to pull them back up herself.  Pt wheeled back to lobby and assisted to bench beside husband where she laid down.

## 2018-05-26 LAB — GC/CHLAMYDIA PROBE AMP (~~LOC~~) NOT AT ARMC
Chlamydia: NEGATIVE
Neisseria Gonorrhea: NEGATIVE

## 2018-05-26 LAB — CERVICOVAGINAL ANCILLARY ONLY
Chlamydia: NEGATIVE
Neisseria Gonorrhea: NEGATIVE

## 2018-05-26 MED ORDER — CEPHALEXIN 500 MG PO CAPS
500.0000 mg | ORAL_CAPSULE | Freq: Once | ORAL | Status: AC
  Administered 2018-05-26: 500 mg via ORAL
  Filled 2018-05-26: qty 1

## 2018-05-26 NOTE — ED Notes (Signed)
Pt bladder scan .

## 2018-05-29 ENCOUNTER — Ambulatory Visit: Payer: BLUE CROSS/BLUE SHIELD | Admitting: Podiatry

## 2018-05-29 ENCOUNTER — Encounter: Payer: Self-pay | Admitting: Podiatry

## 2018-05-29 VITALS — BP 106/66 | HR 71 | Resp 16

## 2018-05-29 DIAGNOSIS — R519 Headache, unspecified: Secondary | ICD-10-CM | POA: Insufficient documentation

## 2018-05-29 DIAGNOSIS — N921 Excessive and frequent menstruation with irregular cycle: Secondary | ICD-10-CM

## 2018-05-29 DIAGNOSIS — IMO0002 Reserved for concepts with insufficient information to code with codable children: Secondary | ICD-10-CM | POA: Insufficient documentation

## 2018-05-29 DIAGNOSIS — N390 Urinary tract infection, site not specified: Secondary | ICD-10-CM | POA: Insufficient documentation

## 2018-05-29 DIAGNOSIS — L600 Ingrowing nail: Secondary | ICD-10-CM

## 2018-05-29 DIAGNOSIS — N61 Mastitis without abscess: Secondary | ICD-10-CM

## 2018-05-29 DIAGNOSIS — N926 Irregular menstruation, unspecified: Secondary | ICD-10-CM

## 2018-05-29 DIAGNOSIS — E663 Overweight: Secondary | ICD-10-CM | POA: Insufficient documentation

## 2018-05-29 DIAGNOSIS — R51 Headache: Secondary | ICD-10-CM

## 2018-05-29 DIAGNOSIS — N6019 Diffuse cystic mastopathy of unspecified breast: Secondary | ICD-10-CM | POA: Insufficient documentation

## 2018-05-29 HISTORY — DX: Excessive and frequent menstruation with irregular cycle: N92.1

## 2018-05-29 HISTORY — DX: Mastitis without abscess: N61.0

## 2018-05-29 NOTE — Patient Instructions (Signed)

## 2018-05-31 NOTE — Progress Notes (Signed)
Subjective:   Patient ID: Kim Pacheco, female   DOB: 32 y.o.   MRN: [id number]   HPI 32 year old female presents the office today for concerns of chronic ingrown toenails along both of the big toes, plantar medial aspect.  Is been on the for 2 years is been intermittent but she states is been getting worse.  She said the area is painful with pressure in shoes.  She denies any drainage or pus.  She is tried trimming and soaking with any significant improvement and they continue to be painful and she wants to proceed with having the procedure done to get into the ingrown toenails as she has done research about this.   Review of Systems  All other systems reviewed and are negative.  Past Medical History:  Diagnosis Date  . Abnormal Pap smear    normal 06/2013  . Depression    suicide attempt in college  . Diabetes mellitus without complication (HCC)    gestational, no issues since pregnancy per her report  . Gestational diabetes    metformin  . Hepatitis    Hep B, managed by Dr. Kinnie ScalesMedoff  . History of chicken pox   . History of UTI   . Vaginal Pap smear, abnormal     Past Surgical History:  Procedure Laterality Date  . CESAREAN SECTION N/A 01/26/2013   Procedure: Primary Cesarean Section Delivery Baby  Boy @ 0017, Apgars 4/8/9;  Surgeon: Freddrick MarchKendra H. Tenny Crawoss, MD;  Location: [facility];  Service: Obstetrics;  Laterality: N/A;  . CESAREAN SECTION N/A 11/18/2017   Procedure: CESAREAN SECTION;  Surgeon: [name], Richard, MD;  [facility];  Service: Obstetrics;  Laterality: N/A;  . COLPOSCOPY    . NASAL SINUS SURGERY       Current Outpatient Medications:  .  norethindrone (HEATHER) 0.35 MG tablet, Take 1 tablet by mouth daily., Disp: , Rfl:  .  cephALEXin (KEFLEX) 500 MG capsule, Take 1 capsule (500 mg total) by mouth 4 (four) times daily for 7 days., Disp: 28 capsule, Rfl: 0 .  sertraline (ZOLOFT) 50 MG tablet, Take 50 mg by mouth daily. , Disp: , Rfl: 1 .  tenofovir  (VIREAD) 300 MG tablet, Take 300 mg by mouth daily., Disp: , Rfl:   No Known Allergies      Objective:  Physical Exam  General: AAO x3, NAD  Dermatological: Incurvation present along the medial aspect of bilateral hallux toenails with tenderness palpation there is localized edema but there is no significant erythema there is no drainage or pus there is no ascending cellulitis.  Vascular: Dorsalis Pedis artery and Posterior Tibial artery pedal pulses are 2/4 bilateral with immedate capillary fill time.  There is no pain with calf compression, swelling, warmth, erythema.   Neruologic: Grossly intact via light touch bilateral.  Protective threshold with Semmes Wienstein monofilament intact to all pedal sites bilateral.   Musculoskeletal: Tenderness to the bilateral medial hallux nail borders but no other areas of tenderness.  Muscular strength 5/5 in all groups tested bilateral.  Gait: Unassisted, Nonantalgic.       Assessment:   Bilateral hallux ingrown toenails, medial aspect     Plan:  -Treatment options discussed including all alternatives, risks, and complications -Etiology of symptoms were discussed -At this time, the patient is requesting partial nail removal with chemical matricectomy to the symptomatic portion of the nail. Risks and complications were discussed with the patient for which they understand and written consent was obtained. Under sterile conditions  a total of 3 mL of a mixture of 2% lidocaine plain and 0.5% Marcaine plain was infiltrated in a hallux block fashion. Once anesthetized, the skin was prepped in sterile fashion. A tourniquet was then applied. Next the medial aspect of hallux nail border was then sharply excised making sure to remove the entire offending nail border. Once the nails were ensured to be removed area was debrided and the underlying skin was intact. There is no purulence identified in the procedure. Next phenol was then applied under standard  conditions and copiously irrigated. Silvadene was applied. A dry sterile dressing was applied. After application of the dressing the tourniquet was removed and there is found to be an immediate capillary refill time to the digit. The patient tolerated the procedure well any complications. Post procedure instructions were discussed the patient for which he verbally understood. Follow-up in one week for nail check or sooner if any problems are to arise. Discussed signs/symptoms of infection and directed to call the office immediately should any occur or go directly to the emergency room. In the meantime, encouraged to call the office with any questions, concerns, changes symptoms. -She is on Keflex for UTI right now.  We will continue with this.  Vivi BarrackMatthew R Tamkia Temples DPM

## 2018-06-05 ENCOUNTER — Ambulatory Visit: Payer: BLUE CROSS/BLUE SHIELD | Admitting: Podiatry

## 2018-06-05 ENCOUNTER — Encounter: Payer: Self-pay | Admitting: Podiatry

## 2018-06-05 DIAGNOSIS — L600 Ingrowing nail: Secondary | ICD-10-CM

## 2018-06-05 DIAGNOSIS — Z9889 Other specified postprocedural states: Secondary | ICD-10-CM

## 2018-06-05 NOTE — Patient Instructions (Signed)

## 2018-06-09 NOTE — Progress Notes (Signed)
Subjective: Kim Pacheco is a 32 y.o.  female returns to office today for follow up evaluation after having bilatearl Hallux partial permanent nail avulsion performed. Patient has been soaking using epsom salts and applying topical antibiotic covered with bandaid daily.  She states they are feeling better and she denies any drainage or pus or any redness coming from the area.  She did hit the right big toe oil which seemed to make the pain a bit worse but overall she is doing well.  Patient denies fevers, chills, nausea, vomiting. Denies any calf pain, chest pain, SOB.   Objective:  Vitals: Reviewed  General: Well developed, nourished, in no acute distress, alert and oriented x3   Dermatology: Skin is warm, dry and supple bilateral.  Lateral hallux nail border appears to be clean, dry, with mild granular tissue and surrounding scab. There is no surrounding erythema, edema, drainage/purulence. The remaining nails appear unremarkable at this time. There are no other lesions or other signs of infection present.  Neurovascular status: Intact. No lower extremity swelling; No pain with calf compression bilateral.  Musculoskeletal: Decreased tenderness to palpation of the medial hallux nail fold. Muscular strength within normal limits bilateral.   Assesement and Plan: S/p partial nail avulsion, doing well.   -Continue soaking in epsom salts twice a day followed by antibiotic ointment and a band-aid. Can leave uncovered at night. Continue this until completely healed.  -If the area has not healed in 2 weeks, call the office for follow-up appointment, or sooner if any problems arise.  -Monitor for any signs/symptoms of infection. Call the office immediately if any occur or go directly to the emergency room. Call with any questions/concerns.  Kim Pacheco, DPM

## 2018-06-10 HISTORY — PX: OTHER SURGICAL HISTORY: SHX169

## 2018-06-10 HISTORY — PX: COLONOSCOPY: SHX174

## 2018-06-23 ENCOUNTER — Encounter (HOSPITAL_COMMUNITY): Payer: Self-pay

## 2018-06-23 ENCOUNTER — Emergency Department (HOSPITAL_COMMUNITY): Payer: BLUE CROSS/BLUE SHIELD

## 2018-06-23 ENCOUNTER — Emergency Department (HOSPITAL_COMMUNITY)
Admission: EM | Admit: 2018-06-23 | Discharge: 2018-06-23 | Disposition: A | Discharge status: Home / Self Care | Payer: BLUE CROSS/BLUE SHIELD | Attending: Emergency Medicine | Admitting: Emergency Medicine

## 2018-06-23 DIAGNOSIS — R1084 Generalized abdominal pain: Secondary | ICD-10-CM | POA: Insufficient documentation

## 2018-06-23 DIAGNOSIS — Z79899 Other long term (current) drug therapy: Secondary | ICD-10-CM | POA: Diagnosis not present

## 2018-06-23 DIAGNOSIS — Z87891 Personal history of nicotine dependence: Secondary | ICD-10-CM | POA: Insufficient documentation

## 2018-06-23 DIAGNOSIS — E119 Type 2 diabetes mellitus without complications: Secondary | ICD-10-CM | POA: Diagnosis not present

## 2018-06-23 DIAGNOSIS — R188 Other ascites: Secondary | ICD-10-CM | POA: Insufficient documentation

## 2018-06-23 DIAGNOSIS — K760 Fatty (change of) liver, not elsewhere classified: Secondary | ICD-10-CM | POA: Diagnosis not present

## 2018-06-23 LAB — CBC WITH DIFFERENTIAL/PLATELET
Abs Immature Granulocytes: 0.06 10*3/uL (ref 0.00–0.07)
Basophils Absolute: 0.1 10*3/uL (ref 0.0–0.1)
Basophils Relative: 1 %
Eosinophils Absolute: 0 10*3/uL (ref 0.0–0.5)
Eosinophils Relative: 1 %
HCT: 45.2 % (ref 36.0–46.0)
Hemoglobin: 14.9 g/dL (ref 12.0–15.0)
Immature Granulocytes: 1 %
Lymphocytes Relative: 16 %
Lymphs Abs: 1.4 10*3/uL (ref 0.7–4.0)
MCH: 30.6 pg (ref 26.0–34.0)
MCHC: 33 g/dL (ref 30.0–36.0)
MCV: 92.8 fL (ref 80.0–100.0)
Monocytes Absolute: 0.5 10*3/uL (ref 0.1–1.0)
Monocytes Relative: 6 %
Neutro Abs: 6.6 10*3/uL (ref 1.7–7.7)
Neutrophils Relative %: 75 %
Platelets: 212 10*3/uL (ref 150–400)
RBC: 4.87 MIL/uL (ref 3.87–5.11)
RDW: 12.6 % (ref 11.5–15.5)
WBC: 8.7 10*3/uL (ref 4.0–10.5)
nRBC: 0 % (ref 0.0–0.2)

## 2018-06-23 LAB — URINALYSIS, ROUTINE W REFLEX MICROSCOPIC
Bacteria, UA: NONE SEEN
Bilirubin Urine: NEGATIVE
Glucose, UA: NEGATIVE mg/dL
Hgb urine dipstick: NEGATIVE
Ketones, ur: NEGATIVE mg/dL
Leukocytes, UA: NEGATIVE
Nitrite: POSITIVE — AB
Protein, ur: NEGATIVE mg/dL
Specific Gravity, Urine: 1.014 (ref 1.005–1.030)
pH: 5 (ref 5.0–8.0)

## 2018-06-23 LAB — I-STAT BETA HCG BLOOD, ED (MC, WL, AP ONLY): I-stat hCG, quantitative: <5 m[IU]/mL (ref <5)

## 2018-06-23 LAB — COMPREHENSIVE METABOLIC PANEL WITH GFR
ALT: 33 U/L (ref 0–44)
AST: 26 U/L (ref 15–41)
Albumin: 4.9 g/dL (ref 3.5–5.0)
Alkaline Phosphatase: 63 U/L (ref 38–126)
Anion gap: 11 (ref 5–15)
BUN: 17 mg/dL (ref 6–20)
CO2: 23 mmol/L (ref 22–32)
Calcium: 9.5 mg/dL (ref 8.9–10.3)
Chloride: 100 mmol/L (ref 98–111)
Creatinine, Ser: 1.12 mg/dL — ABNORMAL HIGH (ref 0.44–1.00)
GFR calc Af Amer: >60 mL/min
GFR calc non Af Amer: >60 mL/min
Glucose, Bld: 134 mg/dL — ABNORMAL HIGH (ref 70–99)
Potassium: 4.2 mmol/L (ref 3.5–5.1)
Sodium: 134 mmol/L — ABNORMAL LOW (ref 135–145)
Total Bilirubin: 0.9 mg/dL (ref 0.3–1.2)
Total Protein: 8.8 g/dL — ABNORMAL HIGH (ref 6.5–8.1)

## 2018-06-23 LAB — LIPASE, BLOOD: Lipase: 39 U/L (ref 11–51)

## 2018-06-23 MED ORDER — DIPHENHYDRAMINE HCL 50 MG/ML IJ SOLN
25.0000 mg | Freq: Once | INTRAMUSCULAR | Status: AC
  Administered 2018-06-23: 25 mg via INTRAVENOUS
  Filled 2018-06-23: qty 1

## 2018-06-23 MED ORDER — MORPHINE SULFATE (PF) 4 MG/ML IV SOLN
4.0000 mg | Freq: Once | INTRAVENOUS | Status: AC
  Administered 2018-06-23: 4 mg via INTRAVENOUS
  Filled 2018-06-23: qty 1

## 2018-06-23 MED ORDER — ACETAMINOPHEN 500 MG PO TABS
1000.0000 mg | ORAL_TABLET | Freq: Once | ORAL | Status: AC
  Administered 2018-06-23: 1000 mg via ORAL
  Filled 2018-06-23: qty 2

## 2018-06-23 MED ORDER — IOPAMIDOL (ISOVUE-300) INJECTION 61%
100.0000 mL | Freq: Once | INTRAVENOUS | Status: AC | PRN
  Administered 2018-06-23: 100 mL via INTRAVENOUS

## 2018-06-23 MED ORDER — SODIUM CHLORIDE 0.9 % IV BOLUS
1000.0000 mL | Freq: Once | INTRAVENOUS | Status: AC
  Administered 2018-06-23: 1000 mL via INTRAVENOUS

## 2018-06-23 MED ORDER — SODIUM CHLORIDE (PF) 0.9 % IJ SOLN
INTRAMUSCULAR | Status: AC
  Filled 2018-06-23: qty 50

## 2018-06-23 MED ORDER — LORAZEPAM 1 MG PO TABS
1.0000 mg | ORAL_TABLET | Freq: Once | ORAL | Status: AC
  Administered 2018-06-23: 1 mg via ORAL
  Filled 2018-06-23: qty 1

## 2018-06-23 MED ORDER — DICYCLOMINE HCL 20 MG PO TABS: 20.0000 mg | ORAL_TABLET | Freq: Three times a day (TID) | ORAL | 0 refills | Status: DC

## 2018-06-23 MED ORDER — ALBUTEROL SULFATE (2.5 MG/3ML) 0.083% IN NEBU
2.5000 mg | INHALATION_SOLUTION | Freq: Once | RESPIRATORY_TRACT | Status: AC
  Administered 2018-06-23: 2.5 mg via RESPIRATORY_TRACT
  Filled 2018-06-23: qty 3

## 2018-06-23 MED ORDER — OXYCODONE HCL 5 MG PO TABS: 5.0000 mg | ORAL_TABLET | Freq: Four times a day (QID) | ORAL | 0 refills | Status: DC | PRN

## 2018-06-23 MED ORDER — ONDANSETRON HCL 4 MG/2ML IJ SOLN
4.0000 mg | Freq: Once | INTRAMUSCULAR | Status: AC
  Administered 2018-06-23: 4 mg via INTRAVENOUS
  Filled 2018-06-23: qty 2

## 2018-06-23 NOTE — Discharge Instructions (Addendum)
Today you received medications that may make you sleepy or impair your ability to make decisions.  For the next 24 hours please do not drive, operate heavy machinery, care for a small child with out another adult present, or perform any activities that may cause harm to you or someone else if you were to fall asleep or be impaired.   You are being prescribed a medication which may make you sleepy. Please follow up of listed precautions for at least 24 hours after taking one dose.  Please avoid acetaminophen (Tylenol) and alcohol.

## 2018-06-23 NOTE — ED Provider Notes (Signed)
[facility]-EMERGENCY DEPT Provider Note   CSN: [id number] Arrival date & time: 06/23/18  0741     History   Chief Complaint Chief Complaint  Patient presents with  . Abdominal Pain    HPI Juluis MireMaria K Sollecito-Olson is a 33 y.o. female.  HPI  Juluis MireMaria K Sollecito-Olson is a 33 y.o. female with a history of hepatitis B, depression, and prior gestational diabetes, who presents to the emergency department for evaluation of generalized abdominal pain.  Patient reports pain started acutely this morning.  At baseline is about a 1-2/10 but intermittently becomes severe and 10/10 pain, the pain does not localize to one specific area.  She reports she was seen for similar symptoms about a month ago and diagnosed with a UTI, although patient reports she is not currently experiencing any dysuria or urinary frequency she has not noticed any blood in her urine and denies any focal flank pain.  Did not have these symptoms either when she was seen previously.  She denies any associated fevers or chills, no nausea or vomiting, no diarrhea, melena or hematochezia.  Denies any vaginal discharge or bleeding, is married and in a monogamous relationship had STD testing 1 month ago with similar presentation.  Denies any associated chest pain or shortness of breath, no cough.  She has not taken anything for pain prior to arrival.   Past Medical History:  Diagnosis Date  . Abnormal Pap smear    normal 06/2013  . Depression    suicide attempt in college  . Diabetes mellitus without complication (HCC)    gestational, no issues since pregnancy per her report  . Gestational diabetes    metformin  . Hepatitis    Hep B, managed by Dr. Kinnie ScalesMedoff  . History of chicken pox   . History of UTI   . Vaginal Pap smear, abnormal     Patient Active Problem List   Diagnosis Date Noted  . Body mass index (BMI) of 25.0 to 29.9 05/29/2018  . Cellulitis of breast 05/29/2018  . Fibrocystic disease of breast  05/29/2018  . Headache 05/29/2018  . Menstrual period late 05/29/2018  . Metrorrhagia 05/29/2018  . Postpartum state 05/29/2018  . Urinary tract infectious disease 05/29/2018  . Encounter for planned induction of labor 11/18/2017  . Oral hypoglycemic controlled White classification A2 gestational diabetes mellitus (GDM) 11/18/2017  . Postpartum care following cesarean delivery (6/11) 11/18/2017  . Previous cesarean delivery, delivered: arrest of dilation  11/18/2017  . Abnormal glucose tolerance test (GTT) during pregnancy, antepartum 09/17/2017  . Pregnant 05/12/2017  . Dizziness 02/15/2016  . Hepatitis B 06/14/2014    Past Surgical History:  Procedure Laterality Date  . CESAREAN SECTION N/A 01/26/2013   Procedure: Primary Cesarean Section Delivery Baby  Boy @ 0017, Apgars 4/8/9;  Surgeon: Freddrick MarchKendra H. Tenny Crawoss, MD;  Location: [facility];  Service: Obstetrics;  Laterality: N/A;  . CESAREAN SECTION N/A 11/18/2017   Procedure: CESAREAN SECTION;  Surgeon: [name], Richard, MD;  [facility];  Service: Obstetrics;  Laterality: N/A;  . COLPOSCOPY    . NASAL SINUS SURGERY       OB History    Gravida  7   Para  2   Term  2   Preterm      AB  5   Living  2     SAB  0   TAB  5   Ectopic      Multiple  0   Live Births  2            Home Medications    Prior to Admission medications   Medication Sig Start Date End Date Taking? Authorizing Provider  norethindrone (HEATHER) 0.35 MG tablet Take 1 tablet by mouth every evening.    Yes [provider]  tenofovir (VIREAD) 300 MG tablet Take 300 mg by mouth every evening.    Yes [provider]    Family History Family History  Adopted: Yes    Social History Social History   Tobacco Use  . Smoking status: Former Smoker    Packs/day: 1.00    Types: Cigarettes  . Smokeless tobacco: Never Used  . Tobacco comment: smoked for 8 years, quit in 2013  Substance Use Topics  . Alcohol use: Yes      Alcohol/week: 28.0 standard drinks    Types: 28 Glasses of wine per week    Comment: 4 glasses wine/day   . Drug use: No     Allergies   Patient has no known allergies.   Review of Systems Review of Systems Constitutional: Negative for chills and fever.  HENT: Negative for congestion, rhinorrhea and sore throat.   Eyes: Negative for visual disturbance.  Respiratory: Negative for cough and shortness of breath.   Cardiovascular: Negative for chest pain.  Gastrointestinal: Positive for abdominal pain. Negative for constipation, diarrhea, nausea and vomiting.  Genitourinary: Negative for dysuria, flank pain, frequency and hematuria.  Musculoskeletal: Negative for arthralgias and myalgias.  Skin: Negative for color change and rash.  Neurological: Negative for dizziness, syncope and light-headedness.   Physical Exam Updated Vital Signs BP 114/84 (BP Location: Left Arm)   Pulse 71   Temp 98.5 F (36.9 C) (Oral)   Resp 14   Ht 5' (1.524 m)   Wt 62.1 kg   LMP 06/09/2018 (Approximate)   SpO2 99%   BMI 26.76 kg/m   Physical Exam Vitals signs and nursing note reviewed.  Constitutional:      General: She is not in acute distress.    Appearance: She is well-developed and normal weight. She is not ill-appearing or diaphoretic.     Comments: Patient initially bent over with her face laying on the end of the bed unwilling to sit up but will talk and answer questions, reporting pain, but after talking with the patient more she was able to lay flat and cooperate with exam  HENT:     Head: Normocephalic and atraumatic.  Eyes:     General:        Right eye: No discharge.        Left eye: No discharge.     Pupils: Pupils are equal, round, and reactive to light.  Neck:     Musculoskeletal: Neck supple.  Cardiovascular:     Rate and Rhythm: Normal rate and regular rhythm.     Heart sounds: Normal heart sounds.  Pulmonary:     Effort: Pulmonary effort is normal. No respiratory  distress.     Breath sounds: Normal breath sounds. No wheezing or rales.  Abdominal:     General: Bowel sounds are normal. There is no distension.     Palpations: Abdomen is soft. There is no mass.     Tenderness: There is no abdominal tenderness. There is no guarding.     Comments: Abdomen soft, nondistended, bowel sounds present throughout, there is mild generalized tenderness throughout without focal guarding, no rigidity or rebound tenderness, no peritoneal signs. Musculoskeletal:  General: No deformity.  Skin:    General: Skin is warm and dry.     Capillary Refill: Capillary refill takes less than 2 seconds.  Neurological:     Mental Status: She is alert.     Coordination: Coordination normal.     Comments: Speech is clear, able to follow commands Moving all extremities without difficulty and normal coordination  ED Treatments / Results  Labs (all labs ordered are listed, but only abnormal results are displayed) Labs Reviewed  URINALYSIS, ROUTINE W REFLEX MICROSCOPIC - Abnormal; Notable for the following components:      Result Value   Color, Urine AMBER (*)    Nitrite POSITIVE (*)    All other components within normal limits  COMPREHENSIVE METABOLIC PANEL - Abnormal; Notable for the following components:   Sodium 134 (*)    Glucose, Bld 134 (*)    Creatinine, Ser 1.12 (*)    Total Protein 8.8 (*)    All other components within normal limits  URINE CULTURE  LIPASE, BLOOD  CBC WITH DIFFERENTIAL/PLATELET  I-STAT BETA HCG BLOOD, ED ([facility], [facility], [facility] ONLY)    EKG None  Radiology Ct Renal Stone Study  Result Date: 06/23/2018 CLINICAL DATA:  Generalized abdominal pain. EXAM: CT ABDOMEN AND PELVIS WITHOUT CONTRAST TECHNIQUE: Multidetector CT imaging of the abdomen and pelvis was performed following the standard protocol without IV contrast. COMPARISON:  CT scan of May 25, 2018. FINDINGS: Lower chest: No acute abnormality. Hepatobiliary: No gallstones or biliary  dilatation is noted. Hepatic steatosis is noted. Pancreas: Unremarkable. No pancreatic ductal dilatation or surrounding inflammatory changes. Spleen: Normal in size without focal abnormality. Adrenals/Urinary Tract: Adrenal glands are unremarkable. Kidneys are normal, without renal calculi, focal lesion, or hydronephrosis. Bladder is unremarkable. Stomach/Bowel: Stomach is within normal limits. Appendix appears normal. No evidence of bowel wall thickening, distention, or inflammatory changes. Vascular/Lymphatic: No significant vascular findings are present. No enlarged abdominal or pelvic lymph nodes. Reproductive: Uterus and bilateral adnexa are unremarkable. Other: Mild ascites is noted. Musculoskeletal: No acute or significant osseous findings. IMPRESSION: Mild ascites. Hepatic steatosis. No other abnormality seen in the abdomen or pelvis. Electronically Signed   By: Lupita Raider, M.D.   On: 06/23/2018 13:33    Procedures Procedures (including critical care time)  Medications Ordered in ED Medications  sodium chloride 0.9 % bolus 1,000 mL (0 mLs Intravenous Stopped 06/23/18 1303)  ondansetron (ZOFRAN) injection 4 mg (4 mg Intravenous Given 06/23/18 1031)  morphine 4 MG/ML injection 4 mg (4 mg Intravenous Given 06/23/18 1034)  acetaminophen (TYLENOL) tablet 1,000 mg (1,000 mg Oral Given 06/23/18 1309)  sodium chloride 0.9 % bolus 1,000 mL (1,000 mLs Intravenous New Bag/Given 06/23/18 1424)     Initial Impression / Assessment and Plan / ED Course  I have reviewed the triage vital signs and the nursing notes.  Pertinent labs & imaging results that were available during my care of the patient were reviewed by me and considered in my medical decision making (see chart for details).  Patient presents with concern for recurrent UTI as she was seen with similar symptoms of generalized abdominal pain about a month ago and was found to have UTI.  She denies any focal urinary symptoms.  No fevers or  chills, no flank pain, no nausea or vomiting.  Reports generalized abdominal pain, that is mild but intermittently acutely worsens to 10 out of 10 pain.  Similar presentation on 12/16, at that time had no acute findings on CT and  was treated for UTI.  Patient initially bent over in pain on my evaluation and will speak to me but will not sit up, eventually cooperative with exam abdomen with mild diffuse tenderness but no focal guarding or rigidity, not concerning for surgical abdomen or SBP in the setting of patient's hepatitis.  She is afebrile and overall well-appearing.  Will check urinalysis, urine culture and abdominal labs and given patient's acute worsening of pain with possible urinary issue will check CT renal study.  IV fluids, Zofran and morphine given for pain.  Labs overall very reassuring with no leukocytosis, normal hemoglobin, negative pregnancy, glucose slightly elevated at 134 but no other acute electrolyte derangements.  Creatinine is elevated at 1.12 was previously 0.68, will give fluids, this is likely in the setting of dehydration although CT will also assess for post renal etiology, with any obstructive uropathy.  Normal liver function and lipase.  Awaiting urinalysis patient reports she is unable to urinate at this time.  CT renal stone study shows no evidence of renal stone or obstructive uropathy there is mild ascites noted with some hepatic steatosis, but again abdominal exam is not concerning for SBP.  No other acute abnormalities noted within the abdomen or pelvis.  Still unable to provide urine sample, given p.o. fluids and additional bolus.   Patient bladder scan and I&O cathed, urinalysis positive for nitrates but no other signs of infection and patient not having focal urinary symptoms.  Urine sent for culture but do not feel she requires antibiotics for treatment at this time.  Patient continues to report significant discomfort despite pain medication but no clear cause for  this pain has been determined.  Repeat abdominal exams without peritoneal signs.  Case discussed with Dr. Freida BusmanAllen who examined patient as well and recommends repeat CT with contrast to look for any acute finding that could have been missed with noncontrasted scan, will give repeat doses of pain medications.  At shift change care signed out to PA Lyndel SafeElizabeth Hammond who will follow-up on CT abdomen pelvis with contrast if this does not show any acute abnormality then patient can be discharged home short course of pain medicine and follow-up with her GI doctor for further evaluation of ascites and management of her hepatitis.  Urine culture pending and patient is aware she will be called if it grows anything that would require antibiotics.  Discussed plan with patient and husband and they are in agreement.   Final Clinical Impressions(s) / ED Diagnoses   Final diagnoses:  Generalized abdominal pain  Ascites of liver    ED Discharge Orders    None       Legrand RamsFord, Billyjack Trompeter N, PA-C 06/23/18 1750    Lorre NickAllen, Anthony, MD 06/25/18 1615

## 2018-06-23 NOTE — ED Provider Notes (Signed)
I assumed care of patient from Kim Geralds, PA-C at shift change, please see her note for full H&P and plan.  Briefly patient is here for evaluation of abdominal pain.    Physical Exam  BP 115/81   Pulse 60   Temp 98.5 F (36.9 C) (Oral)   Resp 15   Ht 5' (1.524 m)   Wt 62.1 kg   LMP 06/09/2018 (Approximate)   SpO2 100%   BMI 26.76 kg/m   Physical Exam Vitals signs and nursing note reviewed.  Constitutional:      General: She is not in acute distress.    Appearance: She is well-developed. She is not diaphoretic.  HENT:     Head: Normocephalic and atraumatic.  Eyes:     General: No scleral icterus.       Right eye: No discharge.        Left eye: No discharge.     Conjunctiva/sclera: Conjunctivae normal.  Neck:     Musculoskeletal: Normal range of motion.  Cardiovascular:     Rate and Rhythm: Normal rate.  Pulmonary:     Effort: Pulmonary effort is normal. No respiratory distress.     Breath sounds: No stridor.  Abdominal:     General: There is no distension.  Musculoskeletal:        General: No deformity.  Skin:    General: Skin is warm and dry.  Neurological:     Mental Status: She is alert.     Motor: No abnormal muscle tone.  Psychiatric:        Behavior: Behavior normal.     ED Course/Procedures   Clinical Course as of Jun 24 6  Tue Jun 23, 2018  [id number] Patient reevaluated, lungs are clear to auscultation bilaterally.  She says she feels better and is ready to go home.    [EH]    Clinical Course User Index [EH] Cristina Gong, PA-C    Procedures   Ct Abdomen Pelvis W Contrast  Result Date: 06/23/2018 CLINICAL DATA:  Generalized abdominal pain for several days. EXAM: CT ABDOMEN AND PELVIS WITH CONTRAST TECHNIQUE: Multidetector CT imaging of the abdomen and pelvis was performed using the standard protocol following bolus administration of intravenous contrast. CONTRAST:  100 mL ISOVUE-300 IOPAMIDOL (ISOVUE-300) INJECTION 61% COMPARISON:  CT  abdomen and pelvis without contrast earlier today. CT abdomen and pelvis 05/25/2018. FINDINGS: Lower chest: Heart size normal. No pleural or pericardial effusion. Mild dependent atelectasis noted. Hepatobiliary: Diffuse fatty infiltration of the liver is seen. No focal liver lesion. The gallbladder and biliary tree appear normal. Pancreas: Unremarkable. No pancreatic ductal dilatation or surrounding inflammatory changes. Spleen: Normal in size without focal abnormality. Adrenals/Urinary Tract: Adrenal glands are unremarkable. Kidneys are normal, without renal calculi, focal lesion, or hydronephrosis. Bladder is unremarkable. Stomach/Bowel: Stomach is within normal limits. Appendix appears normal. No evidence of bowel wall thickening, distention, or inflammatory changes. Vascular/Lymphatic: No significant vascular findings are present. No enlarged abdominal or pelvic lymph nodes. Duplicated inferior vena cava is noted. Reproductive: Uterus and bilateral adnexa are unremarkable. Other: Small to moderate volume of abdominal and pelvic ascites is seen as on the study earlier today. Cause for ascites is not identified. Musculoskeletal: Normal. IMPRESSION: Small to moderate volume of abdominal and pelvic ascites is seen as on the examination earlier today. Cause for this finding is not identified. Fatty infiltration of the liver. Electronically Signed   By: Drusilla Kanner M.D.   On: 06/23/2018 17:30   Ct Renal Stone  Study  Result Date: 06/23/2018 CLINICAL DATA:  Generalized abdominal pain. EXAM: CT ABDOMEN AND PELVIS WITHOUT CONTRAST TECHNIQUE: Multidetector CT imaging of the abdomen and pelvis was performed following the standard protocol without IV contrast. COMPARISON:  CT scan of May 25, 2018. FINDINGS: Lower chest: No acute abnormality. Hepatobiliary: No gallstones or biliary dilatation is noted. Hepatic steatosis is noted. Pancreas: Unremarkable. No pancreatic ductal dilatation or surrounding inflammatory  changes. Spleen: Normal in size without focal abnormality. Adrenals/Urinary Tract: Adrenal glands are unremarkable. Kidneys are normal, without renal calculi, focal lesion, or hydronephrosis. Bladder is unremarkable. Stomach/Bowel: Stomach is within normal limits. Appendix appears normal. No evidence of bowel wall thickening, distention, or inflammatory changes. Vascular/Lymphatic: No significant vascular findings are present. No enlarged abdominal or pelvic lymph nodes. Reproductive: Uterus and bilateral adnexa are unremarkable. Other: Mild ascites is noted. Musculoskeletal: No acute or significant osseous findings. IMPRESSION: Mild ascites. Hepatic steatosis. No other abnormality seen in the abdomen or pelvis. Electronically Signed   By: Lupita Raider, M.D.   On: 06/23/2018 13:33     MDM   Plan is to f/u on CT with contrast, if normal home with low dose of IR non tylenol narcotic and follow up with GI. Bentyl.   CT scan with contrast abdomen pelvis shows ascites and fatty infiltration of liver without evidence of acute abnormalities beyond that.  She does not have peritoneal signs.  She is established with GI and has a known history of hepatitis.  Given prescription for Bentyl, Narcotic pain medication after PMP was queried.   Patient reportedly had some chest discomfort after IV contrast, got benadryl and felt better.  She has been in the ED for over an hour since with out worsening of condition.   Return precautions were discussed with patient who states their understanding.  At the time of discharge patient denied any unaddressed complaints or concerns.  Patient is agreeable for discharge home.        Cristina Gong, PA-C 06/24/18 0011    Shaune Pollack, MD 06/24/18 [phone]

## 2018-06-23 NOTE — ED Triage Notes (Signed)
Pt presents with c/o "UTI symptoms". Pt reports generalized abdominal pain. Pt reports she was here for the same about a month ago and diagnosed with a UTI. Pt denies any burning with urination or hematuria.

## 2018-06-24 ENCOUNTER — Encounter (HOSPITAL_COMMUNITY): Payer: Self-pay

## 2018-06-24 ENCOUNTER — Ambulatory Visit: Payer: BLUE CROSS/BLUE SHIELD | Admitting: Family Medicine

## 2018-06-24 ENCOUNTER — Encounter: Payer: Self-pay | Admitting: Family Medicine

## 2018-06-24 ENCOUNTER — Emergency Department (HOSPITAL_COMMUNITY): Payer: BLUE CROSS/BLUE SHIELD

## 2018-06-24 ENCOUNTER — Inpatient Hospital Stay (HOSPITAL_COMMUNITY)
Admission: EM | Admit: 2018-06-24 | Discharge: 2018-06-26 | DRG: 683 | Disposition: A | Discharge status: Home / Self Care | Payer: BLUE CROSS/BLUE SHIELD | Attending: Internal Medicine | Admitting: Internal Medicine

## 2018-06-24 ENCOUNTER — Other Ambulatory Visit: Payer: Self-pay

## 2018-06-24 ENCOUNTER — Ambulatory Visit: Payer: Self-pay

## 2018-06-24 VITALS — BP 148/90 | HR 113 | Temp 98.7°F

## 2018-06-24 DIAGNOSIS — Z8632 Personal history of gestational diabetes: Secondary | ICD-10-CM

## 2018-06-24 DIAGNOSIS — R338 Other retention of urine: Secondary | ICD-10-CM | POA: Diagnosis present

## 2018-06-24 DIAGNOSIS — R140 Abdominal distension (gaseous): Secondary | ICD-10-CM

## 2018-06-24 DIAGNOSIS — K760 Fatty (change of) liver, not elsewhere classified: Secondary | ICD-10-CM | POA: Diagnosis not present

## 2018-06-24 DIAGNOSIS — F1010 Alcohol abuse, uncomplicated: Secondary | ICD-10-CM | POA: Diagnosis present

## 2018-06-24 DIAGNOSIS — B1910 Unspecified viral hepatitis B without hepatic coma: Secondary | ICD-10-CM | POA: Diagnosis present

## 2018-06-24 DIAGNOSIS — K5909 Other constipation: Secondary | ICD-10-CM

## 2018-06-24 DIAGNOSIS — Z79891 Long term (current) use of opiate analgesic: Secondary | ICD-10-CM

## 2018-06-24 DIAGNOSIS — R339 Retention of urine, unspecified: Secondary | ICD-10-CM | POA: Diagnosis not present

## 2018-06-24 DIAGNOSIS — Z87440 Personal history of urinary (tract) infections: Secondary | ICD-10-CM | POA: Diagnosis not present

## 2018-06-24 DIAGNOSIS — N179 Acute kidney failure, unspecified: Principal | ICD-10-CM | POA: Diagnosis present

## 2018-06-24 DIAGNOSIS — Z87891 Personal history of nicotine dependence: Secondary | ICD-10-CM

## 2018-06-24 DIAGNOSIS — Z915 Personal history of self-harm: Secondary | ICD-10-CM

## 2018-06-24 DIAGNOSIS — R8281 Pyuria: Secondary | ICD-10-CM | POA: Diagnosis not present

## 2018-06-24 DIAGNOSIS — Z79899 Other long term (current) drug therapy: Secondary | ICD-10-CM | POA: Diagnosis not present

## 2018-06-24 DIAGNOSIS — F329 Major depressive disorder, single episode, unspecified: Secondary | ICD-10-CM | POA: Diagnosis present

## 2018-06-24 DIAGNOSIS — R1084 Generalized abdominal pain: Secondary | ICD-10-CM | POA: Diagnosis not present

## 2018-06-24 DIAGNOSIS — R1011 Right upper quadrant pain: Secondary | ICD-10-CM

## 2018-06-24 DIAGNOSIS — Z538 Procedure and treatment not carried out for other reasons: Secondary | ICD-10-CM | POA: Diagnosis not present

## 2018-06-24 DIAGNOSIS — R188 Other ascites: Secondary | ICD-10-CM

## 2018-06-24 DIAGNOSIS — B181 Chronic viral hepatitis B without delta-agent: Secondary | ICD-10-CM | POA: Diagnosis not present

## 2018-06-24 DIAGNOSIS — K5900 Constipation, unspecified: Secondary | ICD-10-CM | POA: Diagnosis not present

## 2018-06-24 HISTORY — DX: Other ascites: R18.8

## 2018-06-24 HISTORY — DX: Other constipation: K59.09

## 2018-06-24 LAB — COMPREHENSIVE METABOLIC PANEL
ALT: 27 U/L (ref 0–44)
AST: 19 U/L (ref 15–41)
Albumin: 4.8 g/dL (ref 3.5–5.0)
Alkaline Phosphatase: 66 U/L (ref 38–126)
Anion gap: 10 (ref 5–15)
BUN: 25 mg/dL — ABNORMAL HIGH (ref 6–20)
CO2: 20 mmol/L — ABNORMAL LOW (ref 22–32)
Calcium: 9.1 mg/dL (ref 8.9–10.3)
Chloride: 107 mmol/L (ref 98–111)
Creatinine, Ser: 3.1 mg/dL — ABNORMAL HIGH (ref 0.44–1.00)
GFR calc Af Amer: 22 mL/min — ABNORMAL LOW (ref >60)
GFR, EST NON AFRICAN AMERICAN: 19 mL/min — AB (ref >60)
Glucose, Bld: 110 mg/dL — ABNORMAL HIGH (ref 70–99)
Potassium: 4.3 mmol/L (ref 3.5–5.1)
Sodium: 137 mmol/L (ref 135–145)
Total Bilirubin: 1 mg/dL (ref 0.3–1.2)
Total Protein: 8.9 g/dL — ABNORMAL HIGH (ref 6.5–8.1)

## 2018-06-24 LAB — ACETAMINOPHEN LEVEL: Acetaminophen (Tylenol), Serum: <10 ug/mL — ABNORMAL LOW (ref 10–30)

## 2018-06-24 LAB — URINALYSIS, ROUTINE W REFLEX MICROSCOPIC
Bilirubin Urine: NEGATIVE
Glucose, UA: NEGATIVE mg/dL
Ketones, ur: NEGATIVE mg/dL
Leukocytes, UA: NEGATIVE
Nitrite: POSITIVE — AB
Protein, ur: 100 mg/dL — AB
Specific Gravity, Urine: 1.035 — ABNORMAL HIGH (ref 1.005–1.030)
pH: 5 (ref 5.0–8.0)

## 2018-06-24 LAB — CBC WITH DIFFERENTIAL/PLATELET
Abs Immature Granulocytes: 0.07 10*3/uL (ref 0.00–0.07)
BASOS PCT: 0 %
Basophils Absolute: 0 10*3/uL (ref 0.0–0.1)
EOS ABS: 0 10*3/uL (ref 0.0–0.5)
Eosinophils Relative: 0 %
HCT: 43.9 % (ref 36.0–46.0)
Hemoglobin: 14 g/dL (ref 12.0–15.0)
Immature Granulocytes: 1 %
Lymphocytes Relative: 17 %
Lymphs Abs: 1.6 10*3/uL (ref 0.7–4.0)
MCH: 29.9 pg (ref 26.0–34.0)
MCHC: 31.9 g/dL (ref 30.0–36.0)
MCV: 93.6 fL (ref 80.0–100.0)
Monocytes Absolute: 0.8 10*3/uL (ref 0.1–1.0)
Monocytes Relative: 8 %
Neutro Abs: 7 10*3/uL (ref 1.7–7.7)
Neutrophils Relative %: 74 %
Platelets: 247 10*3/uL (ref 150–400)
RBC: 4.69 MIL/uL (ref 3.87–5.11)
RDW: 13.1 % (ref 11.5–15.5)
WBC: 9.5 10*3/uL (ref 4.0–10.5)
nRBC: 0 % (ref 0.0–0.2)

## 2018-06-24 LAB — PROTIME-INR
INR: 0.93
Prothrombin Time: 12.4 seconds (ref 11.4–15.2)

## 2018-06-24 LAB — RAPID URINE DRUG SCREEN, HOSP PERFORMED
Amphetamines: NOT DETECTED
Barbiturates: NOT DETECTED
Benzodiazepines: NOT DETECTED
Cocaine: NOT DETECTED
Opiates: POSITIVE — AB
Tetrahydrocannabinol: NOT DETECTED

## 2018-06-24 LAB — SODIUM, URINE, RANDOM: Sodium, Ur: 53 mmol/L

## 2018-06-24 LAB — BRAIN NATRIURETIC PEPTIDE: B Natriuretic Peptide: 23.4 pg/mL (ref 0.0–100.0)

## 2018-06-24 LAB — URINE CULTURE: Culture: NO GROWTH

## 2018-06-24 LAB — LIPASE, BLOOD: LIPASE: 36 U/L (ref 11–51)

## 2018-06-24 LAB — ETHANOL: Alcohol, Ethyl (B): <10 mg/dL (ref <10)

## 2018-06-24 LAB — TSH: TSH: 2.005 u[IU]/mL (ref 0.350–4.500)

## 2018-06-24 LAB — CREATININE, URINE, RANDOM: Creatinine, Urine: 174.89 mg/dL

## 2018-06-24 LAB — PREGNANCY, URINE: Preg Test, Ur: NEGATIVE

## 2018-06-24 MED ORDER — ONDANSETRON HCL 4 MG/2ML IJ SOLN
4.0000 mg | Freq: Four times a day (QID) | INTRAMUSCULAR | Status: DC | PRN
  Administered 2018-06-25 (×2): 4 mg via INTRAVENOUS
  Filled 2018-06-24 (×2): qty 2

## 2018-06-24 MED ORDER — LORAZEPAM 2 MG/ML IJ SOLN
1.0000 mg | Freq: Four times a day (QID) | INTRAMUSCULAR | Status: DC | PRN
  Administered 2018-06-25: 1 mg via INTRAVENOUS
  Filled 2018-06-24: qty 1

## 2018-06-24 MED ORDER — ACETAMINOPHEN 325 MG PO TABS
650.0000 mg | ORAL_TABLET | Freq: Four times a day (QID) | ORAL | Status: DC | PRN
  Administered 2018-06-26: 650 mg via ORAL
  Filled 2018-06-24: qty 2

## 2018-06-24 MED ORDER — ACETAMINOPHEN 650 MG RE SUPP: 650.0000 mg | Freq: Four times a day (QID) | RECTAL | Status: DC | PRN

## 2018-06-24 MED ORDER — THIAMINE HCL 100 MG/ML IJ SOLN: 100.0000 mg | Freq: Every day | INTRAMUSCULAR | Status: DC

## 2018-06-24 MED ORDER — HYDROCODONE-ACETAMINOPHEN 5-325 MG PO TABS
1.0000 | ORAL_TABLET | ORAL | Status: DC | PRN
  Administered 2018-06-25 – 2018-06-26 (×6): 2 via ORAL
  Filled 2018-06-24 (×6): qty 2

## 2018-06-24 MED ORDER — FOLIC ACID 1 MG PO TABS
1.0000 mg | ORAL_TABLET | Freq: Every day | ORAL | Status: DC
  Administered 2018-06-25 – 2018-06-26 (×2): 1 mg via ORAL
  Filled 2018-06-24 (×2): qty 1

## 2018-06-24 MED ORDER — MORPHINE SULFATE (PF) 4 MG/ML IV SOLN
4.0000 mg | Freq: Once | INTRAVENOUS | Status: AC
  Administered 2018-06-24: 4 mg via INTRAVENOUS
  Filled 2018-06-24: qty 1

## 2018-06-24 MED ORDER — ONDANSETRON HCL 4 MG PO TABS: 4.0000 mg | ORAL_TABLET | Freq: Four times a day (QID) | ORAL | Status: DC | PRN

## 2018-06-24 MED ORDER — ADULT MULTIVITAMIN W/MINERALS CH
1.0000 | ORAL_TABLET | Freq: Every day | ORAL | Status: DC
  Administered 2018-06-25 – 2018-06-26 (×2): 1 via ORAL
  Filled 2018-06-24 (×2): qty 1

## 2018-06-24 MED ORDER — MILK AND MOLASSES ENEMA
1.0000 | Freq: Once | RECTAL | Status: AC
  Administered 2018-06-25: 250 mL via RECTAL
  Filled 2018-06-24: qty 250

## 2018-06-24 MED ORDER — BISACODYL 10 MG RE SUPP: 10.0000 mg | Freq: Every day | RECTAL | Status: DC | PRN

## 2018-06-24 MED ORDER — VITAMIN B-1 100 MG PO TABS
100.0000 mg | ORAL_TABLET | Freq: Every day | ORAL | Status: DC
  Administered 2018-06-25 – 2018-06-26 (×2): 100 mg via ORAL
  Filled 2018-06-24 (×2): qty 1

## 2018-06-24 MED ORDER — TENOFOVIR DISOPROXIL FUMARATE 300 MG PO TABS
300.0000 mg | ORAL_TABLET | Freq: Every evening | ORAL | Status: DC
  Administered 2018-06-25 – 2018-06-26 (×2): 300 mg via ORAL
  Filled 2018-06-24 (×3): qty 1

## 2018-06-24 MED ORDER — LORAZEPAM 1 MG PO TABS: 1.0000 mg | ORAL_TABLET | Freq: Four times a day (QID) | ORAL | Status: DC | PRN

## 2018-06-24 MED ORDER — SODIUM CHLORIDE 0.9 % IV SOLN
INTRAVENOUS | Status: AC
  Administered 2018-06-25: 01:00:00 via INTRAVENOUS

## 2018-06-24 MED ORDER — POLYETHYLENE GLYCOL 3350 17 G PO PACK: 17.0000 g | PACK | Freq: Every day | ORAL | Status: DC | PRN

## 2018-06-24 NOTE — ED Provider Notes (Signed)
Emergency Department Provider Note   I have reviewed the triage vital signs and the nursing notes.   HISTORY  Chief Complaint Abdominal Pain; abdominal swelling; and Constipation   HPI Juluis MireMaria K Sollecito-Olson is a 33 y.o. female with PMH of DM, Hep B, and alcohol abuse presents to the emergency department from her PCP with worsening abdominal distention and urinary retention.  The patient states she has been diagnosed with hepatitis B since birth.  She began having abdominal pain and urinary retention yesterday.  She went to the emergency department for evaluation where she had labs, CT scan, and UA.  The CT scan showed ascites.  Patient states she required in and out catheter yesterday for urination and has not urinated since.  She also has constipation.  She followed with her PCP today, Dr. Clent RidgesFry, who referred her to the emergency department.  She has no prior history of paracentesis.  She is not experiencing fevers or chills.  She denies shortness of breath.  Patient tells me that she does drink alcohol daily and endorses drinking at least 4 glasses of wine per day for many years.   Past Medical History:  Diagnosis Date  . Abnormal Pap smear    normal 06/2013  . Depression    suicide attempt in college  . Diabetes mellitus without complication (HCC)    gestational, no issues since pregnancy per her report  . Gestational diabetes    metformin  . Hepatitis    Hep B, managed by Dr. Kinnie ScalesMedoff  . History of chicken pox   . History of UTI   . Vaginal Pap smear, abnormal     Patient Active Problem List   Diagnosis Date Noted  . Body mass index (BMI) of 25.0 to 29.9 05/29/2018  . Cellulitis of breast 05/29/2018  . Fibrocystic disease of breast 05/29/2018  . Headache 05/29/2018  . Menstrual period late 05/29/2018  . Metrorrhagia 05/29/2018  . Postpartum state 05/29/2018  . Urinary tract infectious disease 05/29/2018  . Encounter for planned induction of labor 11/18/2017  . Oral  hypoglycemic controlled White classification A2 gestational diabetes mellitus (GDM) 11/18/2017  . Postpartum care following cesarean delivery (6/11) 11/18/2017  . Previous cesarean delivery, delivered: arrest of dilation  11/18/2017  . Abnormal glucose tolerance test (GTT) during pregnancy, antepartum 09/17/2017  . Pregnant 05/12/2017  . Dizziness 02/15/2016  . Hepatitis B 06/14/2014    Past Surgical History:  Procedure Laterality Date  . CESAREAN SECTION N/A 01/26/2013   Procedure: Primary Cesarean Section Delivery Baby  Boy @ 0017, Apgars 4/8/9;  Surgeon: Freddrick MarchKendra H. Tenny Crawoss, MD;  Location: [facility];  Service: Obstetrics;  Laterality: N/A;  . CESAREAN SECTION N/A 11/18/2017   Procedure: CESAREAN SECTION;  Surgeon: [name], Richard, MD;  [facility];  Service: Obstetrics;  Laterality: N/A;  . COLPOSCOPY    . NASAL SINUS SURGERY     Allergies Patient has no known allergies.  Family History  Adopted: Yes  Family history unknown: Yes    Social History Social History   Tobacco Use  . Smoking status: Former Smoker    Packs/day: 1.00    Types: Cigarettes  . Smokeless tobacco: Never Used  . Tobacco comment: smoked for 8 years, quit in 2013  Substance Use Topics  . Alcohol use: Yes    Alcohol/week: 28.0 standard drinks    Types: 28 Glasses of wine per week    Comment: 4 glasses wine/day   . Drug use: No  Review of Systems  Constitutional: No fever/chills Eyes: No visual changes. ENT: No sore throat. Cardiovascular: Denies chest pain. Respiratory: Denies shortness of breath. Gastrointestinal: Positive diffuse abdominal pain.  No nausea, no vomiting.  No diarrhea. Positive constipation. Positive abdominal distension.  Genitourinary: Positive urinary retention.  Musculoskeletal: Negative for back pain. Skin: Negative for rash. Neurological: Negative for headaches, focal weakness or numbness.  10-point ROS otherwise  negative.  ____________________________________________   PHYSICAL EXAM:  VITAL SIGNS: ED Triage Vitals  Enc Vitals Group     BP 06/24/18 1720 (!) 137/103     Pulse Rate 06/24/18 1720 (!) 106     Resp 06/24/18 1720 20     Temp 06/24/18 1720 98.2 F (36.8 C)     Temp Source 06/24/18 1720 Oral     SpO2 06/24/18 1720 98 %     Weight 06/24/18 1722 136 lb 14.5 oz (62.1 kg)     Height 06/24/18 1722 5' (1.524 m)     Pain Score 06/24/18 1720 9   Constitutional: Alert and oriented. Well appearing and in no acute distress. Eyes: Conjunctivae are normal. Head: Atraumatic. Nose: No congestion/rhinnorhea. Mouth/Throat: Mucous membranes are moist.  Neck: No stridor.  Cardiovascular: Normal rate, regular rhythm. Good peripheral circulation. Grossly normal heart sounds.   Respiratory: Normal respiratory effort.  No retractions. Lungs CTAB. Gastrointestinal: Tense abdomen with tenderness. Positive distention.  Musculoskeletal: No lower extremity tenderness nor edema. No gross deformities of extremities. Neurologic:  Normal speech and language. No gross focal neurologic deficits are appreciated.  Skin:  Skin is warm, dry and intact. No rash noted.  ____________________________________________   LABS (all labs ordered are listed, but only abnormal results are displayed)  Labs Reviewed  COMPREHENSIVE METABOLIC PANEL - Abnormal; Notable for the following components:      Result Value   CO2 20 (*)    Glucose, Bld 110 (*)    BUN 25 (*)    Creatinine, Ser 3.10 (*)    Total Protein 8.9 (*)    GFR calc non Af Amer 19 (*)    GFR calc Af Amer 22 (*)    All other components within normal limits  URINALYSIS, ROUTINE W REFLEX MICROSCOPIC - Abnormal; Notable for the following components:   Color, Urine AMBER (*)    Specific Gravity, Urine 1.035 (*)    Hgb urine dipstick SMALL (*)    Protein, ur 100 (*)    Nitrite POSITIVE (*)    Bacteria, UA RARE (*)    All other components within normal  limits  ACETAMINOPHEN LEVEL - Abnormal; Notable for the following components:   Acetaminophen (Tylenol), Serum <10 (*)    All other components within normal limits  LIPASE, BLOOD  CBC WITH DIFFERENTIAL/PLATELET  PROTIME-INR  PREGNANCY, URINE  ETHANOL   ____________________________________________  RADIOLOGY  US Abdomen Limited Ruq  Result Date: 06/24/2018 CLINICAL DATA:  Right upper quadrant abdominal pain. EXAM: ULTRASOUND ABDOMEN LIMITED RIGHT UPPER QUADRANT COMPARISON:  CT scan of June 23, 2018. Ultrasound of March 18, 2018. FINDINGS: Gallbladder: No gallstones or wall thickening visualized. No sonographic Murphy sign noted by sonographer. Common bile duct: Diameter: 5 mm which is within normal limits. Liver: No focal lesion identified. Increased echogenicity of hepatic parenchyma is noted suggesting hepatic steatosis. Portal vein is patent on color Doppler imaging with normal direction of blood flow towards the liver. IMPRESSION: Hepatic steatosis. Mild ascites is noted. No other abnormality seen in the right upper quadrant of the abdomen. Electronically Signed  By: Lupita RaiderJames  Green Jr, M.D.   On: 06/24/2018 19:46    ____________________________________________   PROCEDURES  Procedure(s) performed:   Procedures  None ____________________________________________   INITIAL IMPRESSION / ASSESSMENT AND PLAN / ED COURSE  Pertinent labs & imaging results that were available during my care of the patient were reviewed by me and considered in my medical decision making (see chart for details).  Patient returns to the emergency department from her PCP with worsening abdominal distention and urinary retention.  She has a tense abdomen on exam with mildmoderate volume ascites showing on bedside ultrasound with bowel loops relatively close to the anterior abdomen.  My suspicion for SBP is low. Patient's labs and imaging from yesterday reviewed.  Plan for repeat labs, liver/RUQ US, and  admit for further f/u likely to include LVP.   09:05 PM Patient's ultrasound reviewed with no acute findings.  Patient with significant elevation in creatinine compared to yesterday.  Patient has improved pain after inserting the Foley catheter.  I suspect that this is an obstructive uropathy but plan to leave the Foley catheter in place until additional testing can be performed.  The patient's abdomen is now somewhat less distended but still feels full and tight.  No focal pain on reexam.  Discussed patient's case with Hospitalist, Dr. Adela Glimpseoutova to request admission. Patient and family (if present) updated with plan. Care transferred to Hospitalist service.  I reviewed all nursing notes, vitals, pertinent old records, EKGs, labs, imaging (as available).  ____________________________________________  FINAL CLINICAL IMPRESSION(S) / ED DIAGNOSES  Final diagnoses:  RUQ abdominal pain  AKI (acute kidney injury) (HCC)  Generalized abdominal pain  Urinary retention    MEDICATIONS GIVEN DURING THIS VISIT:  Medications  morphine 4 MG/ML injection 4 mg (4 mg Intravenous Given 06/24/18 1941)    Note:  This document was prepared using Dragon voice recognition software and may include unintentional dictation errors.  Alona BeneJoshua Long, MD Emergency Medicine    Long, Arlyss RepressJoshua G, MD 06/24/18 [phone]

## 2018-06-24 NOTE — ED Triage Notes (Addendum)
Patient ws seen in the Ed yesterday for abdominal swelling. Patient went to her PCP today and was told to come to the ED to have fluid removed from abdomen. Patient states no BM in 3-4 days.

## 2018-06-24 NOTE — ED Notes (Signed)
Bed: WA24 Expected date:  Expected time:  Means of arrival:  Comments: Triage 1 

## 2018-06-24 NOTE — Telephone Encounter (Signed)
Noted  

## 2018-06-24 NOTE — H&P (Signed)
Kim BowerMaria K Pacheco ZOX:[id number] DOB: 13-Mar-1986 DOA: 06/24/2018     PCP: Kim Pacheco, [name], Pacheco   Outpatient Specialists:    [facility] Dr.  Elsie Pacheco    Patient arrived to ER on 06/24/18 at 1712  Patient coming from: home Lives  With family    Chief Complaint:  Chief Complaint  Patient presents with  . Abdominal Pain  . abdominal swelling  . Constipation    HPI: Kim MireMaria K Pacheco is a 33 y.o. female with medical history significant of hepatitis B, depression, and prior gestational diabetes    Presented with domino pain and UTI type of symptoms he was seen in the emergency department yesterday on 14 January No blood in urine no flank pain pain has been coming and going.  No associated fevers or chills. Yesterday she had a CT renal study done that showed no renal stone or obstructive uropathy. Did show mild ascites and hepatic steatosis. UA yesterday showed some nitrates but otherwise no evidence of infection.  Urine was sent for culture at that time antibiotics not started because UTI was felt to be less likely  He went home and continued to have significant pain with worsening abdominal swelling. She started to have urinary retention yesterday she required in and out catheter yesterday but have not urinated since.  She has significant constipation.  No bowel movements for the past 3 to 4 days.  No associated fevers or chills still.  No shortness of breath no chest pain  She has been drinking at least 4 glasses of wine per day for many years.  States she quit drinking when she had a baby and was breast-feeding but then she stopped breast-feeding resumed drinking again States she feels somewhat tremulous/anxious when she does not drink but no history of alcohol seizures or withdrawals, She Is interested in quitting  She has tries stool softener and suppository, ussualy has a BM every other day She has recently used DayQuil and NyQuil   Regarding pertinent  Chronic problems: Hx of gestational DM Re Hep B she was born with this she used to see Dr. Lottie Pacheco She was on Viread she is still taking    While in ER: Creatinine noted to be elevated 3.1 Ultrasound of abdomen done showing hepatic steatosis mild ascites otherwise unremarkable Pain has improved after Foley catheter placement The following Work up has been ordered so far:  Orders Placed This Encounter  Procedures  . US Abdomen Limited RUQ  . Comprehensive metabolic panel  . Lipase, blood  . CBC with Differential  . Protime-INR  . Urinalysis, Routine w reflex microscopic  . Pregnancy, urine  . Acetaminophen level  . Ethanol  . Insert foley catheter  . Consult to hospitalist  . Saline lock IV    Following Medications were ordered in ER: Medications  morphine 4 MG/ML injection 4 mg (4 mg Intravenous Given 06/24/18 1941)    Significant initial  Findings: Abnormal Labs Reviewed  COMPREHENSIVE METABOLIC PANEL - Abnormal; Notable for the following components:      Result Value   CO2 20 (*)    Glucose, Bld 110 (*)    BUN 25 (*)    Creatinine, Ser 3.10 (*)    Total Protein 8.9 (*)    GFR calc non Af Amer 19 (*)    GFR calc Af Amer 22 (*)    All other components within normal limits  URINALYSIS, ROUTINE W REFLEX MICROSCOPIC - Abnormal; Notable for the following components:  Color, Urine AMBER (*)    Specific Gravity, Urine 1.035 (*)    Hgb urine dipstick SMALL (*)    Protein, ur 100 (*)    Nitrite POSITIVE (*)    Bacteria, UA RARE (*)    All other components within normal limits  ACETAMINOPHEN LEVEL - Abnormal; Notable for the following components:   Acetaminophen (Tylenol), Serum <10 (*)    All other components within normal limits     Lactic Acid, Venous No results found for: LATICACIDVEN  Na 137 K 4.3  Cr  Up from baseline see below Lab Results  Component Value Date   CREATININE 3.10 (H) 06/24/2018   CREATININE 1.12 (H) 06/23/2018   CREATININE 0.65  05/25/2018    protein 8.9  WBC  9.5  HG/HCT stable,     Component Value Date/Time   HGB 14.0 06/24/2018 1938   HCT 43.9 06/24/2018 1938   Lipase 36   INR 0.93   UA WBC 21-50  From 1/14 CTabd/pelvis - ascites,  ECG:  Not obtained    ED Triage Vitals  Enc Vitals Group     BP 06/24/18 1720 (!) 137/103     Pulse Rate 06/24/18 1720 (!) 106     Resp 06/24/18 1720 20     Temp 06/24/18 1720 98.2 F (36.8 C)     Temp Source 06/24/18 1720 Oral     SpO2 06/24/18 1720 98 %     Weight 06/24/18 1722 136 lb 14.5 oz (62.1 kg)     Height 06/24/18 1722 5' (1.524 m)     Head Circumference --      Peak Flow --      Pain Score 06/24/18 1720 9     Pain Loc --      Pain Edu? --      Excl. in GC? --   TMAX(24)@       Latest  Blood pressure 123/81, pulse 95, temperature 98.2 F (36.8 C), temperature source Oral, resp. rate 16, height 5' (1.524 m), weight 62.1 kg, last menstrual period 06/09/2018, SpO2 100 %, unknown if currently breastfeeding.   Hospitalist was called for admission for renal failure in the setting of urinary retention and ascites and constipation   Review of Systems:    Pertinent positives include: fatigue,abdominal pain,constipation, urinary retention   Constitutional:  No weight loss, night sweats, Fevers, chills,  weight loss  HEENT:  No headaches, Difficulty swallowing,Tooth/dental problems,Sore throat,  No sneezing, itching, ear ache, nasal congestion, post nasal drip,  Cardio-vascular:  No chest pain, Orthopnea, PND, anasarca, dizziness, palpitations.no Bilateral lower extremity swelling  GI:  No heartburn, indigestion,  nausea, vomiting, diarrhea, change in bowel habits, loss of appetite, melena, blood in stool, hematemesis Resp:  no shortness of breath at rest. No dyspnea on exertion, No excess mucus, no productive cough, No non-productive cough, No coughing up of blood.No change in color of mucus.No wheezing. Skin:  no rash or lesions. No  jaundice GU:  no dysuria, change in color of urine, no urgency or frequency. No straining to urinate.  No flank pain.  Musculoskeletal:  No joint pain or no joint swelling. No decreased range of motion. No back pain.  Psych:  No change in mood or affect. No depression or anxiety. No memory loss.  Neuro: no localizing neurological complaints, no tingling, no weakness, no double vision, no gait abnormality, no slurred speech, no confusion  All systems reviewed and apart from HOPI all are negative  Past Medical History:  Past Medical History:  Diagnosis Date  . Abnormal Pap smear    normal 06/2013  . Depression    suicide attempt in college  . Diabetes mellitus without complication (HCC)    gestational, no issues since pregnancy per her report  . Gestational diabetes    metformin  . Hepatitis    Hep B, managed by Dr. Kinnie Scales  . History of chicken pox   . History of UTI   . Vaginal Pap smear, abnormal       Past Surgical History:  Procedure Laterality Date  . CESAREAN SECTION N/A 01/26/2013   Procedure: Primary Cesarean Section Delivery Baby  Boy @ 0017, Apgars 4/8/9;  Surgeon: Freddrick March. Tenny Craw, MD;  Location: [facility];  Service: Obstetrics;  Laterality: N/A;  . CESAREAN SECTION N/A 11/18/2017   Procedure: CESAREAN SECTION;  Surgeon: Olivia Mackie, MD;  Location: [facility] SU[facility];  Service: Obstetrics;  Laterality: N/A;  . COLPOSCOPY    . NASAL SINUS SURGERY      Social History:  Ambulatory   Independently      reports that she has quit smoking. Her smoking use included cigarettes. She smoked 1.00 pack per day. She has never used smokeless tobacco. She reports current alcohol use of about 28.0 standard drinks of alcohol per week. She reports that she does not use drugs.     Family History:   Family History  Adopted: Yes  Family history unknown: Yes    Allergies: No Known Allergies   Prior to Admission medications   Medication Sig Start Date End Date Taking?  Authorizing Provider  dicyclomine (BENTYL) 20 MG tablet Take 1 tablet (20 mg total) by mouth 4 (four) times daily -  before meals and at bedtime. 06/23/18  Yes Cristina Gong, PA-C  oxyCODONE (OXY IR/ROXICODONE) 5 MG immediate release tablet Take 1 tablet (5 mg total) by mouth every 6 (six) hours as needed for severe pain. 06/23/18  Yes Cristina Gong, PA-C  tenofovir (VIREAD) 300 MG tablet Take 300 mg by mouth every evening.    Yes [provider]  norethindrone (HEATHER) 0.35 MG tablet Take 1 tablet by mouth every evening.     [provider]   Physical Exam: Blood pressure 123/81, pulse 95, temperature 98.2 F (36.8 C), temperature source Oral, resp. rate 16, height 5' (1.524 m), weight 62.1 kg, last menstrual period 06/09/2018, SpO2 100 %, unknown if currently breastfeeding. 1. General:  in No Acute distress  well  -appearing 2. Psychological: Alert and Oriented 3. Head/ENT:   Dry Mucous Membranes                          Head Non traumatic, neck supple                            Poor Dentition 4. SKIN:  decreased Skin turgor,  Skin clean Dry and intact no rash 5. Heart: Regular rate and rhythm no  Murmur, no Rub or gallop 6. Lungs Clear to auscultation bilaterally, no wheezes or crackles   7. Abdomen: Soft, mildly -tender,  distended  bowel sounds distatnt 8. Lower extremities: no clubbing, cyanosis, or edema 9. Neurologically Grossly intact, moving all 4 extremities equally  10. MSK: Normal range of motion   LABS:     Recent Labs  Lab 06/23/18 1031 06/24/18 1938  WBC 8.7 9.5  NEUTROABS 6.6 7.0  HGB 14.9 14.0  HCT 45.2 43.9  MCV 92.8 93.6  PLT 212 247   Basic Metabolic Panel: Recent Labs  Lab 06/23/18 1031 06/24/18 1938  NA 134* 137  K 4.2 4.3  CL 100 107  CO2 23 20*  GLUCOSE 134* 110*  BUN 17 25*  CREATININE 1.12* 3.10*  CALCIUM 9.5 9.1      Recent Labs  Lab 06/23/18 1031 06/24/18 1938  AST 26 19  ALT 33 27  ALKPHOS 63 66   BILITOT 0.9 1.0  PROT 8.8* 8.9*  ALBUMIN 4.9 4.8   Recent Labs  Lab 06/23/18 1031 06/24/18 1938  LIPASE 39 36   No results for input(s): AMMONIA in the last 168 hours.    HbA1C: No results for input(s): HGBA1C in the last 72 hours. CBG: No results for input(s): GLUCAP in the last 168 hours.    Urine analysis:    Component Value Date/Time   COLORURINE AMBER (A) 06/24/2018 1938   APPEARANCEUR CLEAR 06/24/2018 1938   LABSPEC 1.035 (H) 06/24/2018 1938   PHURINE 5.0 06/24/2018 1938   GLUCOSEU NEGATIVE 06/24/2018 1938   HGBUR SMALL (A) 06/24/2018 1938   BILIRUBINUR NEGATIVE 06/24/2018 1938   BILIRUBINUR negative 05/31/2016 1707   KETONESUR NEGATIVE 06/24/2018 1938   PROTEINUR 100 (A) 06/24/2018 1938   UROBILINOGEN 0.2 05/31/2016 1707   NITRITE POSITIVE (A) 06/24/2018 1938   LEUKOCYTESUR NEGATIVE 06/24/2018 1938      Cultures:    Component Value Date/Time   SDES  06/23/2018 1509    URINE, RANDOM Performed at [facility], [address], [place], [place] [address]    SPECREQUEST  06/23/2018 1509    NONE Performed at [facility], [address], [place], [place] [address]    CULT  06/23/2018 1509    NO GROWTH Performed at [facility], [address], [place], [place] [address]    REPTSTATUS 06/24/2018 FINAL 06/23/2018 1509     Radiological Exams on Admission: Ct Abdomen Pelvis W Contrast  Result Date: 06/23/2018 CLINICAL DATA:  Generalized abdominal pain for several days. EXAM: CT ABDOMEN AND PELVIS WITH CONTRAST TECHNIQUE: Multidetector CT imaging of the abdomen and pelvis was performed using the standard protocol following bolus administration of intravenous contrast. CONTRAST:  100 mL ISOVUE-300 IOPAMIDOL (ISOVUE-300) INJECTION 61% COMPARISON:  CT abdomen and pelvis without contrast earlier today. CT abdomen and pelvis 05/25/2018. FINDINGS: Lower chest: Heart size normal. No pleural or pericardial effusion. Mild  dependent atelectasis noted. Hepatobiliary: Diffuse fatty infiltration of the liver is seen. No focal liver lesion. The gallbladder and biliary tree appear normal. Pancreas: Unremarkable. No pancreatic ductal dilatation or surrounding inflammatory changes. Spleen: Normal in size without focal abnormality. Adrenals/Urinary Tract: Adrenal glands are unremarkable. Kidneys are normal, without renal calculi, focal lesion, or hydronephrosis. Bladder is unremarkable. Stomach/Bowel: Stomach is within normal limits. Appendix appears normal. No evidence of bowel wall thickening, distention, or inflammatory changes. Vascular/Lymphatic: No significant vascular findings are present. No enlarged abdominal or pelvic lymph nodes. Duplicated inferior vena cava is noted. Reproductive: Uterus and bilateral adnexa are unremarkable. Other: Small to moderate volume of abdominal and pelvic ascites is seen as on the study earlier today. Cause for ascites is not identified. Musculoskeletal: Normal. IMPRESSION: Small to moderate volume of abdominal and pelvic ascites is seen as on the examination earlier today. Cause for this finding is not identified. Fatty infiltration of the liver. Electronically Signed   By: Drusilla Kanner M.D.   On: 06/23/2018 17:30  Ct Renal Stone Study  Result Date: 06/23/2018 CLINICAL DATA:  Generalized abdominal pain. EXAM: CT ABDOMEN AND PELVIS WITHOUT CONTRAST TECHNIQUE: Multidetector CT imaging of the abdomen and pelvis was performed following the standard protocol without IV contrast. COMPARISON:  CT scan of May 25, 2018. FINDINGS: Lower chest: No acute abnormality. Hepatobiliary: No gallstones or biliary dilatation is noted. Hepatic steatosis is noted. Pancreas: Unremarkable. No pancreatic ductal dilatation or surrounding inflammatory changes. Spleen: Normal in size without focal abnormality. Adrenals/Urinary Tract: Adrenal glands are unremarkable. Kidneys are normal, without renal calculi, focal  lesion, or hydronephrosis. Bladder is unremarkable. Stomach/Bowel: Stomach is within normal limits. Appendix appears normal. No evidence of bowel wall thickening, distention, or inflammatory changes. Vascular/Lymphatic: No significant vascular findings are present. No enlarged abdominal or pelvic lymph nodes. Reproductive: Uterus and bilateral adnexa are unremarkable. Other: Mild ascites is noted. Musculoskeletal: No acute or significant osseous findings. IMPRESSION: Mild ascites. Hepatic steatosis. No other abnormality seen in the abdomen or pelvis. Electronically Signed   By: Lupita Raider, M.D.   On: 06/23/2018 13:33   US Abdomen Limited Ruq  Result Date: 06/24/2018 CLINICAL DATA:  Right upper quadrant abdominal pain. EXAM: ULTRASOUND ABDOMEN LIMITED RIGHT UPPER QUADRANT COMPARISON:  CT scan of June 23, 2018. Ultrasound of March 18, 2018. FINDINGS: Gallbladder: No gallstones or wall thickening visualized. No sonographic Murphy sign noted by sonographer. Common bile duct: Diameter: 5 mm which is within normal limits. Liver: No focal lesion identified. Increased echogenicity of hepatic parenchyma is noted suggesting hepatic steatosis. Portal vein is patent on color Doppler imaging with normal direction of blood flow towards the liver. IMPRESSION: Hepatic steatosis. Mild ascites is noted. No other abnormality seen in the right upper quadrant of the abdomen. Electronically Signed   By: Lupita Raider, M.D.   On: 06/24/2018 19:46    Chart has been reviewed    Assessment/Plan  33 y.o. female with medical history significant of hepatitis B, depression, and prior gestational diabetes Admitted for renal failure in the setting of urinary retention and ascites and constipation    Present on Admission: . Acute renal failure (ARF) (HCC) in the setting of urinary retention decreased p.o. intake will place Foley rehydrate and follow urine output . Hepatitis B as an outpatient patient will need to  resume follow-up.  Check hepatitis viral load and continue home medications . Alcohol abuse -order CIWA protocol spoke at length about importance of avoiding alcohol in the setting of chronic liver disease . Constipation -order bowel regimen and see if patient is able to have a good bowel movement which may help with abdominal distention and pain . Ascites -history of liver disease secondary to hepatitis B as well as ongoing alcohol abuse.  Order diagnostic paracentesis Urinary retention - status post Foley catheter placement unclear etiology Could be in the setting of recent over-the-counter medications as well as constipation, continue Foley catheter once constipation improve would attempt voiding trials Pyuria no bacteria in urine noted.  Urine culture from yesterday was obtained currently pending currently afebrile monitor and treat if needed Other plan as per orders.  DVT prophylaxis:  SCD   Code Status:  FULL CODE as per patient    I had personally discussed CODE STATUS with patient and family   Family Communication:   Family   at  Bedside  plan of care was discussed with Husband,   Disposition Plan:       To home once workup is complete and patient is stable  Consults called: none  Admission status:  Obs    Level of care     tele  For   24H        Sierra Bissonette 06/24/2018, 11:12 PM    [facility]     after 2 AM please page floor coverage PA If 7AM-7PM, please contact the day team taking care of the patient using [email or web address]

## 2018-06-24 NOTE — Telephone Encounter (Signed)
Patient called and says she was in the ED yesterday for abdominal pain and it's no better today. Appointment scheduled for today at 1545 with Dr. Clent Ridges, patient advised.

## 2018-06-24 NOTE — ED Notes (Signed)
Patient stated that the MD said she could have some water, PO fluids given.

## 2018-06-24 NOTE — Progress Notes (Signed)
   Subjective:    Patient ID: Kim Pacheco, female    DOB: [birth year], 33 y.o.   MRN: [id number]  HPI Here with her friend for worsening generalized abdominal pain. She went to the ER on 05-25-18 for burning on urination, and she was diagnosed with a UTI. No culture was done. She was treated with Keflex, and she seemed to get over it. However over the past 4 days she has developed a different type of abdominal pain which started in the RLQ but has now generalized over the entire abdomen. No nausea or vomiting, though her appetite is decreased. No fever. Her last BM was 4 days ago (she normally averages one BM every 2 days). She was seen in the ER again yesterday and all labs were unremarkable, her urine was clear, and a CT scan was normal except for some hepatic steatosis and a small amount of abdominal and pelvic ascites. She tested negative for pregnancy and STDs. She was given a bowel suppository with no results. Since then she has rapidly developed worsening pain throughout the abdomen and diffuse abdominal swelling. Still no fever or NVD.  Review of Systems  Constitutional: Negative.   Respiratory: Negative.   Cardiovascular: Negative.   Gastrointestinal: Positive for abdominal distention, abdominal pain and constipation. Negative for blood in stool, diarrhea, nausea, rectal pain and vomiting.  Genitourinary: Negative.   Neurological: Negative.        Objective:   Physical Exam Constitutional:      Comments: Writhing in abdominal pain on the exam table   Cardiovascular:     Rate and Rhythm: Normal rate and regular rhythm.     Heart sounds: Normal heart sounds.  Pulmonary:     Effort: Pulmonary effort is normal. No respiratory distress.     Breath sounds: Normal breath sounds. No stridor. No wheezing, rhonchi or rales.  Abdominal:     Comments: The abdomen is quite distended and tight, she is very tender diffusely with guarding and positive rebound tenderness. There is CVA  tenderness over the back   Neurological:     Mental Status: She is alert.           Assessment & Plan:  Rapid appearance of abdominal swelling and pain of uncertain etiology. She will need to be admitted to the hospital for further workup including an US and possible paracentesis. Her [facility] here.  Gershon CraneStephen Essie Lagunes, MD

## 2018-06-24 NOTE — ED Notes (Signed)
Dr. Clent Ridges phones to tell us he has seen her today at his office and he is sending her to E.D. to be seen for: abd. Pain and abd. Swelling (?ascites?). He further states pt. Was seen here recently for same, but is "much worse". She is coming p.o.v.

## 2018-06-25 ENCOUNTER — Inpatient Hospital Stay (HOSPITAL_COMMUNITY): Payer: BLUE CROSS/BLUE SHIELD

## 2018-06-25 DIAGNOSIS — N179 Acute kidney failure, unspecified: Secondary | ICD-10-CM | POA: Diagnosis not present

## 2018-06-25 DIAGNOSIS — Z915 Personal history of self-harm: Secondary | ICD-10-CM | POA: Diagnosis not present

## 2018-06-25 DIAGNOSIS — B1910 Unspecified viral hepatitis B without hepatic coma: Secondary | ICD-10-CM | POA: Diagnosis present

## 2018-06-25 DIAGNOSIS — Z87891 Personal history of nicotine dependence: Secondary | ICD-10-CM | POA: Diagnosis not present

## 2018-06-25 DIAGNOSIS — R339 Retention of urine, unspecified: Secondary | ICD-10-CM | POA: Diagnosis present

## 2018-06-25 DIAGNOSIS — Z538 Procedure and treatment not carried out for other reasons: Secondary | ICD-10-CM | POA: Diagnosis present

## 2018-06-25 DIAGNOSIS — K5900 Constipation, unspecified: Secondary | ICD-10-CM | POA: Diagnosis present

## 2018-06-25 DIAGNOSIS — R188 Other ascites: Secondary | ICD-10-CM | POA: Diagnosis not present

## 2018-06-25 DIAGNOSIS — Z87440 Personal history of urinary (tract) infections: Secondary | ICD-10-CM | POA: Diagnosis not present

## 2018-06-25 DIAGNOSIS — Z79891 Long term (current) use of opiate analgesic: Secondary | ICD-10-CM | POA: Diagnosis not present

## 2018-06-25 DIAGNOSIS — R8281 Pyuria: Secondary | ICD-10-CM | POA: Diagnosis present

## 2018-06-25 DIAGNOSIS — F1010 Alcohol abuse, uncomplicated: Secondary | ICD-10-CM | POA: Diagnosis not present

## 2018-06-25 DIAGNOSIS — K760 Fatty (change of) liver, not elsewhere classified: Secondary | ICD-10-CM | POA: Diagnosis not present

## 2018-06-25 DIAGNOSIS — Z8632 Personal history of gestational diabetes: Secondary | ICD-10-CM | POA: Diagnosis not present

## 2018-06-25 DIAGNOSIS — R338 Other retention of urine: Secondary | ICD-10-CM | POA: Diagnosis not present

## 2018-06-25 DIAGNOSIS — F329 Major depressive disorder, single episode, unspecified: Secondary | ICD-10-CM | POA: Diagnosis present

## 2018-06-25 DIAGNOSIS — Z79899 Other long term (current) drug therapy: Secondary | ICD-10-CM | POA: Diagnosis not present

## 2018-06-25 HISTORY — DX: Acute kidney failure, unspecified: N17.9

## 2018-06-25 LAB — MAGNESIUM: Magnesium: 2 mg/dL (ref 1.7–2.4)

## 2018-06-25 LAB — CBC
HCT: 36.1 % (ref 36.0–46.0)
Hemoglobin: 11.5 g/dL — ABNORMAL LOW (ref 12.0–15.0)
MCH: 30.3 pg (ref 26.0–34.0)
MCHC: 31.9 g/dL (ref 30.0–36.0)
MCV: 95.3 fL (ref 80.0–100.0)
Platelets: 201 10*3/uL (ref 150–400)
RBC: 3.79 MIL/uL — ABNORMAL LOW (ref 3.87–5.11)
RDW: 13.1 % (ref 11.5–15.5)
WBC: 8.9 10*3/uL (ref 4.0–10.5)
nRBC: 0 % (ref 0.0–0.2)

## 2018-06-25 LAB — HIV ANTIBODY (ROUTINE TESTING W REFLEX): HIV Screen 4th Generation wRfx: NONREACTIVE

## 2018-06-25 LAB — COMPREHENSIVE METABOLIC PANEL
ALT: 18 U/L (ref 0–44)
AST: 15 U/L (ref 15–41)
Albumin: 3.5 g/dL (ref 3.5–5.0)
Alkaline Phosphatase: 51 U/L (ref 38–126)
Anion gap: 8 (ref 5–15)
BILIRUBIN TOTAL: 0.8 mg/dL (ref 0.3–1.2)
BUN: 13 mg/dL (ref 6–20)
CO2: 21 mmol/L — ABNORMAL LOW (ref 22–32)
Calcium: 8.3 mg/dL — ABNORMAL LOW (ref 8.9–10.3)
Chloride: 107 mmol/L (ref 98–111)
Creatinine, Ser: 1.1 mg/dL — ABNORMAL HIGH (ref 0.44–1.00)
GFR calc Af Amer: >60 mL/min (ref >60)
Glucose, Bld: 108 mg/dL — ABNORMAL HIGH (ref 70–99)
POTASSIUM: 3.7 mmol/L (ref 3.5–5.1)
Sodium: 136 mmol/L (ref 135–145)
Total Protein: 6.7 g/dL (ref 6.5–8.1)

## 2018-06-25 LAB — TSH: TSH: 4.424 u[IU]/mL (ref 0.350–4.500)

## 2018-06-25 LAB — PHOSPHORUS: Phosphorus: 3.3 mg/dL (ref 2.5–4.6)

## 2018-06-25 MED ORDER — LIDOCAINE HCL 1 % IJ SOLN
INTRAMUSCULAR | Status: AC
  Filled 2018-06-25: qty 20

## 2018-06-25 MED ORDER — CALCIUM CARBONATE ANTACID 500 MG PO CHEW
400.0000 mg | CHEWABLE_TABLET | Freq: Three times a day (TID) | ORAL | Status: DC | PRN
  Filled 2018-06-25: qty 2

## 2018-06-25 NOTE — Progress Notes (Signed)
PROGRESS NOTE  Kim BowerMaria K Pacheco YQI:[id number] DOB: 1986-02-01 DOA: 06/24/2018 PCP: Terressa KoyanagiKim, [name], DO   LOS: 0 days    Brief Narrative / Interim history: 33 year old female with history of hepatitis B, alcohol use disorder, depression and gestational diabetes abdominal pain, constipation, UTI symptoms and urinary retention found to have AKI.  CBC and CMP not impressive except for elevated serum creatinine to 3.1.  UA with pyuria.  Urine pregnancy and HIV negative.  Urine culture negative. CT abdomen and RUQ ultrasound not impressive except for small to moderate volume of abdominal and pelvic ascites and  hepatic steatosis.  Foley catheter placed for urinary retention.  Had enema for constipation.  AKI improved with IV fluid hydration. Placed on CIWA protocol for alcohol withdrawal. Of note, dicyclomine on his medication list which could contribute to urinary retention.  Subjective: Continues to endorse abdominal pain.  She could not describe the nature of her pain.  Husband describes it as cramping.  Also complains about headache that she rates 4/10.  Denies chest pain or dyspnea.  She is somewhat somnolent after pain medications and Ativan for CIWA.  Assessment & Plan: Active Problems:   Hepatitis B   Acute renal failure (ARF) (HCC)   Alcohol abuse   Constipation   Ascites   Acute urinary retention   ARF (acute renal failure) (HCC)  AKI: improved with IV fluid.  Suspect prerenal etiology.  She is not on nephrotoxic medications.  She also had urinary retention. -Continue IV fluids -Recheck renal function in the morning  History of Hepatitis B/mild ascites/abdominal pain/constipation: No leukocytosis or signs of infectious process to suggest SBP.  CT abdomen and RUQ ultrasound not impressive.  CMP not remarkable except for AKI. -Outpatient follow-up for hepatitis B. continue home medication -Bowel regimen -Check ammonia  Urinary retention: Unclear etiology.  She is on  dicyclomine at home which could contribute.  Constipation can contribute as well. -Discontinue Foley in the morning and start voiding trial -Consider Urecholine   Pyuria: UA with nitrite but urine culture on 1/14 negative.  Presentation not convincing for UTI.  Alcohol use disorder -Continue CIWA protocol -Multivitamin and thiamine  -Scheduled Meds: . folic acid  1 mg Oral Daily  . lidocaine      . multivitamin with minerals  1 tablet Oral Daily  . tenofovir  300 mg Oral QPM  . thiamine  100 mg Oral Daily   Or  . thiamine  100 mg Intravenous Daily   Continuous Infusions: PRN Meds:.acetaminophen **OR** acetaminophen, bisacodyl, calcium carbonate, HYDROcodone-acetaminophen, LORazepam **OR** LORazepam, ondansetron **OR** ondansetron (ZOFRAN) IV, polyethylene glycol  DVT prophylaxis: SCD Code Status: Full code Family Communication: Father and husband at bedside Disposition Plan: Remains inpatient.  Anticipate discharge in the next 24 hours.  Consultants:   None  Procedures:   None  Antimicrobials:  None  Objective: Vitals:   06/25/18 1500 06/25/18 1530 06/25/18 1600 06/25/18 1646  BP: 106/70 117/80 112/70 123/73  Pulse: 70 73 71 82  Resp: 15 16 16 16   Temp:    99.4 F (37.4 C)  TempSrc:    Oral  SpO2: 98% 98% 98% 97%  Weight:      Height:        Intake/Output Summary (Last 24 hours) at 06/25/2018 1710 Last data filed at 06/25/2018 1111 Gross per 24 hour  Intake 745.88 ml  Output 1500 ml  Net -754.12 ml   Filed Weights   06/24/18 1722  Weight: 62.1 kg    Examination:  GENERAL: Sleepy but arises easily and responds to questions. HEENT: MMM.  Vision and Hearing grossly intact.  NECK: Supple.  No JVD.  LUNGS:  No IWOB. Good air movement. CTAB.  HEART:  RRR. Heart sounds normal.  ABD: Bowel sounds present. Soft.  Diffuse tenderness to palpation. MSK/EXT:   no edema bilaterally.  SKIN: no apparent skin lesion.  NEURO: Sleepy but arises to voice and  responds to questions appropriately.  Follows command.  No gross deficit. PSYCH: Flat affect   Data Reviewed: I have independently reviewed following labs and imaging studies  CBC: Recent Labs  Lab 06/23/18 1031 06/24/18 1938 06/25/18 0428  WBC 8.7 9.5 8.9  NEUTROABS 6.6 7.0  --   HGB 14.9 14.0 11.5*  HCT 45.2 43.9 36.1  MCV 92.8 93.6 95.3  PLT 212 247 201   Basic Metabolic Panel: Recent Labs  Lab 06/23/18 1031 06/24/18 1938 06/25/18 0428  NA 134* 137 136  K 4.2 4.3 3.7  CL 100 107 107  CO2 23 20* 21*  GLUCOSE 134* 110* 108*  BUN 17 25* 13  CREATININE 1.12* 3.10* 1.10*  CALCIUM 9.5 9.1 8.3*  MG  --   --  2.0  PHOS  --   --  3.3   GFR: Estimated Creatinine Clearance: 59.3 mL/min (A) (by C-G formula based on SCr of 1.1 mg/dL (H)). Liver Function Tests: Recent Labs  Lab 06/23/18 1031 06/24/18 1938 06/25/18 0428  AST 26 19 15   ALT 33 27 18  ALKPHOS 63 66 51  BILITOT 0.9 1.0 0.8  PROT 8.8* 8.9* 6.7  ALBUMIN 4.9 4.8 3.5   Recent Labs  Lab 06/23/18 1031 06/24/18 1938  LIPASE 39 36   No results for input(s): AMMONIA in the last 168 hours. Coagulation Profile: Recent Labs  Lab 06/24/18 1938  INR 0.93   Cardiac Enzymes: No results for input(s): CKTOTAL, CKMB, CKMBINDEX, TROPONINI in the last 168 hours. BNP (last 3 results) No results for input(s): PROBNP in the last 8760 hours. HbA1C: No results for input(s): HGBA1C in the last 72 hours. CBG: No results for input(s): GLUCAP in the last 168 hours. Lipid Profile: No results for input(s): CHOL, HDL, LDLCALC, TRIG, CHOLHDL, LDLDIRECT in the last 72 hours. Thyroid Function Tests: Recent Labs    06/25/18 0428  TSH 4.424   Anemia Panel: No results for input(s): VITAMINB12, FOLATE, FERRITIN, TIBC, IRON, RETICCTPCT in the last 72 hours. Urine analysis:    Component Value Date/Time   COLORURINE AMBER (A) 06/24/2018 1938   APPEARANCEUR CLEAR 06/24/2018 1938   LABSPEC 1.035 (H) 06/24/2018 1938    PHURINE 5.0 06/24/2018 1938   GLUCOSEU NEGATIVE 06/24/2018 1938   HGBUR SMALL (A) 06/24/2018 1938   BILIRUBINUR NEGATIVE 06/24/2018 1938   BILIRUBINUR negative 05/31/2016 1707   KETONESUR NEGATIVE 06/24/2018 1938   PROTEINUR 100 (A) 06/24/2018 1938   UROBILINOGEN 0.2 05/31/2016 1707   NITRITE POSITIVE (A) 06/24/2018 1938   LEUKOCYTESUR NEGATIVE 06/24/2018 1938   Sepsis Labs: Invalid input(s): PROCALCITONIN, LACTICIDVEN  Recent Results (from the past 240 hour(s))  Urine Culture     Status: None   Collection Time: 06/23/18  3:09 PM  Result Value Ref Range Status   Specimen Description   Final    URINE, RANDOM Performed at [facility], [address] [place], [place] [address]    Special Requests   Final    NONE Performed at [facility], [address] [place], [place] [address]  Culture   Final    NO GROWTH Performed at [facility], [address], [place], [place] [address]    Report Status 06/24/2018 FINAL  Final      Radiology Studies: US Abdomen Limited  Result Date: 06/25/2018 CLINICAL DATA:  Abdominal pain, history of hepatitis B. paracentesis requested. EXAM: LIMITED ABDOMEN ULTRASOUND FOR ASCITES TECHNIQUE: Limited ultrasound survey for ascites was performed in all four abdominal quadrants. COMPARISON:  06/24/2018 and previous FINDINGS: Minimal abdominal ascites. No safe pocket for percutaneous paracentesis. IMPRESSION: Minimal ascites.  Paracentesis deferred. Electronically Signed   By: Corlis Leak M.D.   On: 06/25/2018 15:26   US Abdomen Limited Ruq  Result Date: 06/24/2018 CLINICAL DATA:  Right upper quadrant abdominal pain. EXAM: ULTRASOUND ABDOMEN LIMITED RIGHT UPPER QUADRANT COMPARISON:  CT scan of June 23, 2018. Ultrasound of March 18, 2018. FINDINGS: Gallbladder: No gallstones or wall thickening visualized. No sonographic Murphy sign noted by sonographer. Common bile duct: Diameter: 5 mm which is within  normal limits. Liver: No focal lesion identified. Increased echogenicity of hepatic parenchyma is noted suggesting hepatic steatosis. Portal vein is patent on color Doppler imaging with normal direction of blood flow towards the liver. IMPRESSION: Hepatic steatosis. Mild ascites is noted. No other abnormality seen in the right upper quadrant of the abdomen. Electronically Signed   By: Lupita Raider, M.D.   On: 06/24/2018 19:46    Lynde Ludwig T. [facility] Pager [phone]  If 7PM-7AM, please contact night-coverage [email or web address] Password [email or web address] 06/25/2018, 5:10 PM

## 2018-06-25 NOTE — ED Notes (Signed)
ED TO INPATIENT HANDOFF REPORT  Name/Age/Gender Kim Pacheco 33 y.o. female  Code Status    Code Status Orders  (From admission, onward)         Start     Ordered   06/24/18 2330  Full code  Continuous     06/24/18 2330        Code Status History    Date Active Date Inactive Code Status Order ID Comments User Context   11/18/2017 0732 11/19/2017 0302 Full Code [id number]  Leeann Must Inpatient      Home/SNF/Other Home  Chief Complaint abd pain   Level of Care/Admitting Diagnosis ED Disposition    ED Disposition Condition Comment   Admit  Hospital Area: [facility] [[id number]]  Level of Care: Telemetry [5]  Admit to tele based on following criteria: Monitor for Ischemic changes  Diagnosis: ARF (acute renal failure) [facility]) [[id number]]  Admitting Physician: Almon Hercules [[id number]]  Attending Physician: Almon Hercules [id number]  Estimated length of stay: past midnight tomorrow  Certification:: I certify this patient will need inpatient services for at least 2 midnights  PT Class (Do Not Modify): Inpatient [101]  PT Acc Code (Do Not Modify): Private [1]       Medical History Past Medical History:  Diagnosis Date  . Abnormal Pap smear    normal 06/2013  . Depression    suicide attempt in college  . Diabetes mellitus without complication (HCC)    gestational, no issues since pregnancy per her report  . Gestational diabetes    metformin  . Hepatitis    Hep B, managed by Dr. Kinnie Scales  . History of chicken pox   . History of UTI   . Vaginal Pap smear, abnormal     Allergies No Known Allergies  IV Location/Drains/Wounds Patient Lines/Drains/Airways Status   Active Line/Drains/Airways    Name:   Placement date:   Placement time:   Site:   Days:   Peripheral IV 06/24/18 Left Wrist   06/24/18    1941    Wrist   1   Urethral Catheter Leilani Able, RN Straight-tip 14 Fr.   06/24/18    1914    Straight-tip   1   Incision 01/27/13  Abdomen Other (Comment)   01/27/13    0034     1975   Incision (Closed) 11/18/17 Abdomen   11/18/17    2146     219   Incision (Closed) 11/18/17 Vagina   11/18/17    2146     219          Labs/Imaging Results for orders placed or performed during the hospital encounter of 06/24/18 (from the past 48 hour(s))  Brain natriuretic peptide     Status: None   Collection Time: 06/24/18  7:34 PM  Result Value Ref Range   B Natriuretic Peptide 23.4 0.0 - 100.0 pg/mL    Comment: Performed at [facility], [address], [place], [place] [address]  Comprehensive metabolic panel     Status: Abnormal   Collection Time: 06/24/18  7:38 PM  Result Value Ref Range   Sodium 137 135 - 145 mmol/L   Potassium 4.3 3.5 - 5.1 mmol/L   Chloride 107 98 - 111 mmol/L   CO2 20 (L) 22 - 32 mmol/L   Glucose, Bld 110 (H) 70 - 99 mg/dL   BUN 25 (H) 6 - 20 mg/dL   Creatinine, Ser 4.82 (H) 0.44 - 1.00 mg/dL  Comment: DELTA CHECK NOTED REPEATED TO VERIFY    Calcium 9.1 8.9 - 10.3 mg/dL   Total Protein 8.9 (H) 6.5 - 8.1 g/dL   Albumin 4.8 3.5 - 5.0 g/dL   AST 19 15 - 41 U/L   ALT 27 0 - 44 U/L   Alkaline Phosphatase 66 38 - 126 U/L   Total Bilirubin 1.0 0.3 - 1.2 mg/dL   GFR calc non Af Amer 19 (L) >60 mL/min   GFR calc Af Amer 22 (L) >60 mL/min   Anion gap 10 5 - 15    Comment: Performed at [facility], [address] [place], [place] [address]  Lipase, blood     Status: None   Collection Time: 06/24/18  7:38 PM  Result Value Ref Range   Lipase 36 11 - 51 U/L    Comment: Performed at [facility], [address] [place], [place] [address]  CBC with Differential     Status: None   Collection Time: 06/24/18  7:38 PM  Result Value Ref Range   WBC 9.5 4.0 - 10.5 K/uL   RBC 4.69 3.87 - 5.11 MIL/uL   Hemoglobin 14.0 12.0 - 15.0 g/dL   HCT 09.843.9 11.936.0 - 14.746.0 %   MCV 93.6 80.0 - 100.0 fL   MCH 29.9 26.0 - 34.0 pg   MCHC 31.9 30.0 - 36.0 g/dL   RDW  82.913.1 56.211.5 - 13.015.5 %   Platelets 247 150 - 400 K/uL   nRBC 0.0 0.0 - 0.2 %   Neutrophils Relative % 74 %   Neutro Abs 7.0 1.7 - 7.7 K/uL   Lymphocytes Relative 17 %   Lymphs Abs 1.6 0.7 - 4.0 K/uL   Monocytes Relative 8 %   Monocytes Absolute 0.8 0.1 - 1.0 K/uL   Eosinophils Relative 0 %   Eosinophils Absolute 0.0 0.0 - 0.5 K/uL   Basophils Relative 0 %   Basophils Absolute 0.0 0.0 - 0.1 K/uL   Immature Granulocytes 1 %   Abs Immature Granulocytes 0.07 0.00 - 0.07 K/uL    Comment: Performed at [facility], [address] [place], [place] [address]  Protime-INR     Status: None   Collection Time: 06/24/18  7:38 PM  Result Value Ref Range   Prothrombin Time 12.4 11.4 - 15.2 seconds   INR 0.93     Comment: Performed at [facility], [address] [place], [place] [address]  Urinalysis, Routine w reflex microscopic     Status: Abnormal   Collection Time: 06/24/18  7:38 PM  Result Value Ref Range   Color, Urine AMBER (A) YELLOW    Comment: BIOCHEMICALS MAY BE AFFECTED BY COLOR   APPearance CLEAR CLEAR   Specific Gravity, Urine 1.035 (H) 1.005 - 1.030   pH 5.0 5.0 - 8.0   Glucose, UA NEGATIVE NEGATIVE mg/dL   Hgb urine dipstick SMALL (A) NEGATIVE   Bilirubin Urine NEGATIVE NEGATIVE   Ketones, ur NEGATIVE NEGATIVE mg/dL   Protein, ur 952100 (A) NEGATIVE mg/dL   Nitrite POSITIVE (A) NEGATIVE   Leukocytes, UA NEGATIVE NEGATIVE   RBC / HPF 11-20 0 - 5 RBC/hpf   WBC, UA 21-50 0 - 5 WBC/hpf   Bacteria, UA RARE (A) NONE SEEN   Squamous Epithelial / LPF 0-5 0 - 5   Mucus PRESENT     Comment: Performed at [facility], [address] [place], [place] [address]  Pregnancy, urine  Status: None   Collection Time: 06/24/18  7:38 PM  Result Value Ref Range   Preg Test, Ur NEGATIVE NEGATIVE    Comment:        THE SENSITIVITY OF THIS METHODOLOGY IS >20 mIU/mL. Performed at [facility], [address],  [place], [place] [address]   Acetaminophen level     Status: Abnormal   Collection Time: 06/24/18  7:38 PM  Result Value Ref Range   Acetaminophen (Tylenol), Serum <10 (L) 10 - 30 ug/mL    Comment: (NOTE) Therapeutic concentrations vary significantly. A range of 10-30 ug/mL  may be an effective concentration for many patients. However, some  are best treated at concentrations outside of this range. Acetaminophen concentrations >150 ug/mL at 4 hours after ingestion  and >50 ug/mL at 12 hours after ingestion are often associated with  toxic reactions. Performed at [facility], [address] [place], [place] [address]   Ethanol     Status: None   Collection Time: 06/24/18  7:38 PM  Result Value Ref Range   Alcohol, Ethyl (B) <10 <10 mg/dL    Comment: (NOTE) Lowest detectable limit for serum alcohol is 10 mg/dL. For medical purposes only. Performed at [facility], [address], [place], [place] [address]   Urine rapid drug screen (hosp performed)     Status: Abnormal   Collection Time: 06/24/18  7:38 PM  Result Value Ref Range   Opiates POSITIVE (A) NONE DETECTED   Cocaine NONE DETECTED NONE DETECTED   Benzodiazepines NONE DETECTED NONE DETECTED   Amphetamines NONE DETECTED NONE DETECTED   Tetrahydrocannabinol NONE DETECTED NONE DETECTED   Barbiturates NONE DETECTED NONE DETECTED    Comment: (NOTE) DRUG SCREEN FOR MEDICAL PURPOSES ONLY.  IF CONFIRMATION IS NEEDED FOR ANY PURPOSE, NOTIFY LAB WITHIN 5 DAYS. LOWEST DETECTABLE LIMITS FOR URINE DRUG SCREEN Drug Class                     Cutoff (ng/mL) Amphetamine and metabolites    1000 Barbiturate and metabolites    200 Benzodiazepine                 200 Tricyclics and metabolites     300 Opiates and metabolites        300 Cocaine and metabolites        300 THC                            50 Performed at [facility], [address], [place], [place] [address]   TSH      Status: None   Collection Time: 06/24/18  7:38 PM  Result Value Ref Range   TSH 2.005 0.350 - 4.500 uIU/mL    Comment: Performed by a 3rd Generation assay with a functional sensitivity of <=0.01 uIU/mL. Performed at [facility], [address] [place], [place] [address]   Creatinine, urine, random     Status: None   Collection Time: 06/24/18  7:38 PM  Result Value Ref Range   Creatinine, Urine 174.89 mg/dL    Comment: Performed at [facility], [address] [place], [place] [address]  Sodium, urine, random     Status: None   Collection Time: 06/24/18  7:38 PM  Result Value Ref Range   Sodium, Ur 53 mmol/L    Comment: Performed at [facility], [address], [place], [place] [address]  HIV antibody (Routine Testing)     Status: None   Collection Time: 06/25/18  4:28 AM  Result Value Ref Range   HIV Screen 4th Generation wRfx Non Reactive Non Reactive    Comment: (NOTE) Performed At: [facility] [address] [place], [place] [id number] Jolene Schimke MD SH:[id number]   Magnesium     Status: None   Collection Time: 06/25/18  4:28 AM  Result Value Ref Range   Magnesium 2.0 1.7 - 2.4 mg/dL    Comment: Performed at [facility], [address], [place], [place] [address]  Phosphorus     Status: None   Collection Time: 06/25/18  4:28 AM  Result Value Ref Range   Phosphorus 3.3 2.5 - 4.6 mg/dL    Comment: Performed at [facility], [address] [place], [place] [address]  TSH     Status: None   Collection Time: 06/25/18  4:28 AM  Result Value Ref Range   TSH 4.424 0.350 - 4.500 uIU/mL    Comment: Performed by a 3rd Generation assay with a functional sensitivity of <=0.01 uIU/mL. Performed at [facility], [address], [place], [place] [address]   Comprehensive metabolic panel     Status: Abnormal   Collection Time: 06/25/18  4:28 AM  Result  Value Ref Range   Sodium 136 135 - 145 mmol/L   Potassium 3.7 3.5 - 5.1 mmol/L   Chloride 107 98 - 111 mmol/L   CO2 21 (L) 22 - 32 mmol/L   Glucose, Bld 108 (H) 70 - 99 mg/dL   BUN 13 6 - 20 mg/dL   Creatinine, Ser 2.09 (H) 0.44 - 1.00 mg/dL    Comment: DELTA CHECK NOTED REPEATED TO VERIFY    Calcium 8.3 (L) 8.9 - 10.3 mg/dL   Total Protein 6.7 6.5 - 8.1 g/dL   Albumin 3.5 3.5 - 5.0 g/dL   AST 15 15 - 41 U/L   ALT 18 0 - 44 U/L   Alkaline Phosphatase 51 38 - 126 U/L   Total Bilirubin 0.8 0.3 - 1.2 mg/dL   GFR calc non Af Amer >60 >60 mL/min   GFR calc Af Amer >60 >60 mL/min   Anion gap 8 5 - 15    Comment: Performed at [facility], [address] [place] [address] [place], [place] [address]  CBC     Status: Abnormal   Collection Time: 06/25/18  4:28 AM  Result Value Ref Range   WBC 8.9 4.0 - 10.5 K/uL   RBC 3.79 (L) 3.87 - 5.11 MIL/uL   Hemoglobin 11.5 (L) 12.0 - 15.0 g/dL   HCT 28.3 66.2 - 94.7 %   MCV 95.3 80.0 - 100.0 fL   MCH 30.3 26.0 - 34.0 pg   MCHC 31.9 30.0 - 36.0 g/dL   RDW 65.4 65.0 - 35.4 %   Platelets 201 150 - 400 K/uL   nRBC 0.0 0.0 - 0.2 %    Comment: Performed at [facility], [address] [place], [place] [address]   Ct Abdomen Pelvis W Contrast  Result Date: 06/23/2018 CLINICAL DATA:  Generalized abdominal pain for several days. EXAM: CT ABDOMEN AND PELVIS WITH CONTRAST TECHNIQUE: Multidetector CT imaging of the abdomen and pelvis was performed using the standard protocol following bolus administration of intravenous contrast. CONTRAST:  100 mL ISOVUE-300 IOPAMIDOL (ISOVUE-300) INJECTION 61% COMPARISON:  CT abdomen and pelvis without contrast earlier today.  CT abdomen and pelvis 05/25/2018. FINDINGS: Lower chest: Heart size normal. No pleural or pericardial effusion. Mild dependent atelectasis noted. Hepatobiliary: Diffuse fatty infiltration of the liver is seen. No focal liver lesion. The gallbladder and biliary tree appear normal.  Pancreas: Unremarkable. No pancreatic ductal dilatation or surrounding inflammatory changes. Spleen: Normal in size without focal abnormality. Adrenals/Urinary Tract: Adrenal glands are unremarkable. Kidneys are normal, without renal calculi, focal lesion, or hydronephrosis. Bladder is unremarkable. Stomach/Bowel: Stomach is within normal limits. Appendix appears normal. No evidence of bowel wall thickening, distention, or inflammatory changes. Vascular/Lymphatic: No significant vascular findings are present. No enlarged abdominal or pelvic lymph nodes. Duplicated inferior vena cava is noted. Reproductive: Uterus and bilateral adnexa are unremarkable. Other: Small to moderate volume of abdominal and pelvic ascites is seen as on the study earlier today. Cause for ascites is not identified. Musculoskeletal: Normal. IMPRESSION: Small to moderate volume of abdominal and pelvic ascites is seen as on the examination earlier today. Cause for this finding is not identified. Fatty infiltration of the liver. Electronically Signed   By: Drusilla Kannerhomas  Dalessio M.D.   On: 06/23/2018 17:30   [place] Abdomen Limited  Result Date: 06/25/2018 CLINICAL DATA:  Abdominal pain, history of hepatitis B. paracentesis requested. EXAM: LIMITED ABDOMEN ULTRASOUND FOR ASCITES TECHNIQUE: Limited ultrasound survey for ascites was performed in all four abdominal quadrants. COMPARISON:  06/24/2018 and previous FINDINGS: Minimal abdominal ascites. No safe pocket for percutaneous paracentesis. IMPRESSION: Minimal ascites.  Paracentesis deferred. Electronically Signed   By: Corlis Leak  Hassell M.D.   On: 06/25/2018 15:26   [place] Abdomen Limited Ruq  Result Date: 06/24/2018 CLINICAL DATA:  Right upper quadrant abdominal pain. EXAM: ULTRASOUND ABDOMEN LIMITED RIGHT UPPER QUADRANT COMPARISON:  CT scan of June 23, 2018. Ultrasound of March 18, 2018. FINDINGS: Gallbladder: No gallstones or wall thickening visualized. No sonographic Murphy sign noted by sonographer.  Common bile duct: Diameter: 5 mm which is within normal limits. Liver: No focal lesion identified. Increased echogenicity of hepatic parenchyma is noted suggesting hepatic steatosis. Portal vein is patent on color Doppler imaging with normal direction of blood flow towards the liver. IMPRESSION: Hepatic steatosis. Mild ascites is noted. No other abnormality seen in the right upper quadrant of the abdomen. Electronically Signed   By: Lupita RaiderJames  Green Jr, M.D.   On: 06/24/2018 19:46   None  Pending Labs Unresulted Labs (From admission, onward)    Start     Ordered   06/25/18 0500  Hepatitis B DNA, Ultraquantitative, PCR  Tomorrow morning,   R     06/24/18 2309   06/24/18 2147  Hepatitis panel, acute  Tomorrow morning,   R     06/24/18 2146          Vitals/Pain Today's Vitals   06/25/18 1330 06/25/18 1430 06/25/18 1500 06/25/18 1530  BP: 114/71 113/68 106/70 117/80  Pulse: 80 76 70 73  Resp: 17 17 15 16   Temp:      TempSrc:      SpO2: 97% 97% 98% 98%  Weight:      Height:      PainSc:        Isolation Precautions No active isolations  Medications Medications  tenofovir (VIREAD) tablet 300 mg (has no administration in time range)  acetaminophen (TYLENOL) tablet 650 mg (has no administration in time range)    Or  acetaminophen (TYLENOL) suppository 650 mg (has no administration in time range)  HYDROcodone-acetaminophen (NORCO/VICODIN) 5-325 MG per tablet 1-2 tablet (2 tablets Oral Given  06/25/18 0931)  ondansetron (ZOFRAN) tablet 4 mg ( Oral See Alternative 06/25/18 1116)    Or  ondansetron (ZOFRAN) injection 4 mg (4 mg Intravenous Given 06/25/18 1116)  0.9 %  sodium chloride infusion ( Intravenous Stopped 06/25/18 1111)  polyethylene glycol (MIRALAX / GLYCOLAX) packet 17 g (has no administration in time range)  bisacodyl (DULCOLAX) suppository 10 mg (has no administration in time range)  LORazepam (ATIVAN) tablet 1 mg ( Oral See Alternative 06/25/18 1116)    Or  LORazepam (ATIVAN)  injection 1 mg (1 mg Intravenous Given 06/25/18 1116)  thiamine (VITAMIN B-1) tablet 100 mg (100 mg Oral Given 06/25/18 0930)    Or  thiamine (B-1) injection 100 mg ( Intravenous See Alternative 06/25/18 0930)  folic acid (FOLVITE) tablet 1 mg (1 mg Oral Given 06/25/18 0930)  multivitamin with minerals tablet 1 tablet (1 tablet Oral Given 06/25/18 0930)  calcium carbonate (TUMS - dosed in mg elemental calcium) chewable tablet 400 mg of elemental calcium (has no administration in time range)  lidocaine (XYLOCAINE) 1 % (with pres) injection (has no administration in time range)  morphine 4 MG/ML injection 4 mg (4 mg Intravenous Given 06/24/18 1941)  milk and molasses enema (250 mLs Rectal Given 06/25/18 0046)    Mobility walks with person assist

## 2018-06-25 NOTE — ED Notes (Signed)
Pt returned from Ultrasound.

## 2018-06-25 NOTE — Progress Notes (Signed)
Patient ID: Kim Pacheco, female   DOB: 09/29/1985, 33 y.o.   MRN: [id number] Patient presented to ultrasound department today for paracentesis.  On limited ultrasound of abdomen in all 4 quadrants there is only a minimal amount of ascites present, not safely accessible for aspiration at this time due to close proximity to bowel loops.  Procedure canceled.  Patient notified.

## 2018-06-25 NOTE — ED Notes (Signed)
Patient requesting some tums/ antacids, MD notified.

## 2018-06-25 NOTE — ED Notes (Signed)
Patient complaining of migraine headache 10/10 pain.

## 2018-06-25 NOTE — ED Notes (Signed)
Pt to ultrasound

## 2018-06-25 NOTE — ED Notes (Signed)
Patient complaining of continued migraine headache, and new onset nausea.

## 2018-06-26 ENCOUNTER — Inpatient Hospital Stay (HOSPITAL_COMMUNITY): Payer: BLUE CROSS/BLUE SHIELD

## 2018-06-26 LAB — COMPREHENSIVE METABOLIC PANEL
ALT: 17 U/L (ref 0–44)
AST: 17 U/L (ref 15–41)
Albumin: 3.3 g/dL — ABNORMAL LOW (ref 3.5–5.0)
Alkaline Phosphatase: 46 U/L (ref 38–126)
Anion gap: 7 (ref 5–15)
BUN: <5 mg/dL — ABNORMAL LOW (ref 6–20)
CO2: 25 mmol/L (ref 22–32)
CREATININE: 0.6 mg/dL (ref 0.44–1.00)
Calcium: 8.1 mg/dL — ABNORMAL LOW (ref 8.9–10.3)
Chloride: 106 mmol/L (ref 98–111)
GFR calc Af Amer: >60 mL/min (ref >60)
GFR calc non Af Amer: >60 mL/min (ref >60)
Glucose, Bld: 100 mg/dL — ABNORMAL HIGH (ref 70–99)
Potassium: 3.6 mmol/L (ref 3.5–5.1)
Sodium: 138 mmol/L (ref 135–145)
Total Bilirubin: 1 mg/dL (ref 0.3–1.2)
Total Protein: 6.4 g/dL — ABNORMAL LOW (ref 6.5–8.1)

## 2018-06-26 LAB — HEPATITIS PANEL, ACUTE
HCV Ab: <0.1 s/co ratio (ref 0.0–0.9)
Hep A IgM: NEGATIVE
Hep B C IgM: NEGATIVE
Hepatitis B Surface Ag: POSITIVE — AB

## 2018-06-26 LAB — AMMONIA: Ammonia: 33 umol/L (ref 9–35)

## 2018-06-26 MED ORDER — OXYCODONE HCL 5 MG PO TABS: 5.0000 mg | ORAL_TABLET | Freq: Four times a day (QID) | ORAL | 0 refills | Status: AC | PRN

## 2018-06-26 NOTE — Progress Notes (Signed)
Patient back from CT scan, C/O more pelvis/abd pain, PRN pain med given and Dr. Nelson Chimes notified again and spoke to the patient of CT scan result.

## 2018-06-26 NOTE — Discharge Summary (Signed)
Physician Discharge Summary  Kim Pacheco WLN:[id number] DOB: 07-15-1985 DOA: 06/24/2018  PCP: [name], DO  Admit date: 06/24/2018 Discharge date: 06/26/2018  Admitted From: Home Disposition: Home  Recommendations for Outpatient Follow-up:  1. Follow up with PCP in 1-2 weeks 2. Please obtain BMP/CBC in one week your next doctors visit.  3. Advised to quit drinking alcohol.  Advised to hydrate herself orally.  Home Health: None Equipment/Devices: None Discharge Condition: Stable CODE STATUS: Full code Diet recommendation: Regular  Brief/Interim Summary: 33 year old female with history of hepatitis B, alcohol use disorder, gestational diabetes, depression came to the hospital with complaints of abdominal discomfort.  Her labs were mostly unremarkable except elevated creatinine of 3.1 which is up from her baseline of 0.8.  CT of the abdomen pelvis and right upper quadrant ultrasound was negative except some abdominal and pelvic ascites and hepatic steatosis.  Foley catheter was placed for fluid retention and a creatinine started improving.  She was also given IV fluids.  During the hospitalization she was watched for any alcohol withdrawal symptoms. Over couple of days her creatinine trended down to baseline of 0.8.  Foley catheter was removed and she was urinating without any issues. Today she is reached maximal benefit from hospital stay and stable for discharge with outpatient follow-up recommendations as stated above   Discharge Diagnoses:  Active Problems:   Hepatitis B   Acute renal failure (ARF) (HCC)   Alcohol abuse   Constipation   Ascites   Acute urinary retention   ARF (acute renal failure) (HCC)  Acute kidney injury secondary to urinary retention, resolved -Creatinine trended up to 3.1.  This is trended down with IV fluid and placement of Foley catheter.  Foley catheter removed yesterday evening and she is urinating without any issues.  Advised her to  continue oral hydration at home and follow-up with outpatient primary care provider.  Alcohol use and hepatitis B -CT of the abdomen pelvis and right upper quadrant ultrasound is mainly unremarkable.  She needs to follow-up outpatient with primary care provider.  Advised to quit drinking alcohol.  Ascites -Not enough to perform paracentesis.  Currently it is not causing her any symptoms including shortness of breath, abdominal distention, nausea or vomiting.  On SCDs for DVT prophylaxis during hospitalization Full code Husband at bedside at the time of discharge.  Discharge Instructions   Allergies as of 06/26/2018   No Known Allergies     Medication List    TAKE these medications   dicyclomine 20 MG tablet Commonly known as:  BENTYL Take 1 tablet (20 mg total) by mouth 4 (four) times daily -  before meals and at bedtime.   HEATHER 0.35 MG tablet Generic drug:  norethindrone Take 1 tablet by mouth every evening.   oxyCODONE 5 MG immediate release tablet Commonly known as:  Oxy IR/ROXICODONE Take 1 tablet (5 mg total) by mouth every 6 (six) hours as needed for severe pain.   tenofovir 300 MG tablet Commonly known as:  VIREAD Take 300 mg by mouth every evening.      Follow-up Information    [name], DO. Schedule an appointment as soon as possible for a visit in 2 week(s).   Specialty:  Family Medicine Contact information: [address] [place] [address] [phone]          No Known Allergies  You were cared for by a hospitalist during your hospital stay. If you have any questions about your discharge medications  or the care you received while you were in the hospital after you are discharged, you can call the unit and asked to speak with the hospitalist on call if the hospitalist that took care of you is not available. Once you are discharged, your primary care physician will handle any further medical issues. Please note that no refills for any  discharge medications will be authorized once you are discharged, as it is imperative that you return to your primary care physician (or establish a relationship with a primary care physician if you do not have one) for your aftercare needs so that they can reassess your need for medications and monitor your lab values.  Consultations:  None  Procedures/Studies: Ct Abdomen Pelvis W Contrast  Result Date: 06/23/2018 CLINICAL DATA:  Generalized abdominal pain for several days. EXAM: CT ABDOMEN AND PELVIS WITH CONTRAST TECHNIQUE: Multidetector CT imaging of the abdomen and pelvis was performed using the standard protocol following bolus administration of intravenous contrast. CONTRAST:  100 mL ISOVUE-300 IOPAMIDOL (ISOVUE-300) INJECTION 61% COMPARISON:  CT abdomen and pelvis without contrast earlier today. CT abdomen and pelvis 05/25/2018. FINDINGS: Lower chest: Heart size normal. No pleural or pericardial effusion. Mild dependent atelectasis noted. Hepatobiliary: Diffuse fatty infiltration of the liver is seen. No focal liver lesion. The gallbladder and biliary tree appear normal. Pancreas: Unremarkable. No pancreatic ductal dilatation or surrounding inflammatory changes. Spleen: Normal in size without focal abnormality. Adrenals/Urinary Tract: Adrenal glands are unremarkable. Kidneys are normal, without renal calculi, focal lesion, or hydronephrosis. Bladder is unremarkable. Stomach/Bowel: Stomach is within normal limits. Appendix appears normal. No evidence of bowel wall thickening, distention, or inflammatory changes. Vascular/Lymphatic: No significant vascular findings are present. No enlarged abdominal or pelvic lymph nodes. Duplicated inferior vena cava is noted. Reproductive: Uterus and bilateral adnexa are unremarkable. Other: Small to moderate volume of abdominal and pelvic ascites is seen as on the study earlier today. Cause for ascites is not identified. Musculoskeletal: Normal. IMPRESSION: Small  to moderate volume of abdominal and pelvic ascites is seen as on the examination earlier today. Cause for this finding is not identified. Fatty infiltration of the liver. Electronically Signed   By: Drusilla Kannerhomas  Dalessio M.D.   On: 06/23/2018 17:30   [place] Abdomen Limited  Result Date: 06/25/2018 CLINICAL DATA:  Abdominal pain, history of hepatitis B. paracentesis requested. EXAM: LIMITED ABDOMEN ULTRASOUND FOR ASCITES TECHNIQUE: Limited ultrasound survey for ascites was performed in all four abdominal quadrants. COMPARISON:  06/24/2018 and previous FINDINGS: Minimal abdominal ascites. No safe pocket for percutaneous paracentesis. IMPRESSION: Minimal ascites.  Paracentesis deferred. Electronically Signed   By: Corlis Leak  Hassell M.D.   On: 06/25/2018 15:26   Ct Renal Stone Study  Result Date: 06/23/2018 CLINICAL DATA:  Generalized abdominal pain. EXAM: CT ABDOMEN AND PELVIS WITHOUT CONTRAST TECHNIQUE: Multidetector CT imaging of the abdomen and pelvis was performed following the standard protocol without IV contrast. COMPARISON:  CT scan of May 25, 2018. FINDINGS: Lower chest: No acute abnormality. Hepatobiliary: No gallstones or biliary dilatation is noted. Hepatic steatosis is noted. Pancreas: Unremarkable. No pancreatic ductal dilatation or surrounding inflammatory changes. Spleen: Normal in size without focal abnormality. Adrenals/Urinary Tract: Adrenal glands are unremarkable. Kidneys are normal, without renal calculi, focal lesion, or hydronephrosis. Bladder is unremarkable. Stomach/Bowel: Stomach is within normal limits. Appendix appears normal. No evidence of bowel wall thickening, distention, or inflammatory changes. Vascular/Lymphatic: No significant vascular findings are present. No enlarged abdominal or pelvic lymph nodes. Reproductive: Uterus and bilateral adnexa are unremarkable. Other: Mild ascites  is noted. Musculoskeletal: No acute or significant osseous findings. IMPRESSION: Mild ascites. Hepatic  steatosis. No other abnormality seen in the abdomen or pelvis. Electronically Signed   By: Lupita RaiderJames  Green Jr, M.D.   On: 06/23/2018 13:33   [place] Abdomen Limited Ruq  Result Date: 06/24/2018 CLINICAL DATA:  Right upper quadrant abdominal pain. EXAM: ULTRASOUND ABDOMEN LIMITED RIGHT UPPER QUADRANT COMPARISON:  CT scan of June 23, 2018. Ultrasound of March 18, 2018. FINDINGS: Gallbladder: No gallstones or wall thickening visualized. No sonographic Murphy sign noted by sonographer. Common bile duct: Diameter: 5 mm which is within normal limits. Liver: No focal lesion identified. Increased echogenicity of hepatic parenchyma is noted suggesting hepatic steatosis. Portal vein is patent on color Doppler imaging with normal direction of blood flow towards the liver. IMPRESSION: Hepatic steatosis. Mild ascites is noted. No other abnormality seen in the right upper quadrant of the abdomen. Electronically Signed   By: Lupita RaiderJames  Green Jr, M.D.   On: 06/24/2018 19:46      Subjective: Feels better, no complaints.  Foley catheter removed yesterday evening and since then she has been urinating without any issues.   Discharge Exam: Vitals:   06/26/18 0555 06/26/18 0600  BP: 113/80 113/80  Pulse: 66 66  Resp: 16   Temp: 98.4 F (36.9 C)   SpO2: 100%    Vitals:   06/26/18 0017 06/26/18 0437 06/26/18 0555 06/26/18 0600  BP: 106/66 107/62 113/80 113/80  Pulse: 70 (!) 58 66 66  Resp: 16 16 16    Temp: 98.9 F (37.2 C) 98.7 F (37.1 C) 98.4 F (36.9 C)   TempSrc: Oral Oral Oral   SpO2: 97% 97% 100%   Weight:      Height:        General: Pt is alert, awake, not in acute distress Cardiovascular: RRR, S1/S2 +, no rubs, no gallops Respiratory: CTA bilaterally, no wheezing, no rhonchi Abdominal: Soft, NT, ND, bowel sounds + Extremities: no edema, no cyanosis    The results of significant diagnostics from this hospitalization (including imaging, microbiology, ancillary and laboratory) are listed below for  reference.     Microbiology: Recent Results (from the past 240 hour(s))  Urine Culture     Status: None   Collection Time: 06/23/18  3:09 PM  Result Value Ref Range Status   Specimen Description   Final    URINE, RANDOM Performed at [facility], [address] [place], [place] [address]    Special Requests   Final    NONE Performed at [facility], [address] [place], [place] [address]    Culture   Final    NO GROWTH Performed at [facility], [address] [place], [place] [address]    Report Status 06/24/2018 FINAL  Final     Labs: BNP (last 3 results) Recent Labs    06/24/18 1934  BNP 23.4   Basic Metabolic Panel: Recent Labs  Lab 06/23/18 1031 06/24/18 1938 06/25/18 0428 06/26/18 0531  NA 134* 137 136 138  K 4.2 4.3 3.7 3.6  CL 100 107 107 106  CO2 23 20* 21* 25  GLUCOSE 134* 110* 108* 100*  BUN 17 25* 13 <5*  CREATININE 1.12* 3.10* 1.10* 0.60  CALCIUM 9.5 9.1 8.3* 8.1*  MG  --   --  2.0  --   PHOS  --   --  3.3  --    Liver Function Tests: Recent Labs  Lab 06/23/18 1031  06/24/18 1938 06/25/18 0428 06/26/18 0531  AST 26 19 15 17   ALT 33 27 18 17   ALKPHOS 63 66 51 46  BILITOT 0.9 1.0 0.8 1.0  PROT 8.8* 8.9* 6.7 6.4*  ALBUMIN 4.9 4.8 3.5 3.3*   Recent Labs  Lab 06/23/18 1031 06/24/18 1938  LIPASE 39 36   Recent Labs  Lab 06/26/18 0531  AMMONIA 33   CBC: Recent Labs  Lab 06/23/18 1031 06/24/18 1938 06/25/18 0428  WBC 8.7 9.5 8.9  NEUTROABS 6.6 7.0  --   HGB 14.9 14.0 11.5*  HCT 45.2 43.9 36.1  MCV 92.8 93.6 95.3  PLT 212 247 201   Cardiac Enzymes: No results for input(s): CKTOTAL, CKMB, CKMBINDEX, TROPONINI in the last 168 hours. BNP: Invalid input(s): POCBNP CBG: No results for input(s): GLUCAP in the last 168 hours. D-Dimer No results for input(s): DDIMER in the last 72 hours. Hgb A1c No results for input(s): HGBA1C in the last 72 hours. Lipid Profile No results for  input(s): CHOL, HDL, LDLCALC, TRIG, CHOLHDL, LDLDIRECT in the last 72 hours. Thyroid function studies Recent Labs    06/25/18 0428  TSH 4.424   Anemia work up No results for input(s): VITAMINB12, FOLATE, FERRITIN, TIBC, IRON, RETICCTPCT in the last 72 hours. Urinalysis    Component Value Date/Time   COLORURINE AMBER (A) 06/24/2018 1938   APPEARANCEUR CLEAR 06/24/2018 1938   LABSPEC 1.035 (H) 06/24/2018 1938   PHURINE 5.0 06/24/2018 1938   GLUCOSEU NEGATIVE 06/24/2018 1938   HGBUR SMALL (A) 06/24/2018 1938   BILIRUBINUR NEGATIVE 06/24/2018 1938   BILIRUBINUR negative 05/31/2016 1707   KETONESUR NEGATIVE 06/24/2018 1938   PROTEINUR 100 (A) 06/24/2018 1938   UROBILINOGEN 0.2 05/31/2016 1707   NITRITE POSITIVE (A) 06/24/2018 1938   LEUKOCYTESUR NEGATIVE 06/24/2018 1938   Sepsis Labs Invalid input(s): PROCALCITONIN,  WBC,  LACTICIDVEN Microbiology Recent Results (from the past 240 hour(s))  Urine Culture     Status: None   Collection Time: 06/23/18  3:09 PM  Result Value Ref Range Status   Specimen Description   Final    URINE, RANDOM Performed at [facility], [address] [place], [place] [address]    Special Requests   Final    NONE Performed at [facility], [address], [place], [place] [address]    Culture   Final    NO GROWTH Performed at [facility], [address], [place], [place] [address]    Report Status 06/24/2018 FINAL  Final     Time coordinating discharge:  I have spent 35 minutes face to face with the patient and on the ward discussing the patients care, assessment, plan and disposition with other care givers. >50% of the time was devoted counseling the patient about the risks and benefits of treatment/Discharge disposition and coordinating care.   SIGNED:   Dimple Nanas, MD  [facility] 06/26/2018, 11:37 AM   If 7PM-7AM, please contact night-coverage [email or web address]

## 2018-06-26 NOTE — Progress Notes (Signed)
Repeat CT shows Stable small to mod vol ascites, hepatic steatosis. Ascites itself doesn't need to be causing any discomfort.  Discussed with the patient, she will follow up with PCP and get GI referral.   Discharge in stable condition at this time.

## 2018-06-26 NOTE — Progress Notes (Signed)
Patient decided she is ready to go home after talking to Dr. Nelson Chimes. Discharge instructions given and explained to patient, she verbalized understanding, No wound noted, skin intact. Waiting on her husband to pick her up.

## 2018-06-26 NOTE — Progress Notes (Signed)
Back in the room again to discharge patient, patient/husband concern about patient being discharged, patient have urinated since the last time but still having pain. Dr. Nelson Chimes notified again the second time and have him talk to patient/husband, ordered repeat CT scan, patient/husband aware.

## 2018-06-26 NOTE — Progress Notes (Signed)
Patient discharged home with husband, accompanied home by husband, patient denies any distress.

## 2018-06-26 NOTE — Progress Notes (Signed)
In the room to discharge patient she stated he has to urinate first, stayed in the [facility] for awhile and stated she is unable to void, she has urinated 3x since the foley was taken out at 5am, also C/O abd pain, bladder scan done showed 178. Dr. Nelson Chimes notified, stated it's okay to discharge patient and to F/U outpatient with PCP; patient/husband updated, patient wanted me to come back in few minutes.

## 2018-06-26 NOTE — Care Management Note (Signed)
Case Management Note  Patient Details  Name: LAI SAESEE MRN: [id number] Date of Birth: 1986/01/19  Subjective/Objective: ARF.From home. No CM needs.                  Action/Plan:dc home.   Expected Discharge Date:  06/26/18               Expected Discharge Plan:  Home/Self Care  In-House Referral:     Discharge planning Services  CM Consult  Post Acute Care Choice:    Choice offered to:     DME Arranged:    DME Agency:     HH Arranged:    HH Agency:     Status of Service:  Completed, signed off  If discussed at [employer] of Stay Meetings, dates discussed:    Additional Comments:  Lanier Clam, RN 06/26/2018, 12:16 PM

## 2018-06-26 NOTE — Progress Notes (Signed)
Patient stated she is allergic to IV contrast, Dr. Nelson Chimes notified and will change it to PO contrast.

## 2018-06-28 LAB — HEPATITIS B DNA, ULTRAQUANTITATIVE, PCR
HBV DNA SERPL PCR-ACNC: 4890000 [IU]/mL
HBV DNA SERPL PCR-LOG IU: 6.689 log10 IU/mL

## 2018-06-29 DIAGNOSIS — R109 Unspecified abdominal pain: Secondary | ICD-10-CM | POA: Diagnosis not present

## 2018-06-29 DIAGNOSIS — B181 Chronic viral hepatitis B without delta-agent: Secondary | ICD-10-CM | POA: Diagnosis not present

## 2018-06-29 DIAGNOSIS — Z7289 Other problems related to lifestyle: Secondary | ICD-10-CM | POA: Diagnosis not present

## 2018-07-02 ENCOUNTER — Ambulatory Visit: Payer: BLUE CROSS/BLUE SHIELD | Admitting: Family Medicine

## 2018-07-02 ENCOUNTER — Encounter: Payer: Self-pay | Admitting: Family Medicine

## 2018-07-02 ENCOUNTER — Encounter

## 2018-07-02 VITALS — BP 108/60 | HR 68 | Temp 98.3°F | Ht 60.0 in | Wt 134.4 lb

## 2018-07-02 DIAGNOSIS — B1910 Unspecified viral hepatitis B without hepatic coma: Secondary | ICD-10-CM

## 2018-07-02 DIAGNOSIS — F1010 Alcohol abuse, uncomplicated: Secondary | ICD-10-CM | POA: Diagnosis not present

## 2018-07-02 DIAGNOSIS — R188 Other ascites: Secondary | ICD-10-CM

## 2018-07-02 DIAGNOSIS — N179 Acute kidney failure, unspecified: Secondary | ICD-10-CM | POA: Diagnosis not present

## 2018-07-02 DIAGNOSIS — R1084 Generalized abdominal pain: Secondary | ICD-10-CM

## 2018-07-02 DIAGNOSIS — F339 Major depressive disorder, recurrent, unspecified: Secondary | ICD-10-CM

## 2018-07-02 NOTE — Patient Instructions (Addendum)
BEFORE YOU LEAVE: -phq9 in epic -information about alcohol rehab -lab -follow up: 2-3 months  Continue the mirilax once daily for 3-4 more day until cleaned out, then back down to 1/2 cap daily for 1 week. They use as needed to keep bowels soft and regular.   Please call and schedule counseling today for help with depression. See emergency care if worsening or thoughts of harm or severe symptoms.  Continue follow up with your liver specialist about the liver disease, ascites and hepatitis.  Do not drink alcohol. See help for this if you are not about to stop/refrain on your own.  -We placed a referral for you as discussed to the gastroenterologist about the abdominal pain and to the psychiatrist about the depression and the history of alcohol use. It usually takes about 1-2 weeks to process and schedule this referral. If you have not heard from [place] regarding this appointment in 2 weeks please contact our office.  I hope you are feeling better soon! Seek care promptly if your symptoms worsen, new concerns arise or you are not improving with treatment.

## 2018-07-02 NOTE — Progress Notes (Signed)
HPI:  Using dictation device. Unfortunately this device frequently misinterprets words/phrases.  Kim Pacheco is a pleasant 33 y.o. with a PMH significant for Hepatitis B managed by GI with poor compliance with care, alcohol use, hx gestational diabetes and hx of depression per hospital notes here for a hospital follow up. See transitional care phone note in Epic. Per review of discharge documents and patient: Hospitalized 1/15 to 06/26/18 Primary admitting complaint(s): abdominal pain, fluid retention Primary admitting diagnosis (es) and treatment: fluid retention, AKI - resolved with foley, IVFs. Had ct and [place] abd unremarkable per notes but per CT report has hepatic steatosis, tr L pleural effusion, stable small to moderate peritoneal ascites (per review hospital notes too small to tap) - alcohol cessation advised.  -per review hepatology notes they restarted antiviral, advised and counseled on alcohol, are monitoring ascites and do not feel her abd pain if from the liver - they recommended GI eval for the as has had constipation and advised mirilax. Reports today: here with husband and reports doing better with abd pain improving since having BM - now more regular since today with loose stool today - has been doing mirilax bid per hepatologist recommendation. No fevers, malaise, hematochezia, vomiting. She does admit to struggling with ppd for some time. Was doing counseling but had stopped. Had been drinking a fair amount of wine. Has not been drinking at all since hospitalizations, but admits wishes she could. Denies severe symptoms, SI or thoughts of harm   ROS: See pertinent positives and negatives per HPI.  Past Medical History:  Diagnosis Date  . Abnormal Pap smear    normal 06/2013  . Depression    suicide attempt in college  . Diabetes mellitus without complication (HCC)    gestational, no issues since pregnancy per her report  . Gestational diabetes    metformin  .  Hepatitis    Hep B, managed by Dr. Kinnie Scales  . History of chicken pox   . History of UTI   . Vaginal Pap smear, abnormal     Past Surgical History:  Procedure Laterality Date  . CESAREAN SECTION N/A 01/26/2013   Procedure: Primary Cesarean Section Delivery Baby  Boy @ 0017, Apgars 4/8/9;  Surgeon: Freddrick March. Tenny Craw, MD;  Location: [facility];  Service: Obstetrics;  Laterality: N/A;  . CESAREAN SECTION N/A 11/18/2017   Procedure: CESAREAN SECTION;  Surgeon: Olivia Mackie, MD;  Location: [facility];  Service: Obstetrics;  Laterality: N/A;  . COLPOSCOPY    . NASAL SINUS SURGERY      Family History  Adopted: Yes  Family history unknown: Yes    SOCIAL HX: see hpi   Current Outpatient Medications:  .  norethindrone (HEATHER) 0.35 MG tablet, Take 1 tablet by mouth every evening. , Disp: , Rfl:  .  oxyCODONE (OXY IR/ROXICODONE) 5 MG immediate release tablet, Take 5 mg by mouth as needed for severe pain., Disp: , Rfl:  .  tenofovir (VIREAD) 300 MG tablet, Take 300 mg by mouth every evening. , Disp: , Rfl:   EXAM:  Vitals:   07/02/18 1520  BP: 108/60  Pulse: 68  Temp: 98.3 F (36.8 C)  SpO2: 98%    Body mass index is 26.25 kg/m.  GENERAL: vitals reviewed and listed above, alert, oriented, appears well hydrated and in no acute distress  HEENT: atraumatic, conjunttiva clear, no obvious abnormalities on inspection of external nose and ears  NECK: no obvious masses on inspection  LUNGS: clear to auscultation  bilaterally, no wheezes, rales or rhonchi, good air movement  CV: HRRR, no peripheral edema  MS: moves all extremities without noticeable abnormality  PSYCH: pleasant and cooperative, no obvious depression or anxiety  ASSESSMENT AND PLAN:  Discussed the following assessment and plan: More than 50% of over 40 minutes spent in total in caring for this patient was spent face-to-face with the patient, counseling and/or coordinating care.    Generalized abdominal pain  - Plan: Ambulatory referral to Gastroenterology  Acute renal failure, unspecified acute renal failure type (HCC) - Plan: Basic metabolic panel, CBC with Differential/Platelet  Alcohol abuse - Plan: Ambulatory referral to Psychiatry  Other ascites  Viral hepatitis B with hepatitis delta, without hepatic coma, unspecified chronicity  Depression, recurrent (HCC) - Plan: Ambulatory referral to Psychiatry  -taper off mirilax, but advised to use as needed at the first signs of constipation, suspect this may be the cause of the abd discomfort and may have contributed to the urinary retention mentioned on the discharge document. Referral to GI. -no issues with urination currently. -labs per orders -cont follow up with hepatology clinic for hepatitis, ascites -advised complete avoidance of alcohol and advised of outpt and inpatient help for this is difficulty doing so -advised treatment for depression and placed referral to psychiatry, advised she get back in with her counselor or with out behavioral health specialist in the interim and info provided to schedule. Advise of emergency psych options if worsening. Pt agrees to see care if worsening or any severe symptoms. -Patient advised to return or notify a doctor immediately if symptoms worsen or persist or new concerns arise.  Patient Instructions  BEFORE YOU LEAVE: -phq9 in epic -information about alcohol rehab -lab -follow up: 2-3 months  Continue the mirilax once daily for 3-4 more day until cleaned out, then back down to 1/2 cap daily for 1 week. They use as needed to keep bowels soft and regular.   Please call and schedule counseling today for help with depression. See emergency care if worsening or thoughts of harm or severe symptoms.  Continue follow up with your liver specialist about the liver disease, ascites and hepatitis.  Do not drink alcohol. See help for this if you are not about to stop/refrain on your own.  -We placed a  referral for you as discussed to the gastroenterologist about the abdominal pain and to the psychiatrist about the depression and the history of alcohol use. It usually takes about 1-2 weeks to process and schedule this referral. If you have not heard from [place] regarding this appointment in 2 weeks please contact our office.  I hope you are feeling better soon! Seek care promptly if your symptoms worsen, new concerns arise or you are not improving with treatment.       [name], DO

## 2018-07-03 ENCOUNTER — Encounter: Payer: Self-pay | Admitting: Gastroenterology

## 2018-07-03 LAB — BASIC METABOLIC PANEL
BUN: 24 mg/dL — ABNORMAL HIGH (ref 6–23)
CALCIUM: 9.4 mg/dL (ref 8.4–10.5)
CO2: 28 meq/L (ref 19–32)
Chloride: 103 mEq/L (ref 96–112)
Creatinine, Ser: 1.41 mg/dL — ABNORMAL HIGH (ref 0.40–1.20)
GFR: 42.57 mL/min — ABNORMAL LOW (ref >60.00)
Glucose, Bld: 76 mg/dL (ref 70–99)
Potassium: 4.6 mEq/L (ref 3.5–5.1)
SODIUM: 139 meq/L (ref 135–145)

## 2018-07-03 LAB — CBC WITH DIFFERENTIAL/PLATELET
Basophils Absolute: 0.1 10*3/uL (ref 0.0–0.1)
Basophils Relative: 1.2 % (ref 0.0–3.0)
Eosinophils Absolute: 0.1 10*3/uL (ref 0.0–0.7)
Eosinophils Relative: 1.6 % (ref 0.0–5.0)
HCT: 39.8 % (ref 36.0–46.0)
Hemoglobin: 13.5 g/dL (ref 12.0–15.0)
Lymphocytes Relative: 34.2 % (ref 12.0–46.0)
Lymphs Abs: 1.9 10*3/uL (ref 0.7–4.0)
MCHC: 33.9 g/dL (ref 30.0–36.0)
MCV: 90.3 fl (ref 78.0–100.0)
Monocytes Absolute: 0.4 10*3/uL (ref 0.1–1.0)
Monocytes Relative: 7.6 % (ref 3.0–12.0)
Neutro Abs: 3.1 10*3/uL (ref 1.4–7.7)
Neutrophils Relative %: 55.4 % (ref 43.0–77.0)
Platelets: 347 10*3/uL (ref 150.0–400.0)
RBC: 4.41 Mil/uL (ref 3.87–5.11)
RDW: 13.1 % (ref 11.5–15.5)
WBC: 5.6 10*3/uL (ref 4.0–10.5)

## 2018-07-07 NOTE — Addendum Note (Signed)
Addended by: Johnella MoloneyFUNDERBURK, JO A on: 07/07/2018 10:27 AM   Modules accepted: Orders

## 2018-07-08 ENCOUNTER — Other Ambulatory Visit (INDEPENDENT_AMBULATORY_CARE_PROVIDER_SITE_OTHER): Payer: BLUE CROSS/BLUE SHIELD

## 2018-07-08 ENCOUNTER — Ambulatory Visit: Payer: BLUE CROSS/BLUE SHIELD | Admitting: Gastroenterology

## 2018-07-08 ENCOUNTER — Encounter: Payer: Self-pay | Admitting: Gastroenterology

## 2018-07-08 VITALS — BP 100/66 | HR 56 | Ht 60.0 in | Wt 133.8 lb

## 2018-07-08 DIAGNOSIS — R188 Other ascites: Secondary | ICD-10-CM | POA: Diagnosis not present

## 2018-07-08 DIAGNOSIS — R109 Unspecified abdominal pain: Secondary | ICD-10-CM | POA: Diagnosis not present

## 2018-07-08 LAB — CBC WITH DIFFERENTIAL/PLATELET
Basophils Absolute: 0.1 10*3/uL (ref 0.0–0.1)
Basophils Relative: 0.7 % (ref 0.0–3.0)
Eosinophils Absolute: 0.1 10*3/uL (ref 0.0–0.7)
Eosinophils Relative: 1.3 % (ref 0.0–5.0)
HCT: 38.8 % (ref 36.0–46.0)
Hemoglobin: 13.1 g/dL (ref 12.0–15.0)
Lymphocytes Relative: 25.2 % (ref 12.0–46.0)
Lymphs Abs: 1.9 10*3/uL (ref 0.7–4.0)
MCHC: 33.8 g/dL (ref 30.0–36.0)
MCV: 89.3 fl (ref 78.0–100.0)
MONOS PCT: 8.2 % (ref 3.0–12.0)
Monocytes Absolute: 0.6 10*3/uL (ref 0.1–1.0)
Neutro Abs: 4.9 10*3/uL (ref 1.4–7.7)
Neutrophils Relative %: 64.6 % (ref 43.0–77.0)
Platelets: 318 10*3/uL (ref 150.0–400.0)
RBC: 4.34 Mil/uL (ref 3.87–5.11)
RDW: 13.1 % (ref 11.5–15.5)
WBC: 7.6 10*3/uL (ref 4.0–10.5)

## 2018-07-08 LAB — COMPREHENSIVE METABOLIC PANEL
ALT: 19 U/L (ref 0–35)
AST: 16 U/L (ref 0–37)
Albumin: 4.6 g/dL (ref 3.5–5.2)
Alkaline Phosphatase: 54 U/L (ref 39–117)
BILIRUBIN TOTAL: 0.5 mg/dL (ref 0.2–1.2)
BUN: 14 mg/dL (ref 6–23)
CO2: 25 mEq/L (ref 19–32)
Calcium: 10 mg/dL (ref 8.4–10.5)
Chloride: 105 mEq/L (ref 96–112)
Creatinine, Ser: 0.81 mg/dL (ref 0.40–1.20)
GFR: 80.69 mL/min (ref >60.00)
Glucose, Bld: 97 mg/dL (ref 70–99)
Potassium: 4.7 mEq/L (ref 3.5–5.1)
Sodium: 139 mEq/L (ref 135–145)
Total Protein: 7.8 g/dL (ref 6.0–8.3)

## 2018-07-08 LAB — PROTIME-INR
INR: 1.1 ratio — ABNORMAL HIGH (ref 0.8–1.0)
Prothrombin Time: 12.6 s (ref 9.6–13.1)

## 2018-07-08 NOTE — Progress Notes (Signed)
HPI: This is a very pleasant 33 year old woman   who was referred to me by [name], DO  to evaluate abdominal pain.    Chief complaint is subacute abdominal pain  Severe abd pains for about a month.  Terrible abd pains and went to ER. Was told it was a UTI and was put on abx. The pains went away after antibiotics.  The pain returned and she was told it was NOT a UTI this time. She was told she may be constipated.  She was put on miralax and as a result she had some loose stools.  The abdominal pains can be severe.  They are anywhere from lower abdomen to epigastrium.  They do not tend to be related to eating.  She says whenever she has a BM or urinates the pain seems to be worse.  She has a BM daily now.  LAst was 2 days ago.  Never felt constipated however.    Basic metabolic profile a week ago showed her creatinine had risen again to 1.4.  While she was in the hospitalized 2 weeks ago her creatinine was as high as 3.  Her primary care physician was referring her to urology but she has not seen them yet  Has hepatitis B Sees Dawn Drazek at [facility];  Saw her just a week ago.  I do not have any those records here.  She tells me that she was ordered to have a diagnostic paracentesis but then when she went to the hospital to get it done there was not enough fluid to remove.  To be scheduled to see urology  No fevers or chills.  She is an alcoholic, seems to be drinking a bit less lately which is about 1 glass of wine nightly.  She used to drink at least a bottle of wine nightly.    Old Data Reviewed:  CT scan in January 2020 without IV contrast: 1. Stable small to moderate volume of peritoneal ascites. 2. Hepatic steatosis. 3. Trace left pleural effusion.  Ultrasound right upper quadrant January 2020 showed hepatic steatosis.  Mild ascites.  No gallstones or gallbladder abnormalities.  Normal bile duct.  Ultrasound with elastography October 2019 ordered by Fredricka Bonine F0 F1 fibrosis score  Blood work last week: CBC was normal, basic metabolic profile was normal except for creatinine 1.4  Liver tests January 2020 were all normal.  Review of systems: Pertinent positive and negative review of systems were noted in the above HPI section. All other review negative.   Past Medical History:  Diagnosis Date  . Abnormal Pap smear    normal 06/2013  . Depression    suicide attempt in college  . Diabetes mellitus without complication (HCC)    gestational, no issues since pregnancy per her report  . Gestational diabetes    metformin  . Hepatitis    Hep B, managed by Dr. Kinnie Scales  . History of chicken pox   . History of UTI   . Vaginal Pap smear, abnormal     Past Surgical History:  Procedure Laterality Date  . CESAREAN SECTION N/A 01/26/2013   Procedure: Primary Cesarean Section Delivery Baby  Boy @ 0017, Apgars 4/8/9;  Surgeon: Freddrick March. Tenny Craw, MD;  Location: [facility];  Service: Obstetrics;  Laterality: N/A;  . CESAREAN SECTION N/A 11/18/2017   Procedure: CESAREAN SECTION;  Surgeon: Olivia Mackie, MD;  Location: [facility];  Service: Obstetrics;  Laterality: N/A;  . COLPOSCOPY    .  NASAL SINUS SURGERY      Current Outpatient Medications  Medication Sig Dispense Refill  . norethindrone (HEATHER) 0.35 MG tablet Take 1 tablet by mouth every evening.     . polyethylene glycol (MIRALAX / GLYCOLAX) packet Take 17 g by mouth daily.    Marland Kitchen. tenofovir (VIREAD) 300 MG tablet Take 300 mg by mouth every evening.      No current facility-administered medications for this visit.     Allergies as of 07/08/2018 - Review Complete 07/08/2018  Allergen Reaction Noted  . Contrast media [iodinated diagnostic agents] Shortness Of Breath and Rash 06/26/2018    Family History  Adopted: Yes  Family history unknown: Yes    Social History   Socioeconomic History  . Marital status: Married    Spouse name: Not on file  . Number of children: 1  . Years  of education: college  . Highest education level: Not on file  Occupational History  . Occupation: Environmental managerphotographer  Social Needs  . Financial resource strain: Not on file  . Food insecurity:    Worry: Not on file    Inability: Not on file  . Transportation needs:    Medical: Not on file    Non-medical: Not on file  Tobacco Use  . Smoking status: Former Smoker    Packs/day: 1.00    Types: Cigarettes  . Smokeless tobacco: Never Used  . Tobacco comment: smoked for 8 years, quit in 2013  Substance and Sexual Activity  . Alcohol use: Yes    Alcohol/week: 28.0 standard drinks    Types: 28 Glasses of wine per week    Comment: 4 glasses wine/day   . Drug use: No  . Sexual activity: Yes    Birth control/protection: Pill  Lifestyle  . Physical activity:    Days per week: Not on file    Minutes per session: Not on file  . Stress: Not on file  Relationships  . Social connections:    Talks on phone: Not on file    Gets together: Not on file    Attends religious service: Not on file    Active member of club or organization: Not on file    Attends meetings of clubs or organizations: Not on file    Relationship status: Not on file  . Intimate partner violence:    Fear of current or ex partner: Not on file    Emotionally abused: Not on file    Physically abused: Not on file    Forced sexual activity: Not on file  Other Topics Concern  . 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      Home Situation: lives with husband, son born in 01/2013      Spiritual Beliefs: none      Lifestyle: no regular exercise; diet is fair - american diet     Physical Exam: BP 100/66   Pulse (!) 56   Ht 5' (1.524 m)   Wt 133 lb 12.8 oz (60.7 kg)   LMP 06/09/2018 (Approximate)   BMI 26.13 kg/m  Constitutional: generally well-appearing Psychiatric: alert and oriented x3 Eyes: extraocular movements intact Mouth: oral pharynx moist, no lesions Neck: supple  no lymphadenopathy Cardiovascular: heart regular rate and rhythm Lungs: clear to auscultation bilaterally Abdomen: soft, nontender, nondistended, no obvious ascites, no peritoneal signs, normal bowel sounds Extremities: no lower extremity edema bilaterally Skin: no lesions on visible extremities   Assessment and plan:  33 y.o. female with chronic hepatitis B, abdominal pain, ascites, elevated creatinine  It does not look like she has cirrhosis from her chronic hepatitis B.  She is under the care of Alvera Novelawn Graczyk at [facility] and is on tenofovir.  Imaging has not shown cirrhosis even recently by ultrasound and by CT scan.  She has significant abdominal pains for about a month and I am struck that she has ascites on examination and ascites is noted on her imaging as well.  It has never been sampled.  Possibly she has mild SBP?  Not sure why she would since I do not believe that she has cirrhosis.  May be the ascites is from acute alcoholic hepatitis however liver testing in the past month has been essentially normal.  I think I like to start her work-up with blood tests to do a CBC, complete metabolic profile, coags as well as urinalysis.  We will also send her for diagnostic paracentesis checking for cell count, differential, culture, albumin, cytology.  If that testing is not helpful she might need further testing such as an upper endoscopy, less likely colonoscopy.   Please see the "Patient Instructions" section for addition details about the plan.   Rob Buntinganiel Samar Venneman, MD [facility] 07/08/2018, 1:30 PM  Cc: Terressa KoyanagiKim, [name], DO

## 2018-07-08 NOTE — Patient Instructions (Addendum)
You will have labs checked today in the basement lab.  Please head down after you check out with the front desk  (cbc, cmet, inr, UA, urine culture).  Diagnostic paracentesis; send the fluid for cell count, differential, culture, albumin, cytology, total protein.  Pending the results of the above, you may need further testing.  You have been scheduled for an abdominal paracentesis at [facility] radiology (1st floor of hospital) on 07/09/18 at 9am. Please arrive at least 15 minutes prior to your appointment time for registration. Should you need to reschedule this appointment for any reason, please call our office at [phone].  Thank you for entrusting me with your care and choosing [facility].  Dr Christella Hartigan

## 2018-07-09 ENCOUNTER — Ambulatory Visit (HOSPITAL_COMMUNITY)
Admission: RE | Admit: 2018-07-09 | Discharge: 2018-07-09 | Disposition: A | Discharge status: Home / Self Care | Payer: BLUE CROSS/BLUE SHIELD | Source: Ambulatory Visit | Attending: Gastroenterology | Admitting: Gastroenterology

## 2018-07-09 ENCOUNTER — Other Ambulatory Visit: Payer: Self-pay | Admitting: Gastroenterology

## 2018-07-09 ENCOUNTER — Telehealth: Payer: Self-pay | Admitting: Gastroenterology

## 2018-07-09 DIAGNOSIS — R188 Other ascites: Secondary | ICD-10-CM

## 2018-07-09 DIAGNOSIS — R109 Unspecified abdominal pain: Secondary | ICD-10-CM

## 2018-07-09 NOTE — Progress Notes (Signed)
  I was present during limited US of the abdomen for ascites for possible paracentesis.  There is no ascites present.   No need for paracentesis today.  Gianny Sabino S Araf Clugston PA-C 07/09/2018 9:20 AM

## 2018-07-09 NOTE — Telephone Encounter (Signed)
Pt stated Dr.Jacobs sent her to have a paracentesis which they advised her there was not enough of fluid to remove. So she is wanting to know what is her next step.

## 2018-07-09 NOTE — Telephone Encounter (Signed)
See results of paracentesis.  Unable to perform.  Please advise next step

## 2018-07-09 NOTE — Procedures (Signed)
07/09/18  Kim HesselbachMaria Pacheco here in interventional radiology for paracentesis.  Corrin ParkerWendy Blair PA-C here at bedside to check for adequate fluid by ultrasound.  There is not enough peritoneal fluid to perform a paracentesis.    jkc

## 2018-07-10 ENCOUNTER — Telehealth: Payer: Self-pay | Admitting: Family Medicine

## 2018-07-10 ENCOUNTER — Ambulatory Visit: Payer: Self-pay | Admitting: *Deleted

## 2018-07-10 NOTE — Telephone Encounter (Signed)
See results not from today

## 2018-07-10 NOTE — Telephone Encounter (Signed)
Dr Hassan Rowan reviewed the message below and stated unfortunately a fluid pill will not help in this case.  She asked if the pt was having any urinary difficulty or other releated problems.  I called the pt and informed her of this and she denies any urinary symptoms.

## 2018-07-10 NOTE — Telephone Encounter (Signed)
Copied from CRM [id number]. Topic: General - Other >> Jul 10, 2018  4:24 PM Tamela Oddi wrote: Reason for CRM: Patient called to ask if the doctor could refer her to another urologist because the current referral doctor will not be available until March.  Patient wants to see someone before that date.  CB# [phone]

## 2018-07-10 NOTE — Telephone Encounter (Signed)
Please advise 

## 2018-07-10 NOTE — Telephone Encounter (Signed)
Patient called to say that she saw a GI specialist on 07/09/2018 and is waiting for the result and also waiting to hear from a Urologist but is asking if Dr Selena Batten could prescribe her some form of diuretic to help relieve the fluid in her. Complains of pain in abdomen after using the bathroom. Ph# [phone]  Patient had appointment for paracentesis yesterday- but she did not have enough fluid for them to draw off. Patient is still having pain with BM and with holding urine. She is following up with GI about testing and she is waiting for appointment with urology.   Patient was in to see Dr Selena Batten last week- she is requesting a fluid pill for the fluid in her abdomen. 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 She wants this to be over. Told patient I would send he request- but a diuretic may not be the appropriate treatment at this time. Will have provider review her request.   Reason for Disposition . Caller requesting a NON-URGENT new prescription or refill and triager unable to refill per unit policy    Patient is requesting diuretic for abdominal swelling.  Answer Assessment - Initial Assessment Questions 1. LOCATION: "Where does it hurt?"      upper and lower abdomin and back 2. RADIATION: "Does the pain shoot anywhere else?" (e.g., chest, back)     no 3. ONSET: "When did the pain begin?" (e.g., minutes, hours or days ago)      December- patient was diagnosed with severe UTI and now it is back 4. SUDDEN: "Gradual or sudden onset?"     Sudden onset 5. PATTERN "Does the pain come and go, or is it constant?"    - If constant: "Is it getting better, staying the same, or worsening?"      (Note: Constant means the pain never goes away completely; most serious pain is constant and it progresses)     - If intermittent: "How long does it last?" "Do you have pain  now?"     (Note: Intermittent means the pain goes away completely between bouts)     Constant 6. SEVERITY: "How bad is the pain?"  (e.g., Scale 1-10; mild, moderate, or severe)    - MILD (1-3): doesn't interfere with normal activities, abdomen soft and not tender to touch     - MODERATE (4-7): interferes with normal activities or awakens from sleep, tender to touch     - SEVERE (8-10): excruciating pain, doubled over, unable to do any normal activities       Goes from 6-7 to 8 with BM 7. RECURRENT SYMPTOM: "Have you ever had this type of abdominal pain before?" If so, ask: "When was the last time?" and "What happened that time?"      UTI- last checked for UTI 1/14 8. AGGRAVATING FACTORS: "Does anything seem to cause this pain?" (e.g., foods, stress, alcohol)     no 9. CARDIAC SYMPTOMS: "Do you have any of the following symptoms: chest pain, difficulty breathing, sweating, nausea?"     no 10. OTHER SYMPTOMS: "Do you have any other symptoms?" (e.g., fever, vomiting, diarrhea)       no 11. PREGNANCY: "Is there any chance you are pregnant?" "When was your last menstrual period?"       No- OCP use  Answer Assessment - Initial Assessment Questions 1. SYMPTOMS: "  Do you have any symptoms?"     abdominal pain 2. SEVERITY: If symptoms are present, ask "Are they mild, moderate or severe?"     Moderate/severe  Protocols used: MEDICATION QUESTION CALL-A-AH, ABDOMINAL PAIN - UPPER-A-AH

## 2018-07-13 ENCOUNTER — Encounter: Payer: Self-pay | Admitting: Family Medicine

## 2018-07-13 ENCOUNTER — Other Ambulatory Visit (INDEPENDENT_AMBULATORY_CARE_PROVIDER_SITE_OTHER): Payer: BLUE CROSS/BLUE SHIELD

## 2018-07-13 ENCOUNTER — Other Ambulatory Visit: Payer: BLUE CROSS/BLUE SHIELD

## 2018-07-13 DIAGNOSIS — N179 Acute kidney failure, unspecified: Secondary | ICD-10-CM | POA: Diagnosis not present

## 2018-07-13 DIAGNOSIS — R188 Other ascites: Secondary | ICD-10-CM | POA: Diagnosis not present

## 2018-07-13 DIAGNOSIS — N3000 Acute cystitis without hematuria: Secondary | ICD-10-CM | POA: Diagnosis not present

## 2018-07-13 LAB — BASIC METABOLIC PANEL
BUN: 13 mg/dL (ref 6–23)
CALCIUM: 9.8 mg/dL (ref 8.4–10.5)
CO2: 28 mEq/L (ref 19–32)
Chloride: 102 mEq/L (ref 96–112)
Creatinine, Ser: 0.74 mg/dL (ref 0.40–1.20)
GFR: 89.56 mL/min (ref >60.00)
Glucose, Bld: 93 mg/dL (ref 70–99)
Potassium: 4.3 mEq/L (ref 3.5–5.1)
Sodium: 139 mEq/L (ref 135–145)

## 2018-07-13 NOTE — Telephone Encounter (Signed)
Per Gavin Pound, she called the urology office and was informed the pt was seen in the office this AM.

## 2018-07-13 NOTE — Telephone Encounter (Signed)
I called the pt and informed her of the message below. Patient agreed to call her GYN as she is still having some bleeding.

## 2018-07-13 NOTE — Telephone Encounter (Signed)
There is only one urology clinic to my knowledge. Can we ensure the referral was urgent or change if note? Also she should call them to ask to be on weight list and if any urinary symptoms.

## 2018-07-13 NOTE — Telephone Encounter (Signed)
Ok, great. Can we get notes?

## 2018-07-15 DIAGNOSIS — R102 Pelvic and perineal pain: Secondary | ICD-10-CM | POA: Diagnosis not present

## 2018-07-15 DIAGNOSIS — N939 Abnormal uterine and vaginal bleeding, unspecified: Secondary | ICD-10-CM | POA: Diagnosis not present

## 2018-07-15 NOTE — Telephone Encounter (Signed)
Notes received and placed on Dr Elmyra Ricks desk.

## 2018-07-22 ENCOUNTER — Encounter: Payer: Self-pay | Admitting: Gastroenterology

## 2018-07-22 ENCOUNTER — Ambulatory Visit (AMBULATORY_SURGERY_CENTER): Payer: Self-pay

## 2018-07-22 ENCOUNTER — Telehealth: Payer: Self-pay

## 2018-07-22 VITALS — Ht 60.0 in | Wt 127.4 lb

## 2018-07-22 DIAGNOSIS — R1030 Lower abdominal pain, unspecified: Secondary | ICD-10-CM

## 2018-07-22 NOTE — Telephone Encounter (Signed)
Called pt on her cell number and left message it was ok per Dr Christella Hartigan  to schedule an endo/colon. She would need to reschedule her appt from 07/29/2018 since there was not an opening for a double procedure. Informed pt to call back and speak to the scheduler, if I was not available. Cherylann Ratel

## 2018-07-22 NOTE — Telephone Encounter (Signed)
Dr Christella Hartigan. This pt is scheduled for an endoscopy on 08/08/2018. During the PV, the pt requested to have a colonoscopy done with the endoscopy due to pain in her lower back when she strains to have a BM and she has a history of constipation. No family hx colon cancer, no hx colon polyps or changes in her bowel habits. It it ok to schedule an endo/colon?. Please advise. Thanks, Cherylann Ratel in [facility]

## 2018-07-22 NOTE — Progress Notes (Signed)
Per pt, no allergies to soy or egg products.Pt not taking any weight loss meds or using  O2 at home.  Pt refused emmi video. 

## 2018-07-22 NOTE — Telephone Encounter (Signed)
Seems reasonable however on the date of her EGD which is February 19 there are no open spots that would allow her to also do a colonoscopy for her that day.  If she wants to do them both on the same day then we will have to look for a different day for her that has adequate time for a "double" procedure.

## 2018-07-23 DIAGNOSIS — R188 Other ascites: Secondary | ICD-10-CM | POA: Diagnosis not present

## 2018-07-23 DIAGNOSIS — F419 Anxiety disorder, unspecified: Secondary | ICD-10-CM | POA: Diagnosis not present

## 2018-07-23 NOTE — Telephone Encounter (Signed)
Pt returned call and wants to keep her appt for endo on 07-29-2018 and she will schedule a colon at a later date

## 2018-07-29 ENCOUNTER — Encounter: Payer: Self-pay | Admitting: Gastroenterology

## 2018-07-29 ENCOUNTER — Ambulatory Visit (AMBULATORY_SURGERY_CENTER): Payer: BLUE CROSS/BLUE SHIELD | Admitting: Gastroenterology

## 2018-07-29 VITALS — BP 104/60 | HR 50 | Temp 98.0°F | Resp 19 | Ht 60.0 in | Wt 127.0 lb

## 2018-07-29 DIAGNOSIS — K2950 Unspecified chronic gastritis without bleeding: Secondary | ICD-10-CM | POA: Diagnosis not present

## 2018-07-29 DIAGNOSIS — K2990 Gastroduodenitis, unspecified, without bleeding: Secondary | ICD-10-CM

## 2018-07-29 DIAGNOSIS — R1030 Lower abdominal pain, unspecified: Secondary | ICD-10-CM | POA: Diagnosis not present

## 2018-07-29 DIAGNOSIS — K2970 Gastritis, unspecified, without bleeding: Secondary | ICD-10-CM

## 2018-07-29 MED ORDER — SODIUM CHLORIDE 0.9 % IV SOLN: 500.0000 mL | Freq: Once | INTRAVENOUS | Status: DC

## 2018-07-29 NOTE — Op Note (Signed)
[facility] Center Patient Name: Kim Pacheco Procedure Date: 07/29/2018 2:32 PM MRN: [id number] Endoscopist: Rachael Fee , MD Age: 33 Referring MD:  Date of Birth: [birth year] Gender: Female Account #: [id number] Procedure:                Upper GI endoscopy Indications:              Epigastric abdominal pain, Lower abdominal pain Medicines:                Monitored Anesthesia Care Procedure:                Pre-Anesthesia Assessment:                           - Prior to the procedure, a History and Physical                            was performed, and patient medications and                            allergies were reviewed. The patient's tolerance of                            previous anesthesia was also reviewed. The risks                            and benefits of the procedure and the sedation                            options and risks were discussed with the patient.                            All questions were answered, and informed consent                            was obtained. Prior Anticoagulants: The patient has                            taken no previous anticoagulant or antiplatelet                            agents. ASA Grade Assessment: II - A patient with                            mild systemic disease. After reviewing the risks                            and benefits, the patient was deemed in                            satisfactory condition to undergo the procedure.                           After obtaining informed consent, the endoscope was  passed under direct vision. Throughout the                            procedure, the patient's blood pressure, pulse, and                            oxygen saturations were monitored continuously. The                            Endoscope was introduced through the mouth, and                            advanced to the second part of duodenum. The upper   GI endoscopy was accomplished without difficulty.                            The patient tolerated the procedure well. Scope In: Scope Out: Findings:                 Mild inflammation characterized by erythema and                            granularity was found in the gastric antrum.                            Biopsies were taken with a cold forceps for                            histology.                           The exam was otherwise without abnormality. Complications:            No immediate complications. Estimated blood loss:                            None. Estimated Blood Loss:     Estimated blood loss: none. Impression:               - Gastritis. Biopsied.                           - The examination was otherwise normal. Recommendation:           - Patient has a contact number available for                            emergencies. The signs and symptoms of potential                            delayed complications were discussed with the                            patient. Return to normal activities tomorrow.                            Written discharge instructions were provided to the  patient.                           - Resume previous diet.                           - Continue present medications.                           - Await pathology results. If the biopsies are not                            helpful, you will probably need further testing                            (colonoscopy?) Rachael Fee, MD 07/29/2018 2:42:58 PM This report has been signed electronically.

## 2018-07-29 NOTE — Progress Notes (Signed)
Report to PACU, RN, vss, BBS= Clear.  

## 2018-07-29 NOTE — Progress Notes (Signed)
Called to room to assist during endoscopic procedure.  Patient ID and intended procedure confirmed with present staff. Received instructions for my participation in the procedure from the performing physician.  

## 2018-07-29 NOTE — Patient Instructions (Signed)
YOU HAD AN ENDOSCOPIC PROCEDURE TODAY AT THE [facility]:   Refer to the procedure report that was given to you for any specific questions about what was found during the examination.  If the procedure report does not answer your questions, please call your gastroenterologist to clarify.  If you requested that your care partner not be given the details of your procedure findings, then the procedure report has been included in a sealed envelope for you to review at your convenience later.  YOU SHOULD EXPECT: Some feelings of bloating in the abdomen. Passage of more gas than usual.  Walking can help get rid of the air that was put into your GI tract during the procedure and reduce the bloating.  Please Note:  You might notice some irritation and congestion in your nose or some drainage.  This is from the oxygen used during your procedure.  There is no need for concern and it should clear up in a day or so.  SYMPTOMS TO REPORT IMMEDIATELY:    Following upper endoscopy (EGD)  Vomiting of blood or coffee ground material  New chest pain or pain under the shoulder blades  Painful or persistently difficult swallowing  New shortness of breath  Fever of 100F or higher  Black, tarry-looking stools  For urgent or emergent issues, a gastroenterologist can be reached at any hour by calling [phone].   DIET:  We do recommend a small meal at first, but then you may proceed to your regular diet.  Drink plenty of fluids but you should avoid alcoholic beverages for 24 hours.  ACTIVITY:  You should plan to take it easy for the rest of today and you should NOT DRIVE or use heavy machinery until tomorrow (because of the sedation medicines used during the test).    FOLLOW UP: Our staff will call the number listed on your records the next business day following your procedure to check on you and address any questions or concerns that you may have regarding the information given to you following  your procedure. If we do not reach you, we will leave a message.  However, if you are feeling well and you are not experiencing any problems, there is no need to return our call.  We will assume that you have returned to your regular daily activities without incident.  If any biopsies were taken you will be contacted by phone or by letter within the next 1-3 weeks.  Please call us at [phone] if you have not heard about the biopsies in 3 weeks.    SIGNATURES/CONFIDENTIALITY: You and/or your care partner have signed paperwork which will be entered into your electronic medical record.  These signatures attest to the fact that that the information above on your After Visit Summary has been reviewed and is understood.  Full responsibility of the confidentiality of this discharge information lies with you and/or your care-partner.  Read all handouts given to you by your recovery nurse.

## 2018-07-30 ENCOUNTER — Telehealth: Payer: Self-pay

## 2018-07-30 DIAGNOSIS — R971 Elevated cancer antigen 125 [CA 125]: Secondary | ICD-10-CM | POA: Diagnosis not present

## 2018-07-30 DIAGNOSIS — R188 Other ascites: Secondary | ICD-10-CM | POA: Diagnosis not present

## 2018-07-30 DIAGNOSIS — R109 Unspecified abdominal pain: Secondary | ICD-10-CM | POA: Diagnosis not present

## 2018-07-30 NOTE — Telephone Encounter (Signed)
Second follow up call attempt, no answer message left. 

## 2018-07-30 NOTE — Telephone Encounter (Signed)
Thank you, I agree. 

## 2018-07-30 NOTE — Telephone Encounter (Signed)
  Follow up Call-  Call back number 07/29/2018  Post procedure Call Back phone  # [phone]  Permission to leave phone message Yes  Some recent data might be hidden     Patient questions:  Do you have a fever, pain , or abdominal swelling? No. Pain Score  0 *  Have you tolerated food without any problems? Yes.    Have you been able to return to your normal activities? Yes.    Do you have any questions about your discharge instructions: Diet   No. Medications  No. Follow up visit  No.  Do you have questions or concerns about your Care? Yes.  Patient states that she is having lower abdominal discomfort described as a "soreness". Patient denies nausea, vomiting, or bleeding. Patient she has gone back to her regular diet without difficulty. Procedure report states she had the upper endoscopy due to epigastric pain and lower abdominal pain. Advised patient to continue to pass air and, if this does not resolve, gets worse, or she develops any of the above symptoms, please call us immediately.  Actions: * If pain score is 4 or above: Physician/ provider Notified : Rob Bunting, MD.

## 2018-08-04 ENCOUNTER — Telehealth: Payer: Self-pay | Admitting: Gastroenterology

## 2018-08-04 NOTE — Telephone Encounter (Signed)
Pt called about lab results. °

## 2018-08-04 NOTE — Telephone Encounter (Signed)
Dr Jacobs have you seen the path results? 

## 2018-08-05 DIAGNOSIS — B181 Chronic viral hepatitis B without delta-agent: Secondary | ICD-10-CM | POA: Diagnosis not present

## 2018-08-05 DIAGNOSIS — R188 Other ascites: Secondary | ICD-10-CM | POA: Diagnosis not present

## 2018-08-05 NOTE — Telephone Encounter (Signed)
See results note. 

## 2018-08-06 ENCOUNTER — Other Ambulatory Visit: Payer: Self-pay | Admitting: Nurse Practitioner

## 2018-08-06 DIAGNOSIS — K7460 Unspecified cirrhosis of liver: Secondary | ICD-10-CM

## 2018-08-06 DIAGNOSIS — K769 Liver disease, unspecified: Secondary | ICD-10-CM

## 2018-08-06 DIAGNOSIS — R971 Elevated cancer antigen 125 [CA 125]: Secondary | ICD-10-CM | POA: Insufficient documentation

## 2018-08-18 DIAGNOSIS — Z01812 Encounter for preprocedural laboratory examination: Secondary | ICD-10-CM | POA: Diagnosis not present

## 2018-08-18 DIAGNOSIS — R971 Elevated cancer antigen 125 [CA 125]: Secondary | ICD-10-CM | POA: Diagnosis not present

## 2018-08-25 DIAGNOSIS — R971 Elevated cancer antigen 125 [CA 125]: Secondary | ICD-10-CM | POA: Diagnosis not present

## 2018-08-25 DIAGNOSIS — N736 Female pelvic peritoneal adhesions (postinfective): Secondary | ICD-10-CM | POA: Diagnosis not present

## 2018-08-25 DIAGNOSIS — B1910 Unspecified viral hepatitis B without hepatic coma: Secondary | ICD-10-CM | POA: Diagnosis not present

## 2018-08-25 DIAGNOSIS — N994 Postprocedural pelvic peritoneal adhesions: Secondary | ICD-10-CM | POA: Diagnosis not present

## 2018-08-25 DIAGNOSIS — R109 Unspecified abdominal pain: Secondary | ICD-10-CM | POA: Diagnosis not present

## 2018-08-25 DIAGNOSIS — Z91041 Radiographic dye allergy status: Secondary | ICD-10-CM | POA: Diagnosis not present

## 2018-08-25 DIAGNOSIS — R188 Other ascites: Secondary | ICD-10-CM | POA: Diagnosis not present

## 2018-08-25 HISTORY — PX: DIAGNOSTIC LAPAROSCOPY: SUR761

## 2018-09-01 ENCOUNTER — Telehealth (INDEPENDENT_AMBULATORY_CARE_PROVIDER_SITE_OTHER): Payer: BLUE CROSS/BLUE SHIELD | Admitting: Family Medicine

## 2018-09-01 ENCOUNTER — Other Ambulatory Visit: Payer: Self-pay

## 2018-09-01 DIAGNOSIS — K5900 Constipation, unspecified: Secondary | ICD-10-CM | POA: Diagnosis not present

## 2018-09-01 DIAGNOSIS — R188 Other ascites: Secondary | ICD-10-CM | POA: Diagnosis not present

## 2018-09-01 DIAGNOSIS — Z789 Other specified health status: Secondary | ICD-10-CM

## 2018-09-01 DIAGNOSIS — F324 Major depressive disorder, single episode, in partial remission: Secondary | ICD-10-CM

## 2018-09-01 DIAGNOSIS — Z7289 Other problems related to lifestyle: Secondary | ICD-10-CM | POA: Diagnosis not present

## 2018-09-01 DIAGNOSIS — B1910 Unspecified viral hepatitis B without hepatic coma: Secondary | ICD-10-CM

## 2018-09-01 NOTE — Progress Notes (Signed)
Virtual Visit via Video Note  I connected with Kim Pacheco on 09/01/18 at  4:15 PM EDT by a video enabled telemedicine application and verified that I am speaking with the correct person using two identifiers.  Location patient: home Location provider:work or home office  Persons participating in the virtual visit: patient, provider  I discussed the limitations of evaluation and management by telemedicine and the availability of in person appointments. The patient expressed understanding and agreed to proceed.   HPI:  She is a pleasant 33 yo, unfortunately with a complicated PMH. Currently seeing GI, Hepatology and oncology for  abd pain, ascites, hepatitis B, constipation. She is waiting on results of peritoneal biopsies. She has cut back on alcohol to 1 glass of wine rarely per her report. She is seeing urology as well as had urinary retention and AKI during hospitalization in January. Reports she has been somewhat stressed as family weddings have been canceled and with everything going on with the biopsy and with Coronavirus. Her counselor is no longer seeing patients because of COVID 19. She continues zoloft. Denies severe symptoms, SI or thoughts of harm. Kidney function was normal on labs las month.  ROS: See pertinent positives and negatives per HPI.  Past Medical History:  Diagnosis Date  . Abdominal pain    started in mid abd and spread to left side/on and off/since Jan 2020/ pt states, she has fluid in abd/unknown  . Abnormal Pap smear    normal 06/2013  . Constipation    hx of  . Depression    suicide attempt in college  . Diabetes mellitus without complication (HCC)    gestational, no issues since pregnancy per her report  . Gestational diabetes    /no meds now  . Hepatitis    Hep B, managed by Dr. Kinnie Scales  . History of chicken pox   . History of UTI   . Vaginal Pap smear, abnormal     Past Surgical History:  Procedure Laterality Date  . CESAREAN SECTION N/A  01/26/2013   Procedure: Primary Cesarean Section Delivery Baby  Boy @ 0017, Apgars 4/8/9;  Surgeon: Freddrick March. Tenny Craw, MD;  Location: [facility];  Service: Obstetrics;  Laterality: N/A;  . CESAREAN SECTION N/A 11/18/2017   Procedure: CESAREAN SECTION;  Surgeon: Olivia Mackie, MD;  Location: [facility]IRTHING SU[facility];  Service: Obstetrics;  Laterality: N/A;  . COLPOSCOPY    . NASAL SINUS SURGERY  1996    Family History  Adopted: Yes  Family history unknown: Yes    SOCIAL HX: see hpi   Current Outpatient Medications:  .  acetaminophen (TYLENOL) 500 MG tablet, Take 500 mg by mouth as needed., Disp: , Rfl:  .  cephALEXin (KEFLEX) 250 MG capsule, Take 250 mg by mouth as needed., Disp: , Rfl:  .  Norethin Ace-Eth Estrad-FE (TAYTULLA PO), Take by mouth daily., Disp: , Rfl:  .  norethindrone (HEATHER) 0.35 MG tablet, Take 1 tablet by mouth every evening. , Disp: , Rfl:  .  polyethylene glycol (MIRALAX / GLYCOLAX) packet, Take 17 g by mouth daily., Disp: , Rfl:  .  sertraline (ZOLOFT) 50 MG tablet, Take 50 mg by mouth daily., Disp: , Rfl:  .  tenofovir (VIREAD) 300 MG tablet, Take 300 mg by mouth every evening. , Disp: , Rfl:   EXAM:  VITALS per patient if applicable:  GENERAL: alert, oriented, appears well and in no acute distress  HEENT: atraumatic, conjunttiva clear, no obvious abnormalities on inspection of external  nose and ears  NECK: normal movements of the head and neck  LUNGS: on inspection no signs of respiratory distress, breathing rate appears normal, no obvious gross SOB, gasping or wheezing  CV: no obvious cyanosis  MS: moves all visible extremities without noticeable abnormality  SKIN: surgical sites abd with steristrips, no erythema or drainage  PSYCH/NEURO: pleasant and cooperative, no obvious depression or anxiety, speech and thought processing grossly intact  ASSESSMENT AND PLAN:  Discussed the following assessment and plan:  Depression, major, single episode, in partial  remission (HCC)  Alcohol use  Other ascites  Constipation, unspecified constipation type  Viral hepatitis B with hepatitis delta, without hepatic coma, unspecified chronicity  Advised getting plugged in with our behavioral health specialist as they are able to offer virtual visits during this difficult time. She was in agreement and advised my assistant via staff message to contact pt with details and to assist patient in scheduling a TOC/follow up visit with Dr. Hassan Rowan in 1-2 months as I will be leaving clinical practice. Advised pt to call our office if she was not contact by my assistant regarding this in the next few days. Colonoscopy has been canceled for now.   Results from the biopsy pending Continue care with specialist Continue to work on cutting down on alcohol.  I discussed the assessment and treatment plan with the patient. The patient was provided an opportunity to ask questions and all were answered. The patient agreed with the plan and demonstrated an understanding of the instructions.   The patient was advised to call back or seek an in-person evaluation if the symptoms worsen or if the condition fails to improve as anticipated.  I provided 20 minutes of non-face-to-face time during this encounter.   [name], DO

## 2018-09-02 ENCOUNTER — Telehealth: Payer: Self-pay | Admitting: *Deleted

## 2018-09-02 NOTE — Telephone Encounter (Signed)
Webex appointment has been made and email has been sent to the patient.

## 2018-09-02 NOTE — Telephone Encounter (Signed)
I called the pt and scheduled a Webex visit for a transfer of care with Dr Hassan Rowan on 5/20 at 10am.  Patient was also given the number to contact [facility] for appt info.  Message sent to [place] as I do not have access to Dr Dorita Fray calendar.

## 2018-09-02 NOTE — Telephone Encounter (Signed)
-----   Message from [name], DO sent at 09/01/2018  4:45 PM EDT ----- Can you please help Andrian schedule a virtual TOC visit with Dr. Hassan Rowan in about 2 months and also get her the number to contact [facility] health for a new patient virtual visit for counseling? Thanks!

## 2018-09-04 ENCOUNTER — Encounter: Payer: BLUE CROSS/BLUE SHIELD | Admitting: Gastroenterology

## 2018-10-13 DIAGNOSIS — N3000 Acute cystitis without hematuria: Secondary | ICD-10-CM | POA: Diagnosis not present

## 2018-10-19 DIAGNOSIS — E119 Type 2 diabetes mellitus without complications: Secondary | ICD-10-CM | POA: Diagnosis not present

## 2018-10-19 LAB — HM DIABETES EYE EXAM

## 2018-10-28 ENCOUNTER — Other Ambulatory Visit: Payer: Self-pay

## 2018-10-28 ENCOUNTER — Encounter: Payer: Self-pay | Admitting: Family Medicine

## 2018-10-28 ENCOUNTER — Ambulatory Visit (INDEPENDENT_AMBULATORY_CARE_PROVIDER_SITE_OTHER): Payer: BLUE CROSS/BLUE SHIELD | Admitting: Family Medicine

## 2018-10-28 DIAGNOSIS — F321 Major depressive disorder, single episode, moderate: Secondary | ICD-10-CM

## 2018-10-28 DIAGNOSIS — B1910 Unspecified viral hepatitis B without hepatic coma: Secondary | ICD-10-CM | POA: Diagnosis not present

## 2018-10-28 DIAGNOSIS — F1010 Alcohol abuse, uncomplicated: Secondary | ICD-10-CM

## 2018-10-28 DIAGNOSIS — R188 Other ascites: Secondary | ICD-10-CM | POA: Diagnosis not present

## 2018-10-28 MED ORDER — SERTRALINE HCL 100 MG PO TABS: 100.0000 mg | ORAL_TABLET | Freq: Every day | ORAL | 5 refills | Status: DC

## 2018-10-28 NOTE — Progress Notes (Signed)
Virtual Visit via Video Note  I connected with Kim Pacheco  on 10/28/18 at 10:00 AM EDT by a video enabled telemedicine application and verified that I am speaking with the correct person using two identifiers.  Location patient: home Location provider:work office Persons participating in the virtual visit: patient, provider  I discussed the limitations of evaluation and management by telemedicine and the availability of in person appointments. The patient expressed understanding and agreed to proceed.   Kim Pacheco DOB: 1985-09-26 Encounter date: 10/28/2018  This is a 33 y.o. female who presents to establish care. Chief Complaint  Patient presents with  . Establish Care    transfer from Dr Kim BattenKim    History of present illness: Following with GI, hepatology, oncology for abd pain, ascites, hep B, constipation. Per last note with [facility] had cut back on alcohol to 1 glass of wine rarely. Also seeing urology due to urinary retention/AKI during Jan hospitalization.   Does still drink alcohol with dinner 2-3 glasses to compliment meal. Did drink in high school/college to relieve stress/relax. Husband has alcoholism in his family. Adoptive parents drank a lot when she was growing up. Doesn't view alcohol as problem for her now.   Initially came here in Jan with abdominal pain; ended up getting series of evaluation. Has seen multiple specialists in order to try and figure out source of problem.   Oncology did laproscopy and they did sample fluid.   Gynecologist said that everything was fine.   Gastrologist scheduled her for endoscopy which was ok. They were scheduling for colonoscopy until COVID occurred, but then this was delayed.   Hepatitis B carrier; managed by Dr. Kinnie ScalesMedoff.   Depression: on zoloft. Increased stress/anxiety with everything going on (she is Advertising account executivewedding photographer) with covid and uncertainty of everything. She is home with kids all the time now. Husband  works for [employer], but still going in 3 days/week. They alternate so that she works on his time at home and he cares for children.   Past Medical History:  Diagnosis Date  . Abdominal pain    started in mid abd and spread to left side/on and off/since Jan 2020/ pt states, she has fluid in abd/unknown  . Abnormal Pap smear    normal 06/2013  . Constipation    hx of  . Depression    suicide attempt in college  . Diabetes mellitus without complication (HCC)    gestational, no issues since pregnancy per her report  . Gestational diabetes    /no meds now  . Hepatitis    Hep B, managed by Dr. Kinnie ScalesMedoff  . History of chicken pox   . History of UTI   . Vaginal Pap smear, abnormal    Past Surgical History:  Procedure Laterality Date  . CESAREAN SECTION N/A 01/26/2013   Procedure: Primary Cesarean Section Delivery Baby  Boy @ 0017, Apgars 4/8/9;  Surgeon: Freddrick MarchKendra H. Tenny Crawoss, MD;  Location: [facility];  Service: Obstetrics;  Laterality: N/A;  . CESAREAN SECTION N/A 11/18/2017   Procedure: CESAREAN SECTION;  Surgeon: Kim Pacheco, Richard, MD;  [facility];  Service: Obstetrics;  Laterality: N/A;  . ESOPHAGOGASTRODUODENOSCOPY ENDOSCOPY    . laproscopy  2020  . NASAL SINUS SURGERY  1996   Allergies  Allergen Reactions  . Contrast Media [Iodinated Diagnostic Agents] Shortness Of Breath and Rash    IV contrast    Current Meds  Medication Sig  . acetaminophen (TYLENOL) 500 MG tablet Take 500 mg by  mouth as needed.  . cephALEXin (KEFLEX) 250 MG capsule Take 250 mg by mouth as needed.  . Norethin Ace-Eth Estrad-FE (TAYTULLA PO) Take by mouth daily.  Marland Kitchen tenofovir (VIREAD) 300 MG tablet Take 300 mg by mouth every evening.   . [DISCONTINUED] sertraline (ZOLOFT) 50 MG tablet Take 50 mg by mouth daily.   Social History   Tobacco Use  . Smoking status: Former Smoker    Packs/day: 1.00    Types: Cigarettes    Last attempt to quit: 07/23/2011    Years since quitting: 7.2  . Smokeless  tobacco: Never Used  . Tobacco comment: smoked for 8 years, quit in 2013  Substance Use Topics  . Alcohol use: Yes    Alcohol/week: 2.0 standard drinks    Types: 2 Glasses of wine per week   Family History  Adopted: Yes  Family history unknown: Yes     Review of Systems  Constitutional: Negative for chills, fatigue and fever.  Respiratory: Negative for cough, chest tightness, shortness of breath and wheezing.   Cardiovascular: Negative for chest pain, palpitations and leg swelling.    Objective:  There were no vitals taken for this visit.      BP Readings from Last 3 Encounters:  07/29/18 104/60  07/08/18 100/66  07/02/18 108/60   Wt Readings from Last 3 Encounters:  07/29/18 127 lb (57.6 kg)  07/22/18 127 lb 6.4 oz (57.8 kg)  07/08/18 133 lb 12.8 oz (60.7 kg)    EXAM:  GENERAL: alert, oriented, appears well and in no acute distress  HEENT: atraumatic, conjunctiva clear, no obvious abnormalities on inspection of external nose and ears  NECK: normal movements of the head and neck  LUNGS: on inspection no signs of respiratory distress, breathing rate appears normal, no obvious gross SOB, gasping or wheezing  CV: no obvious cyanosis  MS: moves all visible extremities without noticeable abnormality  PSYCH/NEURO: pleasant and cooperative, no obvious depression or anxiety, speech and thought processing grossly intact  SKIN: no facial/neck abnormalities noted.  Assessment/Plan  1. Depression, major, single episode, moderate (HCC) Increase zoloft to 100mg . We discussed that alcohol is a depressant as well, and that cutting back (ideally stopping) will help achieve better mood control. She does not sound ready to quit drinking currently, so we will continue to review/discuss.   2. Viral hepatitis B with hepatitis delta, without hepatic coma, unspecified chronicity Following w specialist. Discussed alcohol detriment to liver as well.  3. Alcohol abuse See above.  Will continue to discuss. Currently not viewing alcohol use as problem.  4. Other ascites Under evaluation (multispecialty) presently. Awaiting colonoscopy (due to COVID delay).  Return in about 1 month (around 11/28/2018) for Chronic condition visit.      I discussed the assessment and treatment plan with the patient. The patient was provided an opportunity to ask questions and all were answered. The patient agreed with the plan and demonstrated an understanding of the instructions.   The patient was advised to call back or seek an in-person evaluation if the symptoms worsen or if the condition fails to improve as anticipated.  I provided 25 minutes of non-face-to-face time during this encounter.   Theodis Shove, MD

## 2018-10-30 DIAGNOSIS — R188 Other ascites: Secondary | ICD-10-CM | POA: Diagnosis not present

## 2018-10-30 DIAGNOSIS — B181 Chronic viral hepatitis B without delta-agent: Secondary | ICD-10-CM | POA: Diagnosis not present

## 2018-11-19 DIAGNOSIS — Z20828 Contact with and (suspected) exposure to other viral communicable diseases: Secondary | ICD-10-CM | POA: Diagnosis not present

## 2018-11-27 ENCOUNTER — Ambulatory Visit: Payer: Self-pay | Admitting: Family Medicine

## 2018-11-30 ENCOUNTER — Encounter: Payer: Self-pay | Admitting: Family Medicine

## 2018-12-01 DIAGNOSIS — R197 Diarrhea, unspecified: Secondary | ICD-10-CM | POA: Diagnosis not present

## 2018-12-01 DIAGNOSIS — E860 Dehydration: Secondary | ICD-10-CM | POA: Diagnosis not present

## 2018-12-01 NOTE — Telephone Encounter (Signed)
I left a detailed message stating Tommi Rumps reviewed the message as Dr Ethlyn Gallery is out of the office and he advised she follow up for this in the emergency room.  Message also sent via Mychart message.

## 2018-12-24 ENCOUNTER — Other Ambulatory Visit: Payer: Self-pay

## 2018-12-24 ENCOUNTER — Ambulatory Visit: Payer: BC Managed Care – PPO | Admitting: *Deleted

## 2018-12-24 VITALS — Ht 60.0 in | Wt 130.0 lb

## 2018-12-24 DIAGNOSIS — R1030 Lower abdominal pain, unspecified: Secondary | ICD-10-CM

## 2018-12-24 MED ORDER — PEG 3350-KCL-NA BICARB-NACL 420 G PO SOLR: 4000.0000 mL | Freq: Once | ORAL | 0 refills | Status: AC

## 2018-12-24 NOTE — Progress Notes (Signed)
Patient denies any allergies to egg or soy products. Patient denies complications with anesthesia/sedation.  Patient denies oxygen use at home and denies diet medications.   Pt verified name, DOB, address and insurance during PV today. Pt mailed instruction packet to included paper to complete and mail back to [facility] with addressed and stamped envelope, Emmi video, copy of consent form to read and not return, and instructions.  PV completed over the phone. Pt encouraged to call with questions or concerns. 

## 2018-12-31 DIAGNOSIS — B181 Chronic viral hepatitis B without delta-agent: Secondary | ICD-10-CM | POA: Diagnosis not present

## 2019-01-05 ENCOUNTER — Telehealth: Payer: Self-pay | Admitting: Gastroenterology

## 2019-01-05 NOTE — Telephone Encounter (Signed)
Spoke with patient regarding Covid-19 screening questions. °Covid-19 Screening Questions: °  °Do you now or have you had a fever in the last 14 days? no °  °Do you have any respiratory symptoms of shortness of breath or cough now or in the last 14 days? no °  °Do you have any family members or close contacts with diagnosed or suspected Covid-19 in the past 14 days? no °  °Have you been tested for Covid-19 and found to be positive? yes, Negative °  °Pt made aware of that care partner may wait in the car or come up to the lobby during the procedure but will need to provide their own mask. °

## 2019-01-06 ENCOUNTER — Ambulatory Visit (AMBULATORY_SURGERY_CENTER): Payer: BC Managed Care – PPO | Admitting: Gastroenterology

## 2019-01-06 ENCOUNTER — Encounter: Payer: Self-pay | Admitting: Gastroenterology

## 2019-01-06 ENCOUNTER — Other Ambulatory Visit: Payer: Self-pay

## 2019-01-06 VITALS — BP 122/71 | HR 61 | Temp 98.3°F | Resp 20 | Ht 60.0 in | Wt 130.0 lb

## 2019-01-06 DIAGNOSIS — R109 Unspecified abdominal pain: Secondary | ICD-10-CM | POA: Diagnosis not present

## 2019-01-06 DIAGNOSIS — R1030 Lower abdominal pain, unspecified: Secondary | ICD-10-CM | POA: Diagnosis not present

## 2019-01-06 MED ORDER — SODIUM CHLORIDE 0.9 % IV SOLN: 500.0000 mL | Freq: Once | INTRAVENOUS | Status: DC

## 2019-01-06 NOTE — Progress Notes (Signed)
Pt's states no medical or surgical changes since previsit or office visit.  Temp-June murray  Vital signs-courtney washington 

## 2019-01-06 NOTE — Progress Notes (Signed)
Pt Drowsy. VSS. To PACU, report to RN. No anesthetic complications noted.  

## 2019-01-06 NOTE — Op Note (Signed)
[place] Patient Name: Kim Pacheco Procedure Date: 01/06/2019 12:33 PM MRN: [id number] Endoscopist: Milus Banister , MD Age: 33 Referring MD:  Date of Birth: [birth year] Gender: Female Account #: [id number] Procedure:                Colonoscopy Indications:              Abdominal pain Medicines:                Monitored Anesthesia Care Procedure:                Pre-Anesthesia Assessment:                           - Prior to the procedure, a History and Physical                            was performed, and patient medications and                            allergies were reviewed. The patient's tolerance of                            previous anesthesia was also reviewed. The risks                            and benefits of the procedure and the sedation                            options and risks were discussed with the patient.                            All questions were answered, and informed consent                            was obtained. Prior Anticoagulants: The patient has                            taken no previous anticoagulant or antiplatelet                            agents. ASA Grade Assessment: II - A patient with                            mild systemic disease. After reviewing the risks                            and benefits, the patient was deemed in                            satisfactory condition to undergo the procedure.                           After obtaining informed consent, the colonoscope  was passed under direct vision. Throughout the                            procedure, the patient's blood pressure, pulse, and                            oxygen saturations were monitored continuously. The                            Colonoscope was introduced through the anus and                            advanced to the the terminal ileum. The colonoscopy                            was performed without difficulty.  The patient                            tolerated the procedure well. The quality of the                            bowel preparation was good. The terminal ileum,                            ileocecal valve, appendiceal orifice, and rectum                            were photographed. Scope In: 1:03:58 PM Scope Out: 1:13:17 PM Scope Withdrawal Time: 0 hours 6 minutes 3 seconds  Total Procedure Duration: 0 hours 9 minutes 19 seconds  Findings:                 The terminal ileum appeared normal.                           The entire examined colon appeared normal on direct                            and retroflexion views. Complications:            No immediate complications. Estimated blood loss:                            None. Estimated Blood Loss:     Estimated blood loss: none. Impression:               - The examined portion of the ileum was normal.                           - The entire examined colon is normal on direct and                            retroflexion views.                           - No specimens collected. Recommendation:           -  Patient has a contact number available for                            emergencies. The signs and symptoms of potential                            delayed complications were discussed with the                            patient. Return to normal activities tomorrow.                            Written discharge instructions were provided to the                            patient.                           - Resume previous diet.                           - Continue present medications.                           - Repeat colonoscopy at age 33 for screening.                           - Follow up with GI as needed. Rachael Feeaniel P Jacobs, MD 01/06/2019 1:15:47 PM This report has been signed electronically.

## 2019-01-06 NOTE — Patient Instructions (Signed)
YOU HAD AN ENDOSCOPIC PROCEDURE TODAY AT THE [facility]:   Refer to the procedure report that was given to you for any specific questions about what was found during the examination.  If the procedure report does not answer your questions, please call your gastroenterologist to clarify.  If you requested that your care partner not be given the details of your procedure findings, then the procedure report has been included in a sealed envelope for you to review at your convenience later.  YOU SHOULD EXPECT: Some feelings of bloating in the abdomen. Passage of more gas than usual.  Walking can help get rid of the air that was put into your GI tract during the procedure and reduce the bloating. If you had a lower endoscopy (such as a colonoscopy or flexible sigmoidoscopy) you may notice spotting of blood in your stool or on the toilet paper. If you underwent a bowel prep for your procedure, you may not have a normal bowel movement for a few days.  Please Note:  You might notice some irritation and congestion in your nose or some drainage.  This is from the oxygen used during your procedure.  There is no need for concern and it should clear up in a day or so.  SYMPTOMS TO REPORT IMMEDIATELY:   Following lower endoscopy (colonoscopy or flexible sigmoidoscopy):  Excessive amounts of blood in the stool  Significant tenderness or worsening of abdominal pains  Swelling of the abdomen that is new, acute  Fever of 100F or higher  For urgent or emergent issues, a gastroenterologist can be reached at any hour by calling [phone].   DIET:  We do recommend a small meal at first, but then you may proceed to your regular diet.  Drink plenty of fluids but you should avoid alcoholic beverages for 24 hours.  ACTIVITY:  You should plan to take it easy for the rest of today and you should NOT DRIVE or use heavy machinery until tomorrow (because of the sedation medicines used during the test).     FOLLOW UP: Our staff will call the number listed on your records 48-72 hours following your procedure to check on you and address any questions or concerns that you may have regarding the information given to you following your procedure. If we do not reach you, we will leave a message.  We will attempt to reach you two times.  During this call, we will ask if you have developed any symptoms of COVID 19. If you develop any symptoms (ie: fever, flu-like symptoms, shortness of breath, cough etc.) before then, please call [phone].  If you test positive for Covid 19 in the 2 weeks post procedure, please call and report this information to us.    If any biopsies were taken you will be contacted by phone or by letter within the next 1-3 weeks.  Please call us at [phone] if you have not heard about the biopsies in 3 weeks.    SIGNATURES/CONFIDENTIALITY: You and/or your care partner have signed paperwork which will be entered into your electronic medical record.  These signatures attest to the fact that that the information above on your After Visit Summary has been reviewed and is understood.  Full responsibility of the confidentiality of this discharge information lies with you and/or your care-partner. 

## 2019-01-08 ENCOUNTER — Telehealth: Payer: Self-pay

## 2019-01-08 NOTE — Telephone Encounter (Signed)
  Follow up Call-  Call back number 01/06/2019 07/29/2018  Post procedure Call Back phone  # [phone]  Permission to leave phone message Yes Yes  Some recent data might be hidden     Patient questions:  Do you have a fever, pain , or abdominal swelling? No. Pain Score  0 *  Have you tolerated food without any problems? Yes.    Have you been able to return to your normal activities? Yes.    Do you have any questions about your discharge instructions: Diet   No. Medications  No. Follow up visit  No.  Do you have questions or concerns about your Care? No.  Actions: * If pain score is 4 or above: No action needed, pain <4. 1. Have you developed a fever since your procedure? no  2.   Have you had an respiratory symptoms (SOB or cough) since your procedure? no  3.   Have you tested positive for COVID 19 since your procedure *no  4.   Have you had any family members/close contacts diagnosed with the COVID 19 since your procedure?  no   If yes to any of these questions please route to Joylene John, RN and Alphonsa Gin, Therapist, sports.

## 2019-01-08 NOTE — Telephone Encounter (Signed)
NO ANSWER, MESSAGE LEFT FOR PATIENT. 

## 2019-01-13 DIAGNOSIS — Z1151 Encounter for screening for human papillomavirus (HPV): Secondary | ICD-10-CM | POA: Diagnosis not present

## 2019-01-13 DIAGNOSIS — Z8632 Personal history of gestational diabetes: Secondary | ICD-10-CM | POA: Diagnosis not present

## 2019-01-13 DIAGNOSIS — Z01419 Encounter for gynecological examination (general) (routine) without abnormal findings: Secondary | ICD-10-CM | POA: Diagnosis not present

## 2019-01-13 DIAGNOSIS — Z6824 Body mass index (BMI) 24.0-24.9, adult: Secondary | ICD-10-CM | POA: Diagnosis not present

## 2019-01-13 DIAGNOSIS — N898 Other specified noninflammatory disorders of vagina: Secondary | ICD-10-CM | POA: Diagnosis not present

## 2019-01-13 DIAGNOSIS — O24419 Gestational diabetes mellitus in pregnancy, unspecified control: Secondary | ICD-10-CM | POA: Diagnosis not present

## 2019-01-22 ENCOUNTER — Encounter: Payer: Self-pay | Admitting: Family Medicine

## 2019-01-22 DIAGNOSIS — R1900 Intra-abdominal and pelvic swelling, mass and lump, unspecified site: Secondary | ICD-10-CM | POA: Diagnosis not present

## 2019-01-29 DIAGNOSIS — Z1159 Encounter for screening for other viral diseases: Secondary | ICD-10-CM | POA: Diagnosis not present

## 2019-02-15 ENCOUNTER — Telehealth: Payer: BC Managed Care – PPO | Admitting: Nurse Practitioner

## 2019-02-15 DIAGNOSIS — J0100 Acute maxillary sinusitis, unspecified: Secondary | ICD-10-CM

## 2019-02-15 MED ORDER — AMOXICILLIN-POT CLAVULANATE 875-125 MG PO TABS: 1.0000 | ORAL_TABLET | Freq: Two times a day (BID) | ORAL | 0 refills | Status: DC

## 2019-02-15 NOTE — Progress Notes (Signed)

## 2019-02-19 ENCOUNTER — Encounter: Payer: Self-pay | Admitting: Family Medicine

## 2019-02-22 ENCOUNTER — Encounter: Payer: Self-pay | Admitting: Family Medicine

## 2019-02-23 ENCOUNTER — Telehealth (INDEPENDENT_AMBULATORY_CARE_PROVIDER_SITE_OTHER): Payer: BC Managed Care – PPO | Admitting: Family Medicine

## 2019-02-23 ENCOUNTER — Other Ambulatory Visit: Payer: Self-pay

## 2019-02-23 DIAGNOSIS — R0981 Nasal congestion: Secondary | ICD-10-CM

## 2019-02-23 DIAGNOSIS — H6981 Other specified disorders of Eustachian tube, right ear: Secondary | ICD-10-CM | POA: Diagnosis not present

## 2019-02-23 DIAGNOSIS — R0982 Postnasal drip: Secondary | ICD-10-CM

## 2019-02-23 NOTE — Progress Notes (Signed)
Virtual Visit via Video Note  I connected with Kim Pacheco  on 02/23/19 at  1:00 PM EDT by a video enabled telemedicine application and verified that I am speaking with the correct person using two identifiers.  Location patient: home Location provider:work or home office Persons participating in the virtual visit: patient, provider  I discussed the limitations of evaluation and management by telemedicine and the availability of in person appointments. The patient expressed understanding and agreed to proceed.   HPI:  Acute visit for sinus congestion and ear issues -started about 4 - 5 weeks ago with cough, bodyaches and fevers initially -she was seen at [facility] early on and had a negative COVID test - fevers and body aches resolved -she started amoxicillin for sinusitis last Monday after an e-visit with PCP -she now has lingering clear nasal congestion, some popping or crackling in the R ear with blowing her nose -denies fevers, sinus or ear pain, SOB, decreased energy, malaise  ROS: See pertinent positives and negatives per HPI.  Past Medical History:  Diagnosis Date  . Abdominal pain    started in mid abd and spread to left side/on and off/since Jan 2020/ pt states, she has fluid in abd/unknown  . Abnormal Pap smear    normal 06/2013  . Anxiety   . Constipation    hx of  . Depression    suicide attempt in college  . Diabetes mellitus without complication (HCC)    gestational, no issues since pregnancy per her report - resolved  . Gestational diabetes    /no meds now - resolved  . Hepatitis    Hep B, managed by Dr. Kinnie ScalesMedoff  . History of chicken pox   . History of UTI   . Vaginal Pap smear, abnormal     Past Surgical History:  Procedure Laterality Date  . CESAREAN SECTION N/A 01/26/2013   Procedure: Primary Cesarean Section Delivery Baby  Boy @ 0017, Apgars 4/8/9;  Surgeon: Freddrick MarchKendra H. Tenny Crawoss, MD;  Location: [facility];  Service: Obstetrics;  Laterality: N/A;  . CESAREAN SECTION N/A  11/18/2017   Procedure: CESAREAN SECTION;  Surgeon: [name], Richard, MD;  [facility];  Service: Obstetrics;  Laterality: N/A;  . ESOPHAGOGASTRODUODENOSCOPY ENDOSCOPY    . laproscopy  2020  . NASAL SINUS SURGERY  1996  . TONSILLECTOMY AND ADENOIDECTOMY    . WISDOM TOOTH EXTRACTION      Family History  Adopted: Yes    SOCIAL HX: see hpi   Current Outpatient Medications:  .  amoxicillin-clavulanate (AUGMENTIN) 875-125 MG tablet, Take 1 tablet by mouth 2 (two) times daily., Disp: 14 tablet, Rfl: 0 .  cephALEXin (KEFLEX) 250 MG capsule, Take 250 mg by mouth as needed., Disp: , Rfl:  .  Norethin Ace-Eth Estrad-FE (TAYTULLA PO), Take by mouth daily., Disp: , Rfl:  .  sertraline (ZOLOFT) 100 MG tablet, Take 1 tablet (100 mg total) by mouth daily., Disp: 30 tablet, Rfl: 5 .  tenofovir (VIREAD) 300 MG tablet, Take 300 mg by mouth every evening. , Disp: , Rfl:   EXAM:  VITALS per patient if applicable:denies fever  GENERAL: alert, oriented, appears well and in no acute distress  HEENT: atraumatic, conjunttiva clear, no obvious abnormalities on inspection of external nose and ears, denies pain or cracking in the ear when she tugs on the ear or opens her jaw, normal video exam of the oropharynx, she denies tenderness or lumps over the lymph chains in the neck on pt self exam, no  drainage from the nose or eyes or ears  NECK: normal movements of the head and neck  LUNGS: on inspection no signs of respiratory distress, breathing rate appears normal, no obvious gross SOB, gasping or wheezing  CV: no obvious cyanosis  MS: moves all visible extremities without noticeable abnormality  PSYCH/NEURO: pleasant and cooperative, no obvious depression or anxiety, speech and thought processing grossly intact  ASSESSMENT AND PLAN:  Discussed the following assessment and plan:  Nasal congestion  Dysfunction of right eustachian tube  PND (post-nasal drip)  -we discussed possible  serious and likely etiologies, options for evaluation and workup, limitations of telemedicine visit vs in person visit, treatment, treatment risks and precautions. Pt prefers to treat via telemedicine empirically rather then risking or undertaking an in person visit at this moment. Query post infectious inflammation vs allergic of irritant rhino sinusitis with ETD most likely vs other. Opted to try nasal saline and antihistamine, Syrtec 10mg  nightly for 1-2 weeks. Patient agrees to seek prompt care if worsening, new symptoms arise, or if is not improving with treatment.   I discussed the assessment and treatment plan with the patient. The patient was provided an opportunity to ask questions and all were answered. The patient agreed with the plan and demonstrated an understanding of the instructions.   The patient was advised to call back or seek an in-person evaluation if the symptoms worsen or if the condition fails to improve as anticipated.   [name], DO   Patient Instructions  -nasal saline 1-2 times daily  -zyrtec or Claritin one tablet once daily for 2 weeks  I hope you are feeling better soon! Seek care promptly if your symptoms worsen, new concerns arise or you are not improving with treatment.

## 2019-02-23 NOTE — Patient Instructions (Signed)
-  nasal saline 1-2 times daily  -zyrtec or Claritin one tablet once daily for 2 weeks  I hope you are feeling better soon! Seek care promptly if your symptoms worsen, new concerns arise or you are not improving with treatment.

## 2019-03-16 ENCOUNTER — Encounter: Payer: Self-pay | Admitting: Family Medicine

## 2019-03-16 NOTE — Telephone Encounter (Signed)
I called the pt and informed her of the message below.  Appt scheduled for 10/7 at 1pm.

## 2019-03-16 NOTE — Telephone Encounter (Signed)
Please see if she can do a video appt today or tomorrow. Thanks.

## 2019-03-17 ENCOUNTER — Other Ambulatory Visit: Payer: Self-pay

## 2019-03-17 ENCOUNTER — Telehealth (INDEPENDENT_AMBULATORY_CARE_PROVIDER_SITE_OTHER): Payer: BC Managed Care – PPO | Admitting: Family Medicine

## 2019-03-17 DIAGNOSIS — R195 Other fecal abnormalities: Secondary | ICD-10-CM

## 2019-03-17 DIAGNOSIS — R1084 Generalized abdominal pain: Secondary | ICD-10-CM

## 2019-03-17 NOTE — Progress Notes (Signed)
Virtual Visit via Video Note  I connected with Kim Pacheco on 03/17/19 at  1:00 PM EDT by a video enabled telemedicine application 2/2 [id number] pandemic and verified that I am speaking with the correct person using two identifiers.  Location patient: home Location provider:work or home office Persons participating in the virtual visit: patient, provider  I discussed the limitations of evaluation and management by telemedicine and the availability of in person appointments. The patient expressed understanding and agreed to proceed.   HPI: Pt is a 33 yo female with pmh sig for Hep B, depression, anxiety, h/o gDM seen by Dr. Ethlyn Gallery.  Pt seen for acute concern of mild stomach ache for the last few days.  Started on Saturday.  Noticing mucus on toiler paper.  The abdominal pain is dull, occurs right before has to have a BM.  Pt having frequent mushy stools.  Drinking 1-2 glasses of water per day.  Increased flatus.  Pt denies fever, chills, n/v, belching, blood in stools.  Pt eating more diary- yogurt in am and a glass of milk at night, started doing this ~ 1 wk ago.  Pt mentions as a child mild would make her vomit.  She was later able to drink it without issue.  Pt is unsure of FMHx as she is adopted.  Denies eating any new foods.  Pt followed by GI in the past for abdominal pain- had EGD, colonoscopy.     ROS: See pertinent positives and negatives per HPI.  Past Medical History:  Diagnosis Date  . Abdominal pain    started in mid abd and spread to left side/on and off/since Jan 2020/ pt states, she has fluid in abd/unknown  . Abnormal Pap smear    normal 06/2013  . Anxiety   . Constipation    hx of  . Depression    suicide attempt in college  . Diabetes mellitus without complication (HCC)    gestational, no issues since pregnancy per her report - resolved  . Gestational diabetes    /no meds now - resolved  . Hepatitis    Hep B, managed by Dr. Earlean Shawl  . History of chicken  pox   . History of UTI   . Vaginal Pap smear, abnormal     Past Surgical History:  Procedure Laterality Date  . CESAREAN SECTION N/A 01/26/2013   Procedure: Primary Cesarean Section Delivery Baby  Boy @ 0017, Apgars 4/8/9;  Surgeon: Farrel Gobble. Harrington Challenger, MD;  Location: [facility];  Service: Obstetrics;  Laterality: N/A;  . CESAREAN SECTION N/A 11/18/2017   Procedure: CESAREAN SECTION;  Surgeon: Brien Few, MD;  Location: [facility];  Service: Obstetrics;  Laterality: N/A;  . ESOPHAGOGASTRODUODENOSCOPY ENDOSCOPY    . laproscopy  2020  . NASAL SINUS SURGERY  1996  . TONSILLECTOMY AND ADENOIDECTOMY    . WISDOM TOOTH EXTRACTION      Family History  Adopted: Yes      Current Outpatient Medications:  .  amoxicillin-clavulanate (AUGMENTIN) 875-125 MG tablet, Take 1 tablet by mouth 2 (two) times daily., Disp: 14 tablet, Rfl: 0 .  cephALEXin (KEFLEX) 250 MG capsule, Take 250 mg by mouth as needed., Disp: , Rfl:  .  Norethin Ace-Eth Estrad-FE (TAYTULLA PO), Take by mouth daily., Disp: , Rfl:  .  sertraline (ZOLOFT) 100 MG tablet, Take 1 tablet (100 mg total) by mouth daily., Disp: 30 tablet, Rfl: 5 .  tenofovir (VIREAD) 300 MG tablet, Take 300 mg by mouth every evening. ,  Disp: , Rfl:   EXAM:  VITALS per patient if applicable:  GENERAL: alert, oriented, appears well and in no acute distress  HEENT: atraumatic, conjunctiva clear, no obvious abnormalities on inspection of external nose and ears  NECK: normal movements of the head and neck  LUNGS: on inspection no signs of respiratory distress, breathing rate appears normal, no obvious gross SOB, gasping or wheezing  CV: no obvious cyanosis  MS: moves all visible extremities without noticeable abnormality  PSYCH/NEURO: pleasant and cooperative, no obvious depression or anxiety, speech and thought processing grossly intact  ASSESSMENT AND PLAN:  Discussed the following assessment and plan:  Generalized abdominal  pain -discussed possible causes including lactose intolerance, IBS, infectious causes -likely 2/2 increased diary intake. -pt encouraged to keep a food diary.  Also discussed decreasing diary intake to see if symptoms improve. -consider lactose free milk or lactaid pills -pt also encouraged to increase po intake of water. -given precautions  Increased mucus in stool  F/u in the next 1-2 wks for continued symptoms    I discussed the assessment and treatment plan with the patient. The patient was provided an opportunity to ask questions and all were answered. The patient agreed with the plan and demonstrated an understanding of the instructions.   The patient was advised to call back or seek an in-person evaluation if the symptoms worsen or if the condition fails to improve as anticipat   Deeann Saint, MD

## 2019-04-08 ENCOUNTER — Other Ambulatory Visit: Payer: Self-pay

## 2019-04-08 ENCOUNTER — Encounter: Payer: Self-pay | Admitting: Family Medicine

## 2019-04-08 ENCOUNTER — Telehealth (INDEPENDENT_AMBULATORY_CARE_PROVIDER_SITE_OTHER): Payer: BC Managed Care – PPO | Admitting: Family Medicine

## 2019-04-08 DIAGNOSIS — R300 Dysuria: Secondary | ICD-10-CM | POA: Diagnosis not present

## 2019-04-08 MED ORDER — NITROFURANTOIN MONOHYD MACRO 100 MG PO CAPS: 100.0000 mg | ORAL_CAPSULE | Freq: Two times a day (BID) | ORAL | 0 refills | Status: DC

## 2019-04-08 NOTE — Patient Instructions (Signed)
-  I sent the medication(s) we discussed to your pharmacy: °Meds ordered this encounter  °Medications  °• nitrofurantoin, macrocrystal-monohydrate, (MACROBID) 100 MG capsule  °  Sig: Take 1 capsule (100 mg total) by mouth 2 (two) times daily.  °  Dispense:  14 capsule  °  Refill:  0  ° ° °Please let us know if you have any questions or concerns regarding this prescription. ° °I hope you are feeling better soon! °Seek care promptly if your symptoms worsen, new concerns arise or you are not improving with treatment. ° °

## 2019-04-08 NOTE — Progress Notes (Signed)
Virtual Visit via Video Note  I connected with Kim Pacheco  on 04/08/19 at 11:40 AM EDT by a video enabled telemedicine application and verified that I am speaking with the correct person using two identifiers.  Location patient: car Location provider:work or home office Persons participating in the virtual visit: patient, provider  I discussed the limitations of evaluation and management by telemedicine and the availability of in person appointments. The patient expressed understanding and agreed to proceed.   HPI:  Acute visit for Dysuria: -started yesterday -feels has UTI, has had in the past -symptoms include urinary urgency and frequency and mild burning with urination -denies fevers, malaise, flank pain, hematuria, NV or concern for STI/vaginal symptoms -feels the same as prior UTI and prefers empiric tx   ROS: See pertinent positives and negatives per HPI.  Past Medical History:  Diagnosis Date  . Abdominal pain    started in mid abd and spread to left side/on and off/since Jan 2020/ pt states, she has fluid in abd/unknown  . Abnormal Pap smear    normal 06/2013  . Anxiety   . Constipation    hx of  . Depression    suicide attempt in college  . Diabetes mellitus without complication (HCC)    gestational, no issues since pregnancy per her report - resolved  . Gestational diabetes    /no meds now - resolved  . Hepatitis    Hep B, managed by Dr. Kinnie Scales  . History of chicken pox   . History of UTI   . Vaginal Pap smear, abnormal     Past Surgical History:  Procedure Laterality Date  . CESAREAN SECTION N/A 01/26/2013   Procedure: Primary Cesarean Section Delivery Baby  Boy @ 0017, Apgars 4/8/9;  Surgeon: Freddrick March. Tenny Craw, MD;  Location: [facility];  Service: Obstetrics;  Laterality: N/A;  . CESAREAN SECTION N/A 11/18/2017   Procedure: CESAREAN SECTION;  Surgeon: Olivia Mackie, MD;  Location: [facility];  Service: Obstetrics;  Laterality: N/A;  .  ESOPHAGOGASTRODUODENOSCOPY ENDOSCOPY    . laproscopy  2020  . NASAL SINUS SURGERY  1996  . TONSILLECTOMY AND ADENOIDECTOMY    . WISDOM TOOTH EXTRACTION      Family History  Adopted: Yes    SOCIAL HX: see hpi   Current Outpatient Medications:  .  cephALEXin (KEFLEX) 250 MG capsule, Take 250 mg by mouth as needed., Disp: , Rfl:  .  Norethin Ace-Eth Estrad-FE (TAYTULLA PO), Take by mouth daily., Disp: , Rfl:  .  sertraline (ZOLOFT) 100 MG tablet, Take 1 tablet (100 mg total) by mouth daily., Disp: 30 tablet, Rfl: 5 .  tenofovir (VIREAD) 300 MG tablet, Take 300 mg by mouth every evening. , Disp: , Rfl:  .  nitrofurantoin, macrocrystal-monohydrate, (MACROBID) 100 MG capsule, Take 1 capsule (100 mg total) by mouth 2 (two) times daily., Disp: 14 capsule, Rfl: 0  EXAM:  VITALS per patient if applicable:  GENERAL: alert, oriented, appears well and in no acute distress  HEENT: atraumatic, conjunttiva clear, no obvious abnormalities on inspection of external nose and ears  NECK: normal movements of the head and neck  LUNGS: on inspection no signs of respiratory distress, breathing rate appears normal, no obvious gross SOB, gasping or wheezing  CV: no obvious cyanosis  MS: moves all visible extremities without noticeable abnormality  PSYCH/NEURO: pleasant and cooperative, no obvious depression or anxiety, speech and thought processing grossly intact  ASSESSMENT AND PLAN:  Discussed the following assessment and plan:  Dysuria  -we discussed possible serious and likely etiologies, options for evaluation and workup, limitations of telemedicine visit vs in person visit, treatment, treatment risks and precautions. Pt prefers to treat via telemedicine empirically rather then risking or undertaking an in person visit at this moment. Opted for empiric treatment of possible UTI per patient preference with Macrobid 100mg  bid x 7 days. Patient agrees to seek prompt in person care if worsening,  new symptoms arise, or if is not improving with treatment over the next few days.   I discussed the assessment and treatment plan with the patient. The patient was provided an opportunity to ask questions and all were answered. The patient agreed with the plan and demonstrated an understanding of the instructions.   The patient was advised to call back or seek an in-person evaluation if the symptoms worsen or if the condition fails to improve as anticipated.   [name], DO   Patient Instructions   -I sent the medication(s) we discussed to your pharmacy:  Meds ordered this encounter  Medications  . nitrofurantoin, macrocrystal-monohydrate, (MACROBID) 100 MG capsule    Sig: Take 1 capsule (100 mg total) by mouth 2 (two) times daily.    Dispense:  14 capsule    Refill:  0   Please let us know if you have any questions or concerns regarding this prescription.  I hope you are feeling better soon! Seek care promptly if your symptoms worsen, new concerns arise or you are not improving with treatment.

## 2019-04-08 NOTE — Telephone Encounter (Signed)
I called the pt and scheduled an appt for today at 11:40am. 

## 2019-05-08 DIAGNOSIS — K2950 Unspecified chronic gastritis without bleeding: Secondary | ICD-10-CM

## 2019-07-14 ENCOUNTER — Telehealth (INDEPENDENT_AMBULATORY_CARE_PROVIDER_SITE_OTHER): Payer: BC Managed Care – PPO | Admitting: Family Medicine

## 2019-07-14 ENCOUNTER — Encounter: Payer: Self-pay | Admitting: Family Medicine

## 2019-07-14 DIAGNOSIS — B1910 Unspecified viral hepatitis B without hepatic coma: Secondary | ICD-10-CM | POA: Diagnosis not present

## 2019-07-14 DIAGNOSIS — F1010 Alcohol abuse, uncomplicated: Secondary | ICD-10-CM | POA: Diagnosis not present

## 2019-07-14 DIAGNOSIS — F321 Major depressive disorder, single episode, moderate: Secondary | ICD-10-CM | POA: Diagnosis not present

## 2019-07-14 MED ORDER — SERTRALINE HCL 50 MG PO TABS: 25.0000 mg | ORAL_TABLET | Freq: Every day | ORAL | Status: DC

## 2019-07-14 NOTE — Telephone Encounter (Signed)
I spoke with patient today with telephone encounter. Restarted zoloft at 25mg  daily; encouraged to take in evening. See telephone note.

## 2019-07-14 NOTE — Telephone Encounter (Addendum)
Virtual Visit via Telephone Note  I connected with Kim Pacheco [place]  on 07/14/19 at 3:30  by telephone and verified that I am speaking with the correct person using two identifiers.   I discussed the limitations, risks, security and privacy concerns of performing an evaluation and management service by telephone and the availability of in person appointments. I also discussed with the patient that there may be a patient responsible charge related to this service. The patient expressed understanding and agreed to proceed.  Location patient: home Location provider: work office Participants present for the call: patient, provider Patient did not have a visit in the prior 7 days to address this/these issue(s).   History of Present Illness:  Hasn't been taking medications since September.   Right now with job things are quiet. Feeling unmotivated, not wanting to do much. Feeling depressed, sad, tired.   Beginning of jan had wedding show she was focused on and preparing for; once this happened then things slowed down for her.   Not sure why she stopped taking medications. Did try to take them again, but made her feel sick after being off of these.   Had a thought other night about hurting herself, so that was why husbnd encouraged her to call us. Monday night husband went out with friends and she was at home drinking wine, watching things on phone. Has seen therapist in the past, but hasn't really followed regularly with anyone.   Has been having trouble sleeping. One of friends gave her all natural capsule to help with sleep and one to promote good skin. Sleeps better through night with this medication. Does feel rested in morning.   Does have full bottle of zoloft but this is 50mg .  Was on ocp for a year. Husband had vasectomy. Only reason she would need it is for period regulation. Has always had irregular periods. Doesn't get more than once a month. More like every other month.    [place] did want her to get a scan of her liver, but happened during COVID so didn't get to make follow up.    Observations/Objective: Patient sounds cheerful and well on the phone. I do not appreciate any SOB. Speech and thought processing are grossly intact. Patient reported vitals: PHQ9: 12  Assessment and Plan: 1. Depression, major, single episode, moderate (HCC) Restart zoloft at half tab (25mg ). Discussed new medication(s) today with patient. Discussed potential side effects and patient verbalized understanding. She agrees to tell husband if any increased thoughts of self harm.   2. Alcohol abuse I discussed the complications of excessive alcohol on mood, sleep, and liver.  She is not certain that she is able to cut back on alcohol at this point, but is willing to try to decrease intake.  Her alcohol use is increased from a glass to a bottle daily.  3. Viral hepatitis B with hepatitis delta, without hepatic coma, unspecified chronicity Encouraged her to call her specialist at [facility] for [facility]-up.  Patient states she was post to have repeat imaging of the liver.  She has stopped her tenofovir. She will need to follow up with them. She did complete extensive evaluation from GI with regards to abdominal pain last year including: EGD, colonoscopy.  There is no fluid on ascites tap for dialysis.   Follow Up Instructions:  Visit in 2 weeks for mood follow up.  [id number] [id number] [id number] 11-20 [id number] 21-30 I did not refer this patient for an OV in the next  24 hours for this/these issue(s).  I discussed the assessment and treatment plan with the patient. The patient was provided an opportunity to ask questions and all were answered. The patient agreed with the plan and demonstrated an understanding of the instructions.   The patient was advised to call back or seek an in-person evaluation if the symptoms worsen or if the condition fails to improve as anticipated.  I provided 23  minutes of non-face-to-face time during this encounter.   Theodis Shove, MD

## 2019-07-15 NOTE — Telephone Encounter (Signed)
I left a detailed message at the pts cell number to call the office to schedule an appt as below.

## 2019-07-16 ENCOUNTER — Other Ambulatory Visit: Payer: Self-pay | Admitting: Family Medicine

## 2019-07-16 ENCOUNTER — Encounter: Payer: Self-pay | Admitting: Family Medicine

## 2019-07-16 MED ORDER — BUPROPION HCL ER (XL) 150 MG PO TB24: 150.0000 mg | ORAL_TABLET | Freq: Every day | ORAL | 1 refills | Status: DC

## 2019-07-16 NOTE — Telephone Encounter (Signed)
Spoke with patient. She is troubled enough by side effects that she would like to switch medications. We are going to try wellbutrin instead. She will let me know if any trouble with this. Discussed new medication(s) today with patient. Discussed potential side effects and patient verbalized understanding.

## 2019-07-28 ENCOUNTER — Telehealth (INDEPENDENT_AMBULATORY_CARE_PROVIDER_SITE_OTHER): Payer: BC Managed Care – PPO | Admitting: Family Medicine

## 2019-07-28 ENCOUNTER — Other Ambulatory Visit: Payer: Self-pay

## 2019-07-28 ENCOUNTER — Telehealth: Payer: Self-pay | Admitting: *Deleted

## 2019-07-28 DIAGNOSIS — F321 Major depressive disorder, single episode, moderate: Secondary | ICD-10-CM | POA: Diagnosis not present

## 2019-07-28 NOTE — Progress Notes (Signed)
Virtual Visit via Video Note  I connected with Kim Pacheco  on 07/28/19 at  2:30 PM EST by a video enabled telemedicine application and verified that I am speaking with the correct person using two identifiers.  Location patient: home Location provider:work or home office Persons participating in the virtual visit: patient, provider  I discussed the limitations of evaluation and management by telemedicine and the availability of in person appointments. The patient expressed understanding and agreed to proceed.   Kim Pacheco DOB: 25-Jan-1986 Encounter date: 07/28/2019  This is a 34 y.o. female who presents with No chief complaint on file.   History of present illness: Doing better with the wellbutrin. Not having the nausea or sexual side effects she did with zoloft. Feels that she is adjusting a little better and that mood is doing better.   Motivation is ok; she has not had any additional thoughts of hurting herself.   She is heading into wedding season which is stressful but she feels times when her and husband are working are challenging. Usually friends are willing to help with evening and weekend child care.   Sleep is doing better overall. Last couple of nights has had some weird dreams.   She has decreased wine intake to half a bottle/night from full bottle last time we talked.   Allergies  Allergen Reactions  . Contrast Media [Iodinated Diagnostic Agents] Shortness Of Breath and Rash    IV contrast    No outpatient medications have been marked as taking for the 07/28/19 encounter (Appointment) with Wynn Banker, MD.    Review of Systems  Constitutional: Negative for chills, fatigue and fever.  Respiratory: Negative for cough, chest tightness, shortness of breath and wheezing.   Cardiovascular: Negative for chest pain, palpitations and leg swelling.  Psychiatric/Behavioral: Negative for sleep disturbance and suicidal ideas. The patient is  not nervous/anxious.     Objective:  There were no vitals taken for this visit.      BP Readings from Last 3 Encounters:  01/06/19 122/71  07/29/18 104/60  07/08/18 100/66   Wt Readings from Last 3 Encounters:  01/06/19 130 lb (59 kg)  12/24/18 130 lb (59 kg)  07/29/18 127 lb (57.6 kg)    EXAM:  GENERAL: alert, oriented, appears well and in no acute distress  HEENT: atraumatic, conjunctiva clear, no obvious abnormalities on inspection of external nose and ears  NECK: normal movements of the head and neck  LUNGS: on inspection no signs of respiratory distress, breathing rate appears normal, no obvious gross SOB, gasping or wheezing  CV: no obvious cyanosis  MS: moves all visible extremities without noticeable abnormality  PSYCH/NEURO: pleasant and cooperative, no obvious depression or anxiety, speech and thought processing grossly intact   Assessment/Plan 1. Depression, major, single episode, moderate (HCC) Improving; will continue wellbutrin at current dose.   Return in about 1 month (around 08/25/2019). We will recheck mood at that point; let me know if any worsening in the meanwhile.     I discussed the assessment and treatment plan with the patient. The patient was provided an opportunity to ask questions and all were answered. The patient agreed with the plan and demonstrated an understanding of the instructions.   The patient was advised to call back or seek an in-person evaluation if the symptoms worsen or if the condition fails to improve as anticipated.  I provided 15 minutes of non-face-to-face time during this encounter.   Theodis Shove, MD

## 2019-07-28 NOTE — Telephone Encounter (Signed)
Spoke with the pt and scheduled an appt for 3/19 to arrive at 8:15am.

## 2019-07-28 NOTE — Telephone Encounter (Signed)
-----   Message from Wynn Banker, MD sent at 07/28/2019  2:45 PM EST ----- Please schedule physical in office in 1 month.

## 2019-08-08 ENCOUNTER — Telehealth: Payer: Self-pay | Admitting: Emergency Medicine

## 2019-08-08 DIAGNOSIS — J029 Acute pharyngitis, unspecified: Secondary | ICD-10-CM

## 2019-08-08 DIAGNOSIS — R6889 Other general symptoms and signs: Secondary | ICD-10-CM

## 2019-08-08 NOTE — Progress Notes (Signed)
E-Visit for Corona Virus Screening  Your current symptoms could be consistent with the coronavirus.  Many health care providers can now test patients at their office but not all are.  [facility] has multiple testing sites. For information on our [place] testing locations and hours go to [email or web address]  We are enrolling you in our [place] for [place] . Daily you will receive a questionnaire within the [facility] website. Our COVID 19 response team will be monitoring your responses daily.  Testing Information: The COVID-19 Community Testing sites will begin testing BY APPOINTMENT ONLY.  You can schedule online at [email or web address]  If you do not have access to a smart phone or computer you may call [phone] for an appointment.   Additional testing sites in the Community:  . For CV[facility] Testing sites in [facility]  FaceUpdate.[email or web address]  . For Pop-up testing sites in [place]  BowlDirectory.co.uk  . For Testing sites with regular hours [email or web address]  . For [place] [email or web address]  . For [facility] [email or web address]  . For [facility] testing in [place] and [employer] [email or web address]  . For [facility] testing in [facility]   [email or web address]  For  more information about community testing call [phone]   Please quarantine yourself while awaiting your test results. Please stay home for a minimum of 10 days from the first day of illness with improving symptoms and you have had 24 hours of no fever (without the use of Tylenol (Acetaminophen)  Motrin (Ibuprofen) or any fever reducing medication).  Also - Do not get tested prior to returning to work because once you have had a positive test the test can stay positive for more then a month in some cases.   You should wear a mask or cloth face covering over your nose and mouth if you must be around other people or animals, including pets (even at home). Try to stay at least 6 feet away from other people. This will protect the people around you.  Please continue good preventive care measures, including:  frequent hand-washing, avoid touching your face, cover coughs/sneezes, stay out of crowds and keep a 6 foot distance from others.  COVID-19 is a respiratory illness with symptoms that are similar to the flu. Symptoms are typically mild to moderate, but there have been cases of severe illness and death due to the virus.   The following symptoms may appear 2-14 days after exposure: . Fever . Cough . Shortness of breath or difficulty breathing . Chills . Repeated shaking with chills . Muscle pain . Headache . Sore throat . New loss of taste or smell . Fatigue . Congestion or runny nose . Nausea or vomiting . Diarrhea  Go to the nearest hospital ED for assessment if fever/cough/breathlessness are severe or illness seems like a threat to life.  It is vitally important that if you feel that you have an infection such as this virus or any other virus that you stay home and away from places where you may spread it to others.  You should avoid contact with people age 51 and older.    You can use medication such as Motrin and Tylenol, or over the counter cold and flu medications. Over the counter sore throat lozenges or Cepacol spray, warm liquids like tea and soup, popsicles, salt water gargle.   Reduce your risk of any infection by using the same precautions used for avoiding the common cold or flu:  Marland Kitchen Wash your hands  often with soap and warm water for at least 20 seconds.  If soap and water  are not readily available, use an alcohol-based hand sanitizer with at least 60% alcohol.  . If coughing or sneezing, cover your mouth and nose by coughing or sneezing into the elbow areas of your shirt or coat, into a tissue or into your sleeve (not your hands). . Avoid shaking hands with others and consider head nods or verbal greetings only. . Avoid touching your eyes, nose, or mouth with unwashed hands.  . Avoid close contact with people who are sick. . Avoid places or events with large numbers of people in one location, like concerts or sporting events. . Carefully consider travel plans you have or are making. . If you are planning any travel outside or inside the [place], visit the CDC's Travelers' Health webpage for the latest health notices. . If you have some symptoms but not all symptoms, continue to monitor at home and seek medical attention if your symptoms worsen. . If you are having a medical emergency, call 911.  HOME CARE . Only take medications as instructed by your medical team. . Drink plenty of fluids and get plenty of rest. . A steam or ultrasonic humidifier can help if you have congestion.   GET HELP RIGHT AWAY IF YOU HAVE EMERGENCY WARNING SIGNS** FOR COVID-19. If you or someone is showing any of these signs seek emergency medical care immediately. Call 911 or proceed to your closest emergency facility if: . You develop worsening high fever. . Trouble breathing . Bluish lips or face . Persistent pain or pressure in the chest . New confusion . Inability to wake or stay awake . You cough up blood. . Your symptoms become more severe  **This list is not all possible symptoms. Contact your medical provider for any symptoms that are sever or concerning to you.  MAKE SURE YOU   Understand these instructions.  Will watch your condition.  Will get help right away if you are not doing well or get worse.  Your e-visit answers were reviewed by a board certified advanced  clinical practitioner to complete your personal care plan.  Depending on the condition, your plan could have included both over the counter or prescription medications.  If there is a problem please reply once you have received a response from your provider.  Your safety is important to [place].  If you have drug allergies check your prescription carefully.    You can use MyChart to ask questions about today's visit, request a non-urgent call back, or ask for a work or school excuse for 24 hours related to this e-Visit. If it has been greater than 24 hours you will need to follow up with your provider, or enter a new e-Visit to address those concerns. You will get an e-mail in the next two days asking about your experience.  I hope that your e-visit has been valuable and will speed your recovery. Thank you for using e-visits.     Greater than 5 but less than 10 minutes spent researching, coordinating, and implementing care for this patient today

## 2019-08-09 ENCOUNTER — Telehealth: Payer: BC Managed Care – PPO | Admitting: Physician Assistant

## 2019-08-09 ENCOUNTER — Ambulatory Visit: Payer: BC Managed Care – PPO | Attending: Internal Medicine

## 2019-08-09 DIAGNOSIS — Z20822 Contact with and (suspected) exposure to covid-19: Secondary | ICD-10-CM

## 2019-08-09 DIAGNOSIS — J029 Acute pharyngitis, unspecified: Secondary | ICD-10-CM

## 2019-08-09 MED ORDER — PREDNISONE 20 MG PO TABS: 20.0000 mg | ORAL_TABLET | Freq: Every day | ORAL | 0 refills | Status: AC

## 2019-08-09 NOTE — Progress Notes (Signed)
E-Visit for Corona Virus Screening  Your current symptoms could be consistent with the coronavirus.   In reviewing your chart, it appears that you were seen for a similar complaint yesterday and have a COVID test pending. If you are continuing to have a sore throat and muscle aches you may rotate Tylenol and Motrin for your pain. You can also gargle with warm salt water to help with the sore throat.     Many health care providers can now test patients at their office but not all are.  [facility] has multiple testing sites. For information on our COVID testing locations and hours go to [email or web address]  We are enrolling you in our MyChart Home Monitoring for COVID19 . Daily you will receive a questionnaire within the MyChart website. Our COVID 19 response team will be monitoring your responses daily.  Testing Information: The COVID-19 Community Testing sites will begin testing BY APPOINTMENT ONLY.  You can schedule online at [email or web address]  If you do not have access to a smart phone or computer you may call [phone] for an appointment.   Additional testing sites in the Community:  . For CVS Testing sites in [facility]  [email or web address].[email or web address]  . For Pop-up testing sites in [place]  [email or web address]  . For Testing sites with regular hours [email or web address]  . For [facility] [place] [email or web address]  . For [facility] [email or web address].dk  . For [facility] testing in [place] and [employer] [email or web address].dk  . For [facility] testing in [facility]    [email or web address]  For  more information about community testing call [phone]   Please quarantine yourself while awaiting your test results. Please stay home for a minimum of 10 days from the first day of illness with improving symptoms and you have had 24 hours of no fever (without the use of Tylenol (Acetaminophen) Motrin (Ibuprofen) or any fever reducing medication).  Also - Do not get tested prior to returning to work because once you have had a positive test the test can stay positive for more then a month in some cases.   You should wear a mask or cloth face covering over your nose and mouth if you must be around other people or animals, including pets (even at home). Try to stay at least 6 feet away from other people. This will protect the people around you.  Please continue good preventive care measures, including:  frequent hand-washing, avoid touching your face, cover coughs/sneezes, stay out of crowds and keep a 6 foot distance from others.  COVID-19 is a respiratory illness with symptoms that are similar to the flu. Symptoms are typically mild to moderate, but there have been cases of severe illness and death due to the virus.   The following symptoms may appear 2-14 days after exposure: . Fever . Cough . Shortness of breath or difficulty breathing . Chills . Repeated shaking with chills . Muscle pain . Headache . Sore throat . New loss of taste or smell . Fatigue . Congestion or runny nose . Nausea or vomiting . Diarrhea  Go to the nearest hospital ED for assessment if fever/cough/breathlessness are severe or illness seems like a threat to life.  It is vitally important that if you feel that you have an infection such as this virus or any other virus that you stay home and away from places where you may spread it to others.  You should avoid contact with people age 36 and older.   You may  also take acetaminophen (Tylenol) as needed for fever.  Reduce your  risk of any infection by using the same precautions used for avoiding the common cold or flu:  Marland Kitchen Wash your hands often with soap and warm water for at least 20 seconds.  If soap and water are not readily available, use an alcohol-based hand sanitizer with at least 60% alcohol.  . If coughing or sneezing, cover your mouth and nose by coughing or sneezing into the elbow areas of your shirt or coat, into a tissue or into your sleeve (not your hands). . Avoid shaking hands with others and consider head nods or verbal greetings only. . Avoid touching your eyes, nose, or mouth with unwashed hands.  . Avoid close contact with people who are sick. . Avoid places or events with large numbers of people in one location, like concerts or sporting events. . Carefully consider travel plans you have or are making. . If you are planning any travel outside or inside the [place], visit the CDC's Travelers' Health webpage for the latest health notices. . If you have some symptoms but not all symptoms, continue to monitor at home and seek medical attention if your symptoms worsen. . If you are having a medical emergency, call 911.  HOME CARE . Only take medications as instructed by your medical team. . Drink plenty of fluids and get plenty of rest. . A steam or ultrasonic humidifier can help if you have congestion.   GET HELP RIGHT AWAY IF YOU HAVE EMERGENCY WARNING SIGNS** FOR COVID-19. If you or someone is showing any of these signs seek emergency medical care immediately. Call 911 or proceed to your closest emergency facility if: . You develop worsening high fever. . Trouble breathing . Bluish lips or face . Persistent pain or pressure in the chest . New confusion . Inability to wake or stay awake . You cough up blood. . Your symptoms become more severe  **This list is not all possible symptoms. Contact your medical provider for any symptoms that are sever or concerning to you.  MAKE SURE YOU   Understand  these instructions.  Will watch your condition.  Will get help right away if you are not doing well or get worse.  Your e-visit answers were reviewed by a board certified advanced clinical practitioner to complete your personal care plan.  Depending on the condition, your plan could have included both over the counter or prescription medications.  If there is a problem please reply once you have received a response from your provider.  Your safety is important to [place].  If you have drug allergies check your prescription carefully.    You can use MyChart to ask questions about today's visit, request a non-urgent call back, or ask for a work or school excuse for 24 hours related to this e-Visit. If it has been greater than 24 hours you will need to follow up with your provider, or enter a new e-Visit to address those concerns. You will get an e-mail in the next two days asking about your experience.  I hope that your e-visit has been valuable and will speed your recovery. Thank you for using e-visits.  Approximately 5 minutes was spent documenting and reviewing patient's chart.

## 2019-08-09 NOTE — Addendum Note (Signed)
Addended by: Karrie Meres on: 08/09/2019 02:01 PM   Modules accepted: Orders

## 2019-08-10 LAB — NOVEL CORONAVIRUS, NAA: SARS-CoV-2, NAA: NOT DETECTED

## 2019-08-15 ENCOUNTER — Telehealth: Payer: BC Managed Care – PPO | Admitting: Emergency Medicine

## 2019-08-15 DIAGNOSIS — H6690 Otitis media, unspecified, unspecified ear: Secondary | ICD-10-CM

## 2019-08-15 MED ORDER — AMOXICILLIN-POT CLAVULANATE 875-125 MG PO TABS: 1.0000 | ORAL_TABLET | Freq: Two times a day (BID) | ORAL | 0 refills | Status: AC

## 2019-08-15 NOTE — Addendum Note (Signed)
Addended by: Arthor Captain on: 08/15/2019 02:53 PM   Modules accepted: Orders

## 2019-08-15 NOTE — Progress Notes (Signed)
E Visit for ear infection  We are sorry that you are not feeling well. Here is how we plan to help!   Augmentin 625mg  one tablet by mouth twice a day for 10 days  In certain cases ear infections may progress to a more serious bacterial infection of the middle or inner ear.  If you have a fever 102 and up and significantly worsening symptoms, this could indicate a more serious infection moving to the middle/inner and needs face to face evaluation in an office by a provider.  Your symptoms should improve over the next 3 days and should resolve in about 7 days.  HOME CARE:   Wash your hands frequently.  Do not place the tip of the bottle on your ear or touch it with your fingers.  You can take Acetominophen 650 mg every 4-6 hours as needed for pain.  If pain is severe or moderate, you can apply a heating pad (set on low) or hot water bottle (wrapped in a towel) to outer ear for 20 minutes.  This will also increase drainage.  Avoid ear plugs  Do not use Q-tips  After showers, help the water run out by tilting your head to one side.  GET HELP RIGHT AWAY IF:   Fever is over 102.2 degrees.  You develop progressive ear pain or hearing loss.  Ear symptoms persist longer than 3 days after treatment.  MAKE SURE YOU:   Understand these instructions.  Will watch your condition.  Will get help right away if you are not doing well or get worse.  TO PREVENT SWIMMER'S EAR:  Use a bathing cap or custom fitted swim molds to keep your ears dry.  Towel off after swimming to dry your ears.  Tilt your head or pull your earlobes to allow the water to escape your ear canal.  If there is still water in your ears, consider using a hairdryer on the lowest setting.  Thank you for choosing an e-visit. Your e-visit answers were reviewed by a board certified advanced clinical practitioner to complete your personal care plan. Depending upon the condition, your plan could have included both over  the counter or prescription medications. Please review your pharmacy choice. Be sure that the pharmacy you have chosen is open so that you can pick up your prescription now.  If there is a problem you may message your provider in MyChart to have the prescription routed to another pharmacy. Your safety is important to . If you have drug allergies check your prescription carefully.  For the next 24 hours, you can use MyChart to ask questions about today's visit, request a non-urgent call back, or ask for a work or school excuse from your e-visit provider. You will get an email in the next two days asking about your experience. I hope that your e-visit has been valuable and will speed your recovery.   Greater than 5 but less than 10 minutes spent researching, coordinating, and implementing care for this patient today

## 2019-08-15 NOTE — Progress Notes (Addendum)
E Visit for ear infection  We are sorry that you are not feeling well. Here is how we plan to help!   Augmentin 625mg one tablet by mouth twice a day for 10 days  In certain cases ear infections may progress to a more serious bacterial infection of the middle or inner ear.  If you have a fever 102 and up and significantly worsening symptoms, this could indicate a more serious infection moving to the middle/inner and needs face to face evaluation in an office by a provider.  Your symptoms should improve over the next 3 days and should resolve in about 7 days.  HOME CARE:   Wash your hands frequently.  Do not place the tip of the bottle on your ear or touch it with your fingers.  You can take Acetominophen 650 mg every 4-6 hours as needed for pain.  If pain is severe or moderate, you can apply a heating pad (set on low) or hot water bottle (wrapped in a towel) to outer ear for 20 minutes.  This will also increase drainage.  Avoid ear plugs  Do not use Q-tips  After showers, help the water run out by tilting your head to one side.  GET HELP RIGHT AWAY IF:   Fever is over 102.2 degrees.  You develop progressive ear pain or hearing loss.  Ear symptoms persist longer than 3 days after treatment.  MAKE SURE YOU:   Understand these instructions.  Will watch your condition.  Will get help right away if you are not doing well or get worse.  TO PREVENT SWIMMER'S EAR:  Use a bathing cap or custom fitted swim molds to keep your ears dry.  Towel off after swimming to dry your ears.  Tilt your head or pull your earlobes to allow the water to escape your ear canal.  If there is still water in your ears, consider using a hairdryer on the lowest setting.  Thank you for choosing an e-visit. Your e-visit answers were reviewed by a board certified advanced clinical practitioner to complete your personal care plan. Depending upon the condition, your plan could have included both over  the counter or prescription medications. Please review your pharmacy choice. Be sure that the pharmacy you have chosen is open so that you can pick up your prescription now.  If there is a problem you may message your provider in MyChart to have the prescription routed to another pharmacy. Your safety is important to us. If you have drug allergies check your prescription carefully.  For the next 24 hours, you can use MyChart to ask questions about today's visit, request a non-urgent call back, or ask for a work or school excuse from your e-visit provider. You will get an email in the next two days asking about your experience. I hope that your e-visit has been valuable and will speed your recovery.   Greater than 5 but less than 10 minutes spent researching, coordinating, and implementing care for this patient today     

## 2019-08-26 ENCOUNTER — Other Ambulatory Visit: Payer: Self-pay

## 2019-08-27 ENCOUNTER — Ambulatory Visit (INDEPENDENT_AMBULATORY_CARE_PROVIDER_SITE_OTHER): Payer: BC Managed Care – PPO | Admitting: Family Medicine

## 2019-08-27 ENCOUNTER — Ambulatory Visit: Payer: BC Managed Care – PPO

## 2019-08-27 ENCOUNTER — Encounter: Payer: Self-pay | Admitting: Family Medicine

## 2019-08-27 ENCOUNTER — Ambulatory Visit: Payer: BC Managed Care – PPO | Attending: Internal Medicine

## 2019-08-27 VITALS — BP 90/70 | HR 69 | Temp 97.2°F | Ht 59.0 in | Wt 134.7 lb

## 2019-08-27 DIAGNOSIS — G4700 Insomnia, unspecified: Secondary | ICD-10-CM

## 2019-08-27 DIAGNOSIS — Z0000 Encounter for general adult medical examination without abnormal findings: Secondary | ICD-10-CM

## 2019-08-27 DIAGNOSIS — R5383 Other fatigue: Secondary | ICD-10-CM | POA: Diagnosis not present

## 2019-08-27 DIAGNOSIS — Z23 Encounter for immunization: Secondary | ICD-10-CM

## 2019-08-27 DIAGNOSIS — F321 Major depressive disorder, single episode, moderate: Secondary | ICD-10-CM

## 2019-08-27 DIAGNOSIS — O99810 Abnormal glucose complicating pregnancy: Secondary | ICD-10-CM

## 2019-08-27 DIAGNOSIS — Z13220 Encounter for screening for lipoid disorders: Secondary | ICD-10-CM | POA: Diagnosis not present

## 2019-08-27 LAB — CBC WITH DIFFERENTIAL/PLATELET
Basophils Absolute: 0 10*3/uL (ref 0.0–0.1)
Basophils Relative: 0.9 % (ref 0.0–3.0)
Eosinophils Absolute: 0.1 10*3/uL (ref 0.0–0.7)
Eosinophils Relative: 2.5 % (ref 0.0–5.0)
HCT: 39.1 % (ref 36.0–46.0)
Hemoglobin: 13.4 g/dL (ref 12.0–15.0)
Lymphocytes Relative: 42.8 % (ref 12.0–46.0)
Lymphs Abs: 1.9 10*3/uL (ref 0.7–4.0)
MCHC: 34.2 g/dL (ref 30.0–36.0)
MCV: 94.1 fl (ref 78.0–100.0)
Monocytes Absolute: 0.5 10*3/uL (ref 0.1–1.0)
Monocytes Relative: 10.3 % (ref 3.0–12.0)
Neutro Abs: 1.9 10*3/uL (ref 1.4–7.7)
Neutrophils Relative %: 43.5 % (ref 43.0–77.0)
Platelets: 282 10*3/uL (ref 150.0–400.0)
RBC: 4.15 Mil/uL (ref 3.87–5.11)
RDW: 12.2 % (ref 11.5–15.5)
WBC: 4.4 10*3/uL (ref 4.0–10.5)

## 2019-08-27 LAB — COMPREHENSIVE METABOLIC PANEL
ALT: 60 U/L — ABNORMAL HIGH (ref 0–35)
AST: 38 U/L — ABNORMAL HIGH (ref 0–37)
Albumin: 4.1 g/dL (ref 3.5–5.2)
Alkaline Phosphatase: 46 U/L (ref 39–117)
BUN: 9 mg/dL (ref 6–23)
CO2: 29 mEq/L (ref 19–32)
Calcium: 9 mg/dL (ref 8.4–10.5)
Chloride: 104 mEq/L (ref 96–112)
Creatinine, Ser: 0.68 mg/dL (ref 0.40–1.20)
GFR: 98.1 mL/min (ref >60.00)
Glucose, Bld: 101 mg/dL — ABNORMAL HIGH (ref 70–99)
Potassium: 5 mEq/L (ref 3.5–5.1)
Sodium: 138 mEq/L (ref 135–145)
Total Bilirubin: 0.4 mg/dL (ref 0.2–1.2)
Total Protein: 7.4 g/dL (ref 6.0–8.3)

## 2019-08-27 LAB — LIPID PANEL
Cholesterol: 166 mg/dL (ref 0–200)
HDL: 56.8 mg/dL (ref >39.00)
LDL Cholesterol: 90 mg/dL (ref 0–99)
NonHDL: 109.6
Total CHOL/HDL Ratio: 3
Triglycerides: 96 mg/dL (ref 0.0–149.0)
VLDL: 19.2 mg/dL (ref 0.0–40.0)

## 2019-08-27 LAB — VITAMIN D 25 HYDROXY (VIT D DEFICIENCY, FRACTURES): VITD: 23.32 ng/mL — ABNORMAL LOW (ref 30.00–100.00)

## 2019-08-27 LAB — TSH: TSH: 0.85 u[IU]/mL (ref 0.35–4.50)

## 2019-08-27 LAB — HEMOGLOBIN A1C: Hgb A1c MFr Bld: 5.6 % (ref 4.6–6.5)

## 2019-08-27 LAB — VITAMIN B12: Vitamin B-12: 807 pg/mL (ref 211–911)

## 2019-08-27 NOTE — Progress Notes (Signed)
   Covid-19 Vaccination Clinic  Name:  Kim Pacheco    MRN: [id number] DOB: 05-Apr-1986  08/27/2019  [name] was observed post Covid-19 immunization for 15 minutes without incident. She was provided with Vaccine Information Sheet and instruction to access the V-Safe system.   [name] was instructed to call 911 with any severe reactions post vaccine: Marland Kitchen Difficulty breathing  . Swelling of face and throat  . A fast heartbeat  . A bad rash all over body  . Dizziness and weakness   Immunizations Administered    Name Date Dose VIS Date Route   Pfizer COVID-19 Vaccine 08/27/2019  1:28 PM 0.3 mL 05/21/2019 Intramuscular   Manufacturer: [employer]   Lot: [id number]   NDC: [address]-8

## 2019-08-27 NOTE — Patient Instructions (Signed)

## 2019-08-27 NOTE — Progress Notes (Addendum)
Kim Pacheco DOB: 02-02-1986 Encounter date: 08/27/2019  This is a 34 y.o. female who presents for complete physical   History of present illness/Additional concerns: Things have been going well for her.   Depression: doing ok with this.  Last pap: [facility] 07/2014 normal; sees [facility] - thinks last pap was 1.5 years ago.   No longer following with Dr. For Hep B; was seeing Dr. Kinnie Scales; but switched to [facility] - last visit was pre-COVID.    Past Medical History:  Diagnosis Date  . Abdominal pain    started in mid abd and spread to left side/on and off/since Jan 2020/ pt states, she has fluid in abd/unknown  . Abnormal Pap smear    normal 06/2013  . Anxiety   . Constipation    hx of  . Depression    suicide attempt in college  . Diabetes mellitus without complication (HCC)    gestational, no issues since pregnancy per her report - resolved  . Gestational diabetes    /no meds now - resolved  . Hepatitis    Hep B, managed by Dr. Kinnie Scales  . History of chicken pox   . History of UTI   . Vaginal Pap smear, abnormal    Past Surgical History:  Procedure Laterality Date  . CESAREAN SECTION N/A 01/26/2013   Procedure: Primary Cesarean Section Delivery Baby  Boy @ 0017, Apgars 4/8/9;  Surgeon: Freddrick March. Tenny Craw, MD;  Location: [facility];  Service: Obstetrics;  Laterality: N/A;  . CESAREAN SECTION N/A 11/18/2017   Procedure: CESAREAN SECTION;  Surgeon: Olivia Mackie, MD;  Location: [facility] BIRTHING SUITES;  Service: Obstetrics;  Laterality: N/A;  . COLONOSCOPY  2020  . ESOPHAGOGASTRODUODENOSCOPY ENDOSCOPY    . laproscopy  2020  . NASAL SINUS SURGERY  1996  . WISDOM TOOTH EXTRACTION     Allergies  Allergen Reactions  . Contrast Media [Iodinated Diagnostic Agents] Shortness Of Breath and Rash    IV contrast    Current Meds  Medication Sig  . AMOXICILLIN PO Take by mouth.  Marland Kitchen buPROPion (WELLBUTRIN XL) 150 MG 24 hr tablet Take 1 tablet (150 mg total) by mouth daily.    Social History   Tobacco Use  . Smoking status: Former Smoker    Packs/day: 1.00    Types: Cigarettes    Quit date: 07/23/2011    Years since quitting: 8.1  . Smokeless tobacco: Never Used  . Tobacco comment: smoked for 8 years, quit in 2013  Substance Use Topics  . Alcohol use: Yes    Alcohol/week: 21.0 standard drinks    Types: 21 Glasses of wine per week   Family History  Adopted: Yes     Review of Systems  Constitutional: Negative for activity change, appetite change, chills, fatigue, fever and unexpected weight change.  HENT: Negative for congestion, ear pain, hearing loss, sinus pressure, sinus pain, sore throat and trouble swallowing.   Eyes: Negative for pain and visual disturbance.  Respiratory: Negative for cough, chest tightness, shortness of breath and wheezing.   Cardiovascular: Negative for chest pain, palpitations and leg swelling.  Gastrointestinal: Negative for abdominal pain, blood in stool, constipation, diarrhea, nausea and vomiting.  Genitourinary: Negative for difficulty urinating and menstrual problem.  Musculoskeletal: Negative for arthralgias and back pain.  Skin: Negative for rash.  Neurological: Positive for numbness (left arm; hand up to upper arm; no weakness; intermittent). Negative for dizziness, weakness and headaches.  Hematological: Negative for adenopathy. Does not bruise/bleed easily.  Psychiatric/Behavioral: Negative for sleep disturbance and suicidal ideas. The patient is not nervous/anxious.    Depression screen [facility] 2/9 08/27/2019 08/27/2019 04/08/2019 10/28/2018 10/28/2018  Decreased Interest 1 1 2 3 3   Down, Depressed, Hopeless 0 0 2 2 0  PHQ - 2 Score 1 1 4 5 3   Altered sleeping 2 2 1 2 2   Tired, decreased energy 2 2 1 2 1   Change in appetite 0 0 0 0 0  Feeling bad or failure about yourself  0 0 0 0 0  Trouble concentrating 2 2 1 1 2   Moving slowly or fidgety/restless 0 0 0 0 0  Suicidal thoughts 0 0 0 0 0  PHQ-9 Score 7 7 7 10 8    Difficult doing work/chores - - - Somewhat difficult -     CBC:  Lab Results  Component Value Date   WBC 7.6 07/08/2018   HGB 13.1 07/08/2018   HCT 38.8 07/08/2018   MCH 30.3 06/25/2018   MCHC 33.8 07/08/2018   RDW 13.1 07/08/2018   PLT 318.0 07/08/2018   CMP: Lab Results  Component Value Date   NA 139 07/13/2018   K 4.3 07/13/2018   CL 102 07/13/2018   CO2 28 07/13/2018   ANIONGAP 7 06/26/2018   GLUCOSE 93 07/13/2018   BUN 13 07/13/2018   CREATININE 0.74 07/13/2018   GFRAA >60 06/26/2018   CALCIUM 9.8 07/13/2018   PROT 7.8 07/08/2018   BILITOT 0.5 07/08/2018   ALKPHOS 54 07/08/2018   ALT 19 07/08/2018   AST 16 07/08/2018   LIPID: Lab Results  Component Value Date   CHOL 178 06/21/2014   TRIG 140.0 06/21/2014   HDL 46.30 06/21/2014   LDLCALC 104 (H) 06/21/2014    Objective:  BP 90/70 (BP Location: Left Arm, Patient Position: Sitting, Cuff Size: Normal)   Pulse 69   Temp (!) 97.2 F (36.2 C) (Temporal)   Ht 4\' 11"  (1.499 m)   Wt 134 lb 11.2 oz (61.1 kg)   LMP 08/25/2019 (Exact Date)   SpO2 99%   BMI 27.21 kg/m   Weight: 134 lb 11.2 oz (61.1 kg)   BP Readings from Last 3 Encounters:  08/27/19 90/70  01/06/19 122/71  07/29/18 104/60   Wt Readings from Last 3 Encounters:  08/27/19 134 lb 11.2 oz (61.1 kg)  01/06/19 130 lb (59 kg)  12/24/18 130 lb (59 kg)    Physical Exam Constitutional:      General: She is not in acute distress.    Appearance: She is well-developed.  HENT:     Head: Normocephalic and atraumatic.     Right Ear: External ear normal.     Left Ear: External ear normal.     Mouth/Throat:     Pharynx: No oropharyngeal exudate.  Eyes:     Conjunctiva/sclera: Conjunctivae normal.     Pupils: Pupils are equal, round, and reactive to light.  Neck:     Thyroid: No thyromegaly.  Cardiovascular:     Rate and Rhythm: Normal rate and regular rhythm.     Heart sounds: Normal heart sounds. No murmur. No friction rub. No gallop.    Pulmonary:     Effort: Pulmonary effort is normal.     Breath sounds: Normal breath sounds.  Abdominal:     General: Bowel sounds are normal. There is no distension.     Palpations: Abdomen is soft. There is no mass.     Tenderness: There is no abdominal tenderness. There is  no guarding.     Hernia: No hernia is present.  Musculoskeletal:        General: No tenderness or deformity. Normal range of motion.     Cervical back: Normal range of motion and neck supple.  Lymphadenopathy:     Cervical: No cervical adenopathy.  Skin:    General: Skin is warm and dry.     Findings: No rash.  Neurological:     Mental Status: She is alert and oriented to person, place, and time.     Deep Tendon Reflexes: Reflexes normal.     Reflex Scores:      Tricep reflexes are 2+ on the right side and 2+ on the left side.      Bicep reflexes are 2+ on the right side and 2+ on the left side.      Brachioradialis reflexes are 2+ on the right side and 2+ on the left side.      Patellar reflexes are 2+ on the right side and 2+ on the left side. Psychiatric:        Speech: Speech normal.        Behavior: Behavior normal.        Thought Content: Thought content normal.     Assessment/Plan: There are no preventive care reminders to display for this patient. Health Maintenance reviewed - up to date.  1. Preventative health care Continue to work on healthy eating.   2. Depression, major, single episode, moderate (HCC) Continue with wellbutrin.   3. Abnormal glucose tolerance test (GTT) during pregnancy, antepartum - Hemoglobin A1c; Future  4. Fatigue, unspecified type Starting with bloodwork - CBC with Differential/Platelet; Future - Comprehensive metabolic panel; Future - TSH; Future - Vitamin B12; Future - VITAMIN D 25 Hydroxy (Vit-D Deficiency, Fractures); Future  5. Lipid screening - Lipid panel; Future  6. Insomnia, unspecified type Would like to do sleep study. Discussed importance of  cutting back/out on alcohol to help with restoring sleep.   Return in about 6 months (around 02/27/2020) for Chronic condition visit.  Theodis Shove, MD

## 2019-08-27 NOTE — Addendum Note (Signed)
Addended by: Philemon Kingdom on: 08/27/2019 09:09 AM   Modules accepted: Orders

## 2019-09-03 ENCOUNTER — Encounter: Payer: Self-pay | Admitting: Family Medicine

## 2019-09-08 ENCOUNTER — Telehealth: Payer: BC Managed Care – PPO | Admitting: Family

## 2019-09-08 ENCOUNTER — Encounter: Payer: Self-pay | Admitting: Family Medicine

## 2019-09-08 DIAGNOSIS — W57XXXA Bitten or stung by nonvenomous insect and other nonvenomous arthropods, initial encounter: Secondary | ICD-10-CM

## 2019-09-08 DIAGNOSIS — R21 Rash and other nonspecific skin eruption: Secondary | ICD-10-CM | POA: Diagnosis not present

## 2019-09-08 MED ORDER — TRIAMCINOLONE ACETONIDE 0.5 % EX OINT: 1.0000 "application " | TOPICAL_OINTMENT | Freq: Two times a day (BID) | CUTANEOUS | 0 refills | Status: DC

## 2019-09-08 NOTE — Progress Notes (Signed)
E Visit for Insect Sting  Thank you for describing the insect sting for [place].  Here is how we plan to help!  Based on the information you have shared with me it looks like you have: some type of insect bug. Please check your mattress for bugs.   The 2 greatest risks from insect stings are allergic reaction, which can be fatal in some people and infection, which is more common and less serious.  Bees, wasps, yellow jackets, and hornets belong to a class of insects called Hymenoptera.  Most insect stings cause only minor discomfort.  Stings can happen anywhere on the body and can be painful.  Most stings are from honey bees or yellow jackets.  Fire ants can sting multiple times.  The sites of the stings are more likely to become infected.    Based on your information I have: and Provided a home care guide for insect stings and instructions on when to call for help. I have also sent in a prescription of kenalog cream that you will apply twice a day.   What can be used to prevent Insect Stings?   Insect repellant with at least 20% DEET.    Wearing long pants and shirts with socks and shoes.    Wear dark or drab-colored clothes rather than bright colors.    Avoid using perfumes and hair sprays; these attract insects.  HOME CARE ADVICE:  1. Stinger removal:  The stinger looks like a tiny black dot in the sting.  Use a fingernail, credit card edge, or knife-edge to scrape it off.  Don't pull it out because it squeezes out more venom.  If the stinger is below the skin surface, leave it alone.  It will be shed with normal skin healing. 2. Use cold compresses to the area of the sting for 10-20 minutes.  You may repeat this as needed to relieve symptoms of pain and swelling. 3.  For pain relief, take acetominophen 650 mg 4-6 hours as needed or ibuprofen 400 mg every 6-8 hours as needed or naproxen 250-500 mg every 12 hours as needed. 4.  You can also use hydrocortisone cream 0.5% or 1% up  to 4 times daily as needed for itching. 5.  If the sting becomes very itchy, take Benadryl 25-50 mg, follow directions on box. 6.  Wash the area 2-3 times daily with antibacterial soap and warm water. 7. Call your Doctor if:  Fever, a severe headache, or rash occur in the next 2 weeks.  Sting area begins to look infected.  Redness and swelling worsens after home treatment.  Your current symptoms become worse.    MAKE SURE YOU:   Understand these instructions.  Will watch your condition.  Will get help right away if you are not doing well or get worse.  Thank you for choosing an e-visit. Your e-visit answers were reviewed by a board certified advanced clinical practitioner to complete your personal care plan. Depending upon the condition, your plan could have included both over the counter or prescription medications. Please review your pharmacy choice. Be sure that the pharmacy you have chosen is open so that you can pick up your prescription now.  If there is a problem you may message your provider in MyChart to have the prescription routed to another pharmacy. Your safety is important to [place]. If you have drug allergies check your prescription carefully.  For the next 24 hours, you can use MyChart to ask questions about today's visit,  request a non-urgent call back, or ask for a work or school excuse from your e-visit provider. You will get an email in the next two days asking about your experience. I hope that your e-visit has been valuable and will speed your recovery.  Approximately 5 minutes was spent documenting and reviewing patient's chart.

## 2019-09-21 ENCOUNTER — Ambulatory Visit: Payer: BC Managed Care – PPO | Attending: Internal Medicine

## 2019-09-21 DIAGNOSIS — Z23 Encounter for immunization: Secondary | ICD-10-CM

## 2019-09-21 NOTE — Progress Notes (Signed)
   Covid-19 Vaccination Clinic  Name:  MASAKO OVERALL    MRN: [id number] DOB: 08-Feb-1986  09/21/2019  [name] was observed post Covid-19 immunization for 15 minutes without incident. She was provided with Vaccine Information Sheet and instruction to access the V-Safe system.   Ms. Didonato was instructed to call 911 with any severe reactions post vaccine: Marland Kitchen Difficulty breathing  . Swelling of face and throat  . A fast heartbeat  . A bad rash all over body  . Dizziness and weakness   Immunizations Administered    Name Date Dose VIS Date Route   Pfizer COVID-19 Vaccine 09/21/2019 12:42 PM 0.3 mL 05/21/2019 Intramuscular   Manufacturer: [employer], [employer]   Lot: [id number]   NDC: [address]-7

## 2019-11-25 ENCOUNTER — Encounter: Payer: Self-pay | Admitting: Family Medicine

## 2019-11-26 ENCOUNTER — Other Ambulatory Visit: Payer: Self-pay

## 2019-11-29 ENCOUNTER — Other Ambulatory Visit: Payer: Self-pay

## 2019-11-29 ENCOUNTER — Encounter: Payer: Self-pay | Admitting: Family Medicine

## 2019-11-29 ENCOUNTER — Ambulatory Visit (INDEPENDENT_AMBULATORY_CARE_PROVIDER_SITE_OTHER): Payer: BC Managed Care – PPO | Admitting: Family Medicine

## 2019-11-29 VITALS — BP 98/70 | HR 65 | Temp 97.4°F

## 2019-11-29 DIAGNOSIS — E663 Overweight: Secondary | ICD-10-CM

## 2019-11-29 DIAGNOSIS — L02219 Cutaneous abscess of trunk, unspecified: Secondary | ICD-10-CM | POA: Diagnosis not present

## 2019-11-29 MED ORDER — AMOXICILLIN-POT CLAVULANATE 875-125 MG PO TABS: 1.0000 | ORAL_TABLET | Freq: Two times a day (BID) | ORAL | 0 refills | Status: AC

## 2019-11-29 NOTE — Progress Notes (Signed)
  Kim Pacheco DOB: 21-Aug-1985 Encounter date: 11/29/2019  This is a 34 y.o. female who presents with Chief Complaint  Patient presents with  . Rash    patient complains of ?bump pubic area x month, noticed blood and pus on the area this weekend    History of present illness: Spot has been there for about a month. Thought it was ingrown hair, but then continued to get bigger and then to point where it would hurt when she would sit/shift/move. Sat night it burst open. Put peroxide on it; has gone down but bump still on it. Little less tender now that it drained. Not still draining.   No fevers or chills. No hx of skin infection/boils.   She stopped all medication and started working out at the gym to stay in shape, lose weight. Doing 1600 calorie diet, working with trainer. Mood is doing a lot better with this. She has been doing this for a month. She feels stronger; some of friends have noted difference.    Allergies  Allergen Reactions  . Contrast Media [Iodinated Diagnostic Agents] Shortness Of Breath and Rash    IV contrast    No outpatient medications have been marked as taking for the 11/29/19 encounter (Office Visit) with Wynn Banker, MD.    Review of Systems  Constitutional: Negative for chills, fatigue and fever.  Respiratory: Negative for cough, chest tightness, shortness of breath and wheezing.   Cardiovascular: Negative for chest pain, palpitations and leg swelling.  Skin:       See hpi   Psychiatric/Behavioral: The patient is not nervous/anxious (feels better with regular exercise).     Objective:  BP 98/70 (BP Location: Left Arm, Patient Position: Sitting, Cuff Size: Normal)   Pulse 65   Temp (!) 97.4 F (36.3 C) (Temporal)   LMP 11/15/2019 (Approximate)       BP Readings from Last 3 Encounters:  11/29/19 98/70  08/27/19 90/70  01/06/19 122/71   Wt Readings from Last 3 Encounters:  08/27/19 134 lb 11.2 oz (61.1 kg)  01/06/19 130 lb (59  kg)  12/24/18 130 lb (59 kg)    Physical Exam Constitutional:      Appearance: Normal appearance.  Abdominal:       Comments: 0.40mm papule with central scab suprapubic area. No active drainage. No underlying fluctuance. It is tender to touch. No noted surrounding erythema.  Neurological:     Mental Status: She is alert.     Assessment/Plan  1. Abscess, suprapubic Warm compresses as often as possible. Nothing there to drain today as it drained over weekend. Antibiotic as directed. Let me know if not improved by end of week. Should be healed in 2 weeks. No shaving/picking/popping. Return to office if any worsening.  2. Overweight Keep up with calorie counting and with trainer. I would prefer not to use medications for weight loss and we discussed risks of these. If not having success we can have repeat discussion about options for weight loss medications. She is motivated to keep exercising and keep monitoring caloric intake.   Return for if infection not resolved in 2 weeks or with any worsening.    Theodis Shove, MD

## 2019-11-29 NOTE — Patient Instructions (Addendum)
High intensity interval trainer with low weights.   Track those calories for another 1-2 months. If you are not able to lose in that time then let's do a follow up discussion. Healthy weight loss is 1-2 lb/week.   Warm compresses as much as possible.

## 2019-12-15 ENCOUNTER — Other Ambulatory Visit: Payer: BC Managed Care – PPO

## 2019-12-15 ENCOUNTER — Encounter: Payer: Self-pay | Admitting: Family Medicine

## 2019-12-15 DIAGNOSIS — R300 Dysuria: Secondary | ICD-10-CM

## 2019-12-15 DIAGNOSIS — O26893 Other specified pregnancy related conditions, third trimester: Secondary | ICD-10-CM

## 2019-12-15 NOTE — Addendum Note (Signed)
Addended by: Johnella Moloney on: 12/15/2019 11:39 AM   Modules accepted: Orders

## 2019-12-15 NOTE — Telephone Encounter (Signed)
Would suggest she drop off urine (NOT POC because they don't do those at [facility]) at [facility] for urinalysis AND culture and then set up virtual visit for her tomorrow with Dr. Selena Batten to review. If she is willing to do that please order labs to [facility].

## 2019-12-15 NOTE — Telephone Encounter (Signed)
Spoke with the pt and informed her of the message below.  Patient agreed, appt scheduled for video visit with Dr Selena Batten on 7/8 and pt was advised to go to the [facility] lab for urine testing today.

## 2019-12-16 ENCOUNTER — Encounter: Payer: Self-pay | Admitting: Family Medicine

## 2019-12-16 ENCOUNTER — Telehealth (INDEPENDENT_AMBULATORY_CARE_PROVIDER_SITE_OTHER): Payer: BC Managed Care – PPO | Admitting: Family Medicine

## 2019-12-16 ENCOUNTER — Telehealth: Payer: Self-pay | Admitting: *Deleted

## 2019-12-16 DIAGNOSIS — R300 Dysuria: Secondary | ICD-10-CM

## 2019-12-16 LAB — URINE CULTURE
MICRO NUMBER:: 10675551
Result:: NO GROWTH
SPECIMEN QUALITY:: ADEQUATE

## 2019-12-16 MED ORDER — CEPHALEXIN 500 MG PO CAPS: 500.0000 mg | ORAL_CAPSULE | Freq: Two times a day (BID) | ORAL | 0 refills | Status: DC

## 2019-12-16 NOTE — Progress Notes (Signed)
Virtual Visit via Video Note  I connected with Kim Pacheco  on 12/16/19 at 10:00 AM EDT by a video enabled telemedicine application and verified that I am speaking with the correct person using two identifiers.  Location patient: home Location provider:work or home office Persons participating in the virtual visit: patient, provider  I discussed the limitations of evaluation and management by telemedicine and the availability of in person appointments. The patient expressed understanding and agreed to proceed.   HPI:  Acute Dysuria: -started 3 days ago -symptoms included burning with urination, full feeling with urination, urgency -has hx of recurrent UTI, she had keflex on hand a prior doctor gave her to take after sex, she started the keflex 500mg  bid and took for 2 days and symptoms have improved (but abx was expired) -no symptoms today, but wants to take a full course of abx if possible -she submitted urine to pcp office - I can see orders and message from PCP but now urine results -denies any abd or flank pain, fevers, malaise, hematuria, NV -periods monthly per nursing notes, FDLMP about 3-4 weeks ago  ROS: See pertinent positives and negatives per HPI.  Past Medical History:  Diagnosis Date  . Abdominal pain    started in mid abd and spread to left side/on and off/since Jan 2020/ pt states, she has fluid in abd/unknown  . Abnormal Pap smear    normal 06/2013  . Anxiety   . Constipation    hx of  . Depression    suicide attempt in college  . Diabetes mellitus without complication (HCC)    gestational, no issues since pregnancy per her report - resolved  . Gestational diabetes    /no meds now - resolved  . Hepatitis    Hep B, managed by Dr. 07/2013  . History of chicken pox   . History of UTI   . Vaginal Pap smear, abnormal     Past Surgical History:  Procedure Laterality Date  . CESAREAN SECTION N/A 01/26/2013   Procedure: Primary Cesarean Section Delivery Baby  Boy @  0017, Apgars 4/8/9;  Surgeon: 07-17-2006. Freddrick March, MD;  Location: [facility];  Service: Obstetrics;  Laterality: N/A;  . CESAREAN SECTION N/A 11/18/2017   Procedure: CESAREAN SECTION;  Surgeon: 01/18/2018, MD;  Location: [facility] BIRTHING SUITES;  Service: Obstetrics;  Laterality: N/A;  . COLONOSCOPY  2020  . ESOPHAGOGASTRODUODENOSCOPY ENDOSCOPY    . laproscopy  2020  . NASAL SINUS SURGERY  1996  . WISDOM TOOTH EXTRACTION      Family History  Adopted: Yes    SOCIAL HX: see hpi   Current Outpatient Medications:  .  cephALEXin (KEFLEX) 500 MG capsule, Take 1 capsule (500 mg total) by mouth 2 (two) times daily., Disp: 10 capsule, Rfl: 0  EXAM:  VITALS per patient if applicable:  GENERAL: alert, oriented, appears well and in no acute distress  HEENT: atraumatic, conjunttiva clear, no obvious abnormalities on inspection of external nose and ears  NECK: normal movements of the head and neck  LUNGS: on inspection no signs of respiratory distress, breathing rate appears normal, no obvious gross SOB, gasping or wheezing  CV: no obvious cyanosis  MS: moves all visible extremities without noticeable abnormality  PSYCH/NEURO: pleasant and cooperative, no obvious depression or anxiety, speech and thought processing grossly intact  ASSESSMENT AND PLAN:  Discussed the following assessment and plan:  Dysuria  -we discussed possible serious and likely etiologies, options for evaluation and workup, limitations of telemedicine  visit vs in person visit, treatment, treatment risks and precautions. Pt prefers to treat via telemedicine empirically rather then risking or undertaking an in person visit at this moment. She opted for empiric keflex 500bid x 5 days pending urine results per prior experience and inproving on keflex. Rx sent. Sent message to PCP office to check on urine results from there as I do not see UA results in today. Patient agrees to seek prompt in person care if worsening, new symptoms  arise, or if symptoms do not resolve with treatment.   I discussed the assessment and treatment plan with the patient. The patient was provided an opportunity to ask questions and all were answered. The patient agreed with the plan and demonstrated an understanding of the instructions.   The patient was advised to call back or seek an in-person evaluation if the symptoms worsen or if the condition fails to improve as anticipated.   Kim Koyanagi, DO

## 2019-12-16 NOTE — Telephone Encounter (Signed)
Dr Selena Batten requested results of the UA performed on 7/7.  I called the lab at [facility] and per [facility] the test was sent to [facility].  I called [facility] and per Gala Murdoch the urinalysis and urine culture are pending.  Dr Selena Batten was informed of this and stated to inform the pt she will be contacted with the results once received.  Spoke with the pt and informed her of above and advised if the results are not received today, Dr Hassan Rowan will review tomorrow.

## 2019-12-17 LAB — URINALYSIS

## 2020-01-17 DIAGNOSIS — M546 Pain in thoracic spine: Secondary | ICD-10-CM | POA: Diagnosis not present

## 2020-01-31 ENCOUNTER — Encounter: Payer: Self-pay | Admitting: Family Medicine

## 2020-01-31 NOTE — Telephone Encounter (Signed)
Spoke with the pt and scheduled a virtual visit for tomorrow at 10:40am as there are no other openings available today.

## 2020-02-01 ENCOUNTER — Encounter: Payer: Self-pay | Admitting: Family Medicine

## 2020-02-01 ENCOUNTER — Telehealth (INDEPENDENT_AMBULATORY_CARE_PROVIDER_SITE_OTHER): Payer: BC Managed Care – PPO | Admitting: Family Medicine

## 2020-02-01 DIAGNOSIS — R42 Dizziness and giddiness: Secondary | ICD-10-CM

## 2020-02-01 DIAGNOSIS — R110 Nausea: Secondary | ICD-10-CM

## 2020-02-01 DIAGNOSIS — R519 Headache, unspecified: Secondary | ICD-10-CM

## 2020-02-01 NOTE — Patient Instructions (Signed)
-  can use aleve or topical menthol for the headache if needed.Use per insturctions.  -do not use the goggle anymore  [phone] testing  -follow up with your primary care doctor if any recurrent dizziness, worsening or your symptoms are not resolving over the next few days.  -I hope you are feeling better soon!

## 2020-02-01 NOTE — Progress Notes (Signed)
Virtual Visit via Video Note  I connected with Kim Pacheco  on 02/01/20 at 10:40 AM EDT by a video enabled telemedicine application and verified that I am speaking with the correct person using two identifiers.  Location patient: home, [place] Location provider:work or home office Persons participating in the virtual visit: patient, provider  I discussed the limitations of evaluation and management by telemedicine and the availability of in person appointments. The patient expressed understanding and agreed to proceed.   HPI:  Acute visit for HA and nausea: -started after playing a game that had special goggles where everything is upside down and you have to do activities with the goggles on - for 15 minutes -immediately started feeling nauseous and dizzy and got a mild headache -still feeling like this 2 days later - has improved some today, no nausea and no dizziness, but still has a mild bifrontal headache -denies severe headache, worst headache, vomiting, diarrhea, sinus congestion, cough, SOB, vision changes -daughter was sick with RSV last week -she is working and kids are in school - fully vaccinated for COVID19   ROS: See pertinent positives and negatives per HPI.  Past Medical History:  Diagnosis Date   Abdominal pain    started in mid abd and spread to left side/on and off/since Jan 2020/ pt states, she has fluid in abd/unknown   Abnormal Pap smear    normal 06/2013   Anxiety    Constipation    hx of   Depression    suicide attempt in college   Diabetes mellitus without complication (HCC)    gestational, no issues since pregnancy per her report - resolved   Gestational diabetes    /no meds now - resolved   Hepatitis    Hep B, managed by Dr. Kinnie Scales   History of chicken pox    History of UTI    Vaginal Pap smear, abnormal     Past Surgical History:  Procedure Laterality Date   CESAREAN SECTION N/A 01/26/2013   Procedure: Primary Cesarean Section Delivery Baby   Boy @ 0017, Apgars 4/8/9;  Surgeon: Freddrick March. Tenny Craw, MD;  Location: [facility];  Service: Obstetrics;  Laterality: N/A;   CESAREAN SECTION N/A 11/18/2017   Procedure: CESAREAN SECTION;  Surgeon: Olivia Mackie, MD;  Location: [facility] BIRTHING SUIT[facility];  Service: Obstetrics;  Laterality: N/A;   COLONOSCOPY  2020   ESOPHAGOGASTRODUODENOSCOPY ENDOSCOPY     laproscopy  2020   NASAL SINUS SURGERY  1996   WISDOM TOOTH EXTRACTION      Family History  Adopted: Yes    SOCIAL HX: see hpi  No current outpatient medications on file.  EXAM:  VITALS per patient if applicable:  GENERAL: alert, oriented, appears well and in no acute distress  HEENT: atraumatic, conjunttiva clear, no obvious abnormalities on inspection of external nose and ears  NECK: normal movements of the head and neck  LUNGS: on inspection no signs of respiratory distress, breathing rate appears normal, no obvious gross SOB, gasping or wheezing  CV: no obvious cyanosis  MS: moves all visible extremities without noticeable abnormality  PSYCH/NEURO: pleasant and cooperative, no obvious depression or anxiety, speech and thought processing grossly intact  ASSESSMENT AND PLAN:  Discussed the following assessment and plan:  Nonintractable headache, unspecified chronicity pattern, unspecified headache type  Dizziness  Nausea  -we discussed possible serious and likely etiologies, options for evaluation and workup, limitations of telemedicine visit vs in person visit, treatment, treatment risks and precautions. Pt prefers to treat via  telemedicine empirically rather then risking or undertaking an in person visit at this moment. Sounds like she had some vertigo from the unusual game she was playing and may have had a headache from this as well. Also discussed other causes of these symptoms at length. She agrees to do a covid test and since doing better monitor closely for now with analgesics or topical menthol prn for pain and  re-eval advised if any recurring dizziness or other symptoms or if headache not resolved over the next 1-2 days. Offered to schedule follow up, but she prefers to call PCP office instead if not doing better.  Advised to seek prompt follow up telemedicine visit or in person care if worsening, new symptoms arise, or if is not improving with treatment.  I discussed the assessment and treatment plan with the patient. The patient was provided an opportunity to ask questions and all were answered. The patient agreed with the plan and demonstrated an understanding of the instructions.   The patient was advised to call back or seek an in-person evaluation if the symptoms worsen or if the condition fails to improve as anticipated.   Kim Koyanagi, DO

## 2020-02-02 ENCOUNTER — Encounter: Payer: Self-pay | Admitting: Pulmonary Disease

## 2020-02-02 ENCOUNTER — Other Ambulatory Visit: Payer: Self-pay

## 2020-02-02 ENCOUNTER — Ambulatory Visit (INDEPENDENT_AMBULATORY_CARE_PROVIDER_SITE_OTHER): Payer: BC Managed Care – PPO | Admitting: Pulmonary Disease

## 2020-02-02 VITALS — BP 110/60 | HR 78 | Temp 98.1°F | Ht 59.0 in | Wt 133.6 lb

## 2020-02-02 DIAGNOSIS — G4710 Hypersomnia, unspecified: Secondary | ICD-10-CM | POA: Diagnosis not present

## 2020-02-02 DIAGNOSIS — F1010 Alcohol abuse, uncomplicated: Secondary | ICD-10-CM

## 2020-02-02 DIAGNOSIS — G47419 Narcolepsy without cataplexy: Secondary | ICD-10-CM | POA: Insufficient documentation

## 2020-02-02 NOTE — Assessment & Plan Note (Signed)
Although she scores low on Epworth sleepiness score, I suspect she is underreporting. She appears to be a long sleeper and sleep pattern has been going on since elementary school. She does not have any red flags for narcolepsy or obstructive sleep apnea.  Does not give any history suggestive of restless leg syndrome. Suspect that she has idiopathic hypersomnolence with a long sleeper pattern which is now been disrupted due to having kids and having to wake up on school days. Her alcohol intake does raise the left flank but she denies any episodes of withdrawal Depression appears well controlled  We will proceed with nocturnal polysomnogram to rule out obstructive sleep disorder and PLM D and follow-up with MSLT to rule out sleep onset REM sleep/narcolepsy

## 2020-02-02 NOTE — Patient Instructions (Signed)
  Keep sleep diary for 2 to 4 weeks. Schedule nocturnal polysomnogram followed by MSLT/nap test

## 2020-02-02 NOTE — Progress Notes (Signed)
Subjective:    Patient ID: Kim Pacheco, female    DOB: 09-07-1985, 34 y.o.   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  She reports that ever since elementary school she has loud sleeping and will sleep 8 to 11 hours if she could. She never preferred early morning classes for morning work.  She now has 2 kids aged 2 and 7 and has to wake up around 6 AM on school days.  This leaves her feeling tired and sleepy during the day but she is unable to take naps. On weekends she will catch up by sleeping 12 to 13 hours and still feel tired.  Epworth sleepiness score is 3 but I suspect that she is underreporting. Bedtime is between 9 and 11 PM, sleep latency is 1 to 2 hours, she often is working with her phone around bedtime, she sleeps on her stomach with 1 pillow, reports 1-2 nocturnal awakenings, denies nocturia and is out of bed at 6 AM on school days and on weekends will stay in bed until 10 AM feeling tired with occasional dryness of mouth. Mild snoring has been noted by her husband, no leg movements or teeth grinding is been described, occasional talking in her sleep. Weight is unchanged. She drinks 4 glasses of wine starting at dinner She reports 1 large cup of coffee and denies soda or other energy drinks  There is no history suggestive of cataplexy, sleep paralysis or parasomnias    Past Medical History:  Diagnosis Date   Abdominal pain    started in mid abd and spread to left side/on and off/since Jan 2020/ pt states, she has fluid in abd/unknown   Abnormal Pap smear    normal 06/2013   Anxiety    Constipation    hx of   Depression    suicide attempt in college   Diabetes mellitus without complication (HCC)    gestational, no issues since pregnancy per her report - resolved   Gestational diabetes    /no meds now - resolved   Hepatitis    Hep B, managed by  Dr. Kinnie Scales   History of chicken pox    History of UTI    Vaginal Pap smear, abnormal     Past Surgical History:  Procedure Laterality Date   CESAREAN SECTION N/A 01/26/2013   Procedure: Primary Cesarean Section Delivery Baby  Boy @ 0017, Apgars 4/8/9;  Surgeon: Freddrick March. Tenny Craw, MD;  Location: [facility];  Service: Obstetrics;  Laterality: N/A;   CESAREAN SECTION N/A 11/18/2017   Procedure: CESAREAN SECTION;  Surgeon: Olivia Mackie, MD;  Location: [facility]IRTHING SUIT[facility];  Service: Obstetrics;  Laterality: N/A;   COLONOSCOPY  2020   ESOPHAGOGASTRODUODENOSCOPY ENDOSCOPY     laproscopy  2020   NASAL SINUS SURGERY  1996   WISDOM TOOTH EXTRACTION      Allergies  Allergen Reactions   Contrast Media [Iodinated Diagnostic Agents] Shortness Of Breath and Rash    IV contrast     Social History   Socioeconomic History   Marital status: Married    Spouse name: Not on file   Number of children: 1   Years of education: college   Highest education level: Not on file  Occupational History   Occupation: Environmental manager  Tobacco Use   Smoking status: Former Smoker    Packs/day: 1.00    Types: Cigarettes    Quit date: 07/23/2011  Years since quitting: 8.5   Smokeless tobacco: Never Used   Tobacco comment: smoked for 8 years, quit in 2013  Vaping Use   Vaping Use: Never used  Substance and Sexual Activity   Alcohol use: Yes    Alcohol/week: 21.0 standard drinks    Types: 21 Glasses of wine per week   Drug use: Yes    Types: Marijuana    Comment: Last use 12/19/18   Sexual activity: Yes    Birth control/protection: Pill  Other Topics Concern   Not on file  Social History Narrative   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      Home Situation: lives with husband, son born in 01/2013      Spiritual Beliefs: none      Lifestyle: no regular exercise; diet is fair - [place] diet   Social Determinants of Health   Financial Resource Strain:    Difficulty of  Paying Living Expenses: Not on file  Food Insecurity:    Worried About Programme researcher, broadcasting/film/video in the Last Year: Not on file   [employer] of Food in the Last Year: Not on file  Transportation Needs:    Lack of Transportation (Medical): Not on file   Lack of Transportation (Non-Medical): Not on file  Physical Activity:    Days of Exercise per Week: Not on file   Minutes of Exercise per Session: Not on file  Stress:    Feeling of Stress : Not on file  Social Connections:    Frequency of Communication with Friends and Family: Not on file   Frequency of Social Gatherings with Friends and Family: Not on file   Attends Religious Services: Not on file   Active Member of Clubs or Organizations: Not on file   Attends Banker Meetings: Not on file   Marital Status: Not on file  Intimate Partner Violence:    Fear of Current or Ex-Partner: Not on file   Emotionally Abused: Not on file   Physically Abused: Not on file   Sexually Abused: Not on file     Family history -adopted Review of Systems Occasional headaches Earaches Anxiety depression Joint stiffness Abdominal pain    Objective:   Physical Exam  Gen. Pleasant, well-nourished, in no distress, normal affect ENT - no pallor,icterus, no post nasal drip Neck: No JVD, no thyromegaly, no carotid bruits Lungs: no use of accessory muscles, no dullness to percussion, clear without rales or rhonchi  Cardiovascular: Rhythm regular, heart sounds  normal, no murmurs or gallops, no peripheral edema Abdomen: soft and non-tender, no hepatosplenomegaly, BS normal. Musculoskeletal: No deformities, no cyanosis or clubbing Neuro:  alert, non focal       Assessment & Plan:

## 2020-02-02 NOTE — Assessment & Plan Note (Signed)
Effect of alcohol on sleep was discussed. She was encouraged to cut down

## 2020-02-07 DIAGNOSIS — Z20822 Contact with and (suspected) exposure to covid-19: Secondary | ICD-10-CM | POA: Diagnosis not present

## 2020-02-21 DIAGNOSIS — M546 Pain in thoracic spine: Secondary | ICD-10-CM | POA: Diagnosis not present

## 2020-02-27 ENCOUNTER — Encounter: Payer: Self-pay | Admitting: Family Medicine

## 2020-02-28 DIAGNOSIS — B349 Viral infection, unspecified: Secondary | ICD-10-CM | POA: Diagnosis not present

## 2020-02-28 DIAGNOSIS — Z20822 Contact with and (suspected) exposure to covid-19: Secondary | ICD-10-CM | POA: Diagnosis not present

## 2020-02-29 NOTE — Telephone Encounter (Signed)
Spoke with the pt and she declined a visit for today.  Virtual appt scheduled with Dr Selena Batten on 9/23 per pts preference.

## 2020-02-29 NOTE — Telephone Encounter (Signed)
I think that this could be addressed with virtual visit to determine whether referral is needed or something we can address. Please see if [facility] has any openings.

## 2020-03-02 ENCOUNTER — Telehealth (INDEPENDENT_AMBULATORY_CARE_PROVIDER_SITE_OTHER): Payer: BC Managed Care – PPO | Admitting: Family Medicine

## 2020-03-02 ENCOUNTER — Encounter: Payer: Self-pay | Admitting: Family Medicine

## 2020-03-02 DIAGNOSIS — M25571 Pain in right ankle and joints of right foot: Secondary | ICD-10-CM

## 2020-03-02 DIAGNOSIS — M546 Pain in thoracic spine: Secondary | ICD-10-CM | POA: Diagnosis not present

## 2020-03-02 NOTE — Patient Instructions (Signed)
-  We placed a referral for you as discussed. It usually takes about 1-2 weeks to process and schedule this referral. If you have not heard from [place] regarding this appointment in 5-7 days please contact our office.   -can try ice, topical menthol such as tiger balm or aleve if needed for pain in the interim.

## 2020-03-02 NOTE — Progress Notes (Signed)
Virtual Visit via Video Note  I connected with Kim Pacheco  on 03/02/20 at  1:00 PM EDT by a video enabled telemedicine application and verified that I am speaking with the correct person using two identifiers.  Location patient: home, [place] Location provider:work or home office Persons participating in the virtual visit: patient, provider  I discussed the limitations of evaluation and management by telemedicine and the availability of in person appointments. The patient expressed understanding and agreed to proceed.   HPI:  Acute telemedicine visit for R ankle: -Onset: a "couple months" -Symptoms include: pain in the achilles tendon area - TTP -Denies: redness, swelling, weakness, numbness known trauma - but she thinks she may have hit the back of her ankle at on something at one point -Has tried: chiropractic care - reports the chiroprator has been "pulling it until pops" but has not improved   ROS: See pertinent positives and negatives per HPI.  Past Medical History:  Diagnosis Date  . Abdominal pain    started in mid abd and spread to left side/on and off/since Jan 2020/ pt states, she has fluid in abd/unknown  . Abnormal Pap smear    normal 06/2013  . Anxiety   . Constipation    hx of  . Depression    suicide attempt in college  . Diabetes mellitus without complication (HCC)    gestational, no issues since pregnancy per her report - resolved  . Gestational diabetes    /no meds now - resolved  . Hepatitis    Hep B, managed by Dr. Kinnie Scales  . History of chicken pox   . History of UTI   . Vaginal Pap smear, abnormal     Past Surgical History:  Procedure Laterality Date  . CESAREAN SECTION N/A 01/26/2013   Procedure: Primary Cesarean Section Delivery Baby  Boy @ 0017, Apgars 4/8/9;  Surgeon: Freddrick March. Tenny Craw, MD;  Location: [facility];  Service: Obstetrics;  Laterality: N/A;  . CESAREAN SECTION N/A 11/18/2017   Procedure: CESAREAN SECTION;  Surgeon: Olivia Mackie, MD;  Location: [facility]  BIRTHING SUIT[facility];  Service: Obstetrics;  Laterality: N/A;  . COLONOSCOPY  2020  . ESOPHAGOGASTRODUODENOSCOPY ENDOSCOPY    . laproscopy  2020  . NASAL SINUS SURGERY  1996  . WISDOM TOOTH EXTRACTION      No current outpatient medications on file.  EXAM:  VITALS per patient if applicable:  GENERAL: alert, oriented, appears well and in no acute distress  HEENT: atraumatic, conjunttiva clear, no obvious abnormalities on inspection of external nose and ears  NECK: normal movements of the head and neck  LUNGS: on inspection no signs of respiratory distress, breathing rate appears normal, no obvious gross SOB, gasping or wheezing  CV: no obvious cyanosis  MS: moves all visible extremities without noticeable abnormality, able to move ankle normally per her report, points to achilles tendon near distal attachment as area of concern - admits to TTP here on her own exam over the video, I do not appreciate swelling or redness  PSYCH/NEURO: pleasant and cooperative, no obvious depression or anxiety, speech and thought processing grossly intact  ASSESSMENT AND PLAN:  Discussed the following assessment and plan:  Right ankle pain, unspecified chronicity - Plan: Ambulatory referral to Sports Medicine  -we discussed possible serious and likely etiologies, options for evaluation and workup, limitations of telemedicine visit vs in person visit, treatment, treatment risks and precautions. Pt prefers to treat via telemedicine empirically rather than in person at this moment. Query tendinitis vs  other. She opted for referral to sports medicine for further evaluation and management.Sent referral through her PCP office. Advised to contact her PCP if does not here back regarding this in the next 5-7 days. Discussed analgesic options she could use in the interim (summary in patient instructions) if needed. Advised to seek prompt follow up telemedicine visit or in person care if worsening, new symptoms arise,  or if is not improving with treatment. Did let this patient know that I only do telemedicine on Tuesdays and Thursdays for [place]. Advised to schedule follow up visit with PCP or [facility] if any further questions or concerns to avoid delays in care.   I discussed the assessment and treatment plan with the patient. The patient was provided an opportunity to ask questions and all were answered. The patient agreed with the plan and demonstrated an understanding of the instructions.     [name], DO

## 2020-03-10 ENCOUNTER — Other Ambulatory Visit: Payer: Self-pay

## 2020-03-10 ENCOUNTER — Ambulatory Visit (INDEPENDENT_AMBULATORY_CARE_PROVIDER_SITE_OTHER): Payer: BC Managed Care – PPO | Admitting: Family Medicine

## 2020-03-10 ENCOUNTER — Ambulatory Visit: Payer: Self-pay

## 2020-03-10 ENCOUNTER — Encounter: Payer: Self-pay | Admitting: Family Medicine

## 2020-03-10 ENCOUNTER — Ambulatory Visit (INDEPENDENT_AMBULATORY_CARE_PROVIDER_SITE_OTHER): Payer: BC Managed Care – PPO

## 2020-03-10 VITALS — BP 100/70 | HR 65 | Ht 59.0 in | Wt 132.0 lb

## 2020-03-10 DIAGNOSIS — S86301A Unspecified injury of muscle(s) and tendon(s) of peroneal muscle group at lower leg level, right leg, initial encounter: Secondary | ICD-10-CM

## 2020-03-10 DIAGNOSIS — M25571 Pain in right ankle and joints of right foot: Secondary | ICD-10-CM

## 2020-03-10 DIAGNOSIS — M7661 Achilles tendinitis, right leg: Secondary | ICD-10-CM

## 2020-03-10 NOTE — Patient Instructions (Addendum)
Thank you for coming in today.  Please get an Xray today before you leave  I've referred you to Physical Therapy.  Let us know if you don't hear from them in one week.  Please use voltaren gel up to 4x daily for pain as needed.   I recommend you obtained a compression sleeve to help with your joint problems. There are many options on the market however I recommend obtaining a full ankle Body Helix compression sleeve.  You can find information (including how to appropriate measure yourself for sizing) can be found at www.Body [employer].ch.  Many of these products are health savings account (HSA) eligible.   You can use the compression sleeve at any time throughout the day but is most important to use while being active as well as for 2 hours post-activity.   It is appropriate to ice following activity with the compression sleeve in place.  Recheck in about 6 weeks.     Peroneal Tendon Subluxation and Dislocation  The peroneal tendons are strong bands of tissue that connect a bone in the foot to the muscles that let you turn your feet outward and stand on your toes (peroneal muscles). The tendons pass under the ankle and into a groove of bone. Peroneal tendon subluxation and dislocation are injuries that happen when the peroneal tendons move out of this groove. The injuries can make your ankle weak and cause your ankle and foot to hurt. What are the causes? This condition may be caused by:  A severe ankle sprain.  Wear and tear from repeated ankle stress (overuse). What increases the risk? You are more likely to develop this condition if you participate in sports or activities in which there is a risk of spraining your ankle or having your foot be forcefully pushed up and in. These include:  Skiing.  Hockey.  Ice skating.  Football.  Soccer.  Basketball.  Gymnastics. What are the signs or symptoms? Symptoms of this condition may start suddenly or develop gradually. Symptoms  include:  Pain in the back of the ankle.  A feeling or sound like a snap or pop in the ankle.  Swelling or bruising in the ankle.  A weak and wobbly ankle or a feeling that your ankle is giving way (ankle instability).  Tenderness in the ankle. How is this diagnosed? This condition may be diagnosed based on:  Your symptoms.  Your medical history.  A physical exam.  Tests, such as: ? An X-ray or a CT scan. These may be done to check your ankle bone. ? An MRI or an ultrasound. These may be done to check your tendon. How is this treated? Treatment for a subluxation injury may include:  Keeping your body weight off your ankle for up to 6 weeks.  Wearing a boot or cast to support your ankle and keep it still.  Applying ice to your ankle to reduce swelling.  Taking anti-inflammatory pain medicine.  Having medicines injected into your tendon to reduce swelling.  Doing exercises to improve movement and strength (physical therapy).  Having surgery. This may be needed if the injury comes back or does not get better. Treatment for a dislocation injury usually involves surgery to:  Repair any tears in the tissue that holds your tendons in place.  Create a groove behind the ankle.  Secure the tendons in place with stitches (sutures), screws, or pins. Follow these instructions at home: Medicines  Take over-the-counter and prescription medicines only as told by  your health care provider.  Ask your health care provider if the medicine prescribed to you requires you to avoid driving or using heavy machinery. If you have a boot:  Wear it as told by your health care provider. Remove it only as told by your health care provider.  Loosen it if your toes tingle, become numb, or turn cold and blue.  Keep it clean and dry. If you have a cast:  Do not put pressure on any part of the cast until it is fully hardened. This may take several hours.  Do not stick anything inside the  cast to scratch your skin. Doing that increases your risk of infection.  Check the skin around the cast every day. Tell your health care provider about any concerns.  You may put lotion on dry skin around the edges of the cast. Do not put lotion on the skin underneath the cast.  Keep the cast clean and dry. Bathing  Do not take baths, swim, or use a hot tub until your health care provider approves. Ask your health care provider if you may take showers.  If the boot or cast is not waterproof: ? Do not let it get wet. ? Cover it with a watertight covering when you take a bath or shower. Managing pain, stiffness, and swelling   If directed, put ice on the injured area. ? Put ice in a plastic bag. ? Place a towel between your skin and the bag. ? Leave the ice on for 20 minutes, 2-3 times a day.  Move your toes often to reduce stiffness and swelling.  Raise (elevate) the injured area above the level of your heart while you are sitting or lying down. Activity  Do not use your leg to support your body weight until your health care provider says that you can. Use crutches or a walker as told by your health care provider.  Do not do any activities that make pain or swelling worse.  Do exercises as told by your health care provider.  Return to your normal activities as told by your health care provider. Ask your health care provider what activities are safe for you. General instructions  Ask your health care provider when it is safe to drive if you have a boot or cast on your leg.  Do not use any products that contain nicotine or tobacco, such as cigarettes, e-cigarettes, and chewing tobacco. These can delay healing. If you need help quitting, ask your health care provider.  Keep all follow-up visits as told by your health care provider. This is important. How is this prevented?  Wear supportive footwear that is appropriate for your athletic activity.  Avoid activities that cause  pain or swelling in your ankle or foot.  See your health care provider if you have pain or swelling that does not improve after a few days of rest.  Stop training if you develop pain or swelling.  If you start a new athletic activity, start gradually so you can build up your strength and flexibility. Contact a health care provider if:  Your symptoms get worse.  Your symptoms do not improve in 2-4 weeks.  You have new, unexplained symptoms.  Your boot or cast becomes loose or damaged. Summary  Peroneal tendon subluxation and dislocation are injuries that happen when the strong bands of tissue that connect a muscle to a bone in the foot (peroneal tendons) move out of place.  The injuries can make your ankle weak  and cause your ankle and foot to hurt.  This condition is caused by a severe ankle sprain or overuse of your foot and ankle.  Treatment includes rest, ice, medicines, exercises to improve movement and strength (physical therapy), and surgery if needed. This information is not intended to replace advice given to you by your health care provider. Make sure you discuss any questions you have with your health care provider. Document Revised: 08/14/2018 Document Reviewed: 08/14/2018 Elsevier Patient Education  2020 Elsevier Inc.    Rosen's Emergency Medicine: Concepts and Clinical Practice (9th ed., pp. [phone]). [place], [place]: Elsevier, Inc. Retrieved from https://www.clinicalkey.com/#!/content/book/3-s2.0-[email or web address]?scrollTo=%23hl0000251">  Achilles Tendinitis  Achilles tendinitis is inflammation of the tough, cord-like band that attaches the lower leg muscles to the heel bone (Achilles tendon). This is usually caused by overusing the tendon and the ankle joint. Achilles tendinitis usually gets better over time with treatment and caring for yourself at home. It can take weeks or months to heal completely. What are the causes? This condition may be caused  by:  A sudden increase in exercise or activity, such as running.  Doing the same exercises or activities, such as jumping, over and over.  Not warming up calf muscles before exercising.  Exercising in shoes that are worn out or not made for exercise.  Having arthritis or a bone growth (spur) on the back of the heel bone. This can rub against the tendon and hurt it.  Age-related wear and tear. Tendons become less flexible with age and are more likely to be injured. What are the signs or symptoms? Common symptoms of this condition include:  Pain in the Achilles tendon or in the back of the leg, just above the heel. The pain usually gets worse with exercise.  Stiffness or soreness in the back of the leg, especially in the morning.  Swelling of the skin over the Achilles tendon.  Thickening of the tendon.  Trouble standing on tiptoe. How is this diagnosed? This condition is diagnosed based on your symptoms and a physical exam. You may have tests, including:  X-rays.  MRI. How is this treated? The goal of treatment is to relieve symptoms and help your injury heal. Treatment may include:  Decreasing or stopping activities that caused the tendinitis. This may mean switching to low-impact exercises like biking or swimming.  Icing the injured area.  Doing physical therapy, including strengthening and stretching exercises.  Taking NSAIDs, such as ibuprofen, to help relieve pain and swelling.  Using supportive shoes, wraps, heel lifts, or a walking boot (air cast).  Having surgery. This may be done if your symptoms do not improve after other treatments.  Using high-energy shock wave impulses to stimulate the healing process (extracorporeal shock wave therapy). This is rare.  Having an injection of medicines that help relieve inflammation (corticosteroids). This is rare. Follow these instructions at home: If you have an air cast:  Wear the air cast as told by your health care  provider. Remove it only as told by your health care provider.  Loosen it if your toes tingle, become numb, or turn cold and blue.  Keep it clean.  If the air cast is not waterproof: ? Do not let it get wet. ? Cover it with a watertight covering when you take a bath or shower. Managing pain, stiffness, and swelling   If directed, put ice on the injured area. To do this: ? If you have a removable air cast, remove it as told by your health  care provider. ? Put ice in a plastic bag. ? Place a towel between your skin and the bag. ? Leave the ice on for 20 minutes, 2-3 times a day.  Move your toes often to reduce stiffness and swelling.  Raise (elevate) your foot above the level of your heart while you are sitting or lying down. Activity  Gradually return to your normal activities as told by your health care provider. Ask your health care provider what activities are safe for you.  Do not do activities that cause pain.  Consider doing low-impact exercises, like cycling or swimming.  Ask your health care provider when it is safe to drive if you have an air cast on your foot.  If physical therapy was prescribed, do exercises as told by your health care provider or physical therapist. General instructions  If directed, wrap your foot with an elastic bandage or other wrap. This can help to keep your tendon from moving too much while it heals. Your health care provider will show you how to wrap your foot correctly.  Wear supportive shoes or heel lifts only as told by your health care provider.  Take over-the-counter and prescription medicines only as told by your health care provider.  Keep all follow-up visits as told by your health care provider. This is important. Contact a health care provider if you:  Have symptoms that get worse.  Have pain that does not get better with medicine.  Develop new, unexplained symptoms.  Develop warmth and swelling in your foot.  Have a  fever. Get help right away if you:  Have a sudden popping sound or sensation in your Achilles tendon followed by severe pain.  Cannot move your toes or foot.  Cannot put any weight on your foot.  Your foot or toes become numb and look white or blue even after loosening your bandage or air cast. Summary  Achilles tendinitis is inflammation of the tough, cord-like band that attaches the lower leg muscles to the heel bone (Achilles tendon).  This condition is usually caused by overusing the tendon and the ankle joint. It can also be caused by arthritis or normal aging.  The most common symptoms of this condition include pain, swelling, or stiffness in the Achilles tendon or in the back of the leg.  This condition is usually treated by decreasing or stopping activities that caused the tendinitis, icing the injured area, taking NSAIDs, and doing physical therapy. This information is not intended to replace advice given to you by your health care provider. Make sure you discuss any questions you have with your health care provider. Document Revised: 10/12/2018 Document Reviewed: 10/12/2018 Elsevier Patient Education  2020 [employer]

## 2020-03-10 NOTE — Progress Notes (Signed)
Subjective:    I'm seeing this patient as a consultation for:  Dr Selena Batten. Note will be routed back to referring provider/PCP.  CC: Right ankle pain   HPI: Patient is a 34 year old female presenting to [facility] for Right ankle pain that started a couple months ago and describes pain as tight no MOI. Patient locates pain to lateral ankle and tender on the achillis tendon and rates pain as mor of a discomfort and that is a 4/10. 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  Swelling: no Mechanical symptoms: yes  Aggravating symptoms: being on feet for long hours Tried: chiropractor popping ankle;   Past medical history, Surgical history, Family history, Social history, Allergies, and medications have been entered into the medical record, reviewed.   Review of Systems: No new headache, visual changes, nausea, vomiting, diarrhea, constipation, dizziness, abdominal pain, skin rash, fevers, chills, night sweats, weight loss, swollen lymph nodes, body aches, joint swelling, muscle aches, chest pain, shortness of breath, mood changes, visual or auditory hallucinations.   Objective:    Vitals:   03/10/20 0842  BP: 100/70  Pulse: 65  SpO2: 98%   General: Well Developed, well nourished, and in no acute distress.  Neuro/Psych: Alert and oriented x3, extra-ocular muscles intact, able to move all 4 extremities, sensation grossly intact. Skin: Warm and dry, no rashes noted.  Respiratory: Not using accessory muscles, speaking in full sentences, trachea midline.  Cardiovascular: Pulses palpable, no extremity edema. Abdomen: Does not appear distended. MSK: Right ankle normal-appearing Normal motion.  With ankle motion palpable snap at posterior aspect of lateral malleolus.  This is not particularly painful. Ligament exam testing slight laxity to talar tilt testing otherwise normal. Intact strength. Tender palpation mildly  at posterior calcaneus and Achilles tendon insertion otherwise nontender. Pulses cap refill and sensation are intact distally.  Lab and Radiology Results  X-ray images right ankle obtained today personally and independently reviewed Normal-appearing no fractures malalignment or significant DJD. Await formal radiology review  Diagnostic Limited MSK Ultrasound of: Right ankle Peroneal tendons intact normal-appearing with dynamic motion of the brevis tendon snaps underneath the longus tendon without fully subluxate out of the joint Achilles tendon insertion calcific change at distal tendon insertion with mild tenderness to palpation with ultrasound probe.  Otherwise Achilles tendon is normal. Bony structures normal-appearing otherwise No significant joint effusion Impression: Snapping peroneal tendons and insertional Achilles tendinopathy   Impression and Recommendations:    Assessment and Plan: 34 y.o. female with posterior lateral ankle pain due to insertional Achilles tendinitis and snapping or subluxing peroneal tendon/tendinitis.  Plan to treat with physical therapy compressive ankle sleeve and Voltaren gel.  Recheck back in about 6 weeks.  Return sooner if needed.  Precautions reviewed.Marland Kitchen  PDMP not reviewed this encounter. Orders Placed This Encounter  Procedures  . [place] LIMITED JOINT SPACE STRUCTURES LOW RIGHT(NO LINKED CHARGES)    Standing Status:   Future    Number of Occurrences:   1    Standing Expiration Date:   03/10/2021    Order Specific Question:   Reason for Exam (SYMPTOM  OR DIAGNOSIS REQUIRED)    Answer:   Right ankle pain    Order Specific Question:   Preferred imaging location?    Answer:   [facility]  . DG Ankle Complete Right    Standing Status:   Future    Number  of Occurrences:   1    Standing Expiration Date:   03/10/2021    Order Specific Question:   Reason for Exam (SYMPTOM  OR DIAGNOSIS REQUIRED)    Answer:   eval ankle pain     Order Specific Question:   Is patient pregnant?    Answer:   No    Order Specific Question:   Preferred imaging location?    Answer:   [facility]  . Ambulatory referral to Physical Therapy    Referral Priority:   Routine    Referral Type:   Physical Medicine    Referral Reason:   Specialty Services Required    Requested Specialty:   Physical Therapy   No orders of the defined types were placed in this encounter.   Discussed warning signs or symptoms. Please see discharge instructions. Patient expresses understanding.   The above documentation has been reviewed and is accurate and complete Clementeen Graham, M.D.

## 2020-03-13 ENCOUNTER — Ambulatory Visit (HOSPITAL_BASED_OUTPATIENT_CLINIC_OR_DEPARTMENT_OTHER): Payer: BC Managed Care – PPO | Attending: Pulmonary Disease | Admitting: Pulmonary Disease

## 2020-03-13 ENCOUNTER — Encounter (HOSPITAL_BASED_OUTPATIENT_CLINIC_OR_DEPARTMENT_OTHER): Payer: BC Managed Care – PPO | Admitting: Pulmonary Disease

## 2020-03-13 ENCOUNTER — Other Ambulatory Visit: Payer: Self-pay

## 2020-03-13 DIAGNOSIS — G4710 Hypersomnia, unspecified: Secondary | ICD-10-CM | POA: Diagnosis not present

## 2020-03-13 NOTE — Progress Notes (Signed)
X-ray right ankle looks normal to radiology

## 2020-03-14 ENCOUNTER — Ambulatory Visit (HOSPITAL_BASED_OUTPATIENT_CLINIC_OR_DEPARTMENT_OTHER): Payer: BC Managed Care – PPO | Attending: Pulmonary Disease | Admitting: Pulmonary Disease

## 2020-03-14 DIAGNOSIS — G4710 Hypersomnia, unspecified: Secondary | ICD-10-CM

## 2020-03-17 ENCOUNTER — Telehealth: Payer: Self-pay

## 2020-03-17 DIAGNOSIS — G4710 Hypersomnia, unspecified: Secondary | ICD-10-CM

## 2020-03-17 NOTE — Procedures (Signed)
Patient Name: Kim Pacheco, Kim Pacheco Date: 03/13/2020 Gender: Female D.O.B: 1985/12/28 Age (years): 34 Referring Provider: Cyril Mourning MD, ABSM Height (inches): 59 Interpreting Physician: Cyril Mourning MD, ABSM Weight (lbs): 130 RPSGT: [place] Sink BMI: 26 MRN: [id number] Neck Size: 14.00 <br> <br> CLINICAL INFORMATION Sleep Study Type: NPSG    Indication for sleep study: excessive daytime somnolence    Epworth Sleepiness Score: 8    SLEEP STUDY TECHNIQUE As per the AASM Manual for the Scoring of Sleep and Associated Events v2.3 (April 2016) with a hypopnea requiring 4% desaturations.  The channels recorded and monitored were frontal, central and occipital EEG, electrooculogram (EOG), submentalis EMG (chin), nasal and oral airflow, thoracic and abdominal wall motion, anterior tibialis EMG, snore microphone, electrocardiogram, and pulse oximetry.  MEDICATIONS Medications self-administered by patient taken the night of the study : N/A . She reported 3 glasses of wine evening prior to study  SLEEP ARCHITECTURE The study was initiated at 10:42:16 PM and ended at 6:13:43 AM.  Sleep onset time was 28.8 minutes and the sleep efficiency was 88.4%%. The total sleep time was 399 minutes.  Stage REM latency was 74.0 minutes.  The patient spent 3.5%% of the night in stage N1 sleep, 59.5%% in stage N2 sleep, 12.3%% in stage N3 and 24.7% in REM.  Alpha intrusion was absent.  Supine sleep was 25.05%.  RESPIRATORY PARAMETERS The overall apnea/hypopnea index (AHI) was 1.1 per hour. There were 0 total apneas, including 0 obstructive, 0 central and 0 mixed apneas. There were 7 hypopneas and 4 RERAs.  The AHI during Stage REM sleep was 4.3 per hour.  AHI while supine was 1.8 per hour.  The mean oxygen saturation was 96.8%. The minimum SpO2 during sleep was 87.0%.  loud snoring was noted during this study.  CARDIAC DATA The 2 lead EKG demonstrated sinus rhythm. The mean  heart rate was 64.2 beats per minute. Other EKG findings include: None.   LEG MOVEMENT DATA The total PLMS were 0 with a resulting PLMS index of 0.0. Associated arousal with leg movement index was 0.0 .  IMPRESSIONS - No significant obstructive sleep apnea occurred during this study (AHI = 1.1/h). - No significant central sleep apnea occurred during this study (CAI = 0.0/h). - Mild oxygen desaturation was noted during this study (Min O2 = 87.0%). - REM latency was 74 mins. 4 periods of REM sleep noted. She reported 3 glasses of wine in the evening prior to study - The patient snored with loud snoring volume. - No cardiac abnormalities were noted during this study. - Clinically significant periodic limb movements did not occur during sleep. No significant associated arousals.   DIAGNOSIS No cause for hypersomnolenece identified on this study   RECOMMENDATIONS - Avoid alcohol, sedatives and other CNS depressants that may  disrupt normal sleep architecture. - Sleep hygiene should be reviewed to assess factors that may improve sleep quality. - Weight management and regular exercise should be initiated or continued if appropriate.  Cyril Mourning MD Board Certified in Sleep medicine

## 2020-03-17 NOTE — Procedures (Signed)
Patient Name: Kim Pacheco, Demo Date: 03/14/2020 Gender: Female D.O.B: 07-19-1985 Age (years): 34 Referring Provider: Cyril Mourning MD, ABSM Height (inches): 59 Interpreting Physician: Cyril Mourning MD, ABSM Weight (lbs): 130 RPSGT: [place] BMI: 26 MRN: [id number] Neck Size: 14.00 <br> <br> CLINICAL INFORMATION Sleep Study Type: MSLT    The patient was referred to the sleep center for evaluation of daytime sleepiness.    Epworth Sleepiness Score: 8  SLEEP STUDY TECHNIQUE A Multiple Sleep Latency Test was performed after an overnight polysomnogram according to the AASM scoring manual v2.3 (April 2016) and clinical guidelines. Five nap opportunities occurred over the course of the test which followed an overnight polysomnogram. The channels recorded and monitored were frontal, central, and occipital electroencephalography (EEG), right and left electrooculogram (EOG), chin electromyography (EMG), and electrocardiogram (EKG).  MEDICATIONS Medications taken by the patient : N/A Medications administered by patient during sleep study : No sleep medicine administered. IMPRESSIONS - Total number of naps attempted: 5 . Total number of naps with sleep attained: . The Mean Sleep Latency was 2.20 minutes. There were 2 sleep-onset REM periods. - The patient appears to have pathologic sleepiness, evidenced by a short mean sleep latency (8 minutes or less) on this MSLT. - 2 sleep onset REMs were noted during this MSLT.  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

## 2020-03-17 NOTE — Telephone Encounter (Signed)
-----   Message from Rakesh Alva V, MD sent at 03/17/2020  4:50 PM EDT ----- °Pl arrange FU OV with me next week please ° ° °RA ° °

## 2020-03-17 NOTE — Telephone Encounter (Signed)
Attempted to call patient to schedule office visit next week with Dr Vassie Loll per Dr Vassie Loll. Left message on voicemail to please return call. Contact number provided.

## 2020-03-21 ENCOUNTER — Telehealth: Payer: Self-pay

## 2020-03-21 NOTE — Telephone Encounter (Signed)
-----   Message from Oretha Milch, MD sent at 03/17/2020  4:50 PM EDT ----- Pl arrange FU OV with me next week please   RA

## 2020-03-21 NOTE — Telephone Encounter (Signed)
Called and spoke with patient to schedule an office visit this week per Dr Vassie Loll. Patient ok to come Thursday, 03/23/2020 at noon to [facility] office. Dr Vassie Loll ok with putting patient in noon time slot. Patient expressed understanding and agreeable to time, date and location of appointment. Nothing further needed at this time.

## 2020-03-22 ENCOUNTER — Telehealth: Payer: Self-pay

## 2020-03-22 NOTE — Telephone Encounter (Signed)
-----   Message from Rakesh Alva V, MD sent at 03/17/2020  4:50 PM EDT ----- °Pl arrange FU OV with me next week please ° ° °RA ° °

## 2020-03-22 NOTE — Telephone Encounter (Signed)
Called and spoke with patient on 03/21/2020 and scheduled appointment with Dr Vassie Loll for Thursday, October 14th, 2021 at noon. Dr Vassie Loll gave verbal that this is ok to schedule at this time. Patient expressed understanding and agreeable to time, date and location. Nothing further needed at this time.

## 2020-03-23 ENCOUNTER — Ambulatory Visit: Payer: BC Managed Care – PPO | Admitting: Pulmonary Disease

## 2020-03-30 ENCOUNTER — Encounter: Payer: Self-pay | Admitting: Pulmonary Disease

## 2020-03-30 ENCOUNTER — Ambulatory Visit: Payer: BC Managed Care – PPO | Admitting: Pulmonary Disease

## 2020-03-30 ENCOUNTER — Other Ambulatory Visit: Payer: Self-pay

## 2020-03-30 DIAGNOSIS — G47419 Narcolepsy without cataplexy: Secondary | ICD-10-CM | POA: Diagnosis not present

## 2020-03-30 DIAGNOSIS — F1010 Alcohol abuse, uncomplicated: Secondary | ICD-10-CM | POA: Diagnosis not present

## 2020-03-30 NOTE — Assessment & Plan Note (Addendum)
We discussed the diagnosis of narcolepsy without cataplexy - SOREMs in 2/5 naps Reviewed sleep diary and and PSG/MSLT  Discussed importance of scheduled bedtime and wake up times , naps and exercise Ideal would be 8 hours of nighttime sleep with 2 naps of 1 hour each Otherwise, can do 9 hours of nighttime sleep with at least one nap of 1 hour  Okay to use caffeine in the morning and another cup in the afternoon, no caffeine after 5 PM Decrease alcohol intake.   We discussed stimulant called modafinil Elvia Collum -she would like to hold off

## 2020-03-30 NOTE — Assessment & Plan Note (Signed)
Discussed effects of alcohol on sleep Advised her to try to cut down

## 2020-03-30 NOTE — Patient Instructions (Addendum)
We discussed the diagnosis of narcolepsy without cataplexy since you had REM sleep in 2/5 naps  Discussed importance of scheduled bedtime and wake up times , naps and exercise Ideal would be 8 hours of nighttime sleep with 2 naps of 1 hour each Otherwise, can do 9 hours of nighttime sleep with at least one nap of 1 hour  Okay to use caffeine in the morning and another cup in the afternoon, no caffeine after 5 PM Decrease alcohol intake.   We discussed stimulant called modafinil Kim Pacheco

## 2020-03-30 NOTE — Progress Notes (Signed)
   Subjective:    Patient ID: Kim Pacheco, female    DOB: 03-02-1986, 34 y.o.   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 She does have some exercise during the week. Excessive alcohol intake is noted -2 to 5 glasses of wine daily.  We reviewed results of an PSG and MSLT today   Significant tests/ events reviewed NPSG 03/2020 >> no OSA or PLM's MSLT 03/2020 >> mean sleep latency 2.2 mins , SOREMs 2/5  Review of Systems neg for any significant sore throat, dysphagia, itching, sneezing, nasal congestion or excess/ purulent secretions, fever, chills, sweats, unintended wt loss, pleuritic or exertional cp, hempoptysis, orthopnea pnd or change in chronic leg swelling. Also denies presyncope, palpitations, heartburn, abdominal pain, nausea, vomiting, diarrhea or change in bowel or urinary habits, dysuria,hematuria, rash, arthralgias, visual complaints, headache, numbness weakness or ataxia.     Objective:   Physical Exam  Gen. Pleasant, well-nourished, in no distress ENT - no thrush, no pallor/icterus,no post nasal drip Neck: No JVD, no thyromegaly, no carotid bruits Lungs: no use of accessory muscles, no dullness to percussion, clear without rales or rhonchi  Cardiovascular: Rhythm regular, heart sounds  normal, no murmurs or gallops, no peripheral edema Musculoskeletal: No deformities, no cyanosis or clubbing         Assessment & Plan:

## 2020-04-06 ENCOUNTER — Encounter: Payer: Self-pay | Admitting: Physical Therapy

## 2020-04-06 ENCOUNTER — Other Ambulatory Visit: Payer: Self-pay

## 2020-04-06 ENCOUNTER — Ambulatory Visit: Payer: BC Managed Care – PPO | Admitting: Physical Therapy

## 2020-04-06 DIAGNOSIS — M25571 Pain in right ankle and joints of right foot: Secondary | ICD-10-CM

## 2020-04-07 ENCOUNTER — Other Ambulatory Visit: Payer: Self-pay

## 2020-04-07 ENCOUNTER — Emergency Department (HOSPITAL_COMMUNITY): Payer: BC Managed Care – PPO

## 2020-04-07 ENCOUNTER — Emergency Department (HOSPITAL_COMMUNITY)
Admission: EM | Admit: 2020-04-07 | Discharge: 2020-04-07 | Disposition: A | Discharge status: Home / Self Care | Payer: BC Managed Care – PPO | Attending: Emergency Medicine | Admitting: Emergency Medicine

## 2020-04-07 ENCOUNTER — Encounter (HOSPITAL_COMMUNITY): Payer: Self-pay | Admitting: Emergency Medicine

## 2020-04-07 DIAGNOSIS — R1031 Right lower quadrant pain: Secondary | ICD-10-CM | POA: Diagnosis not present

## 2020-04-07 DIAGNOSIS — K5900 Constipation, unspecified: Secondary | ICD-10-CM | POA: Diagnosis not present

## 2020-04-07 DIAGNOSIS — R109 Unspecified abdominal pain: Secondary | ICD-10-CM | POA: Diagnosis not present

## 2020-04-07 DIAGNOSIS — Z87891 Personal history of nicotine dependence: Secondary | ICD-10-CM | POA: Diagnosis not present

## 2020-04-07 DIAGNOSIS — R102 Pelvic and perineal pain: Secondary | ICD-10-CM | POA: Diagnosis not present

## 2020-04-07 DIAGNOSIS — R300 Dysuria: Secondary | ICD-10-CM | POA: Diagnosis not present

## 2020-04-07 LAB — CBC
HCT: 41 % (ref 36.0–46.0)
Hemoglobin: 13.8 g/dL (ref 12.0–15.0)
MCH: 30.9 pg (ref 26.0–34.0)
MCHC: 33.7 g/dL (ref 30.0–36.0)
MCV: 91.7 fL (ref 80.0–100.0)
Platelets: 233 10*3/uL (ref 150–400)
RBC: 4.47 MIL/uL (ref 3.87–5.11)
RDW: 12.2 % (ref 11.5–15.5)
WBC: 5.9 10*3/uL (ref 4.0–10.5)
nRBC: 0 % (ref 0.0–0.2)

## 2020-04-07 LAB — COMPREHENSIVE METABOLIC PANEL WITH GFR
ALT: 50 U/L — ABNORMAL HIGH (ref 0–44)
AST: 39 U/L (ref 15–41)
Albumin: 4.2 g/dL (ref 3.5–5.0)
Alkaline Phosphatase: 44 U/L (ref 38–126)
Anion gap: 11 (ref 5–15)
BUN: 10 mg/dL (ref 6–20)
CO2: 25 mmol/L (ref 22–32)
Calcium: 8.9 mg/dL (ref 8.9–10.3)
Chloride: 102 mmol/L (ref 98–111)
Creatinine, Ser: 0.74 mg/dL (ref 0.44–1.00)
GFR, Estimated: >60 mL/min
Glucose, Bld: 106 mg/dL — ABNORMAL HIGH (ref 70–99)
Potassium: 4.4 mmol/L (ref 3.5–5.1)
Sodium: 138 mmol/L (ref 135–145)
Total Bilirubin: 0.9 mg/dL (ref 0.3–1.2)
Total Protein: 7.9 g/dL (ref 6.5–8.1)

## 2020-04-07 LAB — URINALYSIS, ROUTINE W REFLEX MICROSCOPIC
Bilirubin Urine: NEGATIVE
Glucose, UA: NEGATIVE mg/dL
Hgb urine dipstick: NEGATIVE
Ketones, ur: NEGATIVE mg/dL
Leukocytes,Ua: NEGATIVE
Nitrite: NEGATIVE
Protein, ur: NEGATIVE mg/dL
Specific Gravity, Urine: 1.008 (ref 1.005–1.030)
pH: 6 (ref 5.0–8.0)

## 2020-04-07 LAB — LIPASE, BLOOD: Lipase: 29 U/L (ref 11–51)

## 2020-04-07 LAB — I-STAT BETA HCG BLOOD, ED (MC, WL, AP ONLY): I-stat hCG, quantitative: <5 m[IU]/mL (ref <5)

## 2020-04-07 MED ORDER — KETOROLAC TROMETHAMINE 30 MG/ML IJ SOLN
15.0000 mg | Freq: Once | INTRAMUSCULAR | Status: AC
  Administered 2020-04-07: 15 mg via INTRAVENOUS
  Filled 2020-04-07: qty 1

## 2020-04-07 MED ORDER — NAPROXEN 375 MG PO TABS: 375.0000 mg | ORAL_TABLET | Freq: Two times a day (BID) | ORAL | 0 refills | Status: DC

## 2020-04-07 MED ORDER — ONDANSETRON HCL 4 MG/2ML IJ SOLN
4.0000 mg | Freq: Once | INTRAMUSCULAR | Status: AC
  Administered 2020-04-07: 4 mg via INTRAVENOUS
  Filled 2020-04-07: qty 2

## 2020-04-07 MED ORDER — ONDANSETRON 4 MG PO TBDP: 4.0000 mg | ORAL_TABLET | Freq: Three times a day (TID) | ORAL | 0 refills | Status: DC | PRN

## 2020-04-07 MED ORDER — MORPHINE SULFATE (PF) 4 MG/ML IV SOLN
4.0000 mg | Freq: Once | INTRAVENOUS | Status: AC
  Administered 2020-04-07: 4 mg via INTRAVENOUS
  Filled 2020-04-07: qty 1

## 2020-04-07 NOTE — ED Provider Notes (Signed)
[facility]-EMERGENCY DEPT Provider Note   CSN: [id number] Arrival date & time: 04/07/20  1235     History Chief Complaint  Patient presents with  . Abdominal Pain    Kim Pacheco is a 34 y.o. female past medical history significant for abdominal pain, constipation, gestational diabetes that is resolved, hepatitis B.  HPI Patient presents to emergency department today with chief complaint of abdominal pain x1 day.  States the pain is located in her lower pelvis and radiates to her back.  She states she has the pain on urinating or having a bowel movement.  She describes the pain as sharp.  She rates the pain 7 of 10 in severity.  She did not take any medications for symptoms prior to arrival.  She endorses nausea without emesis.  She states she had similar pain x2 years ago and was diagnosed with a UTI.  She did not take any medications for symptoms prior to arrival.  She does also endorse a mucus-like discharge.  She is sexually active with only her husband.  She states with her history of constipation she takes MiraLAX frequently.  She did take some this morning before having bowel movements.  She states that she continued to have pain after the bowel movement.  She denies any fever, chills, vaginal itching, abnormal vaginal bleeding.  Denies any history of STDs.  LMP was 03/14/2020.    Past Medical History:  Diagnosis Date  . Abdominal pain    started in mid abd and spread to left side/on and off/since Jan 2020/ pt states, she has fluid in abd/unknown  . Abnormal Pap smear    normal 06/2013  . Anxiety   . Constipation    hx of  . Depression    suicide attempt in college  . Diabetes mellitus without complication (HCC)    gestational, no issues since pregnancy per her report - resolved  . Gestational diabetes    /no meds now - resolved  . Hepatitis    Hep B, managed by Dr. Kinnie Scales  . History of chicken pox   . History of UTI   . Vaginal Pap smear,  abnormal     Patient Active Problem List   Diagnosis Date Noted  . Narcolepsy without cataplexy 02/02/2020  . Depression, major, single episode, moderate (HCC) 10/28/2018  . ARF (acute renal failure) (HCC) 06/25/2018  . Alcohol abuse 06/24/2018  . Constipation 06/24/2018  . Ascites 06/24/2018  . Body mass index (BMI) of 25.0 to 29.9 05/29/2018  . Fibrocystic disease of breast 05/29/2018  . Metrorrhagia 05/29/2018  . Urinary tract infectious disease 05/29/2018  . Oral hypoglycemic controlled White classification A2 gestational diabetes mellitus (GDM) 11/18/2017  . Previous cesarean delivery, delivered: arrest of dilation  11/18/2017  . Abnormal glucose tolerance test (GTT) during pregnancy, antepartum 09/17/2017  . Hepatitis B 06/14/2014    Past Surgical History:  Procedure Laterality Date  . CESAREAN SECTION N/A 01/26/2013   Procedure: Primary Cesarean Section Delivery Baby  Boy @ 0017, Apgars 4/8/9;  Surgeon: Freddrick March. Tenny Craw, MD;  Location: [facility] ORS;  Service: Obstetrics;  Laterality: N/A;  . CESAREAN SECTION N/A 11/18/2017   Procedure: CESAREAN SECTION;  Surgeon: Olivia Mackie, MD;  Location: [facility] BIRTHING SUITES;  Service: Obstetrics;  Laterality: N/A;  . COLONOSCOPY  2020  . ESOPHAGOGASTRODUODENOSCOPY ENDOSCOPY    . laproscopy  2020  . NASAL SINUS SURGERY  1996  . WISDOM TOOTH EXTRACTION       OB History  Gravida  7   Para  2   Term  2   Preterm      AB  5   Living  2     SAB  0   TAB  5   Ectopic      Multiple  0   Live Births  2           Family History  Adopted: Yes    Social History   Tobacco Use  . Smoking status: Former Smoker    Packs/day: 1.00    Types: Cigarettes    Quit date: 07/23/2011    Years since quitting: 8.7  . Smokeless tobacco: Never Used  . Tobacco comment: smoked for 8 years, quit in 2013  Vaping Use  . Vaping Use: Never used  Substance Use Topics  . Alcohol use: Yes    Alcohol/week: 5.0 standard drinks    Types: 5  Glasses of wine per week  . Drug use: Not Currently    Types: Marijuana    Comment: Last use 12/19/18    Home Medications Prior to Admission medications   Medication Sig Start Date End Date Taking? Authorizing Provider  naproxen (NAPROSYN) 375 MG tablet Take 1 tablet (375 mg total) by mouth 2 (two) times daily with a meal for 7 days. 04/07/20 04/14/20  Teala Daffron E, PA-C  ondansetron (ZOFRAN ODT) 4 MG disintegrating tablet Take 1 tablet (4 mg total) by mouth every 8 (eight) hours as needed for nausea or vomiting. 04/07/20   Keaton Stirewalt E, PA-C    Allergies    Contrast media [iodinated diagnostic agents]  Review of Systems   Review of Systems  All other systems are reviewed and are negative for acute change except as noted in the HPI.   Physical Exam Updated Vital Signs BP 134/81 (BP Location: Left Arm)   Pulse 81   Temp 98.6 F (37 C) (Oral)   Resp 18   Ht  (1.499 m)   Wt 62 kg   LMP 03/14/2020   SpO2 97%   BMI 27.61 kg/m   Physical Exam Vitals and nursing note reviewed.  Constitutional:      General: She is not in acute distress.    Appearance: She is not ill-appearing.  HENT:     Head: Normocephalic and atraumatic.     Right Ear: Tympanic membrane and external ear normal.     Left Ear: Tympanic membrane and external ear normal.     Nose: Nose normal.     Mouth/Throat:     Mouth: Mucous membranes are moist.     Pharynx: Oropharynx is clear.  Eyes:     General: No scleral icterus.       Right eye: No discharge.        Left eye: No discharge.     Extraocular Movements: Extraocular movements intact.     Conjunctiva/sclera: Conjunctivae normal.     Pupils: Pupils are equal, round, and reactive to light.  Neck:     Vascular: No JVD.  Cardiovascular:     Rate and Rhythm: Normal rate and regular rhythm.     Pulses: Normal pulses.          Radial pulses are 2+ on the right side and 2+ on the left side.     Heart sounds: Normal heart sounds.    Pulmonary:     Comments: Lungs clear to auscultation in all fields. Symmetric chest rise. No wheezing, rales, or rhonchi. Abdominal:  General: Bowel sounds are normal.     Tenderness: There is abdominal tenderness in the right lower quadrant and suprapubic area. There is no right CVA tenderness or left CVA tenderness.     Comments: Abdomen is soft, non-distended. No rigidity, no guarding. No peritoneal signs.  Musculoskeletal:        General: Normal range of motion.     Cervical back: Normal range of motion.  Skin:    General: Skin is warm and dry.     Capillary Refill: Capillary refill takes less than 2 seconds.  Neurological:     Mental Status: She is oriented to person, place, and time.     GCS: GCS eye subscore is 4. GCS verbal subscore is 5. GCS motor subscore is 6.     Comments: Fluent speech, no facial droop.  Psychiatric:        Behavior: Behavior normal.     ED Results / Procedures / Treatments   Labs (all labs ordered are listed, but only abnormal results are displayed) Labs Reviewed  COMPREHENSIVE METABOLIC PANEL - Abnormal; Notable for the following components:      Result Value   Glucose, Bld 106 (*)    ALT 50 (*)    All other components within normal limits  URINALYSIS, ROUTINE W REFLEX MICROSCOPIC - Abnormal; Notable for the following components:   Color, Urine STRAW (*)    All other components within normal limits  LIPASE, BLOOD  CBC  I-STAT BETA HCG BLOOD, ED ([facility], [facility], [facility] ONLY)    EKG None  Radiology US Transvaginal Non-OB  Result Date: 04/07/2020 CLINICAL DATA:  Pelvic pain EXAM: TRANSABDOMINAL AND TRANSVAGINAL ULTRASOUND OF PELVIS DOPPLER ULTRASOUND OF OVARIES TECHNIQUE: Both transabdominal and transvaginal ultrasound examinations of the pelvis were performed. Transabdominal technique was performed for global imaging of the pelvis including uterus, ovaries, adnexal regions, and pelvic cul-de-sac. It was necessary to proceed with endovaginal exam  following the transabdominal exam to visualize the ovaries. Color and duplex Doppler ultrasound was utilized to evaluate blood flow to the ovaries. COMPARISON:  CT dated 04/07/2020 FINDINGS: Uterus Measurements: 8.8 x 3.8 x 4.7 cm = volume: 81 mL. No fibroids or other mass visualized. There is a small amount of free fluid in the lower uterine segment. There is a probable nabothian cyst measuring 5 mm. Endometrium Thickness: 9 mm.  No focal abnormality visualized. Right ovary Measurements: 3 x 2.1 x 2.3 cm = volume: 7 mL. Normal appearance/no adnexal mass. Left ovary Measurements: 2.6 x 1.7 x 1.8 cm = volume: 4 mL. Normal appearance/no adnexal mass. Pulsed Doppler evaluation of both ovaries demonstrates normal low-resistance arterial and venous waveforms. Other findings There is a small volume of pelvic free fluid. This is better appreciated on the patient's prior CT. IMPRESSION: No acute abnormality.  No evidence for ovarian torsion. Electronically Signed   By: Katherine Mantle M.D.   On: 04/07/2020 17:28   US Pelvis Complete  Result Date: 04/07/2020 CLINICAL DATA:  Pelvic pain EXAM: TRANSABDOMINAL AND TRANSVAGINAL ULTRASOUND OF PELVIS DOPPLER ULTRASOUND OF OVARIES TECHNIQUE: Both transabdominal and transvaginal ultrasound examinations of the pelvis were performed. Transabdominal technique was performed for global imaging of the pelvis including uterus, ovaries, adnexal regions, and pelvic cul-de-sac. It was necessary to proceed with endovaginal exam following the transabdominal exam to visualize the ovaries. Color and duplex Doppler ultrasound was utilized to evaluate blood flow to the ovaries. COMPARISON:  CT dated 04/07/2020 FINDINGS: Uterus Measurements: 8.8 x 3.8 x 4.7 cm = volume:  81 mL. No fibroids or other mass visualized. There is a small amount of free fluid in the lower uterine segment. There is a probable nabothian cyst measuring 5 mm. Endometrium Thickness: 9 mm.  No focal abnormality visualized.  Right ovary Measurements: 3 x 2.1 x 2.3 cm = volume: 7 mL. Normal appearance/no adnexal mass. Left ovary Measurements: 2.6 x 1.7 x 1.8 cm = volume: 4 mL. Normal appearance/no adnexal mass. Pulsed Doppler evaluation of both ovaries demonstrates normal low-resistance arterial and venous waveforms. Other findings There is a small volume of pelvic free fluid. This is better appreciated on the patient's prior CT. IMPRESSION: No acute abnormality.  No evidence for ovarian torsion. Electronically Signed   By: Katherine Mantle M.D.   On: 04/07/2020 17:28   [place] Art/Ven Flow Abd Pelv Doppler  Result Date: 04/07/2020 CLINICAL DATA:  Pelvic pain EXAM: TRANSABDOMINAL AND TRANSVAGINAL ULTRASOUND OF PELVIS DOPPLER ULTRASOUND OF OVARIES TECHNIQUE: Both transabdominal and transvaginal ultrasound examinations of the pelvis were performed. Transabdominal technique was performed for global imaging of the pelvis including uterus, ovaries, adnexal regions, and pelvic cul-de-sac. It was necessary to proceed with endovaginal exam following the transabdominal exam to visualize the ovaries. Color and duplex Doppler ultrasound was utilized to evaluate blood flow to the ovaries. COMPARISON:  CT dated 04/07/2020 FINDINGS: Uterus Measurements: 8.8 x 3.8 x 4.7 cm = volume: 81 mL. No fibroids or other mass visualized. There is a small amount of free fluid in the lower uterine segment. There is a probable nabothian cyst measuring 5 mm. Endometrium Thickness: 9 mm.  No focal abnormality visualized. Right ovary Measurements: 3 x 2.1 x 2.3 cm = volume: 7 mL. Normal appearance/no adnexal mass. Left ovary Measurements: 2.6 x 1.7 x 1.8 cm = volume: 4 mL. Normal appearance/no adnexal mass. Pulsed Doppler evaluation of both ovaries demonstrates normal low-resistance arterial and venous waveforms. Other findings There is a small volume of pelvic free fluid. This is better appreciated on the patient's prior CT. IMPRESSION: No acute abnormality.  No  evidence for ovarian torsion. Electronically Signed   By: Katherine Mantle M.D.   On: 04/07/2020 17:28   CT Renal Stone Study  Result Date: 04/07/2020 CLINICAL DATA:  Lower abdominal and back region pain.  Dysuria. EXAM: CT ABDOMEN AND PELVIS WITHOUT CONTRAST TECHNIQUE: Multidetector CT imaging of the abdomen and pelvis was performed following the standard protocol without oral or IV contrast. COMPARISON:  June 26, 2018 FINDINGS: Lower chest: There is mild bibasilar atelectasis. There is no lung base edema or consolidation. Hepatobiliary: There is hepatic steatosis. No focal liver lesions are evident. Gallbladder wall is not appreciably thickened. There is no biliary duct dilatation. Pancreas: There is no pancreatic mass or inflammatory focus. Spleen: No splenic lesions are evident. Adrenals/Urinary Tract: Adrenals bilaterally appear normal. Kidneys bilaterally show no evident mass or hydronephrosis on either side. There is no appreciable renal or ureteral calculus. Urinary bladder is midline with wall thickness within normal limits. Stomach/Bowel: There is no appreciable bowel wall or mesenteric thickening. No evident bowel obstruction. The terminal ileum appears normal. There is no appreciable free air or portal venous air. Vascular/Lymphatic: There is no abdominal aortic aneurysm. No vascular lesions are evident on this noncontrast enhanced study. Reproductive: The uterus is anteverted. No adnexal mass. There is moderate fluid in the cul-de-sac. Other: The appendix appears normal. No evident abscess in the abdomen or pelvis. There is no ascites evident beyond the fluid in the cul-de-sac. Musculoskeletal: No blastic or lytic bone lesions.  No appreciable abdominal wall or intramuscular lesions. IMPRESSION: 1. Moderate fluid in the cul-de-sac. Suspect recent ovarian cyst rupture. No adnexal masses evident by CT. 2. No evident renal or ureteral calculus on either side. No hydronephrosis. Urinary bladder  wall thickness normal. 3. No bowel wall thickening or bowel obstruction. No abscess in the abdomen or pelvis. Appendix appears normal. 4.  Hepatic steatosis. Electronically Signed   By: Bretta BangWilliam  Woodruff III M.D.   On: 04/07/2020 15:40    Procedures Procedures (including critical care time)  Medications Ordered in ED Medications  morphine 4 MG/ML injection 4 mg (4 mg Intravenous Given 04/07/20 1357)  ondansetron (ZOFRAN) injection 4 mg (4 mg Intravenous Given 04/07/20 1356)  ondansetron (ZOFRAN) injection 4 mg (4 mg Intravenous Given 04/07/20 1604)  ketorolac (TORADOL) 30 MG/ML injection 15 mg (15 mg Intravenous Given 04/07/20 1813)    ED Course  I have reviewed the triage vital signs and the nursing notes.  Pertinent labs & imaging results that were available during my care of the patient were reviewed by me and considered in my medical decision making (see chart for details).  Clinical Course as of Apr 07 1908  Fri Apr 07, 2020  1824 Patient sleeping when BP checked  BP(!): 97/55 [KA]    Clinical Course User Index [KA] Saloni Lablanc, Caroleen HammanKaitlyn E, PA-C   MDM Rules/Calculators/A&P                          History provided by patient with additional history obtained from chart review.    Patient presents to the ED with complaints of abdominal pain. Patient nontoxic appearing, in no apparent distress, vitals WNL. On exam patient tender to RLQ and suprapubic area no peritoneal signs. Will evaluate with labs and CT w/o contrast because of allergy. Analgesics and anti-emetics administered.   Labs reviewed and grossly unremarkable. No leukocytosis, no anemia, no significant electrolyte derangements. Renal function normal and lipase WNL. Urinalysis without obvious infection. Slight bump in ALT is 50, has history of similar. Pregnancy test is negative.  CT AP shows no appendicitis. Does show moderate fluid in the cul-de-sac, possible recent ovarian cyst rupture. No adnexal masses evident by CT.  US performed and is negative for any acute findings. No ovarian torsion, no masses seen.  Discussed with patient a recent cyst rupture could be the cause of her pain today.  She declines need for pelvic exam as she is not concerned for STIs.  I feel this is reasonable.  Patient given a dose of Toradol.  On reassessment she is tolerating p.o. intake. Serial abdominal exam patient remains without peritoneal signs, no findings to suggest acute surgical abdomen. Will discharge home with supportive measures. I discussed results, treatment plan, need for PCP follow-up, and return precautions with the patient. Provided opportunity for questions, patient confirmed understanding and is in agreement with plan.    Portions of this note were generated with Scientist, clinical (histocompatibility and immunogenetics)Dragon dictation software. Dictation errors may occur despite best attempts at proofreading.   Final Clinical Impression(s) / ED Diagnoses Final diagnoses:  Abdominal pain, unspecified abdominal location    Rx / DC Orders ED Discharge Orders         Ordered    ondansetron (ZOFRAN ODT) 4 MG disintegrating tablet  Every 8 hours PRN        04/07/20 1748    naproxen (NAPROSYN) 375 MG tablet  2 times daily with meals        04/07/20  [address]           Kathyrn Lass 04/07/20 1909    Vanetta Mulders, MD 04/07/20 2212

## 2020-04-07 NOTE — ED Notes (Signed)
Pt given water for fluid challenge 

## 2020-04-07 NOTE — ED Triage Notes (Addendum)
Reports lower abdominal and lower back pain since this morning. Denies N/V/D, endorses minor discomfort in abdomen with urination. Endorses 'mucus-like' discharge. Denies vaginal itching, pain or odors. No new sexual partners. Used miralax at home, had a BM, but pain persisted.

## 2020-04-07 NOTE — Discharge Instructions (Addendum)
CT scan today showed you have what looks to the a ruptured ovarian cyst. There is no antibiotics needed for this. You should take Naproxen as needed to help with your pain. Take with food so it does not upset your stomach.  Prescription for zofran for nausea as needed.  Do you best to stay well hydrated and drink plenty of fluids

## 2020-04-10 ENCOUNTER — Encounter: Payer: Self-pay | Admitting: Physical Therapy

## 2020-04-10 ENCOUNTER — Other Ambulatory Visit: Payer: Self-pay

## 2020-04-10 ENCOUNTER — Encounter: Payer: BC Managed Care – PPO | Admitting: Physical Therapy

## 2020-04-10 ENCOUNTER — Encounter (HOSPITAL_COMMUNITY): Payer: Self-pay

## 2020-04-10 ENCOUNTER — Emergency Department (HOSPITAL_COMMUNITY)
Admission: EM | Admit: 2020-04-10 | Discharge: 2020-04-10 | Disposition: A | Discharge status: Home / Self Care | Payer: BC Managed Care – PPO | Source: Home / Self Care | Attending: Emergency Medicine | Admitting: Emergency Medicine

## 2020-04-10 DIAGNOSIS — K766 Portal hypertension: Secondary | ICD-10-CM | POA: Diagnosis not present

## 2020-04-10 DIAGNOSIS — K760 Fatty (change of) liver, not elsewhere classified: Secondary | ICD-10-CM | POA: Diagnosis not present

## 2020-04-10 DIAGNOSIS — B1910 Unspecified viral hepatitis B without hepatic coma: Secondary | ICD-10-CM | POA: Diagnosis not present

## 2020-04-10 DIAGNOSIS — R339 Retention of urine, unspecified: Secondary | ICD-10-CM | POA: Diagnosis not present

## 2020-04-10 DIAGNOSIS — R102 Pelvic and perineal pain: Secondary | ICD-10-CM | POA: Diagnosis not present

## 2020-04-10 DIAGNOSIS — Z23 Encounter for immunization: Secondary | ICD-10-CM | POA: Diagnosis not present

## 2020-04-10 DIAGNOSIS — E119 Type 2 diabetes mellitus without complications: Secondary | ICD-10-CM | POA: Insufficient documentation

## 2020-04-10 DIAGNOSIS — E869 Volume depletion, unspecified: Secondary | ICD-10-CM | POA: Diagnosis not present

## 2020-04-10 DIAGNOSIS — G47419 Narcolepsy without cataplexy: Secondary | ICD-10-CM | POA: Diagnosis not present

## 2020-04-10 DIAGNOSIS — Z87891 Personal history of nicotine dependence: Secondary | ICD-10-CM | POA: Insufficient documentation

## 2020-04-10 DIAGNOSIS — J90 Pleural effusion, not elsewhere classified: Secondary | ICD-10-CM | POA: Diagnosis not present

## 2020-04-10 DIAGNOSIS — R1013 Epigastric pain: Secondary | ICD-10-CM | POA: Diagnosis not present

## 2020-04-10 DIAGNOSIS — B160 Acute hepatitis B with delta-agent with hepatic coma: Secondary | ICD-10-CM | POA: Diagnosis not present

## 2020-04-10 DIAGNOSIS — R109 Unspecified abdominal pain: Secondary | ICD-10-CM | POA: Diagnosis not present

## 2020-04-10 DIAGNOSIS — N320 Bladder-neck obstruction: Secondary | ICD-10-CM | POA: Diagnosis not present

## 2020-04-10 DIAGNOSIS — I5031 Acute diastolic (congestive) heart failure: Secondary | ICD-10-CM | POA: Diagnosis not present

## 2020-04-10 DIAGNOSIS — R1084 Generalized abdominal pain: Secondary | ICD-10-CM | POA: Diagnosis not present

## 2020-04-10 DIAGNOSIS — K7401 Hepatic fibrosis, early fibrosis: Secondary | ICD-10-CM | POA: Diagnosis not present

## 2020-04-10 DIAGNOSIS — F1010 Alcohol abuse, uncomplicated: Secondary | ICD-10-CM | POA: Diagnosis not present

## 2020-04-10 DIAGNOSIS — R338 Other retention of urine: Secondary | ICD-10-CM | POA: Diagnosis not present

## 2020-04-10 DIAGNOSIS — N83209 Unspecified ovarian cyst, unspecified side: Secondary | ICD-10-CM | POA: Insufficient documentation

## 2020-04-10 DIAGNOSIS — Z8632 Personal history of gestational diabetes: Secondary | ICD-10-CM | POA: Diagnosis not present

## 2020-04-10 DIAGNOSIS — T39395A Adverse effect of other nonsteroidal anti-inflammatory drugs [NSAID], initial encounter: Secondary | ICD-10-CM | POA: Diagnosis not present

## 2020-04-10 DIAGNOSIS — R188 Other ascites: Secondary | ICD-10-CM | POA: Diagnosis not present

## 2020-04-10 DIAGNOSIS — R0602 Shortness of breath: Secondary | ICD-10-CM | POA: Diagnosis not present

## 2020-04-10 DIAGNOSIS — B181 Chronic viral hepatitis B without delta-agent: Secondary | ICD-10-CM | POA: Diagnosis not present

## 2020-04-10 DIAGNOSIS — N170 Acute kidney failure with tubular necrosis: Secondary | ICD-10-CM | POA: Diagnosis not present

## 2020-04-10 DIAGNOSIS — E871 Hypo-osmolality and hyponatremia: Secondary | ICD-10-CM | POA: Diagnosis not present

## 2020-04-10 DIAGNOSIS — N179 Acute kidney failure, unspecified: Secondary | ICD-10-CM | POA: Diagnosis not present

## 2020-04-10 DIAGNOSIS — Z9151 Personal history of suicidal behavior: Secondary | ICD-10-CM | POA: Diagnosis not present

## 2020-04-10 DIAGNOSIS — N83299 Other ovarian cyst, unspecified side: Secondary | ICD-10-CM | POA: Diagnosis not present

## 2020-04-10 DIAGNOSIS — B188 Other chronic viral hepatitis: Secondary | ICD-10-CM | POA: Diagnosis not present

## 2020-04-10 DIAGNOSIS — K7011 Alcoholic hepatitis with ascites: Secondary | ICD-10-CM | POA: Diagnosis not present

## 2020-04-10 DIAGNOSIS — R1010 Upper abdominal pain, unspecified: Secondary | ICD-10-CM | POA: Diagnosis not present

## 2020-04-10 DIAGNOSIS — Z20822 Contact with and (suspected) exposure to covid-19: Secondary | ICD-10-CM | POA: Diagnosis not present

## 2020-04-10 DIAGNOSIS — K739 Chronic hepatitis, unspecified: Secondary | ICD-10-CM | POA: Diagnosis not present

## 2020-04-10 DIAGNOSIS — Z91041 Radiographic dye allergy status: Secondary | ICD-10-CM | POA: Diagnosis not present

## 2020-04-10 DIAGNOSIS — E860 Dehydration: Secondary | ICD-10-CM | POA: Diagnosis not present

## 2020-04-10 LAB — LIPASE, BLOOD: Lipase: 32 U/L (ref 11–51)

## 2020-04-10 LAB — CBC
HCT: 42.5 % (ref 36.0–46.0)
Hemoglobin: 14.1 g/dL (ref 12.0–15.0)
MCH: 30.9 pg (ref 26.0–34.0)
MCHC: 33.2 g/dL (ref 30.0–36.0)
MCV: 93.2 fL (ref 80.0–100.0)
Platelets: 229 10*3/uL (ref 150–400)
RBC: 4.56 MIL/uL (ref 3.87–5.11)
RDW: 12.2 % (ref 11.5–15.5)
WBC: 5.8 10*3/uL (ref 4.0–10.5)
nRBC: 0 % (ref 0.0–0.2)

## 2020-04-10 LAB — I-STAT BETA HCG BLOOD, ED (MC, WL, AP ONLY): I-stat hCG, quantitative: <5 m[IU]/mL (ref <5)

## 2020-04-10 LAB — COMPREHENSIVE METABOLIC PANEL
ALT: 37 U/L (ref 0–44)
AST: 24 U/L (ref 15–41)
Albumin: 4.4 g/dL (ref 3.5–5.0)
Alkaline Phosphatase: 47 U/L (ref 38–126)
Anion gap: 10 (ref 5–15)
BUN: 15 mg/dL (ref 6–20)
CO2: 26 mmol/L (ref 22–32)
Calcium: 9.5 mg/dL (ref 8.9–10.3)
Chloride: 103 mmol/L (ref 98–111)
Creatinine, Ser: 0.84 mg/dL (ref 0.44–1.00)
GFR, Estimated: >60 mL/min (ref >60)
Glucose, Bld: 133 mg/dL — ABNORMAL HIGH (ref 70–99)
Potassium: 4.4 mmol/L (ref 3.5–5.1)
Sodium: 139 mmol/L (ref 135–145)
Total Bilirubin: 1 mg/dL (ref 0.3–1.2)
Total Protein: 8.2 g/dL — ABNORMAL HIGH (ref 6.5–8.1)

## 2020-04-10 LAB — URINALYSIS, ROUTINE W REFLEX MICROSCOPIC
Bilirubin Urine: NEGATIVE
Glucose, UA: NEGATIVE mg/dL
Hgb urine dipstick: NEGATIVE
Ketones, ur: NEGATIVE mg/dL
Leukocytes,Ua: NEGATIVE
Nitrite: NEGATIVE
Protein, ur: NEGATIVE mg/dL
Specific Gravity, Urine: 1.006 (ref 1.005–1.030)
pH: 6 (ref 5.0–8.0)

## 2020-04-10 MED ORDER — MORPHINE SULFATE (PF) 4 MG/ML IV SOLN
4.0000 mg | Freq: Once | INTRAVENOUS | Status: AC
  Administered 2020-04-10: 4 mg via INTRAVENOUS
  Filled 2020-04-10: qty 1

## 2020-04-10 MED ORDER — HYDROCODONE-ACETAMINOPHEN 5-325 MG PO TABS
1.0000 | ORAL_TABLET | Freq: Four times a day (QID) | ORAL | 0 refills | Status: DC | PRN
Start: 2020-04-10 — End: 2020-04-20

## 2020-04-10 MED ORDER — KETOROLAC TROMETHAMINE 30 MG/ML IJ SOLN
30.0000 mg | Freq: Once | INTRAMUSCULAR | Status: AC
  Administered 2020-04-10: 30 mg via INTRAVENOUS
  Filled 2020-04-10: qty 1

## 2020-04-10 NOTE — ED Provider Notes (Signed)
[facility]-EMERGENCY DEPT Provider Note   CSN: [id number] Arrival date & time: 04/10/20  1031     History Chief Complaint  Patient presents with  . Abdominal Pain  . Back Pain    Kim Pacheco is a 34 y.o. female.  HPI   Pt started having pain a few days ago.  She was seen in the emergency room and had CT scans as well as ultrasounds.  She was diagnosed with a ruptured ovarian cyst.  Pt has been taking her medicaitons. Today however the pain moved up towards her upper abdomen.  She also started having pain going into her back down her toes.    No vomiting.  No fever.  No dysuria.  The pain does increase with urination though.  Past Medical History:  Diagnosis Date  . Abdominal pain    started in mid abd and spread to left side/on and off/since Jan 2020/ pt states, she has fluid in abd/unknown  . Abnormal Pap smear    normal 06/2013  . Anxiety   . Constipation    hx of  . Depression    suicide attempt in college  . Diabetes mellitus without complication (HCC)    gestational, no issues since pregnancy per her report - resolved  . Gestational diabetes    /no meds now - resolved  . Hepatitis    Hep B, managed by Dr. Kinnie Scales  . History of chicken pox   . History of UTI   . Vaginal Pap smear, abnormal     Patient Active Problem List   Diagnosis Date Noted  . Narcolepsy without cataplexy 02/02/2020  . Depression, major, single episode, moderate (HCC) 10/28/2018  . ARF (acute renal failure) (HCC) 06/25/2018  . Alcohol abuse 06/24/2018  . Constipation 06/24/2018  . Ascites 06/24/2018  . Body mass index (BMI) of 25.0 to 29.9 05/29/2018  . Fibrocystic disease of breast 05/29/2018  . Metrorrhagia 05/29/2018  . Urinary tract infectious disease 05/29/2018  . Oral hypoglycemic controlled White classification A2 gestational diabetes mellitus (GDM) 11/18/2017  . Previous cesarean delivery, delivered: arrest of dilation  11/18/2017  . Abnormal  glucose tolerance test (GTT) during pregnancy, antepartum 09/17/2017  . Hepatitis B 06/14/2014    Past Surgical History:  Procedure Laterality Date  . CESAREAN SECTION N/A 01/26/2013   Procedure: Primary Cesarean Section Delivery Baby  Boy @ 0017, Apgars 4/8/9;  Surgeon: Freddrick March. Tenny Craw, MD;  Location: [facility];  Service: Obstetrics;  Laterality: N/A;  . CESAREAN SECTION N/A 11/18/2017   Procedure: CESAREAN SECTION;  Surgeon: Olivia Mackie, MD;  Location: [facility]IRTHING SU[facility];  Service: Obstetrics;  Laterality: N/A;  . COLONOSCOPY  2020  . ESOPHAGOGASTRODUODENOSCOPY ENDOSCOPY    . laproscopy  2020  . NASAL SINUS SURGERY  1996  . WISDOM TOOTH EXTRACTION       OB History    Gravida  7   Para  2   Term  2   Preterm      AB  5   Living  2     SAB  0   TAB  5   Ectopic      Multiple  0   Live Births  2           Family History  Adopted: Yes    Social History   Tobacco Use  . Smoking status: Former Smoker    Packs/day: 1.00    Types: Cigarettes    Quit date: 07/23/2011  Years since quitting: 8.7  . Smokeless tobacco: Never Used  . Tobacco comment: smoked for 8 years, quit in 2013  Vaping Use  . Vaping Use: Never used  Substance Use Topics  . Alcohol use: Yes    Alcohol/week: 5.0 standard drinks    Types: 5 Glasses of wine per week  . Drug use: Not Currently    Types: Marijuana    Comment: Last use 12/19/18    Home Medications Prior to Admission medications   Medication Sig Start Date End Date Taking? Authorizing Provider  HYDROcodone-acetaminophen (NORCO/VICODIN) 5-325 MG tablet Take 1 tablet by mouth every 6 (six) hours as needed. 04/10/20   Linwood Dibbles, MD  naproxen (NAPROSYN) 375 MG tablet Take 1 tablet (375 mg total) by mouth 2 (two) times daily with a meal for 7 days. 04/07/20 04/14/20  Albrizze, Kaitlyn E, PA-C  ondansetron (ZOFRAN ODT) 4 MG disintegrating tablet Take 1 tablet (4 mg total) by mouth every 8 (eight) hours as needed for nausea or  vomiting. 04/07/20   Albrizze, Kaitlyn E, PA-C    Allergies    Contrast media [iodinated diagnostic agents]  Review of Systems   Review of Systems  All other systems reviewed and are negative.   Physical Exam Updated Vital Signs BP 131/85   Pulse 68   Temp 98.4 F (36.9 C) (Oral)   Resp 18   Ht 1.524 m (5')   Wt 59 kg   LMP 03/14/2020   SpO2 99%   BMI 25.39 kg/m   Physical Exam Vitals and nursing note reviewed.  Constitutional:      General: She is not in acute distress.    Appearance: She is well-developed.  HENT:     Head: Normocephalic and atraumatic.     Right Ear: External ear normal.     Left Ear: External ear normal.  Eyes:     General: No scleral icterus.       Right eye: No discharge.        Left eye: No discharge.     Conjunctiva/sclera: Conjunctivae normal.  Neck:     Trachea: No tracheal deviation.  Cardiovascular:     Rate and Rhythm: Normal rate and regular rhythm.  Pulmonary:     Effort: Pulmonary effort is normal. No respiratory distress.     Breath sounds: Normal breath sounds. No stridor. No wheezing or rales.  Abdominal:     General: Bowel sounds are normal. There is no distension.     Palpations: Abdomen is soft.     Tenderness: There is generalized abdominal tenderness. There is no guarding or rebound.  Musculoskeletal:        General: No tenderness.     Cervical back: Neck supple.  Skin:    General: Skin is warm and dry.     Findings: No rash.  Neurological:     Mental Status: She is alert.     Cranial Nerves: No cranial nerve deficit (no facial droop, extraocular movements intact, no slurred speech).     Sensory: No sensory deficit.     Motor: No abnormal muscle tone or seizure activity.     Coordination: Coordination normal.     ED Results / Procedures / Treatments   Labs (all labs ordered are listed, but only abnormal results are displayed) Labs Reviewed  COMPREHENSIVE METABOLIC PANEL - Abnormal; Notable for the following  components:      Result Value   Glucose, Bld 133 (*)    Total Protein 8.2 (*)  All other components within normal limits  URINALYSIS, ROUTINE W REFLEX MICROSCOPIC - Abnormal; Notable for the following components:   Color, Urine STRAW (*)    All other components within normal limits  LIPASE, BLOOD  CBC  I-STAT BETA HCG BLOOD, ED ([facility], [facility], [facility] ONLY)    EKG None  Radiology No results found.  Procedures Procedures (including critical care time)  Medications Ordered in ED Medications  ketorolac (TORADOL) 30 MG/ML injection 30 mg (30 mg Intravenous Given 04/10/20 1309)  morphine 4 MG/ML injection 4 mg (4 mg Intravenous Given 04/10/20 1309)    ED Course  I have reviewed the triage vital signs and the nursing notes.  Pertinent labs & imaging results that were available during my care of the patient were reviewed by me and considered in my medical decision making (see chart for details).  Clinical Course as of Apr 11 1507  [facility] Apr 10, 2020  1244 Labs reviewed. All normal.   [JK]    Clinical Course User Index [JK] Linwood Dibbles, MD   MDM Rules/Calculators/A&P                          Patient presented to the ED for evaluation of persistent abdominal pain.  Patient was just seen in the ED 2 days ago.  During that visit the patient had laboratory test.  She had a CT scan that did not show any ureteral stone.  No abscess or abnormality in the bowel or appendix.  She did have moderate fluid in the cul-de-sac.  Patient ended up having pelvic ultrasound including Doppler studies.  Patient did not have evidence ovarian torsion or any acute abnormality.  Patient was treated for an ovarian cyst.  Patient is having persistent pain.  Her laboratory tests are reassuring.  She does not have a leukocytosis.  I doubt any acute infection.  Patient had an extensive evaluation just 2 days ago.  I do not think she requires any abdominal imaging.  I suspect she is having some persistent pain associated with  her recent ruptured ovarian cyst.  Will discharge home with a short course of hydrocodone.  Patient can continue her Naprosyn Final Clinical Impression(s) / ED Diagnoses Final diagnoses:  Abdominal pain, unspecified abdominal location  Ruptured ovarian cyst    Rx / DC Orders ED Discharge Orders         Ordered    HYDROcodone-acetaminophen (NORCO/VICODIN) 5-325 MG tablet  Every 6 hours PRN        04/10/20 1507           Linwood Dibbles, MD 04/10/20 [phone]

## 2020-04-10 NOTE — ED Notes (Signed)
Pt has spoken to Dr. Lynelle Doctor and is ready for discharge. An After Visit Summary was printed and given to the patient. Discharge instructions given and no further questions at this time.

## 2020-04-10 NOTE — ED Triage Notes (Addendum)
Patient reports that she was seen 3 days ago and was told she had a ruptured ovarian cyst.  Today, the the patient c/o bilateral abdominal and lower pain that radiates into her back and the pain goes all the way to her toes since yesterday. Patient states the pain is worse when she urinates. Patient denies any N/V/D.

## 2020-04-10 NOTE — ED Notes (Signed)
AVS and discharge instructions given to patient, patient asking to speak to provider regarding a CT scan. Dr. Lynelle Doctor aware.

## 2020-04-10 NOTE — Discharge Instructions (Addendum)
Take the pain medications you were previously prescribed.  You can take the hydrocodone for more severe pain.  Follow-up with an OB/GYN doctor as we discussed.

## 2020-04-10 NOTE — Therapy (Addendum)
[facility] [address] [place], [place], [address] Phone: [phone]   Fax:  [phone]  Physical Therapy Evaluation  Patient Details  Name: NORINNE JEANE MRN: [id number] Date of Birth: 24-May-1986 Referring Provider (PT): Lynne Leader   Encounter Date: 04/06/2020   PT End of Session - 04/10/20 1224    Visit Number 1    Number of Visits 12    Date for PT Re-Evaluation 05/18/20    Authorization Type BC[employer]    PT Start Time 0845    PT Stop Time 0930    PT Time Calculation (min) 45 min    Activity Tolerance Patient tolerated treatment well    Behavior During Therapy [facility] for tasks assessed/performed           Past Medical History:  Diagnosis Date  . Abdominal pain    started in mid abd and spread to left side/on and off/since Jan 2020/ pt states, she has fluid in abd/unknown  . Abnormal Pap smear    normal 06/2013  . Anxiety   . Constipation    hx of  . Depression    suicide attempt in college  . Diabetes mellitus without complication (HCC)    gestational, no issues since pregnancy per her report - resolved  . Gestational diabetes    /no meds now - resolved  . Hepatitis    Hep B, managed by Dr. Earlean Shawl  . History of chicken pox   . History of UTI   . Vaginal Pap smear, abnormal     Past Surgical History:  Procedure Laterality Date  . CESAREAN SECTION N/A 01/26/2013   Procedure: Primary Cesarean Section Delivery Baby  Boy @ 0017, Apgars 4/8/9;  Surgeon: Farrel Gobble. Harrington Challenger, MD;  Location: [facility];  Service: Obstetrics;  Laterality: N/A;  . CESAREAN SECTION N/A 11/18/2017   Procedure: CESAREAN SECTION;  Surgeon: Brien Few, MD;  Location: [facility];  Service: Obstetrics;  Laterality: N/A;  . COLONOSCOPY  2020  . ESOPHAGOGASTRODUODENOSCOPY ENDOSCOPY    . laproscopy  2020  . NASAL SINUS SURGERY  1996  . WISDOM TOOTH EXTRACTION      There were no vitals filed for this visit.    Subjective Assessment -  04/10/20 1221    Subjective Pt states Pain in R ankle, achilles and also med/lat ankle. No previous pain. She does work as  Psychologist, occupational, Does wear good shoes to work, but stands for long hours. Some days has no pain, but increased pain with longer standing/walking. Wearing flip flops today    Limitations Standing;Walking    Patient Stated Goals decreased pain    Currently in Pain? Yes    Pain Score 5     Pain Location Ankle    Pain Orientation Right    Pain Descriptors / Indicators Aching    Pain Type Acute pain    Pain Onset More than a month ago    Pain Frequency Intermittent    Effect of Pain on Daily Activities pain with standing 4/10 , can go up to 8/10 with end of day/working              [facility] PT Assessment - 04/10/20 0001      Assessment   Medical Diagnosis Achilles/peroneal tendonitis    Referring Provider (PT) Ellard Artis corey    Prior Therapy no      Balance Screen   Has the patient fallen in the past 6 months No  Prior Function   Level of Independence Independent      Cognition   Overall Cognitive Status Within Functional Limits for tasks assessed      Posture/Postural Control   Posture Comments Foot posture: wide forefoot, wide heel, good arch height in standing ,       AROM   Overall AROM Comments mild limitation for DF on R, hips; knees: WFL       Strength   Overall Strength Comments Ankle: 4/5       Palpation   Palpation comment Pain at distal achilles central and medial, mild soreness in peroneal      Ambulation/Gait   Gait Comments increased med/lat heel motion with initial contact to mid stance, (barefoot)                       Objective measurements completed on examination: See above findings.       [facility] Adult PT Treatment/Exercise - 04/10/20 0001      Exercises   Exercises Ankle      Modalities   Modalities Iontophoresis      Iontophoresis   Type of Iontophoresis Dexamethasone    Location R distal achilles     Time 4 hr patch      Manual Therapy   Manual Therapy Joint mobilization;Soft tissue mobilization    Joint Mobilization to improve DF    Soft tissue mobilization DTM/IASTM to R gastroc and achilles       Ankle Exercises: Standing   Heel Raises 20 reps    Heel Raises Limitations Eccentric                     PT Short Term Goals - 04/10/20 1228      PT SHORT TERM GOAL #1   Title Pt to be independent with initial HEP    Time 2    Period Weeks    Status New    Target Date 04/20/20             PT Long Term Goals - 04/10/20 1229      PT LONG TERM GOAL #1   Title Pt to be indepednent with final HEP    Time 6    Period Weeks    Status New    Target Date 05/18/20      PT LONG TERM GOAL #2   Title Pt to report decreased pain in R foot/ankle to 0-1/10 with standing and walking    Time 6    Period Weeks    Status New    Target Date 05/18/20      PT LONG TERM GOAL #3   Title Pt to demo ability for standing/ambulation up to 2 hours with no pain in foot, to improve ability for work duties    Time 6    Period Weeks    Status New    Target Date 05/18/20      PT LONG TERM GOAL #4   Title Pt to be compliant with footwear and/or orthotics as appropriate for her foot type, to improve foot position and pain.    Time 6    Period Weeks    Status New    Target Date 05/18/20                  Plan - 04/10/20 1238    Clinical Impression Statement Pt presents with primary complaint of increased pain in R ankle. Pt with most pain  at distal achilles today. She will benefit from more stable shoe and heel counter, for decreased med/lat heel motion with gait. Pt will benefit from education on footwear and/or orthotics, as well as HEP. Pt to benefit from skilled PT to imrpove pain and defiicts.    Personal Factors and Comorbidities Profession    Examination-Activity Limitations Stairs;Stand    Examination-Participation Restrictions Community Activity;Occupation     Stability/Clinical Decision Making Stable/Uncomplicated    Clinical Decision Making Low    Rehab Potential Good    PT Frequency 2x / week    PT Duration 6 weeks    PT Treatment/Interventions ADLs/Self Care Home Management;Electrical Stimulation;Gait training;Cryotherapy;Iontophoresis 29m/ml Dexamethasone;Moist Heat;Ultrasound;DME Instruction;Neuromuscular re-education;Balance training;Therapeutic exercise;Therapeutic activities;Functional mobility training;Stair training;Patient/family education;Orthotic Fit/Training;Manual techniques;Taping;Dry needling;Passive range of motion;Vasopneumatic Device;Spinal Manipulations;Joint Manipulations    Consulted and Agree with Plan of Care Patient           Patient will benefit from skilled therapeutic intervention in order to improve the following deficits and impairments:  Abnormal gait, Decreased range of motion, Increased muscle spasms, Decreased activity tolerance, Pain, Decreased balance, Impaired flexibility, Improper body mechanics, Decreased strength, Decreased mobility  Visit Diagnosis: Pain in right ankle and joints of right foot     Problem List Patient Active Problem List   Diagnosis Date Noted  . Narcolepsy without cataplexy 02/02/2020  . Depression, major, single episode, moderate ([place] 10/28/2018  . [place] 06/25[place]  . Alcohol abuse 06/24/2018  . Constipation 06/24/2018  . Ascites 06/24/2018  . Body mass index (BMI) of 25.0 to 29.9 05/29/2018  . Fibrocystic disease of breast 05/29/2018  . Metrorrhagia 05/29/2018  . Urinary tract infectious disease 05/29/2018  . Oral hypoglycemic controlled White classification A2 gestational diabetes mellitus (GDM) 11/18/2017  . Previous cesarean delivery, delivered: arrest of dilation  11/18/2017  . Abnormal glucose tolerance test (GTT) during pregnancy, antepartum 09/17/2017  . Hepatitis B 06/14/2014    LLyndee Hensen PT, DPT 12:43 PM  04/10/20    [facility]  [place] [place] [address]Phone: [phone]  Fax:  [phone] Name: MBRYN SALINEMRN: [id number] of Birth: [birth year]  PHYSICAL THERAPY DISCHARGE SUMMARY  Visits from Start of Care:1 Plan: Patient agrees to discharge.  Patient goals were not met. Patient is being discharged due to not returning since the last visit.  ?????     LLyndee Hensen PT, DPT 1:33 PM  10/03/20

## 2020-04-11 ENCOUNTER — Encounter: Payer: Self-pay | Admitting: Family Medicine

## 2020-04-12 ENCOUNTER — Ambulatory Visit (INDEPENDENT_AMBULATORY_CARE_PROVIDER_SITE_OTHER): Payer: BC Managed Care – PPO

## 2020-04-12 ENCOUNTER — Other Ambulatory Visit: Payer: Self-pay

## 2020-04-12 ENCOUNTER — Encounter: Payer: Self-pay | Admitting: Family Medicine

## 2020-04-12 ENCOUNTER — Ambulatory Visit: Payer: BC Managed Care – PPO | Admitting: Family Medicine

## 2020-04-12 VITALS — BP 100/68 | HR 60 | Temp 98.1°F | Ht 60.0 in | Wt 134.1 lb

## 2020-04-12 DIAGNOSIS — R1084 Generalized abdominal pain: Secondary | ICD-10-CM

## 2020-04-12 DIAGNOSIS — R109 Unspecified abdominal pain: Secondary | ICD-10-CM | POA: Diagnosis not present

## 2020-04-12 MED ORDER — PEG 3350-KCL-NABCB-NACL-NASULF 236 G PO SOLR: ORAL | 0 refills | Status: DC

## 2020-04-12 MED ORDER — KETOROLAC TROMETHAMINE 60 MG/2ML IM SOLN
60.0000 mg | Freq: Once | INTRAMUSCULAR | Status: AC
  Administered 2020-04-12: 60 mg via INTRAMUSCULAR

## 2020-04-12 MED ORDER — DOCUSATE SODIUM 100 MG PO CAPS: 100.0000 mg | ORAL_CAPSULE | Freq: Two times a day (BID) | ORAL | 0 refills | Status: DC

## 2020-04-12 MED ORDER — ONDANSETRON 4 MG PO TBDP: 4.0000 mg | ORAL_TABLET | Freq: Three times a day (TID) | ORAL | 0 refills | Status: DC | PRN

## 2020-04-12 MED ORDER — ONDANSETRON HCL 4 MG/2ML IJ SOLN
4.0000 mg | Freq: Once | INTRAMUSCULAR | Status: AC
  Administered 2020-04-12: 4 mg via INTRAMUSCULAR

## 2020-04-12 NOTE — Patient Instructions (Signed)
Make sure you are keeping up with fluids. Take the zofran every 8 hours for next couple of days in order to make sure you can keep up with fluid intake. I would suggest trying to drink 6-8 ounces of fluid after each golytely dose. You can stop taking the golytely when stools are becoming less formed. I would suggest you continue with the miralax on a regular basis to help prevent constipation in the future. If pain worsens, please return to ER.   Constipation, Adult Constipation is when a person has fewer bowel movements in a week than normal, has difficulty having a bowel movement, or has stools that are dry, hard, or larger than normal. Constipation may be caused by an underlying condition. It may become worse with age if a person takes certain medicines and does not take in enough fluids. Follow these instructions at home: Eating and drinking   Eat foods that have a lot of fiber, such as fresh fruits and vegetables, whole grains, and beans.  Limit foods that are high in fat, low in fiber, or overly processed, such as french fries, hamburgers, cookies, candies, and soda.  Drink enough fluid to keep your urine clear or pale yellow. General instructions  Exercise regularly or as told by your health care provider.  Go to the restroom when you have the urge to go. Do not hold it in.  Take over-the-counter and prescription medicines only as told by your health care provider. These include any fiber supplements.  Practice pelvic floor retraining exercises, such as deep breathing while relaxing the lower abdomen and pelvic floor relaxation during bowel movements.  Watch your condition for any changes.  Keep all follow-up visits as told by your health care provider. This is important. Contact a health care provider if:  You have pain that gets worse.  You have a fever.  You do not have a bowel movement after 4 days.  You vomit.  You are not hungry.  You lose weight.  You are  bleeding from the anus.  You have thin, pencil-like stools. Get help right away if:  You have a fever and your symptoms suddenly get worse.  You leak stool or have blood in your stool.  Your abdomen is bloated.  You have severe pain in your abdomen.  You feel dizzy or you faint. This information is not intended to replace advice given to you by your health care provider. Make sure you discuss any questions you have with your health care provider. Document Revised: 05/09/2017 Document Reviewed: 11/15/2015 Elsevier Patient Education  2020 [employer].

## 2020-04-12 NOTE — Progress Notes (Signed)
Masey Scheiber Sollecito-Olson DOB: 04/03/1986 Encounter date: 04/12/2020  This is a 34 y.o. female who presents with Chief Complaint  Patient presents with  . Abdominal Pain   (was supposed to be here for preventative care, but having significant abd pain)  History of present illness: Still having a lot of abdomen pain. Not sure if constipated. Had miralax before coming here, but having a lot of pain and tenderness around mid abd. Comes and goes, but when it seems better she gets worse again. No fevers, but feels hot, sweaty.   Last bowel movement was Monday - this one was normal - larger, solid. Urinates when she can, but hurts when she goes. Hurts in lower pelvis when she urinates. Pain came on suddenly on Friday. Just woke up and went to use bathroom. More issues with constipation. Back hurts when there is severe pain.   Has had nausea; vomiting only after getting morphine in the ER.   She does rate pain is 8-9 out of 10, especially with lying down, but pain is down to 3 once she is able to stand for period of time.  Last period was 1 month ago.  She does have very regular periods and is due to start tomorrow.  Allergies  Allergen Reactions  . Contrast Media [Iodinated Diagnostic Agents] Anaphylaxis, Shortness Of Breath and Rash    IV contrast    Current Meds  Medication Sig  . HYDROcodone-acetaminophen (NORCO/VICODIN) 5-325 MG tablet Take 1 tablet by mouth every 6 (six) hours as needed. (Patient taking differently: Take 1 tablet by mouth every 6 (six) hours as needed for moderate pain or severe pain. )  . naproxen (NAPROSYN) 375 MG tablet Take 1 tablet (375 mg total) by mouth 2 (two) times daily with a meal for 7 days. (Patient taking differently: Take 375 mg by mouth 2 (two) times daily as needed for moderate pain. )  . ondansetron (ZOFRAN ODT) 4 MG disintegrating tablet Take 1 tablet (4 mg total) by mouth every 8 (eight) hours as needed for nausea or vomiting.  . [DISCONTINUED]  ondansetron (ZOFRAN ODT) 4 MG disintegrating tablet Take 1 tablet (4 mg total) by mouth every 8 (eight) hours as needed for nausea or vomiting.    Review of Systems  Constitutional: Negative for chills, fatigue and fever.  Respiratory: Negative for cough, chest tightness, shortness of breath and wheezing.   Cardiovascular: Negative for chest pain, palpitations and leg swelling.  Gastrointestinal: Positive for abdominal pain, constipation, nausea (minimal) and vomiting (one episode after morphine in ER). Negative for abdominal distention, blood in stool and diarrhea.  Genitourinary: Negative for difficulty urinating and dysuria.  Musculoskeletal: Negative for back pain.    Objective:  BP 100/68 (BP Location: Left Arm, Patient Position: Sitting, Cuff Size: Normal)   Pulse 60   Temp 98.1 F (36.7 C) (Oral)   Ht 5' (1.524 m)   Wt 134 lb 1.6 oz (60.8 kg)   LMP 03/14/2020   BMI 26.19 kg/m   Weight: 134 lb 1.6 oz (60.8 kg)   BP Readings from Last 3 Encounters:  04/14/20 119/84  04/12/20 100/68  04/10/20 129/86   Wt Readings from Last 3 Encounters:  04/12/20 134 lb 1.6 oz (60.8 kg)  04/10/20 130 lb (59 kg)  04/07/20 136 lb 11 oz (62 kg)    Physical Exam Constitutional:      General: She is not in acute distress.    Appearance: She is well-developed.  Cardiovascular:     Rate  and Rhythm: Normal rate and regular rhythm.     Heart sounds: Normal heart sounds. No murmur heard.  No friction rub.  Pulmonary:     Effort: Pulmonary effort is normal. No respiratory distress.     Breath sounds: Normal breath sounds. No wheezing or rales.  Abdominal:     General: Abdomen is protuberant.     Palpations: There is no fluid wave.     Tenderness: There is abdominal tenderness. There is no right CVA tenderness or left CVA tenderness.     Comments: She is much more comfortable standing. Pain significantly worsens esp with lying down. She has diffuse tenderness, and cannot tolerate abd exam  well; but there is no specific area of guarding on palpation. Standing exam still reveals tenderness diffusely with palpation.   Genitourinary:    Rectum: Normal. Guaiac result negative. No mass, tenderness or external hemorrhoid.  Musculoskeletal:     Right lower leg: No edema.     Left lower leg: No edema.  Neurological:     Mental Status: She is alert and oriented to person, place, and time.  Psychiatric:        Behavior: Behavior normal.     Assessment/Plan  1. Generalized abdominal pain She has had evaluation with ultrasound as well as CT (although contrast was not used) and had 2 emergency room visits for her abdominal pain.  It does come and go somewhat.  X-ray done in the office does show a moderate stool burden.  She does tend to be constipated and after discussion and review of the ER notes, imaging results, blood work, we decided to work on treating constipation to see if abdominal pain can improve.  I did check to see if I could get her in for a CT of abdomen and pelvis with contrast today, but there were no openings.  She would prefer to avoid going back to the ER if possible.  We will have her try GoLYTELY at home.  I have asked her to drink this approximately 8 ounces at a time every 1-2 hours until she has softer stools.  If any worsening of abdominal pain, I did instruct her to go back to the ER.  We did complete a Toradol injection as well as Zofran injection to help with discomfort so that she can work on pushing fluids back at home and focus on emptying bowels.  We will check in with her tomorrow to see how symptoms are doing. - DG Abd 2 Views; Future - ondansetron (ZOFRAN) injection 4 mg - ketorolac (TORADOL) injection 60 mg   Return if symptoms worsen or fail to improve.    Theodis Shove, MD

## 2020-04-13 ENCOUNTER — Inpatient Hospital Stay (HOSPITAL_COMMUNITY)
Admission: EM | Admit: 2020-04-13 | Discharge: 2020-04-20 | DRG: 683 | Disposition: A | Discharge status: Home / Self Care | Payer: BC Managed Care – PPO | Attending: Internal Medicine | Admitting: Internal Medicine

## 2020-04-13 ENCOUNTER — Telehealth: Payer: Self-pay | Admitting: *Deleted

## 2020-04-13 ENCOUNTER — Encounter (HOSPITAL_COMMUNITY): Payer: Self-pay | Admitting: Emergency Medicine

## 2020-04-13 ENCOUNTER — Other Ambulatory Visit: Payer: Self-pay

## 2020-04-13 ENCOUNTER — Emergency Department (HOSPITAL_COMMUNITY): Payer: BC Managed Care – PPO

## 2020-04-13 DIAGNOSIS — Z91041 Radiographic dye allergy status: Secondary | ICD-10-CM

## 2020-04-13 DIAGNOSIS — N170 Acute kidney failure with tubular necrosis: Secondary | ICD-10-CM | POA: Diagnosis present

## 2020-04-13 DIAGNOSIS — Z20822 Contact with and (suspected) exposure to covid-19: Secondary | ICD-10-CM | POA: Diagnosis present

## 2020-04-13 DIAGNOSIS — Z8632 Personal history of gestational diabetes: Secondary | ICD-10-CM | POA: Diagnosis not present

## 2020-04-13 DIAGNOSIS — K7401 Hepatic fibrosis, early fibrosis: Secondary | ICD-10-CM | POA: Diagnosis present

## 2020-04-13 DIAGNOSIS — E860 Dehydration: Secondary | ICD-10-CM | POA: Diagnosis present

## 2020-04-13 DIAGNOSIS — T39395A Adverse effect of other nonsteroidal anti-inflammatory drugs [NSAID], initial encounter: Secondary | ICD-10-CM | POA: Diagnosis present

## 2020-04-13 DIAGNOSIS — Z23 Encounter for immunization: Secondary | ICD-10-CM | POA: Diagnosis not present

## 2020-04-13 DIAGNOSIS — N179 Acute kidney failure, unspecified: Secondary | ICD-10-CM | POA: Diagnosis not present

## 2020-04-13 DIAGNOSIS — N320 Bladder-neck obstruction: Secondary | ICD-10-CM | POA: Diagnosis present

## 2020-04-13 DIAGNOSIS — R339 Retention of urine, unspecified: Secondary | ICD-10-CM | POA: Diagnosis not present

## 2020-04-13 DIAGNOSIS — R188 Other ascites: Secondary | ICD-10-CM | POA: Diagnosis present

## 2020-04-13 DIAGNOSIS — B1910 Unspecified viral hepatitis B without hepatic coma: Secondary | ICD-10-CM

## 2020-04-13 DIAGNOSIS — G47419 Narcolepsy without cataplexy: Secondary | ICD-10-CM | POA: Diagnosis present

## 2020-04-13 DIAGNOSIS — B181 Chronic viral hepatitis B without delta-agent: Secondary | ICD-10-CM | POA: Diagnosis present

## 2020-04-13 DIAGNOSIS — K766 Portal hypertension: Secondary | ICD-10-CM

## 2020-04-13 DIAGNOSIS — F1010 Alcohol abuse, uncomplicated: Secondary | ICD-10-CM | POA: Diagnosis present

## 2020-04-13 DIAGNOSIS — J90 Pleural effusion, not elsewhere classified: Secondary | ICD-10-CM

## 2020-04-13 DIAGNOSIS — E869 Volume depletion, unspecified: Secondary | ICD-10-CM | POA: Diagnosis present

## 2020-04-13 DIAGNOSIS — Z87891 Personal history of nicotine dependence: Secondary | ICD-10-CM | POA: Diagnosis not present

## 2020-04-13 DIAGNOSIS — B160 Acute hepatitis B with delta-agent with hepatic coma: Secondary | ICD-10-CM

## 2020-04-13 DIAGNOSIS — E871 Hypo-osmolality and hyponatremia: Secondary | ICD-10-CM | POA: Diagnosis present

## 2020-04-13 DIAGNOSIS — G8929 Other chronic pain: Secondary | ICD-10-CM

## 2020-04-13 DIAGNOSIS — R1084 Generalized abdominal pain: Secondary | ICD-10-CM | POA: Diagnosis present

## 2020-04-13 DIAGNOSIS — K7011 Alcoholic hepatitis with ascites: Secondary | ICD-10-CM | POA: Diagnosis not present

## 2020-04-13 DIAGNOSIS — I5031 Acute diastolic (congestive) heart failure: Secondary | ICD-10-CM | POA: Diagnosis not present

## 2020-04-13 DIAGNOSIS — Z9151 Personal history of suicidal behavior: Secondary | ICD-10-CM | POA: Diagnosis not present

## 2020-04-13 DIAGNOSIS — R109 Unspecified abdominal pain: Secondary | ICD-10-CM | POA: Diagnosis present

## 2020-04-13 DIAGNOSIS — R1010 Upper abdominal pain, unspecified: Secondary | ICD-10-CM | POA: Diagnosis not present

## 2020-04-13 DIAGNOSIS — R102 Pelvic and perineal pain: Secondary | ICD-10-CM | POA: Diagnosis not present

## 2020-04-13 DIAGNOSIS — R1013 Epigastric pain: Secondary | ICD-10-CM | POA: Diagnosis not present

## 2020-04-13 HISTORY — DX: Other chronic pain: G89.29

## 2020-04-13 LAB — CBC WITH DIFFERENTIAL/PLATELET
Abs Immature Granulocytes: 0.02 10*3/uL (ref 0.00–0.07)
Basophils Absolute: 0 10*3/uL (ref 0.0–0.1)
Basophils Relative: 0 %
Eosinophils Absolute: 0 10*3/uL (ref 0.0–0.5)
Eosinophils Relative: 0 %
HCT: 43.8 % (ref 36.0–46.0)
Hemoglobin: 15.2 g/dL — ABNORMAL HIGH (ref 12.0–15.0)
Immature Granulocytes: 0 %
Lymphocytes Relative: 28 %
Lymphs Abs: 2.2 10*3/uL (ref 0.7–4.0)
MCH: 31.2 pg (ref 26.0–34.0)
MCHC: 34.7 g/dL (ref 30.0–36.0)
MCV: 89.9 fL (ref 80.0–100.0)
Monocytes Absolute: 0.7 10*3/uL (ref 0.1–1.0)
Monocytes Relative: 9 %
Neutro Abs: 4.9 10*3/uL (ref 1.7–7.7)
Neutrophils Relative %: 63 %
Platelets: 270 10*3/uL (ref 150–400)
RBC: 4.87 MIL/uL (ref 3.87–5.11)
RDW: 11.9 % (ref 11.5–15.5)
WBC: 8 10*3/uL (ref 4.0–10.5)
nRBC: 0 % (ref 0.0–0.2)

## 2020-04-13 LAB — COMPREHENSIVE METABOLIC PANEL
ALT: 31 U/L (ref 0–44)
AST: 22 U/L (ref 15–41)
Albumin: 5.2 g/dL — ABNORMAL HIGH (ref 3.5–5.0)
Alkaline Phosphatase: 60 U/L (ref 38–126)
Anion gap: 15 (ref 5–15)
BUN: 41 mg/dL — ABNORMAL HIGH (ref 6–20)
CO2: 24 mmol/L (ref 22–32)
Calcium: 9.3 mg/dL (ref 8.9–10.3)
Chloride: 91 mmol/L — ABNORMAL LOW (ref 98–111)
Creatinine, Ser: 3.77 mg/dL — ABNORMAL HIGH (ref 0.44–1.00)
GFR, Estimated: 15 mL/min — ABNORMAL LOW (ref >60)
Glucose, Bld: 102 mg/dL — ABNORMAL HIGH (ref 70–99)
Potassium: 4 mmol/L (ref 3.5–5.1)
Sodium: 130 mmol/L — ABNORMAL LOW (ref 135–145)
Total Bilirubin: 1.5 mg/dL — ABNORMAL HIGH (ref 0.3–1.2)
Total Protein: 10.2 g/dL — ABNORMAL HIGH (ref 6.5–8.1)

## 2020-04-13 LAB — URINALYSIS, ROUTINE W REFLEX MICROSCOPIC
Bilirubin Urine: NEGATIVE
Glucose, UA: NEGATIVE mg/dL
Hgb urine dipstick: NEGATIVE
Ketones, ur: NEGATIVE mg/dL
Nitrite: NEGATIVE
Protein, ur: NEGATIVE mg/dL
Specific Gravity, Urine: 1.008 (ref 1.005–1.030)
pH: 5 (ref 5.0–8.0)

## 2020-04-13 LAB — RESPIRATORY PANEL BY RT PCR (FLU A&B, COVID)
Influenza A by PCR: NEGATIVE
Influenza B by PCR: NEGATIVE
SARS Coronavirus 2 by RT PCR: NEGATIVE

## 2020-04-13 LAB — PROTEIN, PLEURAL OR PERITONEAL FLUID: Total protein, fluid: <3 g/dL

## 2020-04-13 LAB — I-STAT BETA HCG BLOOD, ED (MC, WL, AP ONLY): I-stat hCG, quantitative: <5 m[IU]/mL (ref <5)

## 2020-04-13 LAB — ALBUMIN, PLEURAL OR PERITONEAL FLUID: Albumin, Fluid: <1 g/dL

## 2020-04-13 LAB — BODY FLUID CELL COUNT WITH DIFFERENTIAL
Lymphs, Fluid: 7 %
Monocyte-Macrophage-Serous Fluid: 89 % (ref 50–90)
Neutrophil Count, Fluid: 4 % (ref 0–25)
Total Nucleated Cell Count, Fluid: 204 cu mm (ref 0–1000)

## 2020-04-13 LAB — CREATININE, URINE, RANDOM: Creatinine, Urine: 73.57 mg/dL

## 2020-04-13 LAB — LACTATE DEHYDROGENASE, PLEURAL OR PERITONEAL FLUID: LD, Fluid: 10 U/L (ref 3–23)

## 2020-04-13 LAB — GLUCOSE, PLEURAL OR PERITONEAL FLUID: Glucose, Fluid: 89 mg/dL

## 2020-04-13 LAB — SODIUM, URINE, RANDOM: Sodium, Ur: 43 mmol/L

## 2020-04-13 LAB — LIPASE, BLOOD: Lipase: 28 U/L (ref 11–51)

## 2020-04-13 MED ORDER — ONDANSETRON HCL 4 MG PO TABS
4.0000 mg | ORAL_TABLET | Freq: Four times a day (QID) | ORAL | Status: DC | PRN
  Administered 2020-04-17 – 2020-04-18 (×2): 4 mg via ORAL
  Filled 2020-04-13 (×2): qty 1

## 2020-04-13 MED ORDER — SODIUM CHLORIDE 0.9 % IV SOLN: INTRAVENOUS | Status: DC

## 2020-04-13 MED ORDER — FENTANYL CITRATE (PF) 100 MCG/2ML IJ SOLN
50.0000 ug | Freq: Once | INTRAMUSCULAR | Status: AC
  Administered 2020-04-13: 50 ug via INTRAVENOUS
  Filled 2020-04-13: qty 2

## 2020-04-13 MED ORDER — LIDOCAINE HCL (PF) 1 % IJ SOLN
5.0000 mL | Freq: Once | INTRAMUSCULAR | Status: AC
  Administered 2020-04-13: 5 mL
  Filled 2020-04-13: qty 30

## 2020-04-13 MED ORDER — PROMETHAZINE HCL 25 MG/ML IJ SOLN
12.5000 mg | Freq: Once | INTRAMUSCULAR | Status: AC
  Administered 2020-04-13: 12.5 mg via INTRAVENOUS
  Filled 2020-04-13: qty 1

## 2020-04-13 MED ORDER — OXYCODONE HCL 5 MG PO TABS
5.0000 mg | ORAL_TABLET | ORAL | Status: DC | PRN
  Administered 2020-04-14 – 2020-04-20 (×8): 5 mg via ORAL
  Filled 2020-04-13 (×12): qty 1

## 2020-04-13 MED ORDER — ONDANSETRON HCL 4 MG/2ML IJ SOLN
4.0000 mg | Freq: Once | INTRAMUSCULAR | Status: AC
  Administered 2020-04-13: 4 mg via INTRAVENOUS
  Filled 2020-04-13: qty 2

## 2020-04-13 MED ORDER — ACETAMINOPHEN 650 MG RE SUPP: 650.0000 mg | Freq: Four times a day (QID) | RECTAL | Status: DC | PRN

## 2020-04-13 MED ORDER — ONDANSETRON HCL 4 MG/2ML IJ SOLN
4.0000 mg | Freq: Four times a day (QID) | INTRAMUSCULAR | Status: DC | PRN
  Administered 2020-04-14 – 2020-04-19 (×6): 4 mg via INTRAVENOUS
  Filled 2020-04-13 (×6): qty 2

## 2020-04-13 MED ORDER — ACETAMINOPHEN 325 MG PO TABS
650.0000 mg | ORAL_TABLET | Freq: Four times a day (QID) | ORAL | Status: DC | PRN
  Administered 2020-04-17 – 2020-04-19 (×5): 650 mg via ORAL
  Filled 2020-04-13 (×6): qty 2

## 2020-04-13 MED ORDER — SODIUM CHLORIDE 0.9 % IV BOLUS
1000.0000 mL | Freq: Once | INTRAVENOUS | Status: AC
  Administered 2020-04-13: 1000 mL via INTRAVENOUS

## 2020-04-13 MED ORDER — MORPHINE SULFATE (PF) 2 MG/ML IV SOLN
2.0000 mg | INTRAVENOUS | Status: DC | PRN
  Administered 2020-04-14 – 2020-04-19 (×15): 2 mg via INTRAVENOUS
  Filled 2020-04-13 (×16): qty 1

## 2020-04-13 MED ORDER — HEPARIN SODIUM (PORCINE) 5000 UNIT/ML IJ SOLN
5000.0000 [IU] | Freq: Three times a day (TID) | INTRAMUSCULAR | Status: DC
  Administered 2020-04-14 – 2020-04-17 (×10): 5000 [IU] via SUBCUTANEOUS
  Filled 2020-04-13 (×10): qty 1

## 2020-04-13 NOTE — Telephone Encounter (Signed)
-----   Message from Wynn Banker, MD sent at 04/12/2020  4:01 PM EDT ----- Please check in with her tomorrow (Thursday) to see how constipation/abd pain is doing.

## 2020-04-13 NOTE — Telephone Encounter (Signed)
Spoke with the pt and she stated she is still having abdominal pain and used Golytely.  States the pain is now spreading to her shoulders, arms and she has had shortness of breath since this morning.  I advised the pt to go to the emergency room and she agreed.  Message sent to PCP.

## 2020-04-13 NOTE — H&P (Signed)
[facility] History and Physical  Kim Pacheco ZOX:[id number] DOB: [birth year] DOA: 04/13/2020   PCP: Wynn Banker, MD  Specialists: Previously seen by Dr. Christella Hartigan with gastroenterology.  Also followed by OB/GYN.  Chief Complaint: Abdominal pain ongoing for the past 1 week  HPI: Kim Pacheco is a 34 y.o. female with a past medical history of hepatitis B, ascites of unclear etiology, history of depression, history of constipation, alcohol abuse, who had similar presentation back in late 2019 and 2020 with abdominal pain and ascites.  She was seen by gastroenterology.  She underwent EGD in February 2020 which showed gastritis.  Colonoscopy was done in July 2020 which was also reported as being normal.  It appears the patient subsequently underwent diagnostic laparoscopy in the [facility] system in March 2020.  She apparently was also followed by a liver specialist [place].  Her current symptoms started about a week ago.  She had some chills but denies any fever.  Has had nausea with poor oral intake.  Has had a few episodes of vomiting.  Couple episodes of loose stool which she attributes to taking antiemetic tablets.  She has had 2 visits to the emergency department prior to tonight.  She was thought to have a ruptured ovarian cyst.  She was prescribed naproxen.  She comes back today with the worsening symptoms.  Pain is 6 out of 10 in intensity.  Diffusely present all over the abdomen without any radiation.  No obvious precipitant identified.  No aggravating or relieving factors.  Denies any vaginal discharge.  No dysuria.  No bleeding.  In the emergency department she underwent blood work which showed hyponatremia, elevated BUN and creatinine.  She underwent a CT scan of the abdomen pelvis which showed moderate ascites.  She subsequently underwent paracentesis by the ER provider.  She will need hospitalization for further evaluation and management.  Home  Medications: Prior to Admission medications   Medication Sig Start Date End Date Taking? Authorizing Provider  docusate sodium (COLACE) 100 MG capsule Take 1 capsule (100 mg total) by mouth 2 (two) times daily. 04/12/20  Yes Koberlein, Paris Lore, MD  HYDROcodone-acetaminophen (NORCO/VICODIN) 5-325 MG tablet Take 1 tablet by mouth every 6 (six) hours as needed. Patient taking differently: Take 1 tablet by mouth every 6 (six) hours as needed for moderate pain or severe pain.  04/10/20  Yes Linwood Dibbles, MD  ibuprofen (ADVIL) 200 MG tablet Take 200 mg by mouth every 6 (six) hours as needed for moderate pain.   Yes [provider]  naproxen (NAPROSYN) 375 MG tablet Take 1 tablet (375 mg total) by mouth 2 (two) times daily with a meal for 7 days. Patient taking differently: Take 375 mg by mouth 2 (two) times daily as needed for moderate pain.  04/07/20 04/14/20 Yes Walisiewicz, Kaitlyn E, PA-C  ondansetron (ZOFRAN ODT) 4 MG disintegrating tablet Take 1 tablet (4 mg total) by mouth every 8 (eight) hours as needed for nausea or vomiting. 04/12/20  Yes Koberlein, Paris Lore, MD  polyethylene glycol (GOLYTELY) 236 g solution Drink 8 oz solution every 1-2 hours as needed for constipation 04/12/20  Yes Koberlein, Paris Lore, MD    Allergies:  Allergies  Allergen Reactions  . Contrast Media [Iodinated Diagnostic Agents] Anaphylaxis, Shortness Of Breath and Rash    IV contrast     Past Medical History: Past Medical History:  Diagnosis Date  . Abdominal pain    started in mid abd and spread to left  side/on and off/since Jan 2020/ pt states, she has fluid in abd/unknown  . Abnormal Pap smear    normal 06/2013  . Anxiety   . Constipation    hx of  . Depression    suicide attempt in college  . Diabetes mellitus without complication (HCC)    gestational, no issues since pregnancy per her report - resolved  . Gestational diabetes    /no meds now - resolved  . Hepatitis    Hep B, managed by Dr. Kinnie Scales   . History of chicken pox   . History of UTI   . Vaginal Pap smear, abnormal     Past Surgical History:  Procedure Laterality Date  . CESAREAN SECTION N/A 01/26/2013   Procedure: Primary Cesarean Section Delivery Baby  Boy @ 0017, Apgars 4/8/9;  Surgeon: Freddrick March. Tenny Craw, MD;  Location: [facility];  Service: Obstetrics;  Laterality: N/A;  . CESAREAN SECTION N/A 11/18/2017   Procedure: CESAREAN SECTION;  Surgeon: Olivia Mackie, MD;  Location: [facility] BIRTHING SUITES;  Service: Obstetrics;  Laterality: N/A;  . COLONOSCOPY  2020  . ESOPHAGOGASTRODUODENOSCOPY ENDOSCOPY    . laproscopy  2020  . NASAL SINUS SURGERY  1996  . WISDOM TOOTH EXTRACTION      Social History: Lives with family.  No smoking.  She admits to drinking 3 to 4 glasses of wine every day.  But has not had any alcohol in the last 1 week.  Family History:  Family History  Adopted: Yes  She is adopted.  She does not know family medical history.  Review of Systems - History obtained from the patient General ROS: positive for  - fatigue Psychological ROS: negative Ophthalmic ROS: negative ENT ROS: negative Allergy and Immunology ROS: negative Hematological and Lymphatic ROS: negative Endocrine ROS: negative Respiratory ROS: no cough, shortness of breath, or wheezing Cardiovascular ROS: no chest pain or dyspnea on exertion Gastrointestinal ROS: As in HPI Genito-Urinary ROS: no dysuria, trouble voiding, or hematuria Musculoskeletal ROS: negative Neurological ROS: no TIA or stroke symptoms Dermatological ROS: negative  Physical Examination  Vitals:   04/13/20 2115 04/13/20 2130 04/13/20 2200 04/13/20 2230  BP: 117/87 121/84 (!) 115/98 117/84  Pulse: 69 70 68 61  Resp: 17 16 16 17   Temp:      TempSrc:      SpO2: 100% 99% 100% 99%    BP 117/84   Pulse 61   Temp 98.3 F (36.8 C) (Oral)   Resp 17   LMP 02/29/2020   SpO2 99%   General appearance: alert, cooperative, appears stated age and no distress Head:  Normocephalic, without obvious abnormality, atraumatic Eyes: conjunctivae/corneas clear. PERRL, EOM's intact.  Throat: lips, mucosa, and tongue normal; teeth and gums normal Neck: no adenopathy, no carotid bruit, no JVD, supple, symmetrical, trachea midline and thyroid not enlarged, symmetric, no tenderness/mass/nodules Resp: clear to auscultation bilaterally Cardio: regular rate and rhythm, S1, S2 normal, no murmur, click, rub or gallop GI: Abdomen noted to be mildly distended.  Tender diffusely without any rebound rigidity or guarding.  No obvious masses organomegaly appreciated.  Bowel sounds present. Extremities: extremities normal, atraumatic, no cyanosis or edema Pulses: 2+ and symmetric Skin: Skin color, texture, turgor normal. No rashes or lesions Lymph nodes: Cervical, supraclavicular, and axillary nodes normal. Neurologic: Alert and oriented x3.  No obvious focal neurological deficits.     Labs on Admission: I have personally reviewed following labs and imaging studies  CBC: Recent Labs  Lab 04/07/20 1319 04/10/20 1153  04/13/20 1830  WBC 5.9 5.8 8.0  NEUTROABS  --   --  4.9  HGB 13.8 14.1 15.2*  HCT 41.0 42.5 43.8  MCV 91.7 93.2 89.9  PLT 233 229 270   Basic Metabolic Panel: Recent Labs  Lab 04/07/20 1319 04/10/20 1153 04/13/20 1830  NA 138 139 130*  K 4.4 4.4 4.0  CL 102 103 91*  CO2 25 26 24   GLUCOSE 106* 133* 102*  BUN 10 15 41*  CREATININE 0.74 0.84 3.77*  CALCIUM 8.9 9.5 9.3   GFR: Estimated Creatinine Clearance: 16.8 mL/min (A) (by C-G formula based on SCr of 3.77 mg/dL (H)). Liver Function Tests: Recent Labs  Lab 04/07/20 1319 04/10/20 1153 04/13/20 1830  AST 39 24 22  ALT 50* 37 31  ALKPHOS 44 47 60  BILITOT 0.9 1.0 1.5*  PROT 7.9 8.2* 10.2*  ALBUMIN 4.2 4.4 5.2*   Recent Labs  Lab 04/07/20 1319 04/10/20 1153 04/13/20 1830  LIPASE 29 32 28     Radiological Exams on Admission: CT ABDOMEN PELVIS WO CONTRAST  Result Date:  04/13/2020 CLINICAL DATA:  Acute severe nonlocalized abdominal pain with rebound tenderness. Concern for acute abdomen. Recent diagnosis of ruptured ovarian cyst. EXAM: CT ABDOMEN AND PELVIS WITHOUT CONTRAST TECHNIQUE: Multidetector CT imaging of the abdomen and pelvis was performed following the standard protocol without IV contrast. COMPARISON:  Abdominal CT and pelvic ultrasound 6 days ago 04/07/2020. CT 06/26/2018 also reviewed. FINDINGS: Lower chest: The lung bases are clear. No focal consolidation or pleural effusion Hepatobiliary: No focal liver abnormality is seen. Borderline hepatic steatosis. No gallstones, gallbladder wall thickening, or biliary dilatation. Pancreas: No ductal dilatation or inflammation. Spleen: Normal in size without focal abnormality. Adrenals/Urinary Tract: Normal adrenal glands. No renal stone or hydronephrosis. No perinephric edema. Urinary bladder is physiologically distended. There is a contour deformity of the bladder dome which may represent a bladder diverticulum, for example series 6, image 81. No bladder wall thickening. Stomach/Bowel: Bowel evaluation is limited in the absence of contrast and presence of intra-abdominal ascites. Ingested material within the stomach. No obvious gastric wall thickening. Decompressed small bowel without inflammation or obvious wall thickening. Liquid stool in the colon. No formed stool. The appendix is not well seen on the current exam. No evidence of appendicitis. Vascular/Lymphatic: Abdominal aorta is normal in caliber. Duplicated IVC. No adenopathy. Reproductive: Uterus is unremarkable. Ovaries tentatively visualized and symmetric in size. No adnexal mass. Other: Moderate to large volume of abdominal and pelvic ascites, progressed from recent exam. Ascites was also seen on January 2020 CT. The etiology is indeterminate. Ascites measures simple fluid density. There is no hemorrhagic component. No free air. Musculoskeletal: There are no acute  or suspicious osseous abnormalities. IMPRESSION: 1. Moderate to large volume of abdominal and pelvic ascites, progressed from recent exam. Cause for ascites is not identified. There is no hemorrhagic component nor free air. Consider fluid sampling. 2. Contour deformity of the bladder dome may represent a bladder diverticulum. 3. Duplicated IVC. Electronically Signed   By: February 2020 M.D.   On: 04/13/2020 20:02   DG Chest 2 View  Result Date: 04/13/2020 CLINICAL DATA:  Shortness of breath EXAM: CHEST - 2 VIEW COMPARISON:  08/05/2014 FINDINGS: Cardiac shadow is within normal limits. The lungs are well aerated bilaterally. No focal infiltrate or sizable effusion is seen. No acute bony abnormality is noted. IMPRESSION: No active cardiopulmonary disease. Electronically Signed   By: 08/07/2014 M.D.   On: 04/13/2020 18:44  DG Abd 2 Views  Result Date: 04/12/2020 CLINICAL DATA:  Abdominal pain for 5 days. EXAM: ABDOMEN - 2 VIEW COMPARISON:  Abdominal CT and pelvic ultrasound 04/07/2020, 5 days ago. FINDINGS: No free intra-abdominal air. No bowel dilatation to suggest obstruction. There is a moderate volume of stool in the ascending, transverse, and descending colon. No abnormal rectal distension. Pelvic calcifications are unchanged from prior exam and consistent with phleboliths. No radiopaque calculi. The lung bases are clear. No osseous abnormalities are seen. IMPRESSION: Normal bowel gas pattern. Moderate colonic stool burden. Electronically Signed   By: Narda RutherfordMelanie  Sanford M.D.   On: 04/12/2020 15:28    My interpretation of Electrocardiogram: Sinus rhythm, 72 bpm.  Normal axis.  Intervals are normal.  No concerning ST or T wave changes noted.   Problem List  Principal Problem:   ARF (acute renal failure) (HCC) Active Problems:   Hepatitis B   Ascites   Abdominal pain   Assessment: This is a 34 year old female with past medical history as stated earlier who comes in with a 1 week history of  abdominal pain and distention.  She has undergone extensive evaluation in the emergency department.   She is noted to have ascites of unclear etiology.  She is noted to be in acute renal failure today.  Plan:  1. Acute renal failure/hyponatremia: Creatinine was 0.84 on November 1.  Creatinine noted to be 3.77 today with a BUN of 41.  This is likely due to combination of poor oral intake as well as NSAID use.  She has been making urine although less in amount.  She will be given IV fluids.  CT scan does not show any obstructive uropathy.  No kidney stone noted.  UA reviewed.  Contaminated sample.  Monitor urine output.  Recheck labs in the morning.  2. Ascites: Etiology is unclear.  She has had this previously as well.  Unclear if she has had a paracentesis previously or not.  The SAAG gradient is greater than 1.1.  Cell count does not suggest SBP.  She does have a history of hepatitis B although there is no overt cirrhosis noted on imaging studies.  Will request gastroenterology to evaluate this patient. She mentions that she underwent a pelvic exam earlier today by her gynecologist which did not reveal any abnormal findings. Urine pregnancy test is negative.  Pelvic ultrasound done a few days ago did not show any acute findings.  3. History of alcohol abuse: Admits to drinking 3 to 4 glasses of wine daily.  Has not had any drink in 1 week.  Currently all vitals are stable.  Monitor for withdrawal symptoms.  4.  History of hepatitis B: Apparently used to follow by liver specialist in [place].  Not sure if she continues to do that or not.  It looks like she used to be on tenofovir previously.  This is not listed on her medication list currently.  DVT Prophylaxis: Subcutaneous heparin Code Status: Full code Family Communication: Discussed with the patient.  No family at bedside Disposition: Hopefully return home in improved Consults called: Gastroenterology consulted via epic Admission Status: Status  is: Inpatient  Remains inpatient appropriate because:Ongoing diagnostic testing needed not appropriate for outpatient work up, IV treatments appropriate due to intensity of illness or inability to take PO and Inpatient level of care appropriate due to severity of illness   Dispo: The patient is from: Home              Anticipated d/c  is to: Home              Anticipated d/c date is: 2 days              Patient currently is not medically stable to d/c.    Severity of Illness: The appropriate patient status for this patient is INPATIENT. Inpatient status is judged to be reasonable and necessary in order to provide the required intensity of service to ensure the patient's safety. The patient's presenting symptoms, physical exam findings, and initial radiographic and laboratory data in the context of their chronic comorbidities is felt to place them at high risk for further clinical deterioration. Furthermore, it is not anticipated that the patient will be medically stable for discharge from the hospital within 2 midnights of admission. The following factors support the patient status of inpatient.   " The patient's presenting symptoms include nausea vomiting abdominal pain. " The worrisome physical exam findings include abdominal tenderness. " The initial radiographic and laboratory data are worrisome because of acute renal failure. " The chronic co-morbidities include hepatitis B.   * I certify that at the point of admission it is my clinical judgment that the patient will require inpatient hospital care spanning beyond 2 midnights from the point of admission due to high intensity of service, high risk for further deterioration and high frequency of surveillance required.*   Further management decisions will depend on results of further testing and patient's response to treatment.   Brynna Dobos [facility]  [facility] [employer] on [email or web address]  04/13/2020, 11:03 PM

## 2020-04-13 NOTE — ED Notes (Signed)
Attempted to call report on pt.  Nurse was busy in a OVID positive pt's room.  Informed that this nurse should call back in 10 minutes.

## 2020-04-13 NOTE — ED Triage Notes (Signed)
Pt states she was seen recently for a ruptured ovarian cyst and severe constipation. States she has been taking go lytely x 2 days and now has cramping in her back and shoulders. Alert and oriented.

## 2020-04-13 NOTE — ED Provider Notes (Signed)
[facility] Note   CSN: [id number] Arrival date & time: 04/13/20  1651     History Chief Complaint  Patient presents with   Constipation   Generalized Body Aches    Kim Pacheco is a 34 y.o. female.  Presents to ER with concern for abdominal pain.  Patient reports that she has been having abdominal pain for the past week or so.  When she came to ER CT scan negative, radiologist suspected recent ovarian cyst rupture.  TVUS negative.  Followed up with OB/GYN, reportedly normal pelvic exam.  Follow-up with primary doctor, x-ray of abdomen concerning for constipation.  Patient states that she started taking GoLYTELY today without improvement in symptoms.  Pain is generalized, both sides, throughout abdomen.  No alleviating symptoms.  Has not taken any medication for pain today.  Some nausea but no vomiting.  No diarrhea constipation.  Reports he had a very similar episode in 2020, reports that for for multiple CT scans, endoscopy colonoscopy all of which were grossly negative except for per my review small ascites, not sufficient to perform tap.  Additional dose history obtained via chart review.  HPI     Past Medical History:  Diagnosis Date   Abdominal pain    started in mid abd and spread to left side/on and off/since Jan 2020/ pt states, she has fluid in abd/unknown   Abnormal Pap smear    normal 06/2013   Anxiety    Constipation    hx of   Depression    suicide attempt in college   Diabetes mellitus without complication (HCC)    gestational, no issues since pregnancy per her report - resolved   Gestational diabetes    /no meds now - resolved   Hepatitis    Hep B, managed by Dr. Kinnie Scales   History of chicken pox    History of UTI    Vaginal Pap smear, abnormal     Patient Active Problem List   Diagnosis Date Noted   Abdominal pain 04/13/2020   Narcolepsy without cataplexy 02/02/2020   Depression,  major, single episode, moderate (HCC) 10/28/2018   ARF (acute renal failure) (HCC) 06/25/2018   Alcohol abuse 06/24/2018   Constipation 06/24/2018   Ascites 06/24/2018   Body mass index (BMI) of 25.0 to 29.9 05/29/2018   Fibrocystic disease of breast 05/29/2018   Metrorrhagia 05/29/2018   Urinary tract infectious disease 05/29/2018   Oral hypoglycemic controlled White classification A2 gestational diabetes mellitus (GDM) 11/18/2017   Previous cesarean delivery, delivered: arrest of dilation  11/18/2017   Abnormal glucose tolerance test (GTT) during pregnancy, antepartum 09/17/2017   Hepatitis B 06/14/2014    Past Surgical History:  Procedure Laterality Date   CESAREAN SECTION N/A 01/26/2013   Procedure: Primary Cesarean Section Delivery Baby  Boy @ 0017, Apgars 4/8/9;  Surgeon: Freddrick March. Tenny Craw, MD;  Location: [facility];  Service: Obstetrics;  Laterality: N/A;   CESAREAN SECTION N/A 11/18/2017   Procedure: CESAREAN SECTION;  Surgeon: Olivia Mackie, MD;  Location: [facility];  Service: Obstetrics;  Laterality: N/A;   COLONOSCOPY  2020   ESOPHAGOGASTRODUODENOSCOPY ENDOSCOPY     laproscopy  2020   NASAL SINUS SURGERY  1996   WISDOM TOOTH EXTRACTION       OB History    Gravida  7   Para  2   Term  2   Preterm      AB  5   Living  2  SAB  0   TAB  5   Ectopic      Multiple  0   Live Births  2           Family History  Adopted: Yes    Social History   Tobacco Use   Smoking status: Former Smoker    Packs/day: 1.00    Types: Cigarettes    Quit date: 07/23/2011    Years since quitting: 8.7   Smokeless tobacco: Never Used   Tobacco comment: smoked for 8 years, quit in 2013  Vaping Use   Vaping Use: Never used  Substance Use Topics   Alcohol use: Yes    Alcohol/week: 5.0 standard drinks    Types: 5 Glasses of wine per week   Drug use: Not Currently    Types: Marijuana    Comment: Last use 12/19/18    Home  Medications Prior to Admission medications   Medication Sig Start Date End Date Taking? Authorizing Provider  docusate sodium (COLACE) 100 MG capsule Take 1 capsule (100 mg total) by mouth 2 (two) times daily. 04/12/20  Yes Koberlein, Paris Lore, MD  HYDROcodone-acetaminophen (NORCO/VICODIN) 5-325 MG tablet Take 1 tablet by mouth every 6 (six) hours as needed. Patient taking differently: Take 1 tablet by mouth every 6 (six) hours as needed for moderate pain or severe pain.  04/10/20  Yes Linwood Dibbles, MD  ibuprofen (ADVIL) 200 MG tablet Take 200 mg by mouth every 6 (six) hours as needed for moderate pain.   Yes [provider]  naproxen (NAPROSYN) 375 MG tablet Take 1 tablet (375 mg total) by mouth 2 (two) times daily with a meal for 7 days. Patient taking differently: Take 375 mg by mouth 2 (two) times daily as needed for moderate pain.  04/07/20 04/14/20 Yes Walisiewicz, Kaitlyn E, PA-C  ondansetron (ZOFRAN ODT) 4 MG disintegrating tablet Take 1 tablet (4 mg total) by mouth every 8 (eight) hours as needed for nausea or vomiting. 04/12/20  Yes Koberlein, Paris Lore, MD  polyethylene glycol (GOLYTELY) 236 g solution Drink 8 oz solution every 1-2 hours as needed for constipation 04/12/20  Yes Koberlein, Junell C, MD    Allergies    Contrast media [iodinated diagnostic agents]  Review of Systems   Review of Systems  Constitutional: Negative for chills and fever.  HENT: Negative for ear pain and sore throat.   Eyes: Negative for pain and visual disturbance.  Respiratory: Positive for shortness of breath. Negative for cough.   Cardiovascular: Negative for chest pain and palpitations.  Gastrointestinal: Positive for abdominal pain and nausea. Negative for vomiting.  Genitourinary: Negative for dysuria and hematuria.  Musculoskeletal: Negative for arthralgias and back pain.  Skin: Negative for color change and rash.  Neurological: Negative for seizures and syncope.  All other systems reviewed and  are negative.   Physical Exam Updated Vital Signs BP (!) 123/91 (BP Location: Left Arm)    Pulse 71    Temp 98.2 F (36.8 C) (Oral)    Resp 18    LMP 02/29/2020    SpO2 99%   Physical Exam Vitals and nursing note reviewed.  Constitutional:      General: She is not in acute distress.    Appearance: She is well-developed.  HENT:     Head: Normocephalic and atraumatic.  Eyes:     Conjunctiva/sclera: Conjunctivae normal.  Cardiovascular:     Rate and Rhythm: Normal rate and regular rhythm.     Heart sounds: No  murmur heard.   Pulmonary:     Effort: Pulmonary effort is normal. No respiratory distress.     Breath sounds: Normal breath sounds.  Abdominal:     General: There is distension.     Palpations: Abdomen is soft.     Tenderness: There is abdominal tenderness. There is rebound. There is no guarding.  Musculoskeletal:        General: No deformity or signs of injury.     Cervical back: Neck supple.  Skin:    General: Skin is warm and dry.  Neurological:     General: No focal deficit present.     Mental Status: She is alert and oriented to person, place, and time.  Psychiatric:        Mood and Affect: Mood normal.        Behavior: Behavior normal.     ED Results / Procedures / Treatments   Labs (all labs ordered are listed, but only abnormal results are displayed) Labs Reviewed  CBC WITH DIFFERENTIAL/PLATELET - Abnormal; Notable for the following components:      Result Value   Hemoglobin 15.2 (*)    All other components within normal limits  COMPREHENSIVE METABOLIC PANEL - Abnormal; Notable for the following components:   Sodium 130 (*)    Chloride 91 (*)    Glucose, Bld 102 (*)    BUN 41 (*)    Creatinine, Ser 3.77 (*)    Total Protein 10.2 (*)    Albumin 5.2 (*)    Total Bilirubin 1.5 (*)    GFR, Estimated 15 (*)    All other components within normal limits  URINALYSIS, ROUTINE W REFLEX MICROSCOPIC - Abnormal; Notable for the following components:    APPearance HAZY (*)    Leukocytes,Ua TRACE (*)    Bacteria, UA RARE (*)    All other components within normal limits  BODY FLUID CELL COUNT WITH DIFFERENTIAL - Abnormal; Notable for the following components:   Color, Fluid YELLOW (*)    All other components within normal limits  RESPIRATORY PANEL BY RT PCR (FLU A&B, COVID)  BODY FLUID CULTURE  LIPASE, BLOOD  CREATININE, URINE, RANDOM  SODIUM, URINE, RANDOM  LACTATE DEHYDROGENASE, PLEURAL OR PERITONEAL FLUID  GLUCOSE, PLEURAL OR PERITONEAL FLUID  PROTEIN, PLEURAL OR PERITONEAL FLUID  ALBUMIN, PLEURAL OR PERITONEAL FLUID  HIV ANTIBODY (ROUTINE TESTING W REFLEX)  TSH  COMPREHENSIVE METABOLIC PANEL  CBC  PROTIME-INR  I-STAT BETA HCG BLOOD, ED ([facility], [facility], [facility] ONLY)  CYTOLOGY - NON PAP    EKG EKG Interpretation  Date/Time:  Thursday April 13 2020 18:28:36 EDT Ventricular Rate:  72 PR Interval:    QRS Duration: 71 QT Interval:  379 QTC Calculation: 415 R Axis:   47 Text Interpretation: Sinus rhythm Confirmed by Marianna Fussykstra, Alliah Boulanger ([id number]) on 04/13/2020 6:48:33 PM   Radiology CT ABDOMEN PELVIS WO CONTRAST  Result Date: 04/13/2020 CLINICAL DATA:  Acute severe nonlocalized abdominal pain with rebound tenderness. Concern for acute abdomen. Recent diagnosis of ruptured ovarian cyst. EXAM: CT ABDOMEN AND PELVIS WITHOUT CONTRAST TECHNIQUE: Multidetector CT imaging of the abdomen and pelvis was performed following the standard protocol without IV contrast. COMPARISON:  Abdominal CT and pelvic ultrasound 6 days ago 04/07/2020. CT 06/26/2018 also reviewed. FINDINGS: Lower chest: The lung bases are clear. No focal consolidation or pleural effusion Hepatobiliary: No focal liver abnormality is seen. Borderline hepatic steatosis. No gallstones, gallbladder wall thickening, or biliary dilatation. Pancreas: No ductal dilatation or inflammation. Spleen: Normal in size  without focal abnormality. Adrenals/Urinary Tract: Normal adrenal glands. No renal  stone or hydronephrosis. No perinephric edema. Urinary bladder is physiologically distended. There is a contour deformity of the bladder dome which may represent a bladder diverticulum, for example series 6, image 81. No bladder wall thickening. Stomach/Bowel: Bowel evaluation is limited in the absence of contrast and presence of intra-abdominal ascites. Ingested material within the stomach. No obvious gastric wall thickening. Decompressed small bowel without inflammation or obvious wall thickening. Liquid stool in the colon. No formed stool. The appendix is not well seen on the current exam. No evidence of appendicitis. Vascular/Lymphatic: Abdominal aorta is normal in caliber. Duplicated IVC. No adenopathy. Reproductive: Uterus is unremarkable. Ovaries tentatively visualized and symmetric in size. No adnexal mass. Other: Moderate to large volume of abdominal and pelvic ascites, progressed from recent exam. Ascites was also seen on January 2020 CT. The etiology is indeterminate. Ascites measures simple fluid density. There is no hemorrhagic component. No free air. Musculoskeletal: There are no acute or suspicious osseous abnormalities. IMPRESSION: 1. Moderate to large volume of abdominal and pelvic ascites, progressed from recent exam. Cause for ascites is not identified. There is no hemorrhagic component nor free air. Consider fluid sampling. 2. Contour deformity of the bladder dome may represent a bladder diverticulum. 3. Duplicated IVC. Electronically Signed   By: Narda Rutherford M.D.   On: 04/13/2020 20:02   DG Chest 2 View  Result Date: 04/13/2020 CLINICAL DATA:  Shortness of breath EXAM: CHEST - 2 VIEW COMPARISON:  08/05/2014 FINDINGS: Cardiac shadow is within normal limits. The lungs are well aerated bilaterally. No focal infiltrate or sizable effusion is seen. No acute bony abnormality is noted. IMPRESSION: No active cardiopulmonary disease. Electronically Signed   By: Alcide Clever M.D.   On: 04/13/2020  18:44   DG Abd 2 Views  Result Date: 04/12/2020 CLINICAL DATA:  Abdominal pain for 5 days. EXAM: ABDOMEN - 2 VIEW COMPARISON:  Abdominal CT and pelvic ultrasound 04/07/2020, 5 days ago. FINDINGS: No free intra-abdominal air. No bowel dilatation to suggest obstruction. There is a moderate volume of stool in the ascending, transverse, and descending colon. No abnormal rectal distension. Pelvic calcifications are unchanged from prior exam and consistent with phleboliths. No radiopaque calculi. The lung bases are clear. No osseous abnormalities are seen. IMPRESSION: Normal bowel gas pattern. Moderate colonic stool burden. Electronically Signed   By: Narda Rutherford M.D.   On: 04/12/2020 15:28    Procedures .Paracentesis  Date/Time: 04/13/2020 10:24 PM Performed by: Milagros Loll, MD Authorized by: Milagros Loll, MD   Consent:    Consent obtained:  Verbal   Consent given by:  Patient   Risks discussed:  Bleeding, bowel perforation, infection and pain   Alternatives discussed:  No treatment and delayed treatment Pre-procedure details:    Procedure purpose:  Diagnostic   Preparation: Patient was prepped and draped in usual sterile fashion   Anesthesia (see MAR for exact dosages):    Anesthesia method:  Local infiltration   Local anesthetic:  Lidocaine 1% w/o epi Procedure details:    Needle gauge:  22   Ultrasound guidance: yes     Puncture site:  L lower quadrant   Fluid removed amount:  20   Fluid appearance:  Amber   (including critical care time)  Medications Ordered in ED Medications  0.9 %  sodium chloride infusion ( Intravenous New Bag/Given 04/13/20 2000)  heparin injection 5,000 Units (has no administration in time range)  acetaminophen (TYLENOL)  tablet 650 mg (has no administration in time range)    Or  acetaminophen (TYLENOL) suppository 650 mg (has no administration in time range)  ondansetron (ZOFRAN) tablet 4 mg ( Oral See Alternative 04/14/20 0035)    Or   ondansetron (ZOFRAN) injection 4 mg (4 mg Intravenous Given 04/14/20 0035)  morphine 2 MG/ML injection 2 mg (2 mg Intravenous Given 04/14/20 0034)  oxyCODONE (Oxy IR/ROXICODONE) immediate release tablet 5 mg (has no administration in time range)  influenza vac split quadrivalent PF (FLUARIX) injection 0.5 mL (has no administration in time range)  fentaNYL (SUBLIMAZE) injection 50 mcg (50 mcg Intravenous Given 04/13/20 1826)  ondansetron (ZOFRAN) injection 4 mg (4 mg Intravenous Given 04/13/20 1824)  promethazine (PHENERGAN) injection 12.5 mg (12.5 mg Intravenous Given 04/13/20 1957)  sodium chloride 0.9 % bolus 1,000 mL (0 mLs Intravenous Stopped 04/13/20 2312)  fentaNYL (SUBLIMAZE) injection 50 mcg (50 mcg Intravenous Given 04/13/20 2042)  lidocaine (PF) (XYLOCAINE) 1 % injection 5 mL (5 mLs Infiltration Given 04/13/20 2043)    ED Course  I have reviewed the triage vital signs and the nursing notes.  Pertinent labs & imaging results that were available during my care of the patient were reviewed by me and considered in my medical decision making (see chart for details).  Clinical Course as of Apr 14 37  Thu Apr 13, 2020  2109 Succesful bedside diagnostic paracentesis, gave sample to rn to send to lab   [RD]    Clinical Course User Index [RD] Milagros Loll, MD   MDM Rules/Calculators/A&P                          34 year old lady who presented to the emergency room with complaints of abdominal pain.  On exam, noted significant tenderness but patient was otherwise well-appearing.  Labs notable for acute kidney injury.  CT scan demonstrating moderate to large volume ascites without clear explanation.  Patient had similar episode in 2020, underwent extensive work-up which ultimately was grossly negative.  Performed paracentesis, no SBP.  Will admit to hospitalist for further hydration, observation.  Dr. Rito Ehrlich accepting.  Final Clinical Impression(s) / ED Diagnoses Final diagnoses:   Other ascites  AKI (acute kidney injury) (HCC)  Generalized abdominal pain    Rx / DC Orders ED Discharge Orders    None       Milagros Loll, MD 04/14/20 [phone]

## 2020-04-14 ENCOUNTER — Inpatient Hospital Stay (HOSPITAL_COMMUNITY): Payer: BC Managed Care – PPO

## 2020-04-14 ENCOUNTER — Encounter (HOSPITAL_COMMUNITY): Payer: Self-pay | Admitting: Internal Medicine

## 2020-04-14 DIAGNOSIS — R1013 Epigastric pain: Secondary | ICD-10-CM

## 2020-04-14 DIAGNOSIS — R188 Other ascites: Secondary | ICD-10-CM

## 2020-04-14 DIAGNOSIS — I5031 Acute diastolic (congestive) heart failure: Secondary | ICD-10-CM | POA: Diagnosis not present

## 2020-04-14 LAB — ECHOCARDIOGRAM COMPLETE
Area-P 1/2: 2.99 cm2
S' Lateral: 2.4 cm

## 2020-04-14 LAB — COMPREHENSIVE METABOLIC PANEL
ALT: 30 U/L (ref 0–44)
AST: 23 U/L (ref 15–41)
Albumin: 4.6 g/dL (ref 3.5–5.0)
Alkaline Phosphatase: 59 U/L (ref 38–126)
Anion gap: 14 (ref 5–15)
BUN: 48 mg/dL — ABNORMAL HIGH (ref 6–20)
CO2: 18 mmol/L — ABNORMAL LOW (ref 22–32)
Calcium: 8.7 mg/dL — ABNORMAL LOW (ref 8.9–10.3)
Chloride: 97 mmol/L — ABNORMAL LOW (ref 98–111)
Creatinine, Ser: 4.22 mg/dL — ABNORMAL HIGH (ref 0.44–1.00)
GFR, Estimated: 13 mL/min — ABNORMAL LOW (ref >60)
Glucose, Bld: 99 mg/dL (ref 70–99)
Potassium: 4.7 mmol/L (ref 3.5–5.1)
Sodium: 129 mmol/L — ABNORMAL LOW (ref 135–145)
Total Bilirubin: 1.3 mg/dL — ABNORMAL HIGH (ref 0.3–1.2)
Total Protein: 8.9 g/dL — ABNORMAL HIGH (ref 6.5–8.1)

## 2020-04-14 LAB — TSH: TSH: 1.241 u[IU]/mL (ref 0.350–4.500)

## 2020-04-14 LAB — CBC
HCT: 43.5 % (ref 36.0–46.0)
Hemoglobin: 14.8 g/dL (ref 12.0–15.0)
MCH: 30.6 pg (ref 26.0–34.0)
MCHC: 34 g/dL (ref 30.0–36.0)
MCV: 90.1 fL (ref 80.0–100.0)
Platelets: 270 10*3/uL (ref 150–400)
RBC: 4.83 MIL/uL (ref 3.87–5.11)
RDW: 12 % (ref 11.5–15.5)
WBC: 8.6 10*3/uL (ref 4.0–10.5)
nRBC: 0 % (ref 0.0–0.2)

## 2020-04-14 LAB — PROTIME-INR
INR: 1 (ref 0.8–1.2)
Prothrombin Time: 12.6 seconds (ref 11.4–15.2)

## 2020-04-14 LAB — HIV ANTIBODY (ROUTINE TESTING W REFLEX): HIV Screen 4th Generation wRfx: NONREACTIVE

## 2020-04-14 MED ORDER — INFLUENZA VAC SPLIT QUAD 0.5 ML IM SUSY
0.5000 mL | PREFILLED_SYRINGE | INTRAMUSCULAR | Status: AC
  Administered 2020-04-15: 0.5 mL via INTRAMUSCULAR
  Filled 2020-04-14 (×2): qty 0.5

## 2020-04-14 MED ORDER — CALCIUM CARBONATE ANTACID 500 MG PO CHEW
500.0000 mg | CHEWABLE_TABLET | Freq: Four times a day (QID) | ORAL | Status: DC | PRN
  Administered 2020-04-14: 500 mg via ORAL
  Filled 2020-04-14: qty 3

## 2020-04-14 MED ORDER — CHLORHEXIDINE GLUCONATE CLOTH 2 % EX PADS
6.0000 | MEDICATED_PAD | Freq: Every day | CUTANEOUS | Status: DC
  Administered 2020-04-14 – 2020-04-18 (×5): 6 via TOPICAL

## 2020-04-14 NOTE — ED Notes (Signed)
Report given to [employer], Charity fundraiser.  Pt SBAR information covered.  Receiving nurse had no additional questions.

## 2020-04-14 NOTE — Consult Note (Signed)
Renal Service Consult Note [facility]  Kim Pacheco 04/14/2020 Kim Krabbe, MD Requesting Physician: Dr Rhona Leavens  Reason for Consult:  Renal failure  HPI: The patient is a 34 y.o. year-old w/ hx of UTI, depression, chronic hepatitis B admitted 11./04 yesterday for acute renal failure w/ creat 3.7 (b/l 0.84 Apr 10, 2020).  CT also showed worsening ascites compared to CT from 2020. Seen by GI, they felt that liver does look smaller / shrunken compared to prior, and ascites is worse. No other findings on imaging to suggest portal HTN.  Albumin is > 4, INR normal. Diag tap done and SAAG is high which is c/w portal HTN. GI recommending echo, [place] w/ doppler of HV and PV, AFP.  Pt took Viread for 7 yrs then stopped in April because she was "joining a gym and wanted to get off all my meds". HBV viral load was ordered. Asked to see for renal failure.   Pt seen in room, having hard time w/ bladder retention, 300 cc last checked, cannot void.  Diffuse ab dpain. No SOB. No fever, chills sweats. No hx jaundice.     ROS  denies CP  no joint pain   no HA  no blurry vision  no rash  no diarrhea  no nausea/ vomiting  no dysuria  no difficulty voiding  no change in urine color    Past Medical History  Past Medical History:  Diagnosis Date  . Abdominal pain    started in mid abd and spread to left side/on and off/since Jan 2020/ pt states, she has fluid in abd/unknown  . Abnormal Pap smear    normal 06/2013  . Anxiety   . Constipation    hx of  . Depression    suicide attempt in college  . Diabetes mellitus without complication (HCC)    gestational, no issues since pregnancy per her report - resolved  . Gestational diabetes    /no meds now - resolved  . Hepatitis    Hep B, managed by Dr. Kinnie Scales  . History of chicken pox   . History of UTI   . Vaginal Pap smear, abnormal    Past Surgical History  Past Surgical History:  Procedure Laterality Date  . CESAREAN  SECTION N/A 01/26/2013   Procedure: Primary Cesarean Section Delivery Baby  Boy @ 0017, Apgars 4/8/9;  Surgeon: Freddrick March. Tenny Craw, MD;  Location: [facility];  Service: Obstetrics;  Laterality: N/A;  . CESAREAN SECTION N/A 11/18/2017   Procedure: CESAREAN SECTION;  Surgeon: Olivia Mackie, MD;  Location: [facility]TH[facility] SU[facility];  Service: Obstetrics;  Laterality: N/A;  . COLONOSCOPY  2020  . ESOPHAGOGASTRODUODENOSCOPY ENDOSCOPY    . laproscopy  2020  . NASAL SINUS SURGERY  1996  . WISDOM TOOTH EXTRACTION     Family History  Family History  Adopted: Yes   Social History  reports that she quit smoking about 8 years ago. Her smoking use included cigarettes. She smoked 1.00 pack per day. She has never used smokeless tobacco. She reports current alcohol use of about 5.0 standard drinks of alcohol per week. She reports previous drug use. Drug: Marijuana. Allergies  Allergies  Allergen Reactions  . Contrast Media [Iodinated Diagnostic Agents] Anaphylaxis, Shortness Of Breath and Rash    IV contrast    Home medications Prior to Admission medications   Medication Sig Start Date End Date Taking? Authorizing Provider  docusate sodium (COLACE) 100 MG capsule Take 1 capsule (100 mg total)  by mouth 2 (two) times daily. 04/12/20  Yes Koberlein, Paris Lore, MD  HYDROcodone-acetaminophen (NORCO/VICODIN) 5-325 MG tablet Take 1 tablet by mouth every 6 (six) hours as needed. Patient taking differently: Take 1 tablet by mouth every 6 (six) hours as needed for moderate pain or severe pain.  04/10/20  Yes Linwood Dibbles, MD  ibuprofen (ADVIL) 200 MG tablet Take 200 mg by mouth every 6 (six) hours as needed for moderate pain.   Yes [provider]  naproxen (NAPROSYN) 375 MG tablet Take 1 tablet (375 mg total) by mouth 2 (two) times daily with a meal for 7 days. Patient taking differently: Take 375 mg by mouth 2 (two) times daily as needed for moderate pain.  04/07/20 04/14/20 Yes Walisiewicz, Kaitlyn E, PA-C  ondansetron  (ZOFRAN ODT) 4 MG disintegrating tablet Take 1 tablet (4 mg total) by mouth every 8 (eight) hours as needed for nausea or vomiting. 04/12/20  Yes Koberlein, Paris Lore, MD  polyethylene glycol (GOLYTELY) 236 g solution Drink 8 oz solution every 1-2 hours as needed for constipation 04/12/20  Yes Koberlein, Junell C, MD     Vitals:   04/14/20 0028 04/14/20 0431 04/14/20 0831 04/14/20 1442  BP: (!) 123/91 119/84 119/86 120/85  Pulse: 71 67 81 70  Resp: 18 15 20 20   Temp: 98.2 F (36.8 C) 98.4 F (36.9 C) 98.3 F (36.8 C) 99.1 F (37.3 C)  TempSrc: Oral Oral    SpO2: 99% 98% 97% 98%   Exam Gen alert, no distress, uncomfortable, holding abdomen No rash, cyanosis or gangrene Sclera anicteric, throat clear, slightly dry  No jvd or bruits, flat neck veins Chest clear bilat to bases no rales or wheezing RRR no MRG, quiet precordium Abd firm, mod distended, no fluid wave, bladder may be distended, nontender GU defer MS no joint effusions or deformity Ext no leg or UE edema, no wounds or ulcers Neuro is alert, Ox 3 , nf, no asterixis    Home meds:  - norco prn/ advil 200 qid prn/ naproxen 375 bid prn  - zofran prn/ colace qd    CT abd noncon 11/4 - Adrenals/Urinary Tract: Normal adrenal glands. No renal    stone or hydronephrosis. No perinephric edema. Urinary bladder is physiologically distended. There is a contour deformity of the bladder dome which may represent a bladder diverticulum, for example series 6, image 81. No bladder wall thickening.     CXR - normal CXR, no active disease   ECHO 11/5 - results pending     UA 11/4 - negative, 11-20 epis, 0-5 rbc/ wbc, rare bact   UNa 43,  UCr 73    Na 129  K 4.7  CO2 18  BUN 48  Cr 4.22  Alb 4.22, Tprot 10.2 > 8.9 (6.5- 8.1)     Hb 14.8  plt 270       Ascitic fluid alb <1.0, tnc 204 , 89% mono  Assessment/ Plan: 1. Renal failure - presumably acute, creat was 0.84 four days ago. Creat here 3.7 > 4.2.  UA negative, BP's normal to  low-normal, pt has some bladder retention and also ascites on exam, no LE edema though and normal chest, no JVD.  May have portal HTN per GI eval (long hx chron hep B, stopped meds in April). Has been taking nsaids. Suspect AKI due to combination dehydration, nsaids +/- portal HTN causing ATN.  Urine lytes suggest ATN > hepatorenal. Recommend place foley/ drain bladder, cont IVF at 100/hr,  supportive care, daily LFT"s/alb/ tprot levels. If total prot stays up will send off myeloma studies, otherwise would assume due to dehydration.  2. Abd pain - ascites on CT, no acute findings. Also urinary retention it appears.  3. Chronic hep B - off meds since April, per GI work-up in progress.       Vinson Moselle  MD 04/14/2020, 4:43 PM  Recent Labs  Lab 04/13/20 1830 04/14/20 0419  WBC 8.0 8.6  HGB 15.2* 14.8   Recent Labs  Lab 04/13/20 1830 04/14/20 0419  K 4.0 4.7  BUN 41* 48*  CREATININE 3.77* 4.22*  CALCIUM 9.3 8.7*

## 2020-04-14 NOTE — Consult Note (Addendum)
Referring Provider:  [facility]         Primary Care Physician:  Wynn Banker, MD Primary Gastroenterologist:  Wendall Papa, MD                We were asked to see this patient for:   ascites               ASSESSMENT / PLAN:   # Recurrent ascites in this patient with chronic hepatitis B. She had ascites in 2020 in the absence of cirrhosis on imaging.  At that time there was  insufficient fluid for diagnostic paracentesis.  Noncontrast CT scan this admission shows moderate to large volume ascites.  Borderline hepatic steatosis.  However, Dr. Chales Abrahams reviewed the scan and feels the liver does look shrunken compared to prior imaging. Except for ascites there are no findings of portal HTN on imaging. Interestingly her albumin is normal above 4,  INR is normal. She had a diagnostic tap yesterday, no SBP --Her SAAG is high c/w portal HTN.  --Obtain echocardiogram, [place] with doppler of HV and PV --AFP --Ascitic fluid "yellow" yesterday so not likely chyle bu will check fluid triglycerides. Also, adenosine deaminase ( for TB). Fluid LDH was sent yesterday and cytology is pending. I contacted lab,  there is insufficient ascitic fluid to add these tests. Given creatinine will hold off on repeat paracentesis, not even a diagnostic one at this point.  .  # Chronic hepatitis B. She stopped anti-viral treatement several months ago. She was exercises and didn't want t take any medications.  --obtain HBV viral load  # AKI. Cr 0.84 >> 3.77 >> 4.22 today. Etiology? Maybe secondary to NSAIDs. Discussed case with Hospitalist. Nephrology referral to be placed.    HPI:                                                                                                                             Chief Complaint: ascites  Kim Pacheco is a 34 y.o. female with a pmh significant for, not necessarily limited to: chronic HBV, alcoholism  In January 2020 patient was evaluated by Dr. Christella Hartigan for  abdominal pain.  CT scan in January 2020 (noncontrast) showed hepatic steatosis and small to moderate amount of ascites.  Imaging did not show cirrhosis. Patient was already being followed by [facility] and in October 2019 an ultrasound with elastography showed F0 F1 fibrosis score.  Given ongoing abdominal pain we sent her for diagnostic paracentesis.  If abdominal pain continued and fluid studies were negative then next that would be EGD, possibly colonoscopy.  Apparently there was insufficient fluid to perform a diagnostic paracentesis.  We proceed with an EGD which showed gastritis.  For further evaluation we schedule her for colonoscopy.  Colonoscopy with terminal ileum intubation July 2020 was normal  Patient recently developed constipation, generalized abdominal discomfort and abdominal swelling.  She had recently seen GYN but evaluation was apparently  unremarkable and pelvic US on 04/07/20 didn't reveal any acute findings. Patient saw her PCP was given something for constipation and was able to have a bowel movement but the abdominal discomfort and swelling did not improve.  She has been also having pain in her lower back.  No nausea, vomiting.  In the ED her white count was normal.  Hemoglobin normal.  Lipase and LFTs unremarkable.  Total protein mildly elevated. She was hyponatremic. Her Creatinine was 3.77, up from 0.84 a couple of days prior. KUB - moderate stool burden. Non-contrast CT scan remarkable for moderate to large volume abdominal and pelvic ascites.  No hemorrhagic component.  Borderline hepatic steatosis.  Pancreas unremarkable.  Spleen normal in size    PREVIOUS ENDOSCOPIC EVALUATIONS / PERTINENT STUDIES    Feb 2020 EGD for abdominal pain -gastritis  July 2020 Colonoscopy with terminal ileum intubation for evaluation of abdominal pain  --normal    Past Medical History:  Diagnosis Date  . Abdominal pain    started in mid abd and spread to left side/on and off/since  Jan 2020/ pt states, she has fluid in abd/unknown  . Abnormal Pap smear    normal 06/2013  . Anxiety   . Constipation    hx of  . Depression    suicide attempt in college  . Diabetes mellitus without complication (HCC)    gestational, no issues since pregnancy per her report - resolved  . Gestational diabetes    /no meds now - resolved  . Hepatitis    Hep B, managed by Dr. Kinnie Scales  . History of chicken pox   . History of UTI   . Vaginal Pap smear, abnormal     Past Surgical History:  Procedure Laterality Date  . CESAREAN SECTION N/A 01/26/2013   Procedure: Primary Cesarean Section Delivery Baby  Boy @ 0017, Apgars 4/8/9;  Surgeon: Freddrick March. Tenny Craw, MD;  Location: [facility];  Service: Obstetrics;  Laterality: N/A;  . CESAREAN SECTION N/A 11/18/2017   Procedure: CESAREAN SECTION;  Surgeon: Olivia Mackie, MD;  Location: [facility];  Service: Obstetrics;  Laterality: N/A;  . COLONOSCOPY  2020  . ESOPHAGOGASTRODUODENOSCOPY ENDOSCOPY    . laproscopy  2020  . NASAL SINUS SURGERY  1996  . WISDOM TOOTH EXTRACTION      Prior to Admission medications   Medication Sig Start Date End Date Taking? Authorizing Provider  docusate sodium (COLACE) 100 MG capsule Take 1 capsule (100 mg total) by mouth 2 (two) times daily. 04/12/20  Yes Koberlein, Paris Lore, MD  HYDROcodone-acetaminophen (NORCO/VICODIN) 5-325 MG tablet Take 1 tablet by mouth every 6 (six) hours as needed. Patient taking differently: Take 1 tablet by mouth every 6 (six) hours as needed for moderate pain or severe pain.  04/10/20  Yes Linwood Dibbles, MD  ibuprofen (ADVIL) 200 MG tablet Take 200 mg by mouth every 6 (six) hours as needed for moderate pain.   Yes [provider]  naproxen (NAPROSYN) 375 MG tablet Take 1 tablet (375 mg total) by mouth 2 (two) times daily with a meal for 7 days. Patient taking differently: Take 375 mg by mouth 2 (two) times daily as needed for moderate pain.  04/07/20 04/14/20 Yes Walisiewicz, Kaitlyn  E, PA-C  ondansetron (ZOFRAN ODT) 4 MG disintegrating tablet Take 1 tablet (4 mg total) by mouth every 8 (eight) hours as needed for nausea or vomiting. 04/12/20  Yes Koberlein, Junell C, MD  polyethylene glycol (GOLYTELY) 236 g solution Drink 8 oz  solution every 1-2 hours as needed for constipation 04/12/20  Yes Koberlein, Paris LoreJunell C, MD    Current Facility-Administered Medications  Medication Dose Route Frequency Provider Last Rate Last Admin  . 0.9 %  sodium chloride infusion   Intravenous Continuous Osvaldo ShipperKrishnan, Gokul, MD 100 mL/hr at 04/14/20 0458 New Bag at 04/14/20 0458  . acetaminophen (TYLENOL) tablet 650 mg  650 mg Oral Q6H PRN Osvaldo ShipperKrishnan, Gokul, MD       Or  . acetaminophen (TYLENOL) suppository 650 mg  650 mg Rectal Q6H PRN Osvaldo ShipperKrishnan, Gokul, MD      . heparin injection 5,000 Units  5,000 Units Subcutaneous Q8H Osvaldo ShipperKrishnan, Gokul, MD      . Melene Muller[START ON 04/15/2020] influenza vac split quadrivalent PF (FLUARIX) injection 0.5 mL  0.5 mL Intramuscular Tomorrow-1000 Osvaldo ShipperKrishnan, Gokul, MD      . morphine 2 MG/ML injection 2 mg  2 mg Intravenous Q4H PRN Osvaldo ShipperKrishnan, Gokul, MD   2 mg at 04/14/20 0932  . ondansetron (ZOFRAN) tablet 4 mg  4 mg Oral Q6H PRN Osvaldo ShipperKrishnan, Gokul, MD       Or  . ondansetron [facility](ZOFRAN) injection 4 mg  4 mg Intravenous Q6H PRN Osvaldo ShipperKrishnan, Gokul, MD   4 mg at 04/14/20 0035  . oxyCODONE (Oxy IR/ROXICODONE) immediate release tablet 5 mg  5 mg Oral Q4H PRN Osvaldo ShipperKrishnan, Gokul, MD        Allergies as of 04/13/2020 - Review Complete 04/13/2020  Allergen Reaction Noted  . Contrast media [iodinated diagnostic agents] Anaphylaxis, Shortness Of Breath, and Rash 06/26/2018    Family History  Adopted: Yes    Social History   Socioeconomic History  . Marital status: Married    Spouse name: Not on file  . Number of children: 1  . Years of education: college  . Highest education level: Not on file  Occupational History  . Occupation: Environmental managerphotographer  Tobacco Use  . Smoking status: Former Smoker     Packs/day: 1.00    Types: Cigarettes    Quit date: 07/23/2011    Years since quitting: 8.7  . Smokeless tobacco: Never Used  . Tobacco comment: smoked for 8 years, quit in 2013  Vaping Use  . Vaping Use: Never used  Substance and Sexual Activity  . Alcohol use: Yes    Alcohol/week: 5.0 standard drinks    Types: 5 Glasses of wine per week  . Drug use: Not Currently    Types: Marijuana    Comment: Last use 12/19/18  . Sexual activity: Yes  Other Topics Concern  . 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 Difficulty of Paying Living Expenses: Not on file  Food Insecurity:   . Worried About Programme researcher, broadcasting/film/videounning Out of Food in the Last Year: Not on file  . Ran Out of Food in the Last Year: Not on file  Transportation Needs:   . Lack of Transportation (Medical): Not on file  . Lack of Transportation (Non-Medical): Not on file  Physical Activity:   . Days of Exercise per Week: Not on file  . Minutes of Exercise per Session: Not on file  Stress:   . Feeling of Stress :  Not on file  Social Connections:   . Frequency of Communication with Friends and Family: Not on file  . Frequency of Social Gatherings with Friends and Family: Not on file  . Attends Religious Services: Not on file  . Active Member of Clubs or Organizations: Not on file  . Attends Banker Meetings: Not on file  . Marital Status: Not on file  Intimate Partner Violence:   . Fear of Current or Ex-Partner: Not on file  . Emotionally Abused: Not on file  . Physically Abused: Not on file  . Sexually Abused: Not on file    Review of Systems: All systems reviewed and negative except where noted in HPI.   OBJECTIVE:    Physical Exam: Vital signs in  last 24 hours: Temp:  [98.2 F (36.8 C)-98.4 F (36.9 C)] 98.3 F (36.8 C) (11/05 0831) Pulse Rate:  [59-87] 81 (11/05 0831) Resp:  [14-20] 20 (11/05 0831) BP: (115-128)/(84-98) 119/86 (11/05 0831) SpO2:  [96 %-100 %] 97 % (11/05 0831) Last BM Date: 04/13/20 General:   Alert, well-developed, Asian female in NAD Psych:  Pleasant, cooperative. Normal mood and affect. Eyes:  Pupils equal, sclera clear, no icterus.   Conjunctiva pink. Ears:  Normal auditory acuity. Nose:  No deformity, discharge,  or lesions. Neck:  Supple; no masses Lungs:  Clear throughout to auscultation.   No wheezes, crackles, or rhonchi.  Heart:  Regular rate and rhythm;  no lower extremity edema Abdomen:  Soft but mild-moderately distended.  BS active, no palp mass   Rectal:  Deferred  Msk:  Symmetrical without gross deformities. . Neurologic:  Alert and  oriented x4;  grossly normal neurologically. Skin:  Intact without significant lesions or rashes.    Scheduled inpatient medications . heparin  5,000 Units Subcutaneous Q8H  . [START ON 04/15/2020] influenza vac split quadrivalent PF  0.5 mL Intramuscular Tomorrow-1000      Intake/Output from previous day: 11/04 0701 - 11/05 0700 In: 1896.9 [I.V.:896.9; IV Piggyback:1000] Out: -  Intake/Output this shift: No intake/output data recorded.   Lab Results: Recent Labs    04/13/20 1830 04/14/20 0419  WBC 8.0 8.6  HGB 15.2* 14.8  HCT 43.8 43.5  PLT 270 270   BMET Recent Labs    04/13/20 1830 04/14/20 0419  NA 130* 129*  K 4.0 4.7  CL 91* 97*  CO2 24 18*  GLUCOSE 102* 99  BUN 41* 48*  CREATININE 3.77* 4.22*  CALCIUM 9.3 8.7*   LFT Recent Labs    04/14/20 0419  PROT 8.9*  ALBUMIN 4.6  AST 23  ALT 30  ALKPHOS 59  BILITOT 1.3*   PT/INR Recent Labs    04/14/20 0419  LABPROT 12.6  INR 1.0   Hepatitis Panel No results for input(s): HEPBSAG, HCVAB, HEPAIGM, HEPBIGM in the last 72 hours.   . CBC Latest Ref Rng & Units  04/14/2020 04/13/2020 04/10/2020  WBC 4.0 - 10.5 K/uL 8.6 8.0 5.8  Hemoglobin 12.0 - 15.0 g/dL 03.4 15.2(H) 14.1  Hematocrit 36 - 46 % 43.5 43.8 42.5  Platelets 150 - 400 K/uL 270 270 229    . CMP Latest Ref Rng & Units 04/14/2020 04/13/2020 04/10/2020  Glucose 70 - 99 mg/dL 99 742(V) 956(L)  BUN 6 - 20 mg/dL 87(F) 64(P) 15  Creatinine 0.44 - 1.00 mg/dL 3.29(J) 1.88(C) 1.66  Sodium 135 - 145 mmol/L 129(L) 130(L) 139  Potassium 3.5 - 5.1 mmol/L 4.7 4.0 4.4  Chloride 98 - 111  mmol/L 97(L) 91(L) 103  CO2 22 - 32 mmol/L 18(L) 24 26  Calcium 8.9 - 10.3 mg/dL 5.4(U) 9.3 9.5  Total Protein 6.5 - 8.1 g/dL 8.9(H) 10.2(H) 8.2(H)  Total Bilirubin 0.3 - 1.2 mg/dL 9.8(J) 1.9(J) 1.0  Alkaline Phos 38 - 126 U/L 59 60 47  AST 15 - 41 U/L ALT 0 - 44 U/L 30 31 37   Studies/Results: CT ABDOMEN PELVIS WO CONTRAST  Result Date: 04/13/2020 CLINICAL DATA:  Acute severe nonlocalized abdominal pain with rebound tenderness. Concern for acute abdomen. Recent diagnosis of ruptured ovarian cyst. EXAM: CT ABDOMEN AND PELVIS WITHOUT CONTRAST TECHNIQUE: Multidetector CT imaging of the abdomen and pelvis was performed following the standard protocol without IV contrast. COMPARISON:  Abdominal CT and pelvic ultrasound 6 days ago 04/07/2020. CT 06/26/2018 also reviewed. FINDINGS: Lower chest: The lung bases are clear. No focal consolidation or pleural effusion Hepatobiliary: No focal liver abnormality is seen. Borderline hepatic steatosis. No gallstones, gallbladder wall thickening, or biliary dilatation. Pancreas: No ductal dilatation or inflammation. Spleen: Normal in size without focal abnormality. Adrenals/Urinary Tract: Normal adrenal glands. No renal stone or hydronephrosis. No perinephric edema. Urinary bladder is physiologically distended. There is a contour deformity of the bladder dome which may represent a bladder diverticulum, for example series 6, image 81. No bladder wall thickening. Stomach/Bowel: Bowel  evaluation is limited in the absence of contrast and presence of intra-abdominal ascites. Ingested material within the stomach. No obvious gastric wall thickening. Decompressed small bowel without inflammation or obvious wall thickening. Liquid stool in the colon. No formed stool. The appendix is not well seen on the current exam. No evidence of appendicitis. Vascular/Lymphatic: Abdominal aorta is normal in caliber. Duplicated IVC. No adenopathy. Reproductive: Uterus is unremarkable. Ovaries tentatively visualized and symmetric in size. No adnexal mass. Other: Moderate to large volume of abdominal and pelvic ascites, progressed from recent exam. Ascites was also seen on January 2020 CT. The etiology is indeterminate. Ascites measures simple fluid density. There is no hemorrhagic component. No free air. Musculoskeletal: There are no acute or suspicious osseous abnormalities. IMPRESSION: 1. Moderate to large volume of abdominal and pelvic ascites, progressed from recent exam. Cause for ascites is not identified. There is no hemorrhagic component nor free air. Consider fluid sampling. 2. Contour deformity of the bladder dome may represent a bladder diverticulum. 3. Duplicated IVC. Electronically Signed   By: Narda Rutherford M.D.   On: 04/13/2020 20:02   DG Chest 2 View  Result Date: 04/13/2020 CLINICAL DATA:  Shortness of breath EXAM: CHEST - 2 VIEW COMPARISON:  08/05/2014 FINDINGS: Cardiac shadow is within normal limits. The lungs are well aerated bilaterally. No focal infiltrate or sizable effusion is seen. No acute bony abnormality is noted. IMPRESSION: No active cardiopulmonary disease. Electronically Signed   By: Alcide Clever M.D.   On: 04/13/2020 18:44   DG Abd 2 Views  Result Date: 04/12/2020 CLINICAL DATA:  Abdominal pain for 5 days. EXAM: ABDOMEN - 2 VIEW COMPARISON:  Abdominal CT and pelvic ultrasound 04/07/2020, 5 days ago. FINDINGS: No free intra-abdominal air. No bowel dilatation to suggest  obstruction. There is a moderate volume of stool in the ascending, transverse, and descending colon. No abnormal rectal distension. Pelvic calcifications are unchanged from prior exam and consistent with phleboliths. No radiopaque calculi. The lung bases are clear. No osseous abnormalities are seen. IMPRESSION: Normal bowel gas pattern. Moderate colonic stool burden. Electronically Signed   By: Ivette Loyal.D.  On: 04/12/2020 15:28    Principal Problem:   ARF (acute renal failure) (HCC) Active Problems:   Hepatitis B   Ascites   Abdominal pain    Willette Cluster, NP-C @  04/14/2020, 10:14 AM   Attending physician's note   I have taken an interval history, reviewed the chart and examined the patient. I agree with the Advanced Practitioner's note, impression and recommendations.   34yr adopted patient with  Recurrent ascites. ?etiology. SAAG>1.1 c/w portal hypertension.  However no other signs of cirrhosis.  Comparing CTs, liver does look somewhat shrunken but without nodularity.  Chronic HBV vs chronic carrier - but has been on tenofovir.  Stopped on her own.  Followed by Annamarie Major, last seen 09/2019.  She told me that she had at "since birth".  AKI ?  Etiology. Cr 4.22 with GFR 10 mL/min.  Plan: -Doppler ultrasound of liver to r/o Budd-Chiari syndrome. -2DE to r/o R CHF. -Renal consultation ASAP -Check AFP. -Review of serial CTs with Dr. Irish Lack. -Obtain last note from [facility] and previous extensive work-up -Low-salt renal diet for now. -Hold off on liver biopsy at the present time. -Once above is back, we will also discussed with Dr. Tressia Danas.    Edman Circle, MD [facility] [phone]

## 2020-04-14 NOTE — Telephone Encounter (Signed)
She's in hospital now. Noted.

## 2020-04-14 NOTE — Progress Notes (Signed)
  Echocardiogram 2D Echocardiogram has been performed.  Leta Jungling M 04/14/2020, 2:06 PM

## 2020-04-14 NOTE — Progress Notes (Signed)
PROGRESS NOTE    Kim Pacheco  CXK:[id number] DOB: 11/29/1985 DOA: 04/13/2020 PCP: Wynn Banker, MD    Brief Narrative:  34yo with hx of hx hep B, depression, ETOH abuse (last intake was one week prior to visit), constipation who presented with increasing abd girth, found to have symptomatic ascites and worsening renal failure  Assessment & Plan:   Principal Problem:   ARF (acute renal failure) (HCC) Active Problems:   Hepatitis B   Ascites   Abdominal pain   1. Acute renal failure/hyponatremia:  -Creatinine was 0.84 on November 1, rising to 3.77 on day of admission -Cr up to over 4 today -Have consulted Nephrology for assistance, appreciate recs.  -Concern for possible dehydration with underlying ATN from NSAIDS with portal HTN  -Bladder scan with >300cc noted. Indwelling cath placed per Nephrology -Continue ivf hydration -Repeat bmet in AM  2. Ascites:  -Uncertain etiology -SAAG noted to be >1.1, suggesting underlying portal hypertension in the setting of ETOH use and hep B -Appreciate input by GI. Recommendation for doppler liver to r/o Budd-Chiari -2d echo ordered and performed, pending results -check AFP levels -continue to avoid hepatotoxic agents -Will repeat cmp in AM  3. History of alcohol abuse:  -Pt had noted drinking 3 to 4 glasses of wine daily, last intake was one week prior -Cont to monitor for withdrawal symptoms, although pt is outside withdrawal window at this time  4.  History of hepatitis B:  -Reportedly had [facility]   -Pt reports not have taken antiviral previously.   DVT prophylaxis: Heparin subq Code Status: Full Family Communication: Pt in room, family not at bedside  Status is: Inpatient  Remains inpatient appropriate because:Unsafe d/c plan, IV treatments appropriate due to intensity of illness or inability to take PO and Inpatient level of care appropriate due to severity of  illness   Dispo: The patient is from: Home              Anticipated d/c is to: Home              Anticipated d/c date is: > 3 days              Patient currently is not medically stable to d/c.   Consultants:   GI  Nephrology  Procedures:     Antimicrobials: Anti-infectives (From admission, onward)   None       Subjective: Complaining of mild abd fullness. Reports only little urine output this AM  Objective: Vitals:   04/14/20 0028 04/14/20 0431 04/14/20 0831 04/14/20 1442  BP: (!) 123/91 119/84 119/86 120/85  Pulse: 71 67 81 70  Resp: 18 15 20 20   Temp: 98.2 F (36.8 C) 98.4 F (36.9 C) 98.3 F (36.8 C) 99.1 F (37.3 C)  TempSrc: Oral Oral    SpO2: 99% 98% 97% 98%    Intake/Output Summary (Last 24 hours) at 04/14/2020 1740 Last data filed at 04/14/2020 1600 Gross per 24 hour  Intake 2970.97 ml  Output --  Net 2970.97 ml   There were no vitals filed for this visit.  Examination:  General exam: Appears calm and comfortable  Respiratory system: Clear to auscultation. Respiratory effort normal. Cardiovascular system: S1 & S2 heard, Regular Gastrointestinal system: Abdomen distended, ascitic, pos BS Central nervous system: Alert and oriented. No focal neurological deficits. Extremities: Symmetric 5 x 5 power. Skin: No rashes, lesions Psychiatry: Judgement and insight appear normal. Mood & affect appropriate.  Data Reviewed: I have personally reviewed following labs and imaging studies  CBC: Recent Labs  Lab 04/10/20 1153 04/13/20 1830 04/14/20 0419  WBC 5.8 8.0 8.6  NEUTROABS  --  4.9  --   HGB 14.1 15.2* 14.8  HCT 42.5 43.8 43.5  MCV 93.2 89.9 90.1  PLT 229 270 270   Basic Metabolic Panel: Recent Labs  Lab 04/10/20 1153 04/13/20 1830 04/14/20 0419  NA 139 130* 129*  K 4.4 4.0 4.7  CL 103 91* 97*  CO2 26 24 18*  GLUCOSE 133* 102* 99  BUN 15 41* 48*  CREATININE 0.84 3.77* 4.22*  CALCIUM 9.5 9.3 8.7*   GFR: Estimated Creatinine  Clearance: 15 mL/min (A) (by C-G formula based on SCr of 4.22 mg/dL (H)). Liver Function Tests: Recent Labs  Lab 04/10/20 1153 04/13/20 1830 04/14/20 0419  AST 24 22 23   ALT 37 31 30  ALKPHOS 47 60 59  BILITOT 1.0 1.5* 1.3*  PROT 8.2* 10.2* 8.9*  ALBUMIN 4.4 5.2* 4.6   Recent Labs  Lab 04/10/20 1153 04/13/20 1830  LIPASE 32 28   No results for input(s): AMMONIA in the last 168 hours. Coagulation Profile: Recent Labs  Lab 04/14/20 0419  INR 1.0   Cardiac Enzymes: No results for input(s): CKTOTAL, CKMB, CKMBINDEX, TROPONINI in the last 168 hours. BNP (last 3 results) No results for input(s): PROBNP in the last 8760 hours. HbA1C: No results for input(s): HGBA1C in the last 72 hours. CBG: No results for input(s): GLUCAP in the last 168 hours. Lipid Profile: No results for input(s): CHOL, HDL, LDLCALC, TRIG, CHOLHDL, LDLDIRECT in the last 72 hours. Thyroid Function Tests: Recent Labs    04/14/20 0419  TSH 1.241   Anemia Panel: No results for input(s): VITAMINB12, FOLATE, FERRITIN, TIBC, IRON, RETICCTPCT in the last 72 hours. Sepsis Labs: No results for input(s): PROCALCITON, LATICACIDVEN in the last 168 hours.  Recent Results (from the past 240 hour(s))  Body fluid culture     Status: None (Preliminary result)   Collection Time: 04/13/20  9:10 PM   Specimen: Peritoneal Cavity; Peritoneal Fluid  Result Value Ref Range Status   Specimen Description   Final    PERITONEAL CAVITY Performed at [facility], [address] [place], [place]    Special Requests   Final    NONE Performed at [facility], [address], [place], [place]    Gram Stain   Final    WBC PRESENT, PREDOMINANTLY PMN NO ORGANISMS SEEN CYTOSPIN SMEAR Performed at [facility], [address] [place], [place]    Culture PENDING  Incomplete   Report Status PENDING  Incomplete  Respiratory Panel by RT PCR (Flu A&B,  Covid) - Nasopharyngeal Swab     Status: None   Collection Time: 04/13/20 10:24 PM   Specimen: Nasopharyngeal Swab  Result Value Ref Range Status   SARS Coronavirus 2 by RT PCR NEGATIVE NEGATIVE Final    Comment: (NOTE) SARS-CoV-2 target nucleic acids are NOT DETECTED.  The SARS-CoV-2 RNA is generally detectable in upper respiratoy specimens during the acute phase of infection. The lowest concentration of SARS-CoV-2 viral copies this assay can detect is 131 copies/mL. A negative result does not preclude SARS-Cov-2 infection and should not be used as the sole basis for treatment or other patient management decisions. A negative result may occur with  improper specimen collection/handling, submission of specimen other than nasopharyngeal swab, presence of viral  mutation(s) within the areas targeted by this assay, and inadequate number of viral copies (<131 copies/mL). A negative result must be combined with clinical observations, patient history, and epidemiological information. The expected result is Negative.  Fact Sheet for Patients:  https://www.moore.com/https://www.fda.gov/media/142436/download  Fact Sheet for Healthcare Providers:  https://www.young.biz/https://www.fda.gov/media/142435/download  This test is no t yet approved or cleared by the [place] FDA and  has been authorized for detection and/or diagnosis of SARS-CoV-2 by FDA under an Emergency Use Authorization (EUA). This EUA will remain  in effect (meaning this test can be used) for the duration of the COVID-19 declaration under Section 564(b)(1) of the Act, 21 U.S.C. section 360bbb-3(b)(1), unless the authorization is terminated or revoked sooner.     Influenza A by PCR NEGATIVE NEGATIVE Final   Influenza B by PCR NEGATIVE NEGATIVE Final    Comment: (NOTE) The Xpert Xpress SARS-CoV-2/FLU/RSV assay is intended as an aid in  the diagnosis of influenza from Nasopharyngeal swab specimens and  should not be used as a sole basis for treatment. Nasal  washings and  aspirates are unacceptable for Xpert Xpress SARS-CoV-2/FLU/RSV  testing.  Fact Sheet for Patients: https://www.moore.com/https://www.fda.gov/media/142436/download  Fact Sheet for Healthcare Providers: https://www.young.biz/https://www.fda.gov/media/142435/download  This test is not yet approved or cleared by the [place] FDA and  has been authorized for detection and/or diagnosis of SARS-CoV-2 by  FDA under an Emergency Use Authorization (EUA). This EUA will remain  in effect (meaning this test can be used) for the duration of the  Covid-19 declaration under Section 564(b)(1) of the Act, 21  U.S.C. section 360bbb-3(b)(1), unless the authorization is  terminated or revoked. Performed at [facility], [address] [place], [place] [address]      Radiology Studies: CT ABDOMEN PELVIS WO CONTRAST  Result Date: 04/13/2020 CLINICAL DATA:  Acute severe nonlocalized abdominal pain with rebound tenderness. Concern for acute abdomen. Recent diagnosis of ruptured ovarian cyst. EXAM: CT ABDOMEN AND PELVIS WITHOUT CONTRAST TECHNIQUE: Multidetector CT imaging of the abdomen and pelvis was performed following the standard protocol without IV contrast. COMPARISON:  Abdominal CT and pelvic ultrasound 6 days ago 04/07/2020. CT 06/26/2018 also reviewed. FINDINGS: Lower chest: The lung bases are clear. No focal consolidation or pleural effusion Hepatobiliary: No focal liver abnormality is seen. Borderline hepatic steatosis. No gallstones, gallbladder wall thickening, or biliary dilatation. Pancreas: No ductal dilatation or inflammation. Spleen: Normal in size without focal abnormality. Adrenals/Urinary Tract: Normal adrenal glands. No renal stone or hydronephrosis. No perinephric edema. Urinary bladder is physiologically distended. There is a contour deformity of the bladder dome which may represent a bladder diverticulum, for example series 6, image 81. No bladder wall thickening. Stomach/Bowel: Bowel evaluation  is limited in the absence of contrast and presence of intra-abdominal ascites. Ingested material within the stomach. No obvious gastric wall thickening. Decompressed small bowel without inflammation or obvious wall thickening. Liquid stool in the colon. No formed stool. The appendix is not well seen on the current exam. No evidence of appendicitis. Vascular/Lymphatic: Abdominal aorta is normal in caliber. Duplicated IVC. No adenopathy. Reproductive: Uterus is unremarkable. Ovaries tentatively visualized and symmetric in size. No adnexal mass. Other: Moderate to large volume of abdominal and pelvic ascites, progressed from recent exam. Ascites was also seen on January 2020 CT. The etiology is indeterminate. Ascites measures simple fluid density. There is no hemorrhagic component. No free air. Musculoskeletal: There are no acute or suspicious osseous abnormalities. IMPRESSION: 1. Moderate to large volume of abdominal and pelvic ascites, progressed  from recent exam. Cause for ascites is not identified. There is no hemorrhagic component nor free air. Consider fluid sampling. 2. Contour deformity of the bladder dome may represent a bladder diverticulum. 3. Duplicated IVC. Electronically Signed   By: Narda Rutherford M.D.   On: 04/13/2020 20:02   DG Chest 2 View  Result Date: 04/13/2020 CLINICAL DATA:  Shortness of breath EXAM: CHEST - 2 VIEW COMPARISON:  08/05/2014 FINDINGS: Cardiac shadow is within normal limits. The lungs are well aerated bilaterally. No focal infiltrate or sizable effusion is seen. No acute bony abnormality is noted. IMPRESSION: No active cardiopulmonary disease. Electronically Signed   By: Alcide Clever M.D.   On: 04/13/2020 18:44    Scheduled Meds: . heparin  5,000 Units Subcutaneous Q8H  . [START ON 04/15/2020] influenza vac split quadrivalent PF  0.5 mL Intramuscular Tomorrow-1000   Continuous Infusions: . sodium chloride 100 mL/hr at 04/14/20 1425     LOS: 1 day   Rickey Barbara,  MD [facility] Pager On Amion  If 7PM-7AM, please contact night-coverage 04/14/2020, 5:40 PM

## 2020-04-15 ENCOUNTER — Inpatient Hospital Stay (HOSPITAL_COMMUNITY): Payer: BC Managed Care – PPO

## 2020-04-15 DIAGNOSIS — R188 Other ascites: Secondary | ICD-10-CM | POA: Diagnosis not present

## 2020-04-15 DIAGNOSIS — N170 Acute kidney failure with tubular necrosis: Principal | ICD-10-CM

## 2020-04-15 DIAGNOSIS — Z87898 Personal history of other specified conditions: Secondary | ICD-10-CM

## 2020-04-15 DIAGNOSIS — R1010 Upper abdominal pain, unspecified: Secondary | ICD-10-CM | POA: Diagnosis not present

## 2020-04-15 HISTORY — DX: Personal history of other specified conditions: Z87.898

## 2020-04-15 LAB — MAGNESIUM: Magnesium: 2.1 mg/dL (ref 1.7–2.4)

## 2020-04-15 LAB — COMPREHENSIVE METABOLIC PANEL
ALT: 23 U/L (ref 0–44)
AST: 16 U/L (ref 15–41)
Albumin: 3.7 g/dL (ref 3.5–5.0)
Alkaline Phosphatase: 46 U/L (ref 38–126)
Anion gap: 11 (ref 5–15)
BUN: 28 mg/dL — ABNORMAL HIGH (ref 6–20)
CO2: 19 mmol/L — ABNORMAL LOW (ref 22–32)
Calcium: 8.7 mg/dL — ABNORMAL LOW (ref 8.9–10.3)
Chloride: 106 mmol/L (ref 98–111)
Creatinine, Ser: 1.81 mg/dL — ABNORMAL HIGH (ref 0.44–1.00)
GFR, Estimated: 37 mL/min — ABNORMAL LOW (ref >60)
Glucose, Bld: 93 mg/dL (ref 70–99)
Potassium: 4.2 mmol/L (ref 3.5–5.1)
Sodium: 136 mmol/L (ref 135–145)
Total Bilirubin: 1.3 mg/dL — ABNORMAL HIGH (ref 0.3–1.2)
Total Protein: 7.4 g/dL (ref 6.5–8.1)

## 2020-04-15 LAB — CBC
HCT: 38.1 % (ref 36.0–46.0)
Hemoglobin: 12.9 g/dL (ref 12.0–15.0)
MCH: 30.8 pg (ref 26.0–34.0)
MCHC: 33.9 g/dL (ref 30.0–36.0)
MCV: 90.9 fL (ref 80.0–100.0)
Platelets: 231 10*3/uL (ref 150–400)
RBC: 4.19 MIL/uL (ref 3.87–5.11)
RDW: 12.1 % (ref 11.5–15.5)
WBC: 6.3 10*3/uL (ref 4.0–10.5)
nRBC: 0 % (ref 0.0–0.2)

## 2020-04-15 LAB — HEPATITIS B DNA, ULTRAQUANTITATIVE, PCR

## 2020-04-15 LAB — AFP TUMOR MARKER: AFP, Serum, Tumor Marker: 7.9 ng/mL (ref 0.0–8.3)

## 2020-04-15 LAB — HBV REAL-TIME PCR, QUANT
HBV AS IU/ML: 605000000 IU/mL
LOG10 HBV AS IU/ML: 8.782 log10 IU/mL

## 2020-04-15 NOTE — Progress Notes (Addendum)
Progress Note  Chief Complaint:  ascites     ASSESSMENT / PLAN:    # Recurrent ascites in this patient with chronic hepatitis B. Followed by [facility] though appears she hasn't been seen there since May 2020. She had ascites without cirrhosis on CT scan in 2020. Noncontrast CT scan this admission shows also shows moderate to large volume ascites without cirrhosis.  However, Dr. Chales Abrahams reviewed the scan and feels the liver does look shrunken compared to prior imaging. Except for ascites there are no findings of portal HTN on imaging.  INR is normal. Dx tap yesterday, no SBP.  --Her SAAG is high c/w portal HTN.  --Echocardiogram>>> large pleural effusion.  --[place] with liver doppler in progress now --AFP normal.  --Ascitic fluid "yellow" so not likely chyle but when able to repeat diagnostic tap will check fluid triglycerides. Also, adenosine deaminase ( for TB). Fluid LDH is normal. Cytology is pending --Of note, reviewed Care Everywhere records. She underwent exp lap in 2020 for evaluation of ascites and elevated CA 125. No findings of malignancy.  .  # Chronic hepatitis B. She stopped anti-viral treatement several months ago. She was exercises and didn't want t take any medications. Reviewed notes from [facility] and there is a history of variable compliance to HBV medication.  --obtain HBV viral load  # AKI. Cr 4.22 yesterday, down to 1.81 today. Appreciate Nephrology's assistance. AKI was felt to be secondary to volume depletion + NSAIDS.           SUBJECTIVE:   Lower abdominal discomfort better after foley placed yesterday    OBJECTIVE:    Scheduled inpatient medications:  . Chlorhexidine Gluconate Cloth  6 each Topical Daily  . heparin  5,000 Units Subcutaneous Q8H  . influenza vac split quadrivalent PF  0.5 mL Intramuscular Tomorrow-1000   Continuous inpatient infusions:  . sodium chloride 100 mL/hr at 04/15/20 0513   PRN inpatient medications:  acetaminophen **OR** acetaminophen, calcium carbonate, morphine injection, ondansetron **OR** ondansetron (ZOFRAN) IV, oxyCODONE  Vital signs in last 24 hours: Temp:  [98.3 F (36.8 C)-99.1 F (37.3 C)] 98.8 F (37.1 C) (11/06 0438) Pulse Rate:  [70-73] 73 (11/06 0438) Resp:  [18-20] 18 (11/06 0438) BP: (91-128)/(55-95) 91/55 (11/06 0438) SpO2:  [97 %-100 %] 97 % (11/06 0438) Weight:  [61.2 kg] 61.2 kg (11/06 0500) Last BM Date: 04/13/20  Intake/Output Summary (Last 24 hours) at 04/15/2020 0957 Last data filed at 04/15/2020 0513 Gross per 24 hour  Intake 2197.38 ml  Output 2400 ml  Net -202.62 ml     Physical Exam:  . General: Alert, female in NAD . Heart:  Regular rate and rhythm. No lower extremity edema . Pulmonary: Normal respiratory effort . Abdomen: Soft, mild-mod distended, nontender. Normal bowel sounds.  . Neurologic: Alert and oriented . Psych: Pleasant. Cooperative.   Filed Weights   04/15/20 0500  Weight: 61.2 kg    Intake/Output from previous day: 11/05 0701 - 11/06 0700 In: 2197.4 [P.O.:300; I.V.:1897.4] Out: 2400 [Urine:2400] Intake/Output this shift: No intake/output data recorded.    Lab Results: Recent Labs    04/13/20 1830 04/14/20 0419 04/15/20 0520  WBC 8.0 8.6 6.3  HGB 15.2* 14.8 12.9  HCT 43.8 43.5 38.1  PLT 270 270 231   BMET Recent Labs    04/13/20 1830 04/14/20 0419 04/15/20 0520  NA 130* 129* 136  K 4.0 4.7 4.2  CL 91* 97* 106  CO2 24 18* 19*  GLUCOSE 102* 99 93  BUN 41* 48* 28*  CREATININE 3.77* 4.22* 1.81*  CALCIUM 9.3 8.7* 8.7*   LFT Recent Labs    04/15/20 0520  PROT 7.4  ALBUMIN 3.7  AST 16  ALT 23  ALKPHOS 46  BILITOT 1.3*   PT/INR Recent Labs    04/14/20 0419  LABPROT 12.6  INR 1.0   Hepatitis Panel No results for input(s): HEPBSAG, HCVAB, HEPAIGM, HEPBIGM in the last 72 hours.  CT ABDOMEN PELVIS WO CONTRAST  Result Date: 04/13/2020 CLINICAL DATA:  Acute severe nonlocalized abdominal pain  with rebound tenderness. Concern for acute abdomen. Recent diagnosis of ruptured ovarian cyst. EXAM: CT ABDOMEN AND PELVIS WITHOUT CONTRAST TECHNIQUE: Multidetector CT imaging of the abdomen and pelvis was performed following the standard protocol without IV contrast. COMPARISON:  Abdominal CT and pelvic ultrasound 6 days ago 04/07/2020. CT 06/26/2018 also reviewed. FINDINGS: Lower chest: The lung bases are clear. No focal consolidation or pleural effusion Hepatobiliary: No focal liver abnormality is seen. Borderline hepatic steatosis. No gallstones, gallbladder wall thickening, or biliary dilatation. Pancreas: No ductal dilatation or inflammation. Spleen: Normal in size without focal abnormality. Adrenals/Urinary Tract: Normal adrenal glands. No renal stone or hydronephrosis. No perinephric edema. Urinary bladder is physiologically distended. There is a contour deformity of the bladder dome which may represent a bladder diverticulum, for example series 6, image 81. No bladder wall thickening. Stomach/Bowel: Bowel evaluation is limited in the absence of contrast and presence of intra-abdominal ascites. Ingested material within the stomach. No obvious gastric wall thickening. Decompressed small bowel without inflammation or obvious wall thickening. Liquid stool in the colon. No formed stool. The appendix is not well seen on the current exam. No evidence of appendicitis. Vascular/Lymphatic: Abdominal aorta is normal in caliber. Duplicated IVC. No adenopathy. Reproductive: Uterus is unremarkable. Ovaries tentatively visualized and symmetric in size. No adnexal mass. Other: Moderate to large volume of abdominal and pelvic ascites, progressed from recent exam. Ascites was also seen on January 2020 CT. The etiology is indeterminate. Ascites measures simple fluid density. There is no hemorrhagic component. No free air. Musculoskeletal: There are no acute or suspicious osseous abnormalities. IMPRESSION: 1. Moderate to large  volume of abdominal and pelvic ascites, progressed from recent exam. Cause for ascites is not identified. There is no hemorrhagic component nor free air. Consider fluid sampling. 2. Contour deformity of the bladder dome may represent a bladder diverticulum. 3. Duplicated IVC. Electronically Signed   By: Narda Rutherford M.D.   On: 04/13/2020 20:02   DG Chest 2 View  Result Date: 04/13/2020 CLINICAL DATA:  Shortness of breath EXAM: CHEST - 2 VIEW COMPARISON:  08/05/2014 FINDINGS: Cardiac shadow is within normal limits. The lungs are well aerated bilaterally. No focal infiltrate or sizable effusion is seen. No acute bony abnormality is noted. IMPRESSION: No active cardiopulmonary disease. Electronically Signed   By: Alcide Clever M.D.   On: 04/13/2020 18:44   ECHOCARDIOGRAM COMPLETE  Result Date: 04/14/2020    ECHOCARDIOGRAM REPORT   Patient Name:   Kim Pacheco Date of Exam: 04/14/2020 Medical Rec #:  [id number]               Height:       60.0 in Accession #:    [id number]              Weight:       134.1 lb Date of Birth:  [birth year]  BSA:          1.575 m Patient Age:    34 years                BP:           119/86 mmHg Patient Gender: F                       HR:           81 bpm. Exam Location:  Inpatient Procedure: 2D Echo Indications:    CHF-Acute Diastolic 428.31 / I50.31  History:        Patient has no prior history of Echocardiogram examinations.                 Past medical history of hepatitis B, ascites of unclear                 etiology.  Sonographer:    Leta Junglingiffany Cooper RDCS Referring Phys: [phone] STEPHEN K CHIU  Sonographer Comments: Technically difficult study due to poor echo windows. Exam ended per patients request. IMPRESSIONS  1. Left ventricular ejection fraction, by estimation, is 55 to 60%. The left ventricle has normal function. The left ventricle has no regional wall motion abnormalities. Left ventricular diastolic function could not be evaluated.  2. Right  ventricular systolic function is normal. The right ventricular size is normal.  3. Large pleural effusion.  4. The mitral valve is normal in structure. No evidence of mitral valve regurgitation.  5. The aortic valve is normal in structure. Aortic valve regurgitation is not visualized. FINDINGS  Left Ventricle: Left ventricular ejection fraction, by estimation, is 55 to 60%. The left ventricle has normal function. The left ventricle has no regional wall motion abnormalities. The left ventricular internal cavity size was normal in size. There is  no left ventricular hypertrophy. Left ventricular diastolic function could not be evaluated. Right Ventricle: The right ventricular size is normal. Right vetricular wall thickness was not assessed. Right ventricular systolic function is normal. Left [facility]: Left atrial size was normal in size. Right [facility]: Right atrial size was normal in size. Pericardium: There is no evidence of pericardial effusion. Mitral Valve: The mitral valve is normal in structure. No evidence of mitral valve regurgitation. Tricuspid Valve: The tricuspid valve is normal in structure. Tricuspid valve regurgitation is trivial. Aortic Valve: The aortic valve is normal in structure. Aortic valve regurgitation is not visualized. Pulmonic Valve: The pulmonic valve was normal in structure. Pulmonic valve regurgitation is not visualized. Aorta: The aortic root is normal in size and structure. IAS/Shunts: The interatrial septum was not assessed. Additional Comments: There is a large pleural effusion.  LEFT VENTRICLE PLAX 2D LVIDd:         3.50 cm  Diastology LVIDs:         2.40 cm  LV e' medial:    5.22 cm/s LV PW:         0.80 cm  LV E/e' medial:  9.3 LV IVS:        0.70 cm  LV e' lateral:   6.42 cm/s LVOT diam:     1.80 cm  LV E/e' lateral: 7.5 LV SV:         41 LV SV Index:   26 LVOT Area:     2.54 cm  RIGHT VENTRICLE RV S prime:     8.05 cm/s LEFT ATRIUM  Index       RIGHT ATRIUM          Index  LA diam:        3.10 cm 1.97 cm/m  RA Area:     7.94 cm LA Vol (A2C):   17.8 ml 11.30 ml/m RA Volume:   12.60 ml 8.00 ml/m LA Vol (A4C):   22.3 ml 14.16 ml/m LA Biplane Vol: 20.0 ml 12.70 ml/m  AORTIC VALVE LVOT Vmax:   86.80 cm/s LVOT Vmean:  56.600 cm/s LVOT VTI:    0.161 m  AORTA Ao Root diam: 2.30 cm MITRAL VALVE MV Area (PHT): 2.99 cm    SHUNTS MV Decel Time: 254 msec    Systemic VTI:  0.16 m MV E velocity: 48.40 cm/s  Systemic Diam: 1.80 cm MV A velocity: 66.80 cm/s MV E/A ratio:  0.72 Dietrich Pates MD Electronically signed by Dietrich Pates MD Signature Date/Time: 04/14/2020/7:29:20 PM    Final       Principal Problem:   ARF (acute renal failure) (HCC) Active Problems:   Hepatitis B   Ascites   Abdominal pain     LOS: 2 days   Willette Cluster ,NP 04/15/2020, 9:57 AM    Attending physician's note   I have taken an interval history, reviewed the chart and examined the patient. I agree with the Advanced Practitioner's note, impression and recommendations.   Recurrent ascites. ?etiology. SAAG>1.1 c/w portal hypertension.  However no other signs of cirrhosis.  Comparing CTs, liver does look somewhat shrunken but without nodularity. S/P X-lap in 2020 for suspected ovarian malignancy.  No malignancy found.  Ovaries look normal on recent CT. AFP-Nl. 2DE- Nl LVEF  Chronic HBV vs chronic carrier - but has been on tenofovir.  Stopped on her own. Followed by Annam[facility] Major.  AKI -likely due to volume depletion/ATN/postobstructive d/t bladder outlet obstruction.  Appreciate renal consultation.  Not HRS (Una>10). Cr down to 1.8 today.  Plan: -Follow results of Doppler US liver to r/o Budd-Chiari syndrome. -Review of serial CTs with Dr. Irish Lack early next week. -Obtain last note from [facility] and previous extensive work-up -Low-salt renal diet for now. -Once Cr is down, can start diuretics and/or LVP -Hold off on liver bx for now.  May eventually need it depending upon the above  work-up to clarify etiology. -Fortunately Dr. Orvan Falconer taking over service from Monday.  We will also get her opinion.   Edman Circle, MD [facility] [phone]

## 2020-04-15 NOTE — Progress Notes (Signed)
[facility] Progress Note  Subjective: creat down 1.8 this am, foley in place, 1600 UOP yest  Vitals:   04/14/20 1442 04/14/20 2026 04/15/20 0438 04/15/20 0500  BP: 120/85 (!) 128/95 (!) 91/55   Pulse: 70 73 73   Resp: 20 20 18    Temp: 99.1 F (37.3 C) 98.3 F (36.8 C) 98.8 F (37.1 C)   TempSrc:  Oral Oral   SpO2: 98% 100% 97%   Weight:    61.2 kg    Exam: Gen alert, no distress, calmer today No jvd or bruits Chest clear bilat RRR no MRG, quiet precordium Abd firm, mod distended, no fluid wave, +ascites, no hsm Ext no edema  Neuro is alert, Ox 3 , nf, no asterixis    Home meds:  - norco prn/ advil 200 qid prn/ naproxen 375 bid prn  - zofran prn/ colace qd    CT abd noncon 11/4 - Adrenals/Urinary Tract: Normal adrenal glands. No renal    stone or hydronephrosis. No perinephric edema. Urinary bladder is physiologically distended. There is a contour deformity of the bladder dome which may represent a bladder diverticulum, for example series 6, image 81. No bladder wall thickening.     CXR - normal CXR, no active disease   ECHO 11/5 - results pending     UA 11/4 - negative, 11-20 epis, 0-5 rbc/ wbc, rare bact   UNa 43,  UCr 73    Alb 5.2 > 4.6 > 3.7 today     Tprot 10.2 > 8.9 > 7.4 today (6.5- 8.1)     Hb 15 > 14 > 12.9 today     Ascitic fluid alb <1.0, tnc 204 , 89% mono  Assessment/ Plan: 1. Renal failure - presumably acute, creat was 0.84 four days ago on 11/01. Creat 3.7 on admit, peak 4.1 then down to 1.8 today after foley placement yesterday.   UA negative, BP's okay. Ascites on exam and per CT. Per GI pt may have portal HTN. +nsaids. Suspect AKI d/t intravasc vol depletion (note ^^Hb, alb) + nsaids +/-  bladder retention +/- portal HTN causing hemodynamic AKI. Hepatorenal unlikely given high UNa. Very good response to IVF"s, foley placement, holding nsaids. Creat down to 1.8. Cont IVF"s at 75/hr, check lab in am.   2. Abd pain - ascites on CT, no  acute findings.  3. Chronic hep B - off meds since April. +ascites on exam/ CT. May have cirrhosis +/- portal htn per GI , w/u in progress.        Rob Paulette Lynch 04/15/2020, 9:11 AM   Recent Labs  Lab 04/14/20 0419 04/15/20 0520  K 4.7 4.2  BUN 48* 28*  CREATININE 4.22* 1.81*  CALCIUM 8.7* 8.7*  HGB 14.8 12.9   Inpatient medications: . Chlorhexidine Gluconate Cloth  6 each Topical Daily  . heparin  5,000 Units Subcutaneous Q8H  . influenza vac split quadrivalent PF  0.5 mL Intramuscular Tomorrow-1000   . sodium chloride 100 mL/hr at 04/15/20 0513   acetaminophen **OR** acetaminophen, calcium carbonate, morphine injection, ondansetron **OR** ondansetron (ZOFRAN) IV, oxyCODONE

## 2020-04-15 NOTE — Progress Notes (Signed)
PROGRESS NOTE    Kim Pacheco  WUJ:[id number] DOB: April 18, 1986 DOA: 04/13/2020 PCP: Wynn Banker, MD    Brief Narrative:  34yo with hx of hx hep B, depression, ETOH abuse (last intake was one week prior to visit), constipation who presented with increasing abd girth, found to have symptomatic ascites and worsening renal failure  Assessment & Plan:   Principal Problem:   ARF (acute renal failure) (HCC) Active Problems:   Hepatitis B   Ascites   Abdominal pain   1. Acute renal failure/hyponatremia:  -Creatinine was 0.84 on November 1, rising to 3.77 on day of admission -Cr up to over 4 today -Have consulted Nephrology for assistance, appreciate recs.  -Concern for possible dehydration with underlying ATN from NSAIDS with portal HTN  -Also noted to have evidence of bladder outlet obstruction -Renal function now much improved with foley cath and IVF hydration -Will repeat bmet in AM  2. Ascites:  -Uncertain etiology -SAAG noted to be >1.1, suggesting underlying portal hypertension in the setting of ETOH use and hep B -Appreciate input by GI. Recommendation for doppler liver to r/o Budd-Chiari -2d echo ordered and performed, reviewed. Normal LVEF. Note of large pleural effusion mentioned on report, see below -AFP levels unremarkable  -continue to avoid hepatotoxic agents -Repeat LFT in AM  3. History of alcohol abuse:  -Pt had noted drinking 3 to 4 glasses of wine daily, last intake was one week prior -Cont to monitor for withdrawal symptoms, although pt is outside withdrawal window at this time -Remains without withdrawal symptoms  4.  History of hepatitis B:  -Reportedly had followed a liver specialist [place].   -Pt reports not have taken antiviral previously.   5. Possible pleural effusion -Noted on 2d echo -Recent CXR on 11/4, reviewed, was clear -Lungs are clear B at the bases with good air movement -Have ordered repeat CXR, pending   DVT  prophylaxis: Heparin subq Code Status: Full Family Communication: Pt in room, family not at bedside  Status is: Inpatient  Remains inpatient appropriate because:Unsafe d/c plan, IV treatments appropriate due to intensity of illness or inability to take PO and Inpatient level of care appropriate due to severity of illness   Dispo: The patient is from: Home              Anticipated d/c is to: Home              Anticipated d/c date is: > 3 days              Patient currently is not medically stable to d/c.   Consultants:   GI  Nephrology  Procedures:     Antimicrobials: Anti-infectives (From admission, onward)   None      Subjective: Without complaints this AM. Feeling better. Asking when foley can be removed  Objective: Vitals:   04/14/20 2026 04/15/20 0438 04/15/20 0500 04/15/20 1324  BP: (!) 128/95 (!) 91/55  118/72  Pulse: 73 73  61  Resp: 20 18  18   Temp: 98.3 F (36.8 C) 98.8 F (37.1 C)  98.3 F (36.8 C)  TempSrc: Oral Oral  Oral  SpO2: 100% 97%  99%  Weight:   61.2 kg     Intake/Output Summary (Last 24 hours) at 04/15/2020 1415 Last data filed at 04/15/2020 1336 Gross per 24 hour  Intake 2557.38 ml  Output 2400 ml  Net 157.38 ml   Filed Weights   04/15/20 0500  Weight: 61.2 kg  Examination: General exam: Conversant, in no acute distress Respiratory system: normal chest rise, clear, no audible wheezing Cardiovascular system: regular rhythm, s1-s2 Gastrointestinal system: distended, ascitic, nontender, pos BS Central nervous system: No seizures, no tremors Extremities: No cyanosis, no joint deformities Skin: No rashes, no pallor Psychiatry: Affect normal // no auditory hallucinations   Data Reviewed: I have personally reviewed following labs and imaging studies  CBC: Recent Labs  Lab 04/10/20 1153 04/13/20 1830 04/14/20 0419 04/15/20 0520  WBC 5.8 8.0 8.6 6.3  NEUTROABS  --  4.9  --   --   HGB 14.1 15.2* 14.8 12.9  HCT 42.5 43.8  43.5 38.1  MCV 93.2 89.9 90.1 90.9  PLT 229 270 270 231   Basic Metabolic Panel: Recent Labs  Lab 04/10/20 1153 04/13/20 1830 04/14/20 0419 04/15/20 0520  NA 139 130* 129* 136  K 4.4 4.0 4.7 4.2  CL 103 91* 97* 106  CO2 26 24 18* 19*  GLUCOSE 133* 102* 99 93  BUN 15 41* 48* 28*  CREATININE 0.84 3.77* 4.22* 1.81*  CALCIUM 9.5 9.3 8.7* 8.7*  MG  --   --   --  2.1   GFR: Estimated Creatinine Clearance: 35.1 mL/min (A) (by C-G formula based on SCr of 1.81 mg/dL (H)). Liver Function Tests: Recent Labs  Lab 04/10/20 1153 04/13/20 1830 04/14/20 0419 04/15/20 0520  AST ALT 37 ALKPHOS 47 60 59 46  BILITOT 1.0 1.5* 1.3* 1.3*  PROT 8.2* 10.2* 8.9* 7.4  ALBUMIN 4.4 5.2* 4.6 3.7   Recent Labs  Lab 04/10/20 1153 04/13/20 1830  LIPASE 32 28   No results for input(s): AMMONIA in the last 168 hours. Coagulation Profile: Recent Labs  Lab 04/14/20 0419  INR 1.0   Cardiac Enzymes: No results for input(s): CKTOTAL, CKMB, CKMBINDEX, TROPONINI in the last 168 hours. BNP (last 3 results) No results for input(s): PROBNP in the last 8760 hours. HbA1C: No results for input(s): HGBA1C in the last 72 hours. CBG: No results for input(s): GLUCAP in the last 168 hours. Lipid Profile: No results for input(s): CHOL, HDL, LDLCALC, TRIG, CHOLHDL, LDLDIRECT in the last 72 hours. Thyroid Function Tests: Recent Labs    04/14/20 0419  TSH 1.241   Anemia Panel: No results for input(s): VITAMINB12, FOLATE, FERRITIN, TIBC, IRON, RETICCTPCT in the last 72 hours. Sepsis Labs: No results for input(s): PROCALCITON, LATICACIDVEN in the last 168 hours.  Recent Results (from the past 240 hour(s))  Body fluid culture     Status: None (Preliminary result)   Collection Time: 04/13/20  9:10 PM   Specimen: Peritoneal Cavity; Peritoneal Fluid  Result Value Ref Range Status   Specimen Description   Final    PERITONEAL CAVITY Performed at [facility],  [address] [place], [place] [address]    Special Requests   Final    NONE Performed at [facility], [address] [place], [place] [address]    Gram Stain   Final    WBC PRESENT, PREDOMINANTLY PMN NO ORGANISMS SEEN CYTOSPIN SMEAR    Culture   Final    NO GROWTH 1 DAY Performed at [facility], [address] [place], [place] [address]    Report Status PENDING  Incomplete  Respiratory Panel by RT PCR (Flu A&B, Covid) - Nasopharyngeal Swab     Status: None   Collection Time: 04/13/20 10:24 PM   Specimen: Nasopharyngeal Swab  Result Value Ref Range Status   SARS Coronavirus 2 by RT PCR NEGATIVE NEGATIVE Final    Comment: (NOTE) SARS-CoV-2 target nucleic acids are NOT DETECTED.  The SARS-CoV-2 RNA is generally detectable in upper respiratoy specimens during the acute phase of infection. The lowest concentration of SARS-CoV-2 viral copies this assay can detect is 131 copies/mL. A negative result does not preclude SARS-Cov-2 infection and should not be used as the sole basis for treatment or other patient management decisions. A negative result may occur with  improper specimen collection/handling, submission of specimen other than nasopharyngeal swab, presence of viral mutation(s) within the areas targeted by this assay, and inadequate number of viral copies (<131 copies/mL). A negative result must be combined with clinical observations, patient history, and epidemiological information. The expected result is Negative.  Fact Sheet for Patients:  https://www.moore.com/  Fact Sheet for Healthcare Providers:  https://www.young.biz/  This test is no t yet approved or cleared by the [place] FDA and  has been authorized for detection and/or diagnosis of SARS-CoV-2 by FDA under an Emergency Use Authorization (EUA). This EUA will remain  in effect (meaning this test can be used) for the duration of  the COVID-19 declaration under Section 564(b)(1) of the Act, 21 U.S.C. section 360bbb-3(b)(1), unless the authorization is terminated or revoked sooner.     Influenza A by PCR NEGATIVE NEGATIVE Final   Influenza B by PCR NEGATIVE NEGATIVE Final    Comment: (NOTE) The Xpert Xpress SARS-CoV-2/FLU/RSV assay is intended as an aid in  the diagnosis of influenza from Nasopharyngeal swab specimens and  should not be used as a sole basis for treatment. Nasal washings and  aspirates are unacceptable for Xpert Xpress SARS-CoV-2/FLU/RSV  testing.  Fact Sheet for Patients: https://www.moore.com/  Fact Sheet for Healthcare Providers: https://www.young.biz/  This test is not yet approved or cleared by the [place] FDA and  has been authorized for detection and/or diagnosis of SARS-CoV-2 by  FDA under an Emergency Use Authorization (EUA). This EUA will remain  in effect (meaning this test can be used) for the duration of the  Covid-19 declaration under Section 564(b)(1) of the Act, 21  U.S.C. section 360bbb-3(b)(1), unless the authorization is  terminated or revoked. Performed at [facility], [address], [place], [place] [address]      Radiology Studies: CT ABDOMEN PELVIS WO CONTRAST  Result Date: 04/13/2020 CLINICAL DATA:  Acute severe nonlocalized abdominal pain with rebound tenderness. Concern for acute abdomen. Recent diagnosis of ruptured ovarian cyst. EXAM: CT ABDOMEN AND PELVIS WITHOUT CONTRAST TECHNIQUE: Multidetector CT imaging of the abdomen and pelvis was performed following the standard protocol without IV contrast. COMPARISON:  Abdominal CT and pelvic ultrasound 6 days ago 04/07/2020. CT 06/26/2018 also reviewed. FINDINGS: Lower chest: The lung bases are clear. No focal consolidation or pleural effusion Hepatobiliary: No focal liver abnormality is seen. Borderline hepatic steatosis. No gallstones, gallbladder wall  thickening, or biliary dilatation. Pancreas: No ductal dilatation or inflammation. Spleen: Normal in size without focal abnormality. Adrenals/Urinary Tract: Normal adrenal glands. No renal stone or hydronephrosis. No perinephric edema. Urinary bladder is physiologically distended. There is a contour deformity of the bladder dome which may represent a bladder diverticulum, for example series 6, image 81. No bladder wall thickening. Stomach/Bowel: Bowel evaluation is limited in the absence of contrast and presence of intra-abdominal ascites. Ingested material within the stomach. No obvious gastric wall thickening. Decompressed small bowel without inflammation or obvious wall thickening. Liquid stool in the  colon. No formed stool. The appendix is not well seen on the current exam. No evidence of appendicitis. Vascular/Lymphatic: Abdominal aorta is normal in caliber. Duplicated IVC. No adenopathy. Reproductive: Uterus is unremarkable. Ovaries tentatively visualized and symmetric in size. No adnexal mass. Other: Moderate to large volume of abdominal and pelvic ascites, progressed from recent exam. Ascites was also seen on January 2020 CT. The etiology is indeterminate. Ascites measures simple fluid density. There is no hemorrhagic component. No free air. Musculoskeletal: There are no acute or suspicious osseous abnormalities. IMPRESSION: 1. Moderate to large volume of abdominal and pelvic ascites, progressed from recent exam. Cause for ascites is not identified. There is no hemorrhagic component nor free air. Consider fluid sampling. 2. Contour deformity of the bladder dome may represent a bladder diverticulum. 3. Duplicated IVC. Electronically Signed   By: Narda RutherfordMelanie  Sanford M.D.   On: 04/13/2020 20:02   DG Chest 2 View  Result Date: 04/13/2020 CLINICAL DATA:  Shortness of breath EXAM: CHEST - 2 VIEW COMPARISON:  08/05/2014 FINDINGS: Cardiac shadow is within normal limits. The lungs are well aerated bilaterally. No  focal infiltrate or sizable effusion is seen. No acute bony abnormality is noted. IMPRESSION: No active cardiopulmonary disease. Electronically Signed   By: Alcide CleverMark  Lukens M.D.   On: 04/13/2020 18:44   ECHOCARDIOGRAM COMPLETE  Result Date: 04/14/2020    ECHOCARDIOGRAM REPORT   Patient Name:   Kim Pacheco Date of Exam: 04/14/2020 Medical Rec #:  [id number]               Height:       60.0 in Accession #:    [id number]              Weight:       134.1 lb Date of Birth:  January 13, 1986               BSA:          1.575 m Patient Age:    34 years                BP:           119/86 mmHg Patient Gender: F                       HR:           81 bpm. Exam Location:  Inpatient Procedure: 2D Echo Indications:    CHF-Acute Diastolic 428.31 / I50.31  History:        Patient has no prior history of Echocardiogram examinations.                 Past medical history of hepatitis B, ascites of unclear                 etiology.  Sonographer:    Leta Junglingiffany Cooper RDCS Referring Phys: [id number] Jayelle Page K Quinisha Mould  Sonographer Comments: Technically difficult study due to poor echo windows. Exam ended per patients request. IMPRESSIONS  1. Left ventricular ejection fraction, by estimation, is 55 to 60%. The left ventricle has normal function. The left ventricle has no regional wall motion abnormalities. Left ventricular diastolic function could not be evaluated.  2. Right ventricular systolic function is normal. The right ventricular size is normal.  3. Large pleural effusion.  4. The mitral valve is normal in structure. No evidence of mitral valve regurgitation.  5. The aortic valve is normal in structure. Aortic valve regurgitation is not visualized. FINDINGS  Left Ventricle: Left ventricular ejection fraction, by estimation, is 55 to 60%. The left ventricle has normal function. The left ventricle has no regional wall motion abnormalities. The left ventricular internal cavity size was normal in size. There is  no left ventricular  hypertrophy. Left ventricular diastolic function could not be evaluated. Right Ventricle: The right ventricular size is normal. Right vetricular wall thickness was not assessed. Right ventricular systolic function is normal. Left [facility]: Left atrial size was normal in size. Right [facility]: Right atrial size was normal in size. Pericardium: There is no evidence of pericardial effusion. Mitral Valve: The mitral valve is normal in structure. No evidence of mitral valve regurgitation. Tricuspid Valve: The tricuspid valve is normal in structure. Tricuspid valve regurgitation is trivial. Aortic Valve: The aortic valve is normal in structure. Aortic valve regurgitation is not visualized. Pulmonic Valve: The pulmonic valve was normal in structure. Pulmonic valve regurgitation is not visualized. Aorta: The aortic root is normal in size and structure. IAS/Shunts: The interatrial septum was not assessed. Additional Comments: There is a large pleural effusion.  LEFT VENTRICLE PLAX 2D LVIDd:         3.50 cm  Diastology LVIDs:         2.40 cm  LV e' medial:    5.22 cm/s LV PW:         0.80 cm  LV E/e' medial:  9.3 LV IVS:        0.70 cm  LV e' lateral:   6.42 cm/s LVOT diam:     1.80 cm  LV E/e' lateral: 7.5 LV SV:         41 LV SV Index:   26 LVOT Area:     2.54 cm  RIGHT VENTRICLE RV S prime:     8.05 cm/s LEFT ATRIUM             Index       RIGHT ATRIUM          Index LA diam:        3.10 cm 1.97 cm/m  RA Area:     7.94 cm LA Vol (A2C):   17.8 ml 11.30 ml/m RA Volume:   12.60 ml 8.00 ml/m LA Vol (A4C):   22.3 ml 14.16 ml/m LA Biplane Vol: 20.0 ml 12.70 ml/m  AORTIC VALVE LVOT Vmax:   86.80 cm/s LVOT Vmean:  56.600 cm/s LVOT VTI:    0.161 m  AORTA Ao Root diam: 2.30 cm MITRAL VALVE MV Area (PHT): 2.99 cm    SHUNTS MV Decel Time: 254 msec    Systemic VTI:  0.16 m MV E velocity: 48.40 cm/s  Systemic Diam: 1.80 cm MV A velocity: 66.80 cm/s MV E/A ratio:  0.72 Dietrich Pates MD Electronically signed by Dietrich Pates MD Signature  Date/Time: 04/14/2020/7:29:20 PM    Final     Scheduled Meds: . Chlorhexidine Gluconate Cloth  6 each Topical Daily  . heparin  5,000 Units Subcutaneous Q8H   Continuous Infusions: . sodium chloride 75 mL/hr at 04/15/20 1150     LOS: 2 days   Rickey Barbara, MD [facility] Pager On Amion  If 7PM-7AM, please contact night-coverage 04/15/2020, 2:15 PM

## 2020-04-16 DIAGNOSIS — R188 Other ascites: Secondary | ICD-10-CM | POA: Diagnosis not present

## 2020-04-16 DIAGNOSIS — B181 Chronic viral hepatitis B without delta-agent: Secondary | ICD-10-CM | POA: Diagnosis not present

## 2020-04-16 LAB — COMPREHENSIVE METABOLIC PANEL
ALT: 19 U/L (ref 0–44)
AST: 17 U/L (ref 15–41)
Albumin: 2.9 g/dL — ABNORMAL LOW (ref 3.5–5.0)
Alkaline Phosphatase: 39 U/L (ref 38–126)
Anion gap: 8 (ref 5–15)
BUN: 11 mg/dL (ref 6–20)
CO2: 22 mmol/L (ref 22–32)
Calcium: 8 mg/dL — ABNORMAL LOW (ref 8.9–10.3)
Chloride: 110 mmol/L (ref 98–111)
Creatinine, Ser: 0.61 mg/dL (ref 0.44–1.00)
GFR, Estimated: >60 mL/min (ref >60)
Glucose, Bld: 85 mg/dL (ref 70–99)
Potassium: 4 mmol/L (ref 3.5–5.1)
Sodium: 140 mmol/L (ref 135–145)
Total Bilirubin: 0.8 mg/dL (ref 0.3–1.2)
Total Protein: 5.7 g/dL — ABNORMAL LOW (ref 6.5–8.1)

## 2020-04-16 MED ORDER — PANTOPRAZOLE SODIUM 40 MG PO TBEC
40.0000 mg | DELAYED_RELEASE_TABLET | Freq: Every day | ORAL | Status: DC
  Administered 2020-04-16 – 2020-04-20 (×5): 40 mg via ORAL
  Filled 2020-04-16 (×5): qty 1

## 2020-04-16 MED ORDER — SALINE SPRAY 0.65 % NA SOLN
1.0000 | NASAL | Status: DC | PRN
  Administered 2020-04-16: 1 via NASAL
  Filled 2020-04-16: qty 44

## 2020-04-16 NOTE — Progress Notes (Addendum)
Prior to shift change pt was bladder scanned for . RN tried to in and out x2 w no output. they finally in and out with foley and left in place and waited for the urine to drain. pt was able to drain 475 over 2 hours. Bladder is distended on palpation. Pt states she feels the pressure. New bladder scan shows that she retaining . Straght cath order is for >300. Secured messaged on call provider questioning in and out orders vs placing and keeping foley catheter and starting patient on medication to help with bladder with bladder spasms. Currently patient with foley in place since bladder is slowly draining.    0400 Foley continues to drain with output >1781ml in 8 hours. Patient bladder scanned to check for residual. Scan amount shows . Bladder distended with palpation. Pt continues to state she feels pressure. Will continue to monitor.

## 2020-04-16 NOTE — Progress Notes (Addendum)
Progress Note  Chief Complaint:   ascites       ASSESSMENT / PLAN:    #Recurrent ascites without cirrhosis on CT scan, however, Dr. Chales AbrahamsGupta reviewed the scan and feelsliverlooks shrunken compared to prior imaging.  SAAG c/w portal HTN.  INR normal.Dx tap this admission negative for SBP.  --US with liver doppler results pending.  --Cytology is pending, will follow up on results --Of note, reviewed Care Everywhere records. She underwent exp lap in 2020 for evaluation of ascites and elevated CA 125. No findings of malignancy.  --Start PPI for epigastric pain --Start diuretics in next couple of days.  Marland Kitchen.  #Chronic hepatitis B. She stopped anti-viral treatementseveral months ago. She was exercises and didn't want t take any medications.Reviewed notes from [facility] and there is a history of variable compliance to HBV medication.  --obtainHBVviral load  # AKI. Cr got up to 4.22. Appreciate Nephrology's assistance. Cr now normal.  AKI was felt to be secondary to volume depletion + NSAIDS.          SUBJECTIVE:   Mild epigastric discomfort    OBJECTIVE:    Scheduled inpatient medications:  . Chlorhexidine Gluconate Cloth  6 each Topical Daily  . heparin  5,000 Units Subcutaneous Q8H   Continuous inpatient infusions:  . sodium chloride 75 mL/hr at 04/16/20 0434   PRN inpatient medications: acetaminophen **OR** acetaminophen, calcium carbonate, morphine injection, ondansetron **OR** ondansetron (ZOFRAN) IV, oxyCODONE, sodium chloride  Vital signs in last 24 hours: Temp:  [98.3 F (36.8 C)-98.5 F (36.9 C)] 98.4 F (36.9 C) (11/07 0439) Pulse Rate:  [60-76] 60 (11/07 0439) Resp:  [16-18] 16 (11/07 0439) BP: (116-122)/(72-83) 122/79 (11/07 0439) SpO2:  [97 %-99 %] 97 % (11/07 0439) Weight:  [62.5 kg] 62.5 kg (11/07 0439) Last BM Date: 04/13/20  Intake/Output Summary (Last 24 hours) at 04/16/2020 1100 Last data filed at 04/16/2020 [id number] Gross per 24 hour   Intake 2217.67 ml  Output 4850 ml  Net -2632.33 ml     Physical Exam:  . General: Alert, female in NAD . Heart:  Regular rate and rhythm. No lower extremity edema . Pulmonary: Normal respiratory effort . Abdomen: Soft, mild distention, nontender. Normal bowel sounds.  . Neurologic: Alert and oriented . Psych: Pleasant. Cooperative.   Filed Weights   04/15/20 0500 04/16/20 0439  Weight: 61.2 kg 62.5 kg    Intake/Output from previous day: 11/06 0701 - 11/07 0700 In: 1737.7 [P.O.:840; I.V.:897.7] Out: 4850 [Urine:3550] Intake/Output this shift: Total I/O In: 480 [P.O.:480] Out: -     Lab Results: Recent Labs    04/13/20 1830 04/14/20 0419 04/15/20 0520  WBC 8.0 8.6 6.3  HGB 15.2* 14.8 12.9  HCT 43.8 43.5 38.1  PLT 270 270 231   BMET Recent Labs    04/14/20 0419 04/15/20 0520 04/16/20 0437  NA 129* 136 140  K 4.7 4.2 4.0  CL 97* 106 110  CO2 18* 19* 22  GLUCOSE 99 93 85  BUN 48* 28* 11  CREATININE 4.22* 1.81* 0.61  CALCIUM 8.7* 8.7* 8.0*   LFT Recent Labs    04/16/20 0437  PROT 5.7*  ALBUMIN 2.9*  AST 17  ALT 19  ALKPHOS 39  BILITOT 0.8   PT/INR Recent Labs    04/14/20 0419  LABPROT 12.6  INR 1.0   Hepatitis Panel No results for input(s): HEPBSAG, HCVAB, HEPAIGM, HEPBIGM in the last 72 hours.  US RENAL  Result Date: 04/15/2020 CLINICAL  DATA:  Acute kidney injury EXAM: RENAL / URINARY TRACT ULTRASOUND COMPLETE COMPARISON:  CT abdomen pelvis 04/13/2020 FINDINGS: Right Kidney: Renal measurements: 9.5 x 5.7 x 5.4 cm = volume: 153 mL. Echogenicity within normal limits. No mass or hydronephrosis visualized. Left Kidney: Renal measurements: 9.7 by 6.0 x 6.0 cm = volume: 186 mL. Echogenicity within normal limits. No mass or hydronephrosis visualized. Bladder: Not visualized. Other: Ascites. IMPRESSION: 1.  Unremarkable sonographic appearance of the bilateral kidneys. 2.  Bladder could not be definitely visualized. Electronically Signed   By: Emmaline Kluver M.D.   On: 04/15/2020 15:54   DG CHEST PORT 1 VIEW  Result Date: 04/15/2020 CLINICAL DATA:  Pleural effusions, recurrent ascites, chronic hepatitis-B EXAM: PORTABLE CHEST 1 VIEW COMPARISON:  04/13/2020 chest radiograph. FINDINGS: Stable cardiomediastinal silhouette with normal heart size. No pneumothorax. Slight blunting of the costophrenic angles bilaterally, cannot exclude trace pleural effusions. No pulmonary edema. Minimal left basilar atelectasis. IMPRESSION: Slight blunting of the costophrenic angles bilaterally, cannot exclude trace pleural effusions. Minimal left basilar atelectasis. Electronically Signed   By: Delbert Phenix M.D.   On: 04/15/2020 17:19   ECHOCARDIOGRAM COMPLETE  Result Date: 04/14/2020    ECHOCARDIOGRAM REPORT   Patient Name:   Kim Pacheco Date of Exam: 04/14/2020 Medical Rec #:  [id number]               Height:       60.0 in Accession #:    [id number]              Weight:       134.1 lb Date of Birth:  1986/03/23               BSA:          1.575 m Patient Age:    34 years                BP:           119/86 mmHg Patient Gender: F                       HR:           81 bpm. Exam Location:  Inpatient Procedure: 2D Echo Indications:    CHF-Acute Diastolic 428.31 / I50.31  History:        Patient has no prior history of Echocardiogram examinations.                 Past medical history of hepatitis B, ascites of unclear                 etiology.  Sonographer:    Leta Jungling RDCS Referring Phys: [phone] STEPHEN K CHIU  Sonographer Comments: Technically difficult study due to poor echo windows. Exam ended per patients request. IMPRESSIONS  1. Left ventricular ejection fraction, by estimation, is 55 to 60%. The left ventricle has normal function. The left ventricle has no regional wall motion abnormalities. Left ventricular diastolic function could not be evaluated.  2. Right ventricular systolic function is normal. The right ventricular size is normal.  3. Large  pleural effusion.  4. The mitral valve is normal in structure. No evidence of mitral valve regurgitation.  5. The aortic valve is normal in structure. Aortic valve regurgitation is not visualized. FINDINGS  Left Ventricle: Left ventricular ejection fraction, by estimation, is 55 to 60%. The left ventricle has normal function. The left ventricle has no regional wall motion abnormalities. The  left ventricular internal cavity size was normal in size. There is  no left ventricular hypertrophy. Left ventricular diastolic function could not be evaluated. Right Ventricle: The right ventricular size is normal. Right vetricular wall thickness was not assessed. Right ventricular systolic function is normal. Left [facility]: Left atrial size was normal in size. Right [facility]: Right atrial size was normal in size. Pericardium: There is no evidence of pericardial effusion. Mitral Valve: The mitral valve is normal in structure. No evidence of mitral valve regurgitation. Tricuspid Valve: The tricuspid valve is normal in structure. Tricuspid valve regurgitation is trivial. Aortic Valve: The aortic valve is normal in structure. Aortic valve regurgitation is not visualized. Pulmonic Valve: The pulmonic valve was normal in structure. Pulmonic valve regurgitation is not visualized. Aorta: The aortic root is normal in size and structure. IAS/Shunts: The interatrial septum was not assessed. Additional Comments: There is a large pleural effusion.  LEFT VENTRICLE PLAX 2D LVIDd:         3.50 cm  Diastology LVIDs:         2.40 cm  LV e' medial:    5.22 cm/s LV PW:         0.80 cm  LV E/e' medial:  9.3 LV IVS:        0.70 cm  LV e' lateral:   6.42 cm/s LVOT diam:     1.80 cm  LV E/e' lateral: 7.5 LV SV:         41 LV SV Index:   26 LVOT Area:     2.54 cm  RIGHT VENTRICLE RV S prime:     8.05 cm/s LEFT ATRIUM             Index       RIGHT ATRIUM          Index LA diam:        3.10 cm 1.97 cm/m  RA Area:     7.94 cm LA Vol (A2C):   17.8 ml 11.30  ml/m RA Volume:   12.60 ml 8.00 ml/m LA Vol (A4C):   22.3 ml 14.16 ml/m LA Biplane Vol: 20.0 ml 12.70 ml/m  AORTIC VALVE LVOT Vmax:   86.80 cm/s LVOT Vmean:  56.600 cm/s LVOT VTI:    0.161 m  AORTA Ao Root diam: 2.30 cm MITRAL VALVE MV Area (PHT): 2.99 cm    SHUNTS MV Decel Time: 254 msec    Systemic VTI:  0.16 m MV E velocity: 48.40 cm/s  Systemic Diam: 1.80 cm MV A velocity: 66.80 cm/s MV E/A ratio:  0.72 Dietrich Pates MD Electronically signed by Dietrich Pates MD Signature Date/Time: 04/14/2020/7:29:20 PM    Final       Principal Problem:   ARF (acute renal failure) (HCC) Active Problems:   Hepatitis B   Ascites   Abdominal pain     LOS: 3 days   Willette Cluster ,NP 04/16/2020, 11:00 AM    Attending physician's note   I have taken an interval history, reviewed the chart and examined the patient. I agree with the Advanced Practitioner's note, impression and recommendations.   Recurrentascites. ?etiology. SAAG>1.1 c/wportal HTN.However, no other signs of cirrhosis.Comparing CTs,liver does look somewhat shrunken but without nodularity. S/P laparoscopy 2020- neg for ovarian malignancy, neg cytology. Ovaries look Nl on recent CT. AFP-Nl, 2DE- Nl LVEF. In past USE (03/2018)-steatosis, F0-1 fibrosis.  ChronicHBV (HBeAg+, with >4Billion viral load 03/2018) Rx with tenofovir. Stopped on her own. Followed by Annamarie Major. H/O ETOH/marijuana in past.  Denies now.  Labs ALT 23-->19, Alb 3.7-->2.9, HBV 04/2020- 6 billion  AKI (resolved) -likely d/t volume depletion/ATN/postobstructive d/t bladder outlet obs.  Appreciate renal consultation.  Not HRS (Una>10). Cr down to 0.6 today.  Plan: -Follow results of Doppler US liverto r/oBudd-Chiari syndrome. -Review of serial CTs with Dr. Irish Lack early next week for ?  Cirrhosis -Add Protonix 40 mg p.o. QD for some epi discomfort -Advance diet to low-salt Nl protein diet -Start low-dose Lasix/Aldactone in AM, if okay with Dr.  Orvan Falconer. -Hold off on liver bx for now.  May eventually need it depending upon the above work-up to clarify etiology.  Consider EUS elastography. -Resume ?Tenofovir or switch to entecavir. -Fortunately Dr. Orvan Falconer taking over svc from tomorrow.  We will also get her opinion.   Edman Circle, MD [facility] [phone]

## 2020-04-16 NOTE — Progress Notes (Addendum)
[facility] Progress Note  Subjective: creat down 0.6 today, 3.2L UOP yesterday   Vitals:   04/15/20 0500 04/15/20 1324 04/15/20 2019 04/16/20 0439  BP:  118/72 116/83 122/79  Pulse:  61 76 60  Resp:  18 17 16   Temp:  98.3 F (36.8 C) 98.5 F (36.9 C) 98.4 F (36.9 C)  TempSrc:  Oral Oral Oral  SpO2:  99% 98% 97%  Weight: 61.2 kg       Exam: Gen alert, nad No jvd or bruits Chest clear bilat RRR no MRG, quiet precordium Abd firm, mod distended, no fluid wave, +ascites, no hsm Ext no edema  Neuro is alert, Ox 3 , nf, no asterixis    Home meds:  - norco prn/ advil 200 qid prn/ naproxen 375 bid prn  - zofran prn/ colace qd    CT abd noncon 11/4 - Adrenals/Urinary Tract: Normal adrenal glands. No renal    stone or hydronephrosis. No perinephric edema. Urinary bladder is physiologically distended. There is a contour deformity of the bladder dome which may represent a bladder diverticulum, for example series 6, image 81. No bladder wall thickening.     CXR - normal CXR, no active disease   ECHO 11/5 - results pending    Renal 13/5 - 9.5/ 9.7 cm kidneys, no hydro, normal echotexture   UA 11/4 - negative, 11-20 epis, 0-5 rbc/ wbc, rare bact   UNa 43,  UCr 73    Alb 5.2 > 4.6 > 3.7 today     Tprot 10.2 > 8.9 > 7.4 today (6.5- 8.1)     Hb 15 > 14 > 12.9 today     Ascitic fluid alb <1.0, tnc 204 , 89% mono  Assessment/ Plan: 1. Renal failure - presumably acute, creat was 0.84 four days ago on 11/01. Creat 3.7 on admit, peak 4.1 then down 1.8 > 0.61 today. UA neg , renal 13/01 no obstruction, normal appearing kidneys. Resolved. AKI due to vol depletion/ nsaids in setting of chronic liver disease and likely portal HTN (high SAAG, ascites). Pt is at risk for future AKI events given underlying / worsening liver disease. Avoid nsaid's, also would acei/ ARB in this pt if that is possible. No other suggestions. Will sign off.  2. Chronic hep B - for many years, "since birth"  per the patient. Off antivirals since April. +new / worsening ascites on exam/ CT. May have cirrhosis +/- portal htn per GI.       Rob Jacon Whetzel 04/16/2020, 6:02 AM   Recent Labs  Lab 04/14/20 0419 04/14/20 0419 04/15/20 0520 04/16/20 0437  K 4.7   < > 4.2 4.0  BUN 48*   < > 28* 11  CREATININE 4.22*   < > 1.81* 0.61  CALCIUM 8.7*   < > 8.7* 8.0*  HGB 14.8  --  12.9  --    < > = values in this interval not displayed.   Inpatient medications: . Chlorhexidine Gluconate Cloth  6 each Topical Daily  . heparin  5,000 Units Subcutaneous Q8H   . sodium chloride 75 mL/hr at 04/16/20 0434   acetaminophen **OR** acetaminophen, calcium carbonate, morphine injection, ondansetron **OR** ondansetron (ZOFRAN) IV, oxyCODONE, sodium chloride

## 2020-04-16 NOTE — Progress Notes (Signed)
PROGRESS NOTE    Kim Pacheco  AVW:[id number] DOB: 03-03-1986 DOA: 04/13/2020 PCP: Wynn Banker, MD    Brief Narrative:  34yo with hx of hx hep B, depression, ETOH abuse (last intake was one week prior to visit), constipation who presented with increasing abd girth, found to have symptomatic ascites and worsening renal failure  Assessment & Plan:   Principal Problem:   ARF (acute renal failure) (HCC) Active Problems:   Hepatitis B   Ascites   Abdominal pain   1. Acute renal failure/hyponatremia:  -Creatinine was 0.84 on November 1, rising to 3.77 on day of admission -Cr up to over 4 today -Initially consulted Nephrology for assistance, appreciate recs.  -Concern for possible dehydration with underlying ATN from NSAIDS with portal HTN  -Also noted to have evidence of bladder outlet obstruction -Renal function has normalized with foley cath and IVF hydration -Nephrology has since signed off -Will repeat bmet in AM -will d/c foley cath today and follow post-void residuals  2. Ascites:  -Uncertain etiology -SAAG noted to be >1.1, suggesting underlying portal hypertension in the setting of ETOH use and hep B -Appreciate input by GI.  -2d echo ordered and performed, reviewed. Normal LVEF. Note of large pleural effusion mentioned on report, see below -AFP levels unremarkable  -continue to avoid hepatotoxic agents -Liver doppler reviewed, normal hepatic venous doppler, suspect mild hepatic steatosis with small volume perihepatic ascites. Incidental 1.4cm gallbladder neck abnormality may represent adherent sludge. Rec for f/u RUQ [place] at 3 months and if persistent, further imaging with MRI -Repeat LFT in AM  3. History of alcohol abuse:  -Pt had noted drinking 3 to 4 glasses of wine daily, last intake was one week prior -Cont to monitor for withdrawal symptoms, although pt is outside withdrawal window at this time -Remains without withdrawal symptoms this  visit  4.  History of hepatitis B:  -Reportedly had [place] a liver specialist [place].   -Pt reports not have taken antiviral previously.   5. Possible pleural effusion -Noted on 2d echo -Recent CXR on 11/4, reviewed, was clear -Lungs had remained clear B at the bases with good air movement -Repeat CXR reviewed, only trace effusion can be appreciated  DVT prophylaxis: Heparin subq Code Status: Full Family Communication: Pt in room, family not at bedside  Status is: Inpatient  Remains inpatient appropriate because:Unsafe d/c plan, IV treatments appropriate due to intensity of illness or inability to take PO and Inpatient level of care appropriate due to severity of illness   Dispo: The patient is from: Home              Anticipated d/c is to: Home              Anticipated d/c date is: > 3 days              Patient currently is not medically stable to d/c.   Consultants:   GI  Nephrology  Procedures:     Antimicrobials: Anti-infectives (From admission, onward)   None      Subjective: No complaints this AM  Objective: Vitals:   04/15/20 1324 04/15/20 2019 04/16/20 0439 04/16/20 1338  BP: 118/72 116/83 122/79 113/83  Pulse: 61 76 60 72  Resp: 18 17 16 16   Temp: 98.3 F (36.8 C) 98.5 F (36.9 C) 98.4 F (36.9 C) 98.4 F (36.9 C)  TempSrc: Oral Oral Oral Oral  SpO2: 99% 98% 97% 99%  Weight:   62.5 kg  Intake/Output Summary (Last 24 hours) at 04/16/2020 1603 Last data filed at 04/16/2020 1412 Gross per 24 hour  Intake 1200 ml  Output 2450 ml  Net -1250 ml   Filed Weights   04/15/20 0500 04/16/20 0439  Weight: 61.2 kg 62.5 kg    Examination: General exam: Awake, laying in bed, in nad Respiratory system: Normal respiratory effort, no wheezing Cardiovascular system: regular rate, s1, s2 Gastrointestinal system: Soft, nondistended, positive BS Central nervous system: CN2-12 grossly intact, strength intact Extremities: Perfused, no  clubbing Skin: Normal skin turgor, no notable skin lesions seen Psychiatry: Mood normal // no visual hallucinations   Data Reviewed: I have personally reviewed following labs and imaging studies  CBC: Recent Labs  Lab 04/10/20 1153 04/13/20 1830 04/14/20 0419 04/15/20 0520  WBC 5.8 8.0 8.6 6.3  NEUTROABS  --  4.9  --   --   HGB 14.1 15.2* 14.8 12.9  HCT 42.5 43.8 43.5 38.1  MCV 93.2 89.9 90.1 90.9  PLT 229 270 270 231   Basic Metabolic Panel: Recent Labs  Lab 04/10/20 1153 04/13/20 1830 04/14/20 0419 04/15/20 0520 04/16/20 0437  NA 139 130* 129* 136 140  K 4.4 4.0 4.7 4.2 4.0  CL 103 91* 97* 106 110  CO2 26 24 18* 19* 22  GLUCOSE 133* 102* 99 93 85  BUN 15 41* 48* 28* 11  CREATININE 0.84 3.77* 4.22* 1.81* 0.61  CALCIUM 9.5 9.3 8.7* 8.7* 8.0*  MG  --   --   --  2.1  --    GFR: Estimated Creatinine Clearance: 80.3 mL/min (by C-G formula based on SCr of 0.61 mg/dL). Liver Function Tests: Recent Labs  Lab 04/10/20 1153 04/13/20 1830 04/14/20 0419 04/15/20 0520 04/16/20 0437  AST 24 22 23 16 17   ALT 37 31 30 23 19   ALKPHOS 47 60 59 46 39  BILITOT 1.0 1.5* 1.3* 1.3* 0.8  PROT 8.2* 10.2* 8.9* 7.4 5.7*  ALBUMIN 4.4 5.2* 4.6 3.7 2.9*   Recent Labs  Lab 04/10/20 1153 04/13/20 1830  LIPASE 32 28   No results for input(s): AMMONIA in the last 168 hours. Coagulation Profile: Recent Labs  Lab 04/14/20 0419  INR 1.0   Cardiac Enzymes: No results for input(s): CKTOTAL, CKMB, CKMBINDEX, TROPONINI in the last 168 hours. BNP (last 3 results) No results for input(s): PROBNP in the last 8760 hours. HbA1C: No results for input(s): HGBA1C in the last 72 hours. CBG: No results for input(s): GLUCAP in the last 168 hours. Lipid Profile: No results for input(s): CHOL, HDL, LDLCALC, TRIG, CHOLHDL, LDLDIRECT in the last 72 hours. Thyroid Function Tests: Recent Labs    04/14/20 0419  TSH 1.241   Anemia Panel: No results for input(s): VITAMINB12, FOLATE,  FERRITIN, TIBC, IRON, RETICCTPCT in the last 72 hours. Sepsis Labs: No results for input(s): PROCALCITON, LATICACIDVEN in the last 168 hours.  Recent Results (from the past 240 hour(s))  Body fluid culture     Status: None (Preliminary result)   Collection Time: 04/13/20  9:10 PM   Specimen: Peritoneal Cavity; Peritoneal Fluid  Result Value Ref Range Status   Specimen Description   Final    PERITONEAL CAVITY Performed at [facility], [address] [place], [place]    Special Requests   Final    NONE Performed at [facility], [address], [place], [place]    Gram Stain   Final    WBC PRESENT, PREDOMINANTLY PMN  NO ORGANISMS SEEN CYTOSPIN SMEAR    Culture   Final    NO GROWTH 2 DAYS Performed at [facility], [address] [place], [place] [address]    Report Status PENDING  Incomplete  Respiratory Panel by RT PCR (Flu A&B, Covid) - Nasopharyngeal Swab     Status: None   Collection Time: 04/13/20 10:24 PM   Specimen: Nasopharyngeal Swab  Result Value Ref Range Status   SARS Coronavirus 2 by RT PCR NEGATIVE NEGATIVE Final    Comment: (NOTE) SARS-CoV-2 target nucleic acids are NOT DETECTED.  The SARS-CoV-2 RNA is generally detectable in upper respiratoy specimens during the acute phase of infection. The lowest concentration of SARS-CoV-2 viral copies this assay can detect is 131 copies/mL. A negative result does not preclude SARS-Cov-2 infection and should not be used as the sole basis for treatment or other patient management decisions. A negative result may occur with  improper specimen collection/handling, submission of specimen other than nasopharyngeal swab, presence of viral mutation(s) within the areas targeted by this assay, and inadequate number of viral copies (<131 copies/mL). A negative result must be combined with clinical observations, patient history, and epidemiological information.  The expected result is Negative.  Fact Sheet for Patients:  https://www.moore.com/https://www.fda.gov/media/142436/download  Fact Sheet for Healthcare Providers:  https://www.young.biz/https://www.fda.gov/media/142435/download  This test is no t yet approved or cleared by the [place] FDA and  has been authorized for detection and/or diagnosis of SARS-CoV-2 by FDA under an Emergency Use Authorization (EUA). This EUA will remain  in effect (meaning this test can be used) for the duration of the COVID-19 declaration under Section 564(b)(1) of the Act, 21 U.S.C. section 360bbb-3(b)(1), unless the authorization is terminated or revoked sooner.     Influenza A by PCR NEGATIVE NEGATIVE Final   Influenza B by PCR NEGATIVE NEGATIVE Final    Comment: (NOTE) The Xpert Xpress SARS-CoV-2/FLU/RSV assay is intended as an aid in  the diagnosis of influenza from Nasopharyngeal swab specimens and  should not be used as a sole basis for treatment. Nasal washings and  aspirates are unacceptable for Xpert Xpress SARS-CoV-2/FLU/RSV  testing.  Fact Sheet for Patients: https://www.moore.com/https://www.fda.gov/media/142436/download  Fact Sheet for Healthcare Providers: https://www.young.biz/https://www.fda.gov/media/142435/download  This test is not yet approved or cleared by the [place] FDA and  has been authorized for detection and/or diagnosis of SARS-CoV-2 by  FDA under an Emergency Use Authorization (EUA). This EUA will remain  in effect (meaning this test can be used) for the duration of the  Covid-19 declaration under Section 564(b)(1) of the Act, 21  U.S.C. section 360bbb-3(b)(1), unless the authorization is  terminated or revoked. Performed at [facility], [address] [place], [place] [address]      Radiology Studies: US RENAL  Result Date: 04/15/2020 CLINICAL DATA:  Acute kidney injury EXAM: RENAL / URINARY TRACT ULTRASOUND COMPLETE COMPARISON:  CT abdomen pelvis 04/13/2020 FINDINGS: Right Kidney: Renal measurements: 9.5 x 5.7 x 5.4  cm = volume: 153 mL. Echogenicity within normal limits. No mass or hydronephrosis visualized. Left Kidney: Renal measurements: 9.7 by 6.0 x 6.0 cm = volume: 186 mL. Echogenicity within normal limits. No mass or hydronephrosis visualized. Bladder: Not visualized. Other: Ascites. IMPRESSION: 1.  Unremarkable sonographic appearance of the bilateral kidneys. 2.  Bladder could not be definitely visualized. Electronically Signed   By: Emmaline KluverNancy  Ballantyne M.D.   On: 04/15/2020 15:54   DG CHEST PORT 1 VIEW  Result Date: 04/15/2020 CLINICAL DATA:  Pleural effusions,  recurrent ascites, chronic hepatitis-B EXAM: PORTABLE CHEST 1 VIEW COMPARISON:  04/13/2020 chest radiograph. FINDINGS: Stable cardiomediastinal silhouette with normal heart size. No pneumothorax. Slight blunting of the costophrenic angles bilaterally, cannot exclude trace pleural effusions. No pulmonary edema. Minimal left basilar atelectasis. IMPRESSION: Slight blunting of the costophrenic angles bilaterally, cannot exclude trace pleural effusions. Minimal left basilar atelectasis. Electronically Signed   By: Delbert Phenix M.D.   On: 04/15/2020 17:19   US LIVER DOPPLER  Result Date: 04/16/2020 CLINICAL DATA:  Ascites, abnormal LFTs, hepatic steatosis EXAM: DUPLEX ULTRASOUND OF LIVER TECHNIQUE: Color and duplex Doppler ultrasound was performed to evaluate the hepatic in-flow and out-flow vessels. COMPARISON:  06/23/2018 CT with contrast FINDINGS: Liver: Slight increased echogenicity. Normal hepatic contour without nodularity. No focal lesion, mass or intrahepatic biliary ductal dilatation. Main Portal Vein size: 1.0 cm Portal Vein Velocities Main Prox:  15 cm/sec Main Mid: 18 cm/sec Main Dist:  13 cm/sec Right: 8.0 cm/sec Left: 8.0 cm/sec Hepatic Vein Velocities Right:  18 cm/sec Middle:  14 cm/sec Left:  6 cm/sec IVC: Present and patent with normal respiratory phasicity. Hepatic Artery Velocity:  36 cm/sec Splenic Vein Velocity:  15 cm/sec Spleen: 7.6 cm x  8.1 cm x 7.1 cm with a total volume of 229 cm^3 (411 cm^3 is upper limit normal) Portal Vein Occlusion/Thrombus: No Splenic Vein Occlusion/Thrombus: No Ascites: Small amount of upper abdominal ascites Varices: Not visualized Patent portal, hepatic and splenic veins with normal directional flow. Negative for portal vein occlusion or thrombus. Along the gallbladder neck anterior wall, there is a echogenic slightly lobulated intraluminal abnormality or mass measuring 1.4 x 1.1 x 1.0 cm without shadowing. This remains nonspecific and may represent adherent gallbladder sludge. Consider follow-up short-term right upper quadrant ultrasound. IMPRESSION: Normal hepatic venous Doppler. Suspect mild hepatic steatosis Perihepatic small volume of ascites 1.4 cm gallbladder neck anterior wall echogenic nodular abnormality, indeterminate but may represent adherent gallbladder sludge. Consider short-term follow-up right upper quadrant ultrasound at 3 months and if persistent consider further imaging with MRI. Electronically Signed   By: Judie Petit.  Shick M.D.   On: 04/16/2020 12:56    Scheduled Meds: . Chlorhexidine Gluconate Cloth  6 each Topical Daily  . heparin  5,000 Units Subcutaneous Q8H  . pantoprazole  40 mg Oral Daily   Continuous Infusions: . sodium chloride 75 mL/hr at 04/16/20 0434     LOS: 3 days   Rickey Barbara, MD [facility] Pager On Amion  If 7PM-7AM, please contact night-coverage 04/16/2020, 4:03 PM

## 2020-04-16 NOTE — Progress Notes (Signed)
Attempted in and out cath due to bladder scan of 521. Met resistance on 2 separate catheters with only a small amount of urine coming out (200cc). Bladder scan again and noted 417 ml. Foley catheter inserted and encouraged pt to relax, as the pt is VERY tense and pushing the catheter back out. Will watch the Foley, as more urine has come with this catheter--75cc. Have attempted to chat with MD. It is past shift change and will attempt to reach someone on call. Pt resting quietly now. Urine slowly coming down the catheter tube with gravity bag attached. Am currently reporting off to oncoming nurse.  She will reassess and reach out to oncall MD.

## 2020-04-17 DIAGNOSIS — R188 Other ascites: Secondary | ICD-10-CM | POA: Diagnosis not present

## 2020-04-17 LAB — COMPREHENSIVE METABOLIC PANEL
ALT: 18 U/L (ref 0–44)
AST: 17 U/L (ref 15–41)
Albumin: 2.9 g/dL — ABNORMAL LOW (ref 3.5–5.0)
Alkaline Phosphatase: 39 U/L (ref 38–126)
Anion gap: 7 (ref 5–15)
BUN: 9 mg/dL (ref 6–20)
CO2: 23 mmol/L (ref 22–32)
Calcium: 7.9 mg/dL — ABNORMAL LOW (ref 8.9–10.3)
Chloride: 106 mmol/L (ref 98–111)
Creatinine, Ser: 0.64 mg/dL (ref 0.44–1.00)
GFR, Estimated: >60 mL/min (ref >60)
Glucose, Bld: 88 mg/dL (ref 70–99)
Potassium: 3.7 mmol/L (ref 3.5–5.1)
Sodium: 136 mmol/L (ref 135–145)
Total Bilirubin: 0.7 mg/dL (ref 0.3–1.2)
Total Protein: 5.7 g/dL — ABNORMAL LOW (ref 6.5–8.1)

## 2020-04-17 LAB — BODY FLUID CULTURE: Culture: NO GROWTH

## 2020-04-17 LAB — CYTOLOGY - NON PAP

## 2020-04-17 MED ORDER — KETOROLAC TROMETHAMINE 30 MG/ML IJ SOLN
30.0000 mg | Freq: Once | INTRAMUSCULAR | Status: AC
  Administered 2020-04-17: 30 mg via INTRAVENOUS
  Filled 2020-04-17: qty 1

## 2020-04-17 MED ORDER — HEPARIN SODIUM (PORCINE) 5000 UNIT/ML IJ SOLN
5000.0000 [IU] | Freq: Three times a day (TID) | INTRAMUSCULAR | Status: DC
  Administered 2020-04-18 – 2020-04-19 (×2): 5000 [IU] via SUBCUTANEOUS
  Filled 2020-04-17 (×4): qty 1

## 2020-04-17 MED ORDER — SPIRONOLACTONE 25 MG PO TABS
25.0000 mg | ORAL_TABLET | Freq: Every day | ORAL | Status: DC
  Administered 2020-04-17 – 2020-04-20 (×4): 25 mg via ORAL
  Filled 2020-04-17 (×4): qty 1

## 2020-04-17 MED ORDER — FUROSEMIDE 20 MG PO TABS
20.0000 mg | ORAL_TABLET | Freq: Every day | ORAL | Status: DC
  Administered 2020-04-17 – 2020-04-20 (×4): 20 mg via ORAL
  Filled 2020-04-17 (×4): qty 1

## 2020-04-17 NOTE — Consult Note (Signed)
Chief Complaint: Patient was seen in consultation today for image guided transjugular liver biopsy Chief Complaint  Patient presents with  . Constipation  . Generalized Body Aches    Referring Physician(s): Beavers,K  Supervising Physician: Arne Cleveland  Patient Status: [facility] - In-pt  History of Present Illness: Kim Pacheco is a 34 y.o. female with past medical history significant for anxiety, depression, alcohol abuse ,diabetes, chronic hepatitis B, and recurrent ascites.She was recently admitted to [facility] with increasing abdominal girth and found to have symptomatic ascites and worsening renal failure.  She underwent diagnostic paracentesis on 04/14/2020 with negative cytology, negative culture and SAAG consistent with portal hypertension.  CT revealed ascites and borderline hepatic steatosis but no other focal liver abnormality. 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  Patient had normal hepatic venous Doppler study on 11/7 and 1.4 cm gallbladder neck anterior wall echogenic nodule abnormality indeterminate but could represent adherent gallbladder sludge. Current labs include potassium 3.7, creatinine 0.64, total bilirubin/AST/ALT and alk phos normal, WBC/hemoglobin/platelets/PT/INR all normal. AFP 7.9. COVID 19 neg. request now received from GI service for transjugular liver biopsy with portal/hepatic venous pressure measurements. Past Medical History:  Diagnosis Date  . Abdominal pain    started in mid abd and spread to left side/on and off/since Jan 2020/ pt states, she has fluid in abd/unknown  . Abnormal Pap smear    normal 06/2013  . Anxiety   . Constipation    hx of  . Depression    suicide attempt in college  . Diabetes mellitus without complication (HCC)    gestational, no issues since pregnancy per her report - resolved  . Gestational diabetes    /no meds now - resolved  . Hepatitis    Hep B, managed by Dr. Earlean Shawl  . History of chicken pox   .  History of UTI   . Vaginal Pap smear, abnormal     Past Surgical History:  Procedure Laterality Date  . CESAREAN SECTION N/A 01/26/2013   Procedure: Primary Cesarean Section Delivery Baby  Boy @ 0017, Apgars 4/8/9;  Surgeon: Farrel Gobble. Harrington Challenger, MD;  Location: [facility];  Service: Obstetrics;  Laterality: N/A;  . CESAREAN SECTION N/A 11/18/2017   Procedure: CESAREAN SECTION;  Surgeon: Brien Few, MD;  Location: [place];  Service: Obstetrics;  Laterality: N/A;  . COLONOSCOPY  2020  . ESOPHAGOGASTRODUODENOSCOPY ENDOSCOPY    . laproscopy  2020  . NASAL SINUS SURGERY  1996  . WISDOM TOOTH EXTRACTION      Allergies: Contrast media [iodinated diagnostic agents]  Medications: Prior to Admission medications   Medication Sig Start Date End Date Taking? Authorizing Provider  docusate sodium (COLACE) 100 MG capsule Take 1 capsule (100 mg total) by mouth 2 (two) times daily. 04/12/20  Yes Koberlein, Steele Berg, MD  HYDROcodone-acetaminophen (NORCO/VICODIN) 5-325 MG tablet Take 1 tablet by mouth every 6 (six) hours as needed. Patient taking differently: Take 1 tablet by mouth every 6 (six) hours as needed for moderate pain or severe pain.  04/10/20  Yes Dorie Rank, MD  ibuprofen (ADVIL) 200 MG tablet Take 200 mg by mouth every 6 (six) hours as needed for moderate pain.   Yes [provider]  ondansetron (ZOFRAN ODT) 4 MG disintegrating tablet Take 1 tablet (4 mg total) by mouth every 8 (eight) hours as needed for nausea or vomiting. 04/12/20  Yes Koberlein, Junell C, MD  polyethylene glycol (GOLYTELY) 236 g solution Drink 8 oz solution every 1-2 hours  as needed for constipation 04/12/20  Yes Koberlein, Steele Berg, MD     Family History  Adopted: Yes    Social History   Socioeconomic History  . Marital status: Married    Spouse name: Not on file  . Number of children: 1  . Years of education: college  . Highest education level: Not on file  Occupational History  . Occupation:  Geophysicist/field seismologist  Tobacco Use  . Smoking status: Former Smoker    Packs/day: 1.00    Types: Cigarettes    Quit date: 07/23/2011    Years since quitting: 8.7  . Smokeless tobacco: Never Used  . Tobacco comment: smoked for 8 years, quit in 2013  Vaping Use  . Vaping Use: Never used  Substance and Sexual Activity  . Alcohol use: Yes    Alcohol/week: 5.0 standard drinks    Types: 5 Glasses of wine per week  . Drug use: Not Currently    Types: Marijuana    Comment: Last use 12/19/18  . Sexual activity: Yes  Other Topics Concern  . Not on file  Social History Narrative   Work or School: dog show - Corporate treasurer      Home Situation: lives with husband, son born in 01/2013      Spiritual Beliefs: none      Lifestyle: no regular exercise; diet is fair - [place] diet   Social Determinants of Health   Financial Resource Strain:   . Difficulty of Paying Living Expenses: Not on file  Food Insecurity:   . Worried About Charity fundraiser in the Last Year: Not on file  . Ran Out of Food in the Last Year: Not on file  Transportation Needs:   . Lack of Transportation (Medical): Not on file  . Lack of Transportation (Non-Medical): Not on file  Physical Activity:   . Days of Exercise per Week: Not on file  . Minutes of Exercise per Session: Not on file  Stress:   . Feeling of Stress : Not on file  Social Connections:   . Frequency of Communication with Friends and Family: Not on file  . Frequency of Social Gatherings with Friends and Family: Not on file  . Attends Religious Services: Not on file  . Active Member of Clubs or Organizations: Not on file  . Attends Archivist Meetings: Not on file  . Marital Status: Not on file      Review of Systems denies fever, worsening dyspnea, cough, back pain, vomiting or bleeding.  She does have occasional headaches, some lower anterior chest discomfort, abdominal discomfort and occasional nausea.  Vital Signs: BP 127/83 (BP  Location: Right Arm)   Pulse (!) 59   Temp 97.7 F (36.5 C)   Resp 17   Wt 139 lb 1.8 oz (63.1 kg)   LMP 02/29/2020   SpO2 98%   BMI 27.17 kg/m   Physical Exam awake, alert.  Chest with diminished breath sounds bases.  Heart with regular rate and rhythm.  Abdomen with some mild distention, soft, positive bowel sounds, some mild generalized tenderness to palpation.  No lower extremity edema.  Imaging: CT ABDOMEN PELVIS WO CONTRAST  Result Date: 04/13/2020 CLINICAL DATA:  Acute severe nonlocalized abdominal pain with rebound tenderness. Concern for acute abdomen. Recent diagnosis of ruptured ovarian cyst. EXAM: CT ABDOMEN AND PELVIS WITHOUT CONTRAST TECHNIQUE: Multidetector CT imaging of the abdomen and pelvis was performed following the standard protocol without IV contrast. COMPARISON:  Abdominal CT and  pelvic ultrasound 6 days ago 04/07/2020. CT 06/26/2018 also reviewed. FINDINGS: Lower chest: The lung bases are clear. No focal consolidation or pleural effusion Hepatobiliary: No focal liver abnormality is seen. Borderline hepatic steatosis. No gallstones, gallbladder wall thickening, or biliary dilatation. Pancreas: No ductal dilatation or inflammation. Spleen: Normal in size without focal abnormality. Adrenals/Urinary Tract: Normal adrenal glands. No renal stone or hydronephrosis. No perinephric edema. Urinary bladder is physiologically distended. There is a contour deformity of the bladder dome which may represent a bladder diverticulum, for example series 6, image 81. No bladder wall thickening. Stomach/Bowel: Bowel evaluation is limited in the absence of contrast and presence of intra-abdominal ascites. Ingested material within the stomach. No obvious gastric wall thickening. Decompressed small bowel without inflammation or obvious wall thickening. Liquid stool in the colon. No formed stool. The appendix is not well seen on the current exam. No evidence of appendicitis. Vascular/Lymphatic:  Abdominal aorta is normal in caliber. Duplicated IVC. No adenopathy. Reproductive: Uterus is unremarkable. Ovaries tentatively visualized and symmetric in size. No adnexal mass. Other: Moderate to large volume of abdominal and pelvic ascites, progressed from recent exam. Ascites was also seen on January 2020 CT. The etiology is indeterminate. Ascites measures simple fluid density. There is no hemorrhagic component. No free air. Musculoskeletal: There are no acute or suspicious osseous abnormalities. IMPRESSION: 1. Moderate to large volume of abdominal and pelvic ascites, progressed from recent exam. Cause for ascites is not identified. There is no hemorrhagic component nor free air. Consider fluid sampling. 2. Contour deformity of the bladder dome may represent a bladder diverticulum. 3. Duplicated IVC. Electronically Signed   By: Keith Rake M.D.   On: 04/13/2020 20:02   DG Chest 2 View  Result Date: 04/13/2020 CLINICAL DATA:  Shortness of breath EXAM: CHEST - 2 VIEW COMPARISON:  08/05/2014 FINDINGS: Cardiac shadow is within normal limits. The lungs are well aerated bilaterally. No focal infiltrate or sizable effusion is seen. No acute bony abnormality is noted. IMPRESSION: No active cardiopulmonary disease. Electronically Signed   By: Inez Catalina M.D.   On: 04/13/2020 18:44   US Transvaginal Non-OB  Result Date: 04/07/2020 CLINICAL DATA:  Pelvic pain EXAM: TRANSABDOMINAL AND TRANSVAGINAL ULTRASOUND OF PELVIS DOPPLER ULTRASOUND OF OVARIES TECHNIQUE: Both transabdominal and transvaginal ultrasound examinations of the pelvis were performed. Transabdominal technique was performed for global imaging of the pelvis including uterus, ovaries, adnexal regions, and pelvic cul-de-sac. It was necessary to proceed with endovaginal exam following the transabdominal exam to visualize the ovaries. Color and duplex Doppler ultrasound was utilized to evaluate blood flow to the ovaries. COMPARISON:  CT dated  04/07/2020 FINDINGS: Uterus Measurements: 8.8 x 3.8 x 4.7 cm = volume: 81 mL. No fibroids or other mass visualized. There is a small amount of free fluid in the lower uterine segment. There is a probable nabothian cyst measuring 5 mm. Endometrium Thickness: 9 mm.  No focal abnormality visualized. Right ovary Measurements: 3 x 2.1 x 2.3 cm = volume: 7 mL. Normal appearance/no adnexal mass. Left ovary Measurements: 2.6 x 1.7 x 1.8 cm = volume: 4 mL. Normal appearance/no adnexal mass. Pulsed Doppler evaluation of both ovaries demonstrates normal low-resistance arterial and venous waveforms. Other findings There is a small volume of pelvic free fluid. This is better appreciated on the patient's prior CT. IMPRESSION: No acute abnormality.  No evidence for ovarian torsion. Electronically Signed   By: Constance Holster M.D.   On: 04/07/2020 17:28   US Pelvis Complete  Result Date: 04/07/2020  CLINICAL DATA:  Pelvic pain EXAM: TRANSABDOMINAL AND TRANSVAGINAL ULTRASOUND OF PELVIS DOPPLER ULTRASOUND OF OVARIES TECHNIQUE: Both transabdominal and transvaginal ultrasound examinations of the pelvis were performed. Transabdominal technique was performed for global imaging of the pelvis including uterus, ovaries, adnexal regions, and pelvic cul-de-sac. It was necessary to proceed with endovaginal exam following the transabdominal exam to visualize the ovaries. Color and duplex Doppler ultrasound was utilized to evaluate blood flow to the ovaries. COMPARISON:  CT dated 04/07/2020 FINDINGS: Uterus Measurements: 8.8 x 3.8 x 4.7 cm = volume: 81 mL. No fibroids or other mass visualized. There is a small amount of free fluid in the lower uterine segment. There is a probable nabothian cyst measuring 5 mm. Endometrium Thickness: 9 mm.  No focal abnormality visualized. Right ovary Measurements: 3 x 2.1 x 2.3 cm = volume: 7 mL. Normal appearance/no adnexal mass. Left ovary Measurements: 2.6 x 1.7 x 1.8 cm = volume: 4 mL. Normal  appearance/no adnexal mass. Pulsed Doppler evaluation of both ovaries demonstrates normal low-resistance arterial and venous waveforms. Other findings There is a small volume of pelvic free fluid. This is better appreciated on the patient's prior CT. IMPRESSION: No acute abnormality.  No evidence for ovarian torsion. Electronically Signed   By: Constance Holster M.D.   On: 04/07/2020 17:28   US RENAL  Result Date: 04/15/2020 CLINICAL DATA:  Acute kidney injury EXAM: RENAL / URINARY TRACT ULTRASOUND COMPLETE COMPARISON:  CT abdomen pelvis 04/13/2020 FINDINGS: Right Kidney: Renal measurements: 9.5 x 5.7 x 5.4 cm = volume: 153 mL. Echogenicity within normal limits. No mass or hydronephrosis visualized. Left Kidney: Renal measurements: 9.7 by 6.0 x 6.0 cm = volume: 186 mL. Echogenicity within normal limits. No mass or hydronephrosis visualized. Bladder: Not visualized. Other: Ascites. IMPRESSION: 1.  Unremarkable sonographic appearance of the bilateral kidneys. 2.  Bladder could not be definitely visualized. Electronically Signed   By: Audie Pinto M.D.   On: 04/15/2020 15:54   [place] Art/Ven Flow Abd Pelv Doppler  Result Date: 04/07/2020 CLINICAL DATA:  Pelvic pain EXAM: TRANSABDOMINAL AND TRANSVAGINAL ULTRASOUND OF PELVIS DOPPLER ULTRASOUND OF OVARIES TECHNIQUE: Both transabdominal and transvaginal ultrasound examinations of the pelvis were performed. Transabdominal technique was performed for global imaging of the pelvis including uterus, ovaries, adnexal regions, and pelvic cul-de-sac. It was necessary to proceed with endovaginal exam following the transabdominal exam to visualize the ovaries. Color and duplex Doppler ultrasound was utilized to evaluate blood flow to the ovaries. COMPARISON:  CT dated 04/07/2020 FINDINGS: Uterus Measurements: 8.8 x 3.8 x 4.7 cm = volume: 81 mL. No fibroids or other mass visualized. There is a small amount of free fluid in the lower uterine segment. There is a probable  nabothian cyst measuring 5 mm. Endometrium Thickness: 9 mm.  No focal abnormality visualized. Right ovary Measurements: 3 x 2.1 x 2.3 cm = volume: 7 mL. Normal appearance/no adnexal mass. Left ovary Measurements: 2.6 x 1.7 x 1.8 cm = volume: 4 mL. Normal appearance/no adnexal mass. Pulsed Doppler evaluation of both ovaries demonstrates normal low-resistance arterial and venous waveforms. Other findings There is a small volume of pelvic free fluid. This is better appreciated on the patient's prior CT. IMPRESSION: No acute abnormality.  No evidence for ovarian torsion. Electronically Signed   By: Constance Holster M.D.   On: 04/07/2020 17:28   DG CHEST PORT 1 VIEW  Result Date: 04/15/2020 CLINICAL DATA:  Pleural effusions, recurrent ascites, chronic hepatitis-B EXAM: PORTABLE CHEST 1 VIEW COMPARISON:  04/13/2020 chest radiograph. FINDINGS:  Stable cardiomediastinal silhouette with normal heart size. No pneumothorax. Slight blunting of the costophrenic angles bilaterally, cannot exclude trace pleural effusions. No pulmonary edema. Minimal left basilar atelectasis. IMPRESSION: Slight blunting of the costophrenic angles bilaterally, cannot exclude trace pleural effusions. Minimal left basilar atelectasis. Electronically Signed   By: Ilona Sorrel M.D.   On: 04/15/2020 17:19   DG Abd 2 Views  Result Date: 04/12/2020 CLINICAL DATA:  Abdominal pain for 5 days. EXAM: ABDOMEN - 2 VIEW COMPARISON:  Abdominal CT and pelvic ultrasound 04/07/2020, 5 days ago. FINDINGS: No free intra-abdominal air. No bowel dilatation to suggest obstruction. There is a moderate volume of stool in the ascending, transverse, and descending colon. No abnormal rectal distension. Pelvic calcifications are unchanged from prior exam and consistent with phleboliths. No radiopaque calculi. The lung bases are clear. No osseous abnormalities are seen. IMPRESSION: Normal bowel gas pattern. Moderate colonic stool burden. Electronically Signed   By:  Keith Rake M.D.   On: 04/12/2020 15:28   ECHOCARDIOGRAM COMPLETE  Result Date: 04/14/2020    ECHOCARDIOGRAM REPORT   Patient Name:   JORDEN MAHL Date of Exam: 04/14/2020 Medical Rec #:  [id number]               Height:       60.0 in Accession #:    [id number]              Weight:       134.1 lb Date of Birth:  Apr 14, 1986               BSA:          1.575 m Patient Age:    34 years                BP:           119/86 mmHg Patient Gender: F                       HR:           81 bpm. Exam Location:  Inpatient Procedure: 2D Echo Indications:    CHF-Acute Diastolic 076.22 / Q33.35  History:        Patient has no prior history of Echocardiogram examinations.                 Past medical history of hepatitis B, ascites of unclear                 etiology.  Sonographer:    Darlina Sicilian RDCS Referring Phys: [phone] STEPHEN K CHIU  Sonographer Comments: Technically difficult study due to poor echo windows. Exam ended per patients request. IMPRESSIONS  1. Left ventricular ejection fraction, by estimation, is 55 to 60%. The left ventricle has normal function. The left ventricle has no regional wall motion abnormalities. Left ventricular diastolic function could not be evaluated.  2. Right ventricular systolic function is normal. The right ventricular size is normal.  3. Large pleural effusion.  4. The mitral valve is normal in structure. No evidence of mitral valve regurgitation.  5. The aortic valve is normal in structure. Aortic valve regurgitation is not visualized. FINDINGS  Left Ventricle: Left ventricular ejection fraction, by estimation, is 55 to 60%. The left ventricle has normal function. The left ventricle has no regional wall motion abnormalities. The left ventricular internal cavity size was normal in size. There is  no left ventricular hypertrophy. Left ventricular diastolic function could not be evaluated.  Right Ventricle: The right ventricular size is normal. Right vetricular wall thickness  was not assessed. Right ventricular systolic function is normal. Left Atrium: Left atrial size was normal in size. Right Atrium: Right atrial size was normal in size. Pericardium: There is no evidence of pericardial effusion. Mitral Valve: The mitral valve is normal in structure. No evidence of mitral valve regurgitation. Tricuspid Valve: The tricuspid valve is normal in structure. Tricuspid valve regurgitation is trivial. Aortic Valve: The aortic valve is normal in structure. Aortic valve regurgitation is not visualized. Pulmonic Valve: The pulmonic valve was normal in structure. Pulmonic valve regurgitation is not visualized. Aorta: The aortic root is normal in size and structure. IAS/Shunts: The interatrial septum was not assessed. Additional Comments: There is a large pleural effusion.  LEFT VENTRICLE PLAX 2D LVIDd:         3.50 cm  Diastology LVIDs:         2.40 cm  LV e' medial:    5.22 cm/s LV PW:         0.80 cm  LV E/e' medial:  9.3 LV IVS:        0.70 cm  LV e' lateral:   6.42 cm/s LVOT diam:     1.80 cm  LV E/e' lateral: 7.5 LV SV:         41 LV SV Index:   26 LVOT Area:     2.54 cm  RIGHT VENTRICLE RV S prime:     8.05 cm/s LEFT ATRIUM             Index       RIGHT ATRIUM          Index LA diam:        3.10 cm 1.97 cm/m  RA Area:     7.94 cm LA Vol (A2C):   17.8 ml 11.30 ml/m RA Volume:   12.60 ml 8.00 ml/m LA Vol (A4C):   22.3 ml 14.16 ml/m LA Biplane Vol: 20.0 ml 12.70 ml/m  AORTIC VALVE LVOT Vmax:   86.80 cm/s LVOT Vmean:  56.600 cm/s LVOT VTI:    0.161 m  AORTA Ao Root diam: 2.30 cm MITRAL VALVE MV Area (PHT): 2.99 cm    SHUNTS MV Decel Time: 254 msec    Systemic VTI:  0.16 m MV E velocity: 48.40 cm/s  Systemic Diam: 1.80 cm MV A velocity: 66.80 cm/s MV E/A ratio:  0.72 Dorris Carnes MD Electronically signed by Dorris Carnes MD Signature Date/Time: 04/14/2020/7:29:20 PM    Final    CT Renal Stone Study  Result Date: 04/07/2020 CLINICAL DATA:  Lower abdominal and back region pain.  Dysuria.  EXAM: CT ABDOMEN AND PELVIS WITHOUT CONTRAST TECHNIQUE: Multidetector CT imaging of the abdomen and pelvis was performed following the standard protocol without oral or IV contrast. COMPARISON:  June 26, 2018 FINDINGS: Lower chest: There is mild bibasilar atelectasis. There is no lung base edema or consolidation. Hepatobiliary: There is hepatic steatosis. No focal liver lesions are evident. Gallbladder wall is not appreciably thickened. There is no biliary duct dilatation. Pancreas: There is no pancreatic mass or inflammatory focus. Spleen: No splenic lesions are evident. Adrenals/Urinary Tract: Adrenals bilaterally appear normal. Kidneys bilaterally show no evident mass or hydronephrosis on either side. There is no appreciable renal or ureteral calculus. Urinary bladder is midline with wall thickness within normal limits. Stomach/Bowel: There is no appreciable bowel wall or mesenteric thickening. No evident bowel obstruction. The terminal ileum appears normal. There is no  appreciable free air or portal venous air. Vascular/Lymphatic: There is no abdominal aortic aneurysm. No vascular lesions are evident on this noncontrast enhanced study. Reproductive: The uterus is anteverted. No adnexal mass. There is moderate fluid in the cul-de-sac. Other: The appendix appears normal. No evident abscess in the abdomen or pelvis. There is no ascites evident beyond the fluid in the cul-de-sac. Musculoskeletal: No blastic or lytic bone lesions. No appreciable abdominal wall or intramuscular lesions. IMPRESSION: 1. Moderate fluid in the cul-de-sac. Suspect recent ovarian cyst rupture. No adnexal masses evident by CT. 2. No evident renal or ureteral calculus on either side. No hydronephrosis. Urinary bladder wall thickness normal. 3. No bowel wall thickening or bowel obstruction. No abscess in the abdomen or pelvis. Appendix appears normal. 4.  Hepatic steatosis. Electronically Signed   By: Lowella Grip III M.D.   On:  04/07/2020 15:40   US LIVER DOPPLER  Result Date: 04/16/2020 CLINICAL DATA:  Ascites, abnormal LFTs, hepatic steatosis EXAM: DUPLEX ULTRASOUND OF LIVER TECHNIQUE: Color and duplex Doppler ultrasound was performed to evaluate the hepatic in-flow and out-flow vessels. COMPARISON:  06/23/2018 CT with contrast FINDINGS: Liver: Slight increased echogenicity. Normal hepatic contour without nodularity. No focal lesion, mass or intrahepatic biliary ductal dilatation. Main Portal Vein size: 1.0 cm Portal Vein Velocities Main Prox:  15 cm/sec Main Mid: 18 cm/sec Main Dist:  13 cm/sec Right: 8.0 cm/sec Left: 8.0 cm/sec Hepatic Vein Velocities Right:  18 cm/sec Middle:  14 cm/sec Left:  6 cm/sec IVC: Present and patent with normal respiratory phasicity. Hepatic Artery Velocity:  36 cm/sec Splenic Vein Velocity:  15 cm/sec Spleen: 7.6 cm x 8.1 cm x 7.1 cm with a total volume of 229 cm^3 (411 cm^3 is upper limit normal) Portal Vein Occlusion/Thrombus: No Splenic Vein Occlusion/Thrombus: No Ascites: Small amount of upper abdominal ascites Varices: Not visualized Patent portal, hepatic and splenic veins with normal directional flow. Negative for portal vein occlusion or thrombus. Along the gallbladder neck anterior wall, there is a echogenic slightly lobulated intraluminal abnormality or mass measuring 1.4 x 1.1 x 1.0 cm without shadowing. This remains nonspecific and may represent adherent gallbladder sludge. Consider follow-up short-term right upper quadrant ultrasound. IMPRESSION: Normal hepatic venous Doppler. Suspect mild hepatic steatosis Perihepatic small volume of ascites 1.4 cm gallbladder neck anterior wall echogenic nodular abnormality, indeterminate but may represent adherent gallbladder sludge. Consider short-term follow-up right upper quadrant ultrasound at 3 months and if persistent consider further imaging with MRI. Electronically Signed   By: Jerilynn Mages.  Shick M.D.   On: 04/16/2020 12:56    Labs:  CBC: Recent  Labs    04/10/20 1153 04/13/20 1830 04/14/20 0419 04/15/20 0520  WBC 5.8 8.0 8.6 6.3  HGB 14.1 15.2* 14.8 12.9  HCT 42.5 43.8 43.5 38.1  PLT 229 270 270 231    COAGS: Recent Labs    04/14/20 0419  INR 1.0    BMP: Recent Labs    04/14/20 0419 04/15/20 0520 04/16/20 0437 04/17/20 0354  NA 129* 136 140 136  K 4.7 4.2 4.0 3.7  CL 97* 106 110 106  CO2 18* 19* 22 23  GLUCOSE 99 93 85 88  BUN 48* 28* 11 9  CALCIUM 8.7* 8.7* 8.0* 7.9*  CREATININE 4.22* 1.81* 0.61 0.64  GFRNONAA 13* 37* >60 >60    LIVER FUNCTION TESTS: Recent Labs    04/14/20 0419 04/15/20 0520 04/16/20 0437 04/17/20 0354  BILITOT 1.3* 1.3* 0.8 0.7  AST _0 ALT 30  _0 ALKPHOS 59 46 39 39  PROT 8.9* 7.4 5.7* 5.7*  ALBUMIN 4.6 3.7 2.9* 2.9*    TUMOR MARKERS: No results for input(s): AFPTM, CEA, CA199, CHROMGRNA in the last 8760 hours.  Assessment and Plan: 34 y.o. female with past medical history significant for anxiety, depression, alcohol abuse ,diabetes, chronic hepatitis B, and recurrent ascites.She was recently admitted to [facility] with increasing abdominal girth and found to have symptomatic ascites and worsening renal failure.  She underwent diagnostic paracentesis on 04/14/2020 with negative cytology, negative culture and SAAG consistent with portal hypertension.  CT revealed ascites and borderline hepatic steatosis but no other focal liver abnormality. 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  Patient had normal hepatic venous Doppler study on 11/7 and 1.4 cm gallbladder neck anterior wall echogenic nodule abnormality indeterminate but could represent adherent gallbladder sludge. Current labs include potassium 3.7, creatinine 0.64, total bilirubin/AST/ALT and alk phos normal, WBC/hemoglobin/platelets/PT/INR all normal. AFP 7.9. COVID 19 neg. Request now received from GI service for transjugular liver biopsy with portal/hepatic venous pressure measurements.  Patient has reported  history of contrast allergy with rash and dyspnea; will therefore utilize CO2 during procedure tomorrow.  Details/risks of procedure, including but not limited to, internal bleeding, infection, injury to adjacent structures discussed with patient with her understanding and consent.   Thank you for this interesting consult.  I greatly enjoyed meeting Kim Pacheco and look forward to participating in their care.  A copy of this report was sent to the requesting provider on this date.  Electronically Signed: D. Rowe Robert, PA-C 04/17/2020, 3:59 PM   I spent a total of  30 minutes   in face to face in clinical consultation, greater than 50% of which was counseling/coordinating care for image guided transjugular liver biopsy

## 2020-04-17 NOTE — Progress Notes (Signed)
PROGRESS NOTE    Kim Pacheco  NFA:[id number] DOB: [birth year] DOA: 04/13/2020 PCP: Wynn Banker, MD    Brief Narrative:  34yo with hx of hx hep B, depression, ETOH abuse (last intake was one week prior to visit), constipation who presented with increasing abd girth, found to have symptomatic ascites and worsening renal failure  Assessment & Plan:   Principal Problem:   ARF (acute renal failure) (HCC) Active Problems:   Hepatitis B   Ascites   Abdominal pain   1. Acute renal failure/hyponatremia:  -Creatinine was 0.84 on November 1, rising to 3.77 on day of admission -Cr up to over 4 today -Initially consulted Nephrology for assistance, appreciate recs.  -Concern for possible dehydration with underlying ATN from NSAIDS with portal HTN  -Also noted to have evidence of bladder outlet obstruction -Renal function has normalized with foley cath and IVF hydration -Nephrology has since signed off -Overnight, pt noted to have bladder outlet obstruction now requiring indwelling cath -Recommend continued foley cath through discharge and close follow up with Urology  2. Ascites:  -Uncertain etiology -SAAG noted to be >1.1, suggesting underlying portal hypertension in the setting of ETOH use and hep B -Appreciate input by GI.  -2d echo ordered and performed, reviewed. Normal LVEF. Note of large pleural effusion mentioned on report, see below -AFP levels unremarkable  -continue to avoid hepatotoxic agents -Liver doppler reviewed, normal hepatic venous doppler, suspect mild hepatic steatosis with small volume perihepatic ascites. Incidental 1.4cm gallbladder neck abnormality may represent adherent sludge. Rec for f/u RUQ [place] at 3 months and if persistent, further imaging with MRI -Will recheck LFT in AM -Liver biopsy ordered by GI  3. History of alcohol abuse:  -Pt had noted drinking 3 to 4 glasses of wine daily, last intake was one week prior -Cont to monitor for  withdrawal symptoms, although pt is outside withdrawal window at this time -No withdrawal symptoms this visit  4.  History of hepatitis B:  -Reportedly had followed a [facility].   -Pt reports not have taken antiviral previously.   5. Possible pleural effusion -Noted on 2d echo -Recent CXR on 11/4, reviewed, was clear -Lungs had remained clear B at the bases with good air movement -Repeat CXR reviewed, only trace effusion can be appreciated -Remains on room air  DVT prophylaxis: Heparin subq Code Status: Full Family Communication: Pt in room, family not at bedside  Status is: Inpatient  Remains inpatient appropriate because:Unsafe d/c plan, IV treatments appropriate due to intensity of illness or inability to take PO and Inpatient level of care appropriate due to severity of illness   Dispo: The patient is from: Home              Anticipated d/c is to: Home              Anticipated d/c date is: > 3 days              Patient currently is not medically stable to d/c.   Consultants:   GI  Nephrology  Procedures:     Antimicrobials: Anti-infectives (From admission, onward)   None      Subjective: Had headache this AM  Objective: Vitals:   04/16/20 1958 04/17/20 0500 04/17/20 0513 04/17/20 1252  BP: 120/85  108/70 127/83  Pulse: 68  67 (!) 59  Resp: 20  18 17   Temp: 98.9 F (37.2 C)  98.4 F (36.9 C) 97.7 F (36.5 C)  TempSrc:  Oral  Oral   SpO2: 98%  98% 98%  Weight:  63.1 kg      Intake/Output Summary (Last 24 hours) at 04/17/2020 1647 Last data filed at 04/17/2020 1358 Gross per 24 hour  Intake 3085.64 ml  Output 5575 ml  Net -2489.36 ml   Filed Weights   04/15/20 0500 04/16/20 0439 04/17/20 0500  Weight: 61.2 kg 62.5 kg 63.1 kg    Examination: General exam: Conversant, in no acute distress Respiratory system: normal chest rise, clear, no audible wheezing Cardiovascular system: regular rhythm, s1-s2 Gastrointestinal system:  Nondistended, nontender, pos BS Central nervous system: No seizures, no tremors Extremities: No cyanosis, no joint deformities Skin: No rashes, no pallor Psychiatry: Affect normal // no auditory hallucinations   Data Reviewed: I have personally reviewed following labs and imaging studies  CBC: Recent Labs  Lab 04/13/20 1830 04/14/20 0419 04/15/20 0520  WBC 8.0 8.6 6.3  NEUTROABS 4.9  --   --   HGB 15.2* 14.8 12.9  HCT 43.8 43.5 38.1  MCV 89.9 90.1 90.9  PLT 270 270 231   Basic Metabolic Panel: Recent Labs  Lab 04/13/20 1830 04/14/20 0419 04/15/20 0520 04/16/20 0437 04/17/20 0354  NA 130* 129* 136 140 136  K 4.0 4.7 4.2 4.0 3.7  CL 91* 97* 106 110 106  CO2 24 18* 19* 22 23  GLUCOSE 102* 99 93 85 88  BUN 41* 48* 28* 11 9  CREATININE 3.77* 4.22* 1.81* 0.61 0.64  CALCIUM 9.3 8.7* 8.7* 8.0* 7.9*  MG  --   --  2.1  --   --    GFR: Estimated Creatinine Clearance: 80.6 mL/min (by C-G formula based on SCr of 0.64 mg/dL). Liver Function Tests: Recent Labs  Lab 04/13/20 1830 04/14/20 0419 04/15/20 0520 04/16/20 0437 04/17/20 0354  AST 22 23 16 17 17   ALT 31 30 23 19 18   ALKPHOS 60 59 46 39 39  BILITOT 1.5* 1.3* 1.3* 0.8 0.7  PROT 10.2* 8.9* 7.4 5.7* 5.7*  ALBUMIN 5.2* 4.6 3.7 2.9* 2.9*   Recent Labs  Lab 04/13/20 1830  LIPASE 28   No results for input(s): AMMONIA in the last 168 hours. Coagulation Profile: Recent Labs  Lab 04/14/20 0419  INR 1.0   Cardiac Enzymes: No results for input(s): CKTOTAL, CKMB, CKMBINDEX, TROPONINI in the last 168 hours. BNP (last 3 results) No results for input(s): PROBNP in the last 8760 hours. HbA1C: No results for input(s): HGBA1C in the last 72 hours. CBG: No results for input(s): GLUCAP in the last 168 hours. Lipid Profile: No results for input(s): CHOL, HDL, LDLCALC, TRIG, CHOLHDL, LDLDIRECT in the last 72 hours. Thyroid Function Tests: No results for input(s): TSH, T4TOTAL, FREET4, T3FREE, THYROIDAB in the last 72  hours. Anemia Panel: No results for input(s): VITAMINB12, FOLATE, FERRITIN, TIBC, IRON, RETICCTPCT in the last 72 hours. Sepsis Labs: No results for input(s): PROCALCITON, LATICACIDVEN in the last 168 hours.  Recent Results (from the past 240 hour(s))  Body fluid culture     Status: None   Collection Time: 04/13/20  9:10 PM   Specimen: Peritoneal Cavity; Peritoneal Fluid  Result Value Ref Range Status   Specimen Description   Final    PERITONEAL CAVITY Performed at [facility], [address] [place], [place]    Special Requests   Final    NONE Performed at [facility], [address] [place], [place]    Gram Stain  Final    WBC PRESENT, PREDOMINANTLY PMN NO ORGANISMS SEEN CYTOSPIN SMEAR    Culture   Final    NO GROWTH 3 DAYS Performed at [facility], [address], [place], [place] [address]    Report Status 04/17/2020 FINAL  Final  Respiratory Panel by RT PCR (Flu A&B, Covid) - Nasopharyngeal Swab     Status: None   Collection Time: 04/13/20 10:24 PM   Specimen: Nasopharyngeal Swab  Result Value Ref Range Status   SARS Coronavirus 2 by RT PCR NEGATIVE NEGATIVE Final    Comment: (NOTE) SARS-CoV-2 target nucleic acids are NOT DETECTED.  The SARS-CoV-2 RNA is generally detectable in upper respiratoy specimens during the acute phase of infection. The lowest concentration of SARS-CoV-2 viral copies this assay can detect is 131 copies/mL. A negative result does not preclude SARS-Cov-2 infection and should not be used as the sole basis for treatment or other patient management decisions. A negative result may occur with  improper specimen collection/handling, submission of specimen other than nasopharyngeal swab, presence of viral mutation(s) within the areas targeted by this assay, and inadequate number of viral copies (<131 copies/mL). A negative result must be combined with clinical observations,  patient history, and epidemiological information. The expected result is Negative.  Fact Sheet for Patients:  https://www.moore.com/  Fact Sheet for Healthcare Providers:  https://www.young.biz/  This test is no t yet approved or cleared by the [place] FDA and  has been authorized for detection and/or diagnosis of SARS-CoV-2 by FDA under an Emergency Use Authorization (EUA). This EUA will remain  in effect (meaning this test can be used) for the duration of the COVID-19 declaration under Section 564(b)(1) of the Act, 21 U.S.C. section 360bbb-3(b)(1), unless the authorization is terminated or revoked sooner.     Influenza A by PCR NEGATIVE NEGATIVE Final   Influenza B by PCR NEGATIVE NEGATIVE Final    Comment: (NOTE) The Xpert Xpress SARS-CoV-2/FLU/RSV assay is intended as an aid in  the diagnosis of influenza from Nasopharyngeal swab specimens and  should not be used as a sole basis for treatment. Nasal washings and  aspirates are unacceptable for Xpert Xpress SARS-CoV-2/FLU/RSV  testing.  Fact Sheet for Patients: https://www.moore.com/  Fact Sheet for Healthcare Providers: https://www.young.biz/  This test is not yet approved or cleared by the [place] FDA and  has been authorized for detection and/or diagnosis of SARS-CoV-2 by  FDA under an Emergency Use Authorization (EUA). This EUA will remain  in effect (meaning this test can be used) for the duration of the  Covid-19 declaration under Section 564(b)(1) of the Act, 21  U.S.C. section 360bbb-3(b)(1), unless the authorization is  terminated or revoked. Performed at [facility], [address], [place], [place] [address]      Radiology Studies: No results found.  Scheduled Meds: . Chlorhexidine Gluconate Cloth  6 each Topical Daily  . furosemide  20 mg Oral Daily  . [START ON 04/18/2020] heparin  5,000 Units  Subcutaneous Q8H  . pantoprazole  40 mg Oral Daily  . spironolactone  25 mg Oral Daily   Continuous Infusions: . sodium chloride 75 mL/hr at 04/17/20 1310     LOS: 4 days   Rickey Barbara, MD [facility] Pager On Amion  If 7PM-7AM, please contact night-coverage 04/17/2020, 4:47 PM

## 2020-04-17 NOTE — Progress Notes (Signed)
Progress Note   Subjective  Chief Complaint: Ascites, history of chronic hepatitis B  This morning patient found sleeping time my exam, she awakens but does not offer much in the way history.  Tells me that she has a small amount of generalized abdominal pain but does not feel like her abdomen is as distended as it was before.  Per hospitalist patient has been retaining urine, she currently has a Foley in place, they are unsure why, there are plans for her to undergo an outpatient cystoscopy.   Objective   Vital signs in last 24 hours: Temp:  [98.4 F (36.9 C)-98.9 F (37.2 C)] 98.4 F (36.9 C) (11/08 0513) Pulse Rate:  [67-72] 67 (11/08 0513) Resp:  [16-20] 18 (11/08 0513) BP: (108-120)/(70-85) 108/70 (11/08 0513) SpO2:  [98 %-99 %] 98 % (11/08 0513) Weight:  [63.1 kg] 63.1 kg (11/08 0500) Last BM Date: 04/16/20 General:    Asian female in NAD Heart:  Regular rate and rhythm; no murmurs Lungs: Respirations even and unlabored, lungs CTA bilaterally Abdomen:  Soft, nontender and nondistended. Normal bowel sounds. Psych:  Cooperative. Normal mood and affect.  Intake/Output from previous day: 11/07 0701 - 11/08 0700 In: 960 [P.O.:960] Out: 2750 [Urine:2750] Intake/Output this shift: Total I/O In: 118 [P.O.:118] Out: 475 [Urine:475]  Lab Results: Recent Labs    04/15/20 0520  WBC 6.3  HGB 12.9  HCT 38.1  PLT 231   BMET Recent Labs    04/15/20 0520 04/16/20 0437 04/17/20 0354  NA 136 140 136  K 4.2 4.0 3.7  CL 106 110 106  CO2 19* 22 23  GLUCOSE 93 85 88  BUN 28* 11 9  CREATININE 1.81* 0.61 0.64  CALCIUM 8.7* 8.0* 7.9*   LFT Recent Labs    04/17/20 0354  PROT 5.7*  ALBUMIN 2.9*  AST 17  ALT 18  ALKPHOS 39  BILITOT 0.7   Studies/Results: US RENAL  Result Date: 04/15/2020 CLINICAL DATA:  Acute kidney injury EXAM: RENAL / URINARY TRACT ULTRASOUND COMPLETE COMPARISON:  CT abdomen pelvis 04/13/2020 FINDINGS: Right Kidney: Renal measurements: 9.5 x  5.7 x 5.4 cm = volume: 153 mL. Echogenicity within normal limits. No mass or hydronephrosis visualized. Left Kidney: Renal measurements: 9.7 by 6.0 x 6.0 cm = volume: 186 mL. Echogenicity within normal limits. No mass or hydronephrosis visualized. Bladder: Not visualized. Other: Ascites. IMPRESSION: 1.  Unremarkable sonographic appearance of the bilateral kidneys. 2.  Bladder could not be definitely visualized. Electronically Signed   By: Emmaline Kluver M.D.   On: 04/15/2020 15:54   DG CHEST PORT 1 VIEW  Result Date: 04/15/2020 CLINICAL DATA:  Pleural effusions, recurrent ascites, chronic hepatitis-B EXAM: PORTABLE CHEST 1 VIEW COMPARISON:  04/13/2020 chest radiograph. FINDINGS: Stable cardiomediastinal silhouette with normal heart size. No pneumothorax. Slight blunting of the costophrenic angles bilaterally, cannot exclude trace pleural effusions. No pulmonary edema. Minimal left basilar atelectasis. IMPRESSION: Slight blunting of the costophrenic angles bilaterally, cannot exclude trace pleural effusions. Minimal left basilar atelectasis. Electronically Signed   By: Delbert Phenix M.D.   On: 04/15/2020 17:19   US LIVER DOPPLER  Result Date: 04/16/2020 CLINICAL DATA:  Ascites, abnormal LFTs, hepatic steatosis EXAM: DUPLEX ULTRASOUND OF LIVER TECHNIQUE: Color and duplex Doppler ultrasound was performed to evaluate the hepatic in-flow and out-flow vessels. COMPARISON:  06/23/2018 CT with contrast FINDINGS: Liver: Slight increased echogenicity. Normal hepatic contour without nodularity. No focal lesion, mass or intrahepatic biliary ductal dilatation. Main Portal Vein size: 1.0 cm  Portal Vein Velocities Main Prox:  15 cm/sec Main Mid: 18 cm/sec Main Dist:  13 cm/sec Right: 8.0 cm/sec Left: 8.0 cm/sec Hepatic Vein Velocities Right:  18 cm/sec Middle:  14 cm/sec Left:  6 cm/sec IVC: Present and patent with normal respiratory phasicity. Hepatic Artery Velocity:  36 cm/sec Splenic Vein Velocity:  15 cm/sec Spleen:  7.6 cm x 8.1 cm x 7.1 cm with a total volume of 229 cm^3 (411 cm^3 is upper limit normal) Portal Vein Occlusion/Thrombus: No Splenic Vein Occlusion/Thrombus: No Ascites: Small amount of upper abdominal ascites Varices: Not visualized Patent portal, hepatic and splenic veins with normal directional flow. Negative for portal vein occlusion or thrombus. Along the gallbladder neck anterior wall, there is a echogenic slightly lobulated intraluminal abnormality or mass measuring 1.4 x 1.1 x 1.0 cm without shadowing. This remains nonspecific and may represent adherent gallbladder sludge. Consider follow-up short-term right upper quadrant ultrasound. IMPRESSION: Normal hepatic venous Doppler. Suspect mild hepatic steatosis Perihepatic small volume of ascites 1.4 cm gallbladder neck anterior wall echogenic nodular abnormality, indeterminate but may represent adherent gallbladder sludge. Consider short-term follow-up right upper quadrant ultrasound at 3 months and if persistent consider further imaging with MRI. Electronically Signed   By: Judie Petit.  Shick M.D.   On: 04/16/2020 12:56    Assessment / Plan:    Assessment: 1.  Recurrent ascites without cirrhosis on CT scan: SAAG consistent with portal hypertension, INR normal, diagnostic paracentesis this admission negative for SBP, ultrasound liver Doppler normal, perihepatic small volume of ascites, 1.4 cm gallbladder neck anterior wall echogenic nodular abnormality, indeterminate but may represent adherent gallbladder sludge, recommend short-term follow-up right upper quadrant ultrasound in 3 months 2.  Chronic hepatitis C: Stopped antiviral treatment several months ago, exercising Take medications, sees [facility] outpatient 3.  AKI: Creatinine went up to 4.22, appreciate nephrology assistance, creatinine now normal, AKI felt secondary to volume depletion plus NSAIDs  Plan: 1.  Patient will need follow-up right upper quadrant ultrasound in 3 months 2.  Continue  Protonix 40 mg p.o. daily 3.  Patient should be on a low-salt diet 4.  Started patient on 20 mg of Lasix and 25 mg of Aldactone daily.  This may need to be titrated up eventually. 5.  There is some question about resuming Tenofovir year or switching to Entecavir 6.  Please await further recommendations from Dr. Orvan Falconer later today  Thank you for your kind consultation, we will continue to follow.   LOS: 4 days   Unk Lightning  04/17/2020, 10:38 AM

## 2020-04-18 ENCOUNTER — Inpatient Hospital Stay (HOSPITAL_COMMUNITY): Payer: BC Managed Care – PPO

## 2020-04-18 HISTORY — PX: IR VENOGRAM HEPATIC W HEMODYNAMIC EVALUATION: IMG692

## 2020-04-18 HISTORY — PX: IR US GUIDE VASC ACCESS RIGHT: IMG2390

## 2020-04-18 HISTORY — PX: IR TRANSCATHETER BX: IMG713

## 2020-04-18 LAB — COMPREHENSIVE METABOLIC PANEL
ALT: 17 U/L (ref 0–44)
AST: 19 U/L (ref 15–41)
Albumin: 3 g/dL — ABNORMAL LOW (ref 3.5–5.0)
Alkaline Phosphatase: 37 U/L — ABNORMAL LOW (ref 38–126)
Anion gap: 6 (ref 5–15)
BUN: 7 mg/dL (ref 6–20)
CO2: 27 mmol/L (ref 22–32)
Calcium: 8 mg/dL — ABNORMAL LOW (ref 8.9–10.3)
Chloride: 105 mmol/L (ref 98–111)
Creatinine, Ser: 0.8 mg/dL (ref 0.44–1.00)
GFR, Estimated: >60 mL/min (ref >60)
Glucose, Bld: 95 mg/dL (ref 70–99)
Potassium: 3.2 mmol/L — ABNORMAL LOW (ref 3.5–5.1)
Sodium: 138 mmol/L (ref 135–145)
Total Bilirubin: 0.8 mg/dL (ref 0.3–1.2)
Total Protein: 5.7 g/dL — ABNORMAL LOW (ref 6.5–8.1)

## 2020-04-18 LAB — CBC WITH DIFFERENTIAL/PLATELET
Abs Immature Granulocytes: 0.01 10*3/uL (ref 0.00–0.07)
Basophils Absolute: 0 10*3/uL (ref 0.0–0.1)
Basophils Relative: 0 %
Eosinophils Absolute: 0.1 10*3/uL (ref 0.0–0.5)
Eosinophils Relative: 2 %
HCT: 32.1 % — ABNORMAL LOW (ref 36.0–46.0)
Hemoglobin: 10.9 g/dL — ABNORMAL LOW (ref 12.0–15.0)
Immature Granulocytes: 0 %
Lymphocytes Relative: 46 %
Lymphs Abs: 2.8 10*3/uL (ref 0.7–4.0)
MCH: 30.6 pg (ref 26.0–34.0)
MCHC: 34 g/dL (ref 30.0–36.0)
MCV: 90.2 fL (ref 80.0–100.0)
Monocytes Absolute: 0.6 10*3/uL (ref 0.1–1.0)
Monocytes Relative: 10 %
Neutro Abs: 2.6 10*3/uL (ref 1.7–7.7)
Neutrophils Relative %: 42 %
Platelets: 193 10*3/uL (ref 150–400)
RBC: 3.56 MIL/uL — ABNORMAL LOW (ref 3.87–5.11)
RDW: 11.7 % (ref 11.5–15.5)
WBC: 6.1 10*3/uL (ref 4.0–10.5)
nRBC: 0 % (ref 0.0–0.2)

## 2020-04-18 LAB — HEPATITIS B SURFACE ANTIGEN: Hepatitis B Surface Ag: REACTIVE — AB

## 2020-04-18 LAB — HEPATITIS B E ANTIGEN: Hep B E Ag: POSITIVE — AB

## 2020-04-18 MED ORDER — MIDAZOLAM HCL 2 MG/2ML IJ SOLN
INTRAMUSCULAR | Status: AC
  Filled 2020-04-18: qty 4

## 2020-04-18 MED ORDER — FENTANYL CITRATE (PF) 100 MCG/2ML IJ SOLN
INTRAMUSCULAR | Status: AC | PRN
Start: 2020-04-18 — End: 2020-04-18
  Administered 2020-04-18 (×4): 50 ug via INTRAVENOUS

## 2020-04-18 MED ORDER — SENNA 8.6 MG PO TABS
2.0000 | ORAL_TABLET | Freq: Every day | ORAL | Status: DC
  Administered 2020-04-18: 17.2 mg via ORAL
  Filled 2020-04-18 (×2): qty 2

## 2020-04-18 MED ORDER — MAGNESIUM CITRATE PO SOLN
1.0000 | Freq: Once | ORAL | Status: AC
  Administered 2020-04-18: 1 via ORAL
  Filled 2020-04-18: qty 296

## 2020-04-18 MED ORDER — LIDOCAINE HCL (PF) 1 % IJ SOLN
INTRAMUSCULAR | Status: AC | PRN
  Administered 2020-04-18: 5 mL

## 2020-04-18 MED ORDER — SENNA 8.6 MG PO TABS
1.0000 | ORAL_TABLET | Freq: Every day | ORAL | Status: DC | PRN
  Administered 2020-04-18: 8.6 mg via ORAL
  Filled 2020-04-18: qty 1

## 2020-04-18 MED ORDER — MIDAZOLAM HCL 2 MG/2ML IJ SOLN
INTRAMUSCULAR | Status: AC | PRN
  Administered 2020-04-18: 1 mg via INTRAVENOUS
  Administered 2020-04-18: 2 mg via INTRAVENOUS
  Administered 2020-04-18: 1 mg via INTRAVENOUS

## 2020-04-18 MED ORDER — FENTANYL CITRATE (PF) 100 MCG/2ML IJ SOLN
INTRAMUSCULAR | Status: AC
  Filled 2020-04-18: qty 2

## 2020-04-18 MED ORDER — POTASSIUM CHLORIDE CRYS ER 20 MEQ PO TBCR
40.0000 meq | EXTENDED_RELEASE_TABLET | Freq: Two times a day (BID) | ORAL | Status: AC
  Administered 2020-04-18 (×2): 40 meq via ORAL
  Filled 2020-04-18 (×2): qty 2

## 2020-04-18 MED ORDER — POLYETHYLENE GLYCOL 3350 17 G PO PACK
17.0000 g | PACK | Freq: Every day | ORAL | Status: DC
  Administered 2020-04-18 – 2020-04-19 (×2): 17 g via ORAL
  Filled 2020-04-18 (×2): qty 1

## 2020-04-18 MED ORDER — LIDOCAINE HCL 1 % IJ SOLN
INTRAMUSCULAR | Status: AC
  Filled 2020-04-18: qty 20

## 2020-04-18 NOTE — Procedures (Signed)
  Procedure: R hepatic venogram with wedge pressures, core liver transvenous biopsy   EBL:   minimal Complications:  none immediate  See full dictation in [employer].  Thora Lance MD Main # [phone] Pager  [phone] Mobile [phone]

## 2020-04-18 NOTE — Progress Notes (Signed)
PROGRESS NOTE    Kim Pacheco  LFY:[id number] DOB: 1985-12-23 DOA: 04/13/2020 PCP: Wynn Banker, MD    Brief Narrative:  34yo with hx of hx hep B, depression, ETOH abuse (last intake was one week prior to visit), constipation who presented with increasing abd girth, found to have symptomatic ascites and worsening renal failure  Assessment & Plan:   Principal Problem:   ARF (acute renal failure) (HCC) Active Problems:   Hepatitis B   Ascites   Abdominal pain   1. Acute renal failure/hyponatremia:  -Creatinine was 0.84 on November 1, rising to 3.77 on day of admission, peaking to over 4 the following day -Nephrology was consulted -Concern for possible dehydration with underlying ATN from NSAIDS with portal HTN as well as bladder outlet obstruction -Renal function has normalized with foley cath and IVF hydration -Nephrology has since signed off -Recommend continued foley cath through discharge and close follow up with Urology  2. Ascites:  -Uncertain etiology -SAAG noted to be >1.1, suggesting underlying portal hypertension in the setting of ETOH use and hep B -Appreciate input by GI.  -2d echo ordered and performed, reviewed. Normal LVEF. Note of large pleural effusion mentioned on report, see below -AFP levels unremarkable  -Liver doppler reviewed, normal hepatic venous doppler, suspect mild hepatic steatosis with small volume perihepatic ascites. Incidental 1.4cm gallbladder neck abnormality may represent adherent sludge. Rec for f/u RUQ [place] at 3 months and if persistent, further imaging with MRI -Pt now s/p liver biopsy today, f/u on results -GI continues to follow -Will repeat LFT's in AM  3. History of alcohol abuse:  -Pt had noted drinking 3 to 4 glasses of wine daily, last intake was one week prior -Cont to monitor for withdrawal symptoms, although pt is outside withdrawal window at this time -No withdrawal symptoms this visit  4.  History of  hepatitis B:  -Reportedly had [facility].   -Pt reports not have taken antiviral previously.   5. Possible pleural effusion -Noted on 2d echo -Recent CXR on 11/4, reviewed, was clear -Lungs had remained clear B at the bases with good air movement -Repeat CXR reviewed, only trace effusion can be appreciated -Remains on room air  DVT prophylaxis: Heparin subq Code Status: Full Family Communication: Pt in room, family not at bedside  Status is: Inpatient  Remains inpatient appropriate because:Unsafe d/c plan, IV treatments appropriate due to intensity of illness or inability to take PO and Inpatient level of care appropriate due to severity of illness   Dispo: The patient is from: Home              Anticipated d/c is to: Home              Anticipated d/c date is: 1 day              Patient currently is not medically stable to d/c.   Consultants:   GI  Nephrology  Procedures:     Antimicrobials: Anti-infectives (From admission, onward)   None      Subjective: Without chest pain or sob. Is feeling somewhat apprehensive about needing foley on d/c  Objective: Vitals:   04/18/20 0930 04/18/20 0935 04/18/20 0952 04/18/20 1421  BP: 124/72 120/86 114/78 127/77  Pulse: 65 63 (!) 56 (!) 59  Resp: 13 13 14  (!) 22  Temp:   97.7 F (36.5 C) 98 F (36.7 C)  TempSrc:   Axillary Oral  SpO2: 98% 90% 92%  94%  Weight:        Intake/Output Summary (Last 24 hours) at 04/18/2020 1458 Last data filed at 04/18/2020 1143 Gross per 24 hour  Intake 436 ml  Output 7850 ml  Net -7414 ml   Filed Weights   04/16/20 0439 04/17/20 0500 04/18/20 0500  Weight: 62.5 kg 63.1 kg 63.2 kg    Examination: General exam: Awake, laying in bed, in nad Respiratory system: Normal respiratory effort, no wheezing Cardiovascular system: regular rate, s1, s2 Gastrointestinal system: Soft, nondistended, positive BS Central nervous system: CN2-12 grossly intact, strength  intact Extremities: Perfused, no clubbing Skin: Normal skin turgor, no notable skin lesions seen Psychiatry: Mood normal // no visual hallucinations   Data Reviewed: I have personally reviewed following labs and imaging studies  CBC: Recent Labs  Lab 04/13/20 1830 04/14/20 0419 04/15/20 0520 04/18/20 0421  WBC 8.0 8.6 6.3 6.1  NEUTROABS 4.9  --   --  2.6  HGB 15.2* 14.8 12.9 10.9*  HCT 43.8 43.5 38.1 32.1*  MCV 89.9 90.1 90.9 90.2  PLT 270 270 231 193   Basic Metabolic Panel: Recent Labs  Lab 04/14/20 0419 04/15/20 0520 04/16/20 0437 04/17/20 0354 04/18/20 0421  NA 129* 136 140 136 138  K 4.7 4.2 4.0 3.7 3.2*  CL 97* 106 110 106 105  CO2 18* 19* 22 23 27   GLUCOSE 99 93 85 88 95  BUN 48* 28* 11 9 7   CREATININE 4.22* 1.81* 0.61 0.64 0.80  CALCIUM 8.7* 8.7* 8.0* 7.9* 8.0*  MG  --  2.1  --   --   --    GFR: Estimated Creatinine Clearance: 80.7 mL/min (by C-G formula based on SCr of 0.8 mg/dL). Liver Function Tests: Recent Labs  Lab 04/14/20 0419 04/15/20 0520 04/16/20 0437 04/17/20 0354 04/18/20 0421  AST 23 16 17 17 19   ALT 30 23 19 18 17   ALKPHOS 59 46 39 39 37*  BILITOT 1.3* 1.3* 0.8 0.7 0.8  PROT 8.9* 7.4 5.7* 5.7* 5.7*  ALBUMIN 4.6 3.7 2.9* 2.9* 3.0*   Recent Labs  Lab 04/13/20 1830  LIPASE 28   No results for input(s): AMMONIA in the last 168 hours. Coagulation Profile: Recent Labs  Lab 04/14/20 0419  INR 1.0   Cardiac Enzymes: No results for input(s): CKTOTAL, CKMB, CKMBINDEX, TROPONINI in the last 168 hours. BNP (last 3 results) No results for input(s): PROBNP in the last 8760 hours. HbA1C: No results for input(s): HGBA1C in the last 72 hours. CBG: No results for input(s): GLUCAP in the last 168 hours. Lipid Profile: No results for input(s): CHOL, HDL, LDLCALC, TRIG, CHOLHDL, LDLDIRECT in the last 72 hours. Thyroid Function Tests: No results for input(s): TSH, T4TOTAL, FREET4, T3FREE, THYROIDAB in the last 72 hours. Anemia Panel: No  results for input(s): VITAMINB12, FOLATE, FERRITIN, TIBC, IRON, RETICCTPCT in the last 72 hours. Sepsis Labs: No results for input(s): PROCALCITON, LATICACIDVEN in the last 168 hours.  Recent Results (from the past 240 hour(s))  Body fluid culture     Status: None   Collection Time: 04/13/20  9:10 PM   Specimen: Peritoneal Cavity; Peritoneal Fluid  Result Value Ref Range Status   Specimen Description   Final    PERITONEAL CAVITY Performed at [facility], [address] [place], [place]    Special Requests   Final    NONE Performed at [facility], [address] [place], [place]    Gram Stain  Final    WBC PRESENT, PREDOMINANTLY PMN NO ORGANISMS SEEN CYTOSPIN SMEAR    Culture   Final    NO GROWTH 3 DAYS Performed at [facility], [address] [place], [place] [address]    Report Status 04/17/2020 FINAL  Final  Respiratory Panel by RT PCR (Flu A&B, Covid) - Nasopharyngeal Swab     Status: None   Collection Time: 04/13/20 10:24 PM   Specimen: Nasopharyngeal Swab  Result Value Ref Range Status   SARS Coronavirus 2 by RT PCR NEGATIVE NEGATIVE Final    Comment: (NOTE) SARS-CoV-2 target nucleic acids are NOT DETECTED.  The SARS-CoV-2 RNA is generally detectable in upper respiratoy specimens during the acute phase of infection. The lowest concentration of SARS-CoV-2 viral copies this assay can detect is 131 copies/mL. A negative result does not preclude SARS-Cov-2 infection and should not be used as the sole basis for treatment or other patient management decisions. A negative result may occur with  improper specimen collection/handling, submission of specimen other than nasopharyngeal swab, presence of viral mutation(s) within the areas targeted by this assay, and inadequate number of viral copies (<131 copies/mL). A negative result must be combined with clinical observations, patient history, and  epidemiological information. The expected result is Negative.  Fact Sheet for Patients:  https://www.moore.com/  Fact Sheet for Healthcare Providers:  https://www.young.biz/  This test is no t yet approved or cleared by the [place] FDA and  has been authorized for detection and/or diagnosis of SARS-CoV-2 by FDA under an Emergency Use Authorization (EUA). This EUA will remain  in effect (meaning this test can be used) for the duration of the COVID-19 declaration under Section 564(b)(1) of the Act, 21 U.S.C. section 360bbb-3(b)(1), unless the authorization is terminated or revoked sooner.     Influenza A by PCR NEGATIVE NEGATIVE Final   Influenza B by PCR NEGATIVE NEGATIVE Final    Comment: (NOTE) The Xpert Xpress SARS-CoV-2/FLU/RSV assay is intended as an aid in  the diagnosis of influenza from Nasopharyngeal swab specimens and  should not be used as a sole basis for treatment. Nasal washings and  aspirates are unacceptable for Xpert Xpress SARS-CoV-2/FLU/RSV  testing.  Fact Sheet for Patients: https://www.moore.com/  Fact Sheet for Healthcare Providers: https://www.young.biz/  This test is not yet approved or cleared by the [place] FDA and  has been authorized for detection and/or diagnosis of SARS-CoV-2 by  FDA under an Emergency Use Authorization (EUA). This EUA will remain  in effect (meaning this test can be used) for the duration of the  Covid-19 declaration under Section 564(b)(1) of the Act, 21  U.S.C. section 360bbb-3(b)(1), unless the authorization is  terminated or revoked. Performed at [facility], [address] [place], [place] [address]      Radiology Studies: No results found.  Scheduled Meds: . Chlorhexidine Gluconate Cloth  6 each Topical Daily  . fentaNYL      . fentaNYL      . furosemide  20 mg Oral Daily  . heparin  5,000 Units  Subcutaneous Q8H  . lidocaine      . midazolam      . pantoprazole  40 mg Oral Daily  . polyethylene glycol  17 g Oral Daily  . potassium chloride  40 mEq Oral BID  . spironolactone  25 mg Oral Daily   Continuous Infusions:    LOS: 5 days   Rickey Barbara, MD [facility] Pager On Amion  If 7PM-7AM,  please contact night-coverage 04/18/2020, 2:58 PM

## 2020-04-18 NOTE — Progress Notes (Signed)
[facility]  Went by to see this patient on a few different occasions today but she has been in IR for her liver biopsy, we will follow with her tomorrow.  Hyacinth Meeker, PA-C.

## 2020-04-19 ENCOUNTER — Other Ambulatory Visit: Payer: Self-pay

## 2020-04-19 DIAGNOSIS — K766 Portal hypertension: Secondary | ICD-10-CM | POA: Diagnosis not present

## 2020-04-19 DIAGNOSIS — B160 Acute hepatitis B with delta-agent with hepatic coma: Secondary | ICD-10-CM

## 2020-04-19 DIAGNOSIS — R188 Other ascites: Secondary | ICD-10-CM | POA: Diagnosis not present

## 2020-04-19 DIAGNOSIS — B181 Chronic viral hepatitis B without delta-agent: Secondary | ICD-10-CM | POA: Diagnosis not present

## 2020-04-19 LAB — COMPREHENSIVE METABOLIC PANEL
ALT: 120 U/L — ABNORMAL HIGH (ref 0–44)
AST: 142 U/L — ABNORMAL HIGH (ref 15–41)
Albumin: 3.4 g/dL — ABNORMAL LOW (ref 3.5–5.0)
Alkaline Phosphatase: 43 U/L (ref 38–126)
Anion gap: 6 (ref 5–15)
BUN: 5 mg/dL — ABNORMAL LOW (ref 6–20)
CO2: 25 mmol/L (ref 22–32)
Calcium: 8.6 mg/dL — ABNORMAL LOW (ref 8.9–10.3)
Chloride: 104 mmol/L (ref 98–111)
Creatinine, Ser: 0.69 mg/dL (ref 0.44–1.00)
GFR, Estimated: >60 mL/min (ref >60)
Glucose, Bld: 105 mg/dL — ABNORMAL HIGH (ref 70–99)
Potassium: 4.2 mmol/L (ref 3.5–5.1)
Sodium: 135 mmol/L (ref 135–145)
Total Bilirubin: 0.5 mg/dL (ref 0.3–1.2)
Total Protein: 7 g/dL (ref 6.5–8.1)

## 2020-04-19 LAB — SURGICAL PATHOLOGY

## 2020-04-19 MED ORDER — LIDOCAINE HCL URETHRAL/MUCOSAL 2 % EX GEL
1.0000 "application " | Freq: Once | CUTANEOUS | Status: DC
  Filled 2020-04-19: qty 5

## 2020-04-19 MED ORDER — LIDOCAINE HCL URETHRAL/MUCOSAL 2 % EX GEL
1.0000 "application " | Freq: Once | CUTANEOUS | Status: AC
  Administered 2020-04-20: 1 via URETHRAL
  Filled 2020-04-19: qty 11

## 2020-04-19 MED ORDER — LACTULOSE 10 GM/15ML PO SOLN
30.0000 g | Freq: Every day | ORAL | Status: AC
  Filled 2020-04-19: qty 45

## 2020-04-19 NOTE — Progress Notes (Signed)
PROGRESS NOTE  Kim Pacheco EXH:[id number] DOB: 09-14-1985 DOA: 04/13/2020 PCP: Wynn Banker, MD   LOS: 6 days   Brief Narrative / Interim history: 34yo with hx of hx hep B, depression, ETOH abuse (last intake was one week prior to visit), constipation who presented with increasing abd girth, found to have symptomatic ascites and worsening renal failure  Subjective / 24h Interval events: Doing a bit better, some abdominal fullness this morning  Assessment & Plan: Principal Problem Acute kidney injury, hyponatremia-previous renal function was normal, she was admitted with a creatinine 3.77 on the day of admission.  Nephrology was initially consulted, she ended up having bladder outlet obstruction requiring Foley catheter placement.  She failed spontaneous voiding trials x2.  Her renal function has now normalized.  Patient very insistent that she has another voiding trial as she really does not want to go home with a Foley catheter, will attempt that today -She has an appointment with urology as an outpatient tomorrow  Active Problems Ascites, hep B infection -Likely due to portal hypertension, liver does not look cirrhotic on imaging.  She is status post liver biopsy by IR yesterday, transjugular approach. -Appreciate gastroenterology follow-up, she has been placed on low-dose diuretics, continue. -There was an incidental adherent sludge in the gallbladder neck, 1.4 cm, she will need a repeat ultrasound in 3 months -She will need follow-up with her hepatologist at [facility] for further evaluation.  She has not seen her hepatologist in 1 & 1/2 years  History of alcohol abuse -Cessation recommended  Possible pleural effusion -Noted on 2d echo -Recent CXR on 11/4, reviewed, was clear -Lungs had remained clear B at the bases with good air movement -Repeat CXR reviewed, only trace effusion can be appreciated -Remains on room air  Scheduled Meds: . Chlorhexidine  Gluconate Cloth  6 each Topical Daily  . furosemide  20 mg Oral Daily  . heparin  5,000 Units Subcutaneous Q8H  . lactulose  30 g Oral Daily  . pantoprazole  40 mg Oral Daily  . polyethylene glycol  17 g Oral Daily  . senna  2 tablet Oral QHS  . spironolactone  25 mg Oral Daily   Continuous Infusions: PRN Meds:.acetaminophen **OR** acetaminophen, calcium carbonate, morphine injection, ondansetron **OR** ondansetron (ZOFRAN) IV, oxyCODONE, sodium chloride  Diet Orders (From admission, onward)    Start     Ordered   04/18/20 1120  Diet 2 gram sodium Room service appropriate? Yes; Fluid consistency: Thin  Diet effective now       Question Answer Comment  Room service appropriate? Yes   Fluid consistency: Thin      04/18/20 1119          DVT prophylaxis: heparin injection 5,000 Units Start: 04/18/20 2200     Code Status: Full Code  Family Communication: no family at bedside   Status is: Inpatient  Remains inpatient appropriate because:Inpatient level of care appropriate due to severity of illness   Dispo: The patient is from: Home              Anticipated d/c is to: Home              Anticipated d/c date is: 1 day              Patient currently is not medically stable to d/c.  Consultants:  Gastroenterology   Procedures:  None   Microbiology  None   Antimicrobials: None     Objective: Vitals:  04/18/20 1421 04/18/20 2001 04/19/20 0401 04/19/20 1100  BP: 127/77 126/80 105/66   Pulse: (!) 59 65 (!) 56   Resp: (!) 22 15 15    Temp: 98 F (36.7 C) 98.2 F (36.8 C) 98.1 F (36.7 C)   TempSrc: Oral Oral Oral   SpO2: 94% 98% 97%   Weight:   66.1 kg   Height:    5' (1.524 m)    Intake/Output Summary (Last 24 hours) at 04/19/2020 1506 Last data filed at 04/19/2020 1353 Gross per 24 hour  Intake 554 ml  Output 2525 ml  Net -1971 ml   Filed Weights   04/17/20 0500 04/18/20 0500 04/19/20 0401  Weight: 63.1 kg 63.2 kg 66.1 kg     Examination:  Constitutional: NAD Eyes: no scleral icterus ENMT: Mucous membranes are moist.  Neck: normal, supple Respiratory: clear to auscultation bilaterally, no wheezing, no crackles. Normal respiratory effort. No accessory muscle use.  Cardiovascular: Regular rate and rhythm, no murmurs / rubs / gallops.  Abdomen: non distended, no tenderness. Bowel sounds positive.  Musculoskeletal: no clubbing / cyanosis.  Skin: no rashes Neurologic: non focal    Data Reviewed: I have independently reviewed following labs and imaging studies   CBC: Recent Labs  Lab 04/13/20 1830 04/14/20 0419 04/15/20 0520 04/18/20 0421  WBC 8.0 8.6 6.3 6.1  NEUTROABS 4.9  --   --  2.6  HGB 15.2* 14.8 12.9 10.9*  HCT 43.8 43.5 38.1 32.1*  MCV 89.9 90.1 90.9 90.2  PLT 270 270 231 193   Basic Metabolic Panel: Recent Labs  Lab 04/15/20 0520 04/16/20 0437 04/17/20 0354 04/18/20 0421 04/19/20 0408  NA 136 140 136 138 135  K 4.2 4.0 3.7 3.2* 4.2  CL 106 110 106 105 104  CO2 19* 22 23 27 25   GLUCOSE 93 85 88 95 105*  BUN 28* 11 9 7  5*  CREATININE 1.81* 0.61 0.64 0.80 0.69  CALCIUM 8.7* 8.0* 7.9* 8.0* 8.6*  MG 2.1  --   --   --   --    Liver Function Tests: Recent Labs  Lab 04/15/20 0520 04/16/20 0437 04/17/20 0354 04/18/20 0421 04/19/20 0408  AST 16 17 17 19  142*  ALT 23 19 18 17  120*  ALKPHOS 46 39 39 37* 43  BILITOT 1.3* 0.8 0.7 0.8 0.5  PROT 7.4 5.7* 5.7* 5.7* 7.0  ALBUMIN 3.7 2.9* 2.9* 3.0* 3.4*   Coagulation Profile: Recent Labs  Lab 04/14/20 0419  INR 1.0   HbA1C: No results for input(s): HGBA1C in the last 72 hours. CBG: No results for input(s): GLUCAP in the last 168 hours.  Recent Results (from the past 240 hour(s))  Body fluid culture     Status: None   Collection Time: 04/13/20  9:10 PM   Specimen: Peritoneal Cavity; Peritoneal Fluid  Result Value Ref Range Status   Specimen Description   Final    PERITONEAL CAVITY Performed at [facility], [address] [place], [place]    Special Requests   Final    NONE Performed at [facility], [address], [place], [address] [place]    Gram Stain   Final    WBC PRESENT, PREDOMINANTLY PMN NO ORGANISMS SEEN CYTOSPIN SMEAR    Culture   Final    NO GROWTH 3 DAYS Performed at [facility], [address], [place], [place] [address]    Report Status 04/17/2020 FINAL  Final  Respiratory  Panel by RT PCR (Flu A&B, Covid) - Nasopharyngeal Swab     Status: None   Collection Time: 04/13/20 10:24 PM   Specimen: Nasopharyngeal Swab  Result Value Ref Range Status   SARS Coronavirus 2 by RT PCR NEGATIVE NEGATIVE Final    Comment: (NOTE) SARS-CoV-2 target nucleic acids are NOT DETECTED.  The SARS-CoV-2 RNA is generally detectable in upper respiratoy specimens during the acute phase of infection. The lowest concentration of SARS-CoV-2 viral copies this assay can detect is 131 copies/mL. A negative result does not preclude SARS-Cov-2 infection and should not be used as the sole basis for treatment or other patient management decisions. A negative result may occur with  improper specimen collection/handling, submission of specimen other than nasopharyngeal swab, presence of viral mutation(s) within the areas targeted by this assay, and inadequate number of viral copies (<131 copies/mL). A negative result must be combined with clinical observations, patient history, and epidemiological information. The expected result is Negative.  Fact Sheet for Patients:  https://www.moore.com/  Fact Sheet for Healthcare Providers:  https://www.young.biz/  This test is no t yet approved or cleared by the [place] FDA and  has been authorized for detection and/or diagnosis of SARS-CoV-2 by FDA under an Emergency Use Authorization (EUA). This EUA will remain  in effect (meaning this test can be used) for the  duration of the COVID-19 declaration under Section 564(b)(1) of the Act, 21 U.S.C. section 360bbb-3(b)(1), unless the authorization is terminated or revoked sooner.     Influenza A by PCR NEGATIVE NEGATIVE Final   Influenza B by PCR NEGATIVE NEGATIVE Final    Comment: (NOTE) The Xpert Xpress SARS-CoV-2/FLU/RSV assay is intended as an aid in  the diagnosis of influenza from Nasopharyngeal swab specimens and  should not be used as a sole basis for treatment. Nasal washings and  aspirates are unacceptable for Xpert Xpress SARS-CoV-2/FLU/RSV  testing.  Fact Sheet for Patients: https://www.moore.com/  Fact Sheet for Healthcare Providers: https://www.young.biz/  This test is not yet approved or cleared by the [place] FDA and  has been authorized for detection and/or diagnosis of SARS-CoV-2 by  FDA under an Emergency Use Authorization (EUA). This EUA will remain  in effect (meaning this test can be used) for the duration of the  Covid-19 declaration under Section 564(b)(1) of the Act, 21  U.S.C. section 360bbb-3(b)(1), unless the authorization is  terminated or revoked. Performed at [facility], [address], [place], [place] [address]      Radiology Studies: No results found.   Pamella Pert, MD, PhD [facility]  Between 7 am - 7 pm I am available, please contact me via Amion or Securechat  Between 7 pm - 7 am I am not available, please contact night coverage MD/APP via Amion

## 2020-04-19 NOTE — Progress Notes (Signed)
Progress Note   Subjective  Chief Complaint: Ascites, history of chronic hepatitis B and alcohol use  Today, the patient tells me she continues with some abdominal discomfort, but she did take a laxative yesterday and had a good solid bowel movement, she then started with her period nd also tells me still has a Foley in place, "so there are a lot of reasons I could have pain".  Tells me her husband made her a urology appointment for tomorrow afternoon.   Objective   Vital signs in last 24 hours: Temp:  [98 F (36.7 C)-98.2 F (36.8 C)] 98.1 F (36.7 C) (11/10 0401) Pulse Rate:  [56-65] 56 (11/10 0401) Resp:  [15-22] 15 (11/10 0401) BP: (105-127)/(66-80) 105/66 (11/10 0401) SpO2:  [94 %-98 %] 97 % (11/10 0401) Weight:  [66.1 kg] 66.1 kg (11/10 0401) Last BM Date: 04/13/20 General:    Asian female in NAD Heart:  Regular rate and rhythm; no murmurs Lungs: Respirations even and unlabored, lungs CTA bilaterally Abdomen:  Soft, mild generalized TTP and nondistended. Normal bowel sounds. Extremities:  Without edema. Neurologic:  Alert and oriented,  grossly normal neurologically. Psych:  Cooperative. Normal mood and affect.  Intake/Output from previous day: 11/09 0701 - 11/10 0700 In: 200 [P.O.:200] Out: 3125 [Urine:3125]  Lab Results: Recent Labs    04/18/20 0421  WBC 6.1  HGB 10.9*  HCT 32.1*  PLT 193   BMET Recent Labs    04/17/20 0354 04/18/20 0421 04/19/20 0408  NA 136 138 135  K 3.7 3.2* 4.2  CL 106 105 104  CO2 23 27 25   GLUCOSE 88 95 105*  BUN 9 7 5*  CREATININE 0.64 0.80 0.69  CALCIUM 7.9* 8.0* 8.6*   Hepatic Function Latest Ref Rng & Units 04/19/2020 04/18/2020 04/17/2020  Total Protein 6.5 - 8.1 g/dL 7.0 5.7(L) 5.7(L)  Albumin 3.5 - 5.0 g/dL 3.4(L) 3.0(L) 2.9(L)  AST 15 - 41 U/L 142(H) 19 17  ALT 0 - 44 U/L 120(H) 17 18  Alk Phosphatase 38 - 126 U/L 43 37(L) 39  Total Bilirubin 0.3 - 1.2 mg/dL 0.5 0.8 0.7   Studies/Results: IR Venogram Hepatic  W Hemodynamic Evaluation  Result Date: 04/18/2020 CLINICAL DATA:  Hepatitis B, ascites.  Contrast allergy. EXAM: HEPATIC VENOGRAM HEPATIC AND  WEDGED PORTAL HEMODYNAMICS TRANSJUGULAR LIVER BIOPSY FLUOROSCOPY TIME:  7 minutes 42 seconds; 89 mGy PROCEDURE: The procedure, risks (including but not limited to bleeding, infection, organ damage ), benefits, and alternatives were explained to the patient. Questions regarding the procedure were encouraged and answered. The patient understands and consents to the procedure. Patency of the right IJ vein was confirmed with ultrasound with image documentation. An appropriate skin site was determined. Skin site was marked, prepped with chlorhexidine, and draped using maximum barrier technique. The region was infiltrated locally with 1% lidocaine. Intravenous Fentanyl 247mg and Versed 426mwere administered as conscious sedation during continuous monitoring of the patient's level of consciousness and physiological / cardiorespiratory status by the radiology RN, with a total moderate sedation time of 38 minutes. Under real-time ultrasound guidance, the right IJ vein was accessed with a 21 gauge micropuncture needle; the needle tip within the vein was confirmed with ultrasound image documentation. . Needle was exchanged over guidewire for transitional dilator, which allowed passage of a Bentson wire into the IVC. Over this, a 5 FrPakistanngiographic catheter was advanced and used to selectively catheterize the right hepatic vein. Confirmatory CO2 venography was performed. Hepatic venous pressures and  wedged portal venous pressures were obtained through the catheter. Both demonstrated significant respiratory phasicity. Maximum hepatic venous pressure 17 mmHg, maximum wedged portal venous pressure 30 mmHg. The catheter was exchanged over the guidewire for a vascular sheath through which the guide of the transjugular core biopsy needle was advanced. Through this, multiple 18-gauge core  liver biopsy samples were obtained. The sheath was removed and hemostasis achieved at the site. Patient tolerated procedure well, with no immediate complication. IMPRESSION: 1. Right hepatic venogram confirms appropriate catheter position. 2. Hepatic hemodynamics reveal 13 mmHg portosystemic venous pressure gradient (normal <=47mHg). 3. Technically successful transjugular core liver biopsy . Electronically Signed   By: DLucrezia EuropeM.D.   On: 04/18/2020 14:57   IR Transcatheter BX  Result Date: 04/18/2020 CLINICAL DATA:  Hepatitis B, ascites.  Contrast allergy. EXAM: HEPATIC VENOGRAM HEPATIC AND  WEDGED PORTAL HEMODYNAMICS TRANSJUGULAR LIVER BIOPSY FLUOROSCOPY TIME:  7 minutes 42 seconds; 89 mGy PROCEDURE: The procedure, risks (including but not limited to bleeding, infection, organ damage ), benefits, and alternatives were explained to the patient. Questions regarding the procedure were encouraged and answered. The patient understands and consents to the procedure. Patency of the right IJ vein was confirmed with ultrasound with image documentation. An appropriate skin site was determined. Skin site was marked, prepped with chlorhexidine, and draped using maximum barrier technique. The region was infiltrated locally with 1% lidocaine. Intravenous Fentanyl 2037m and Versed 40m78mere administered as conscious sedation during continuous monitoring of the patient's level of consciousness and physiological / cardiorespiratory status by the radiology RN, with a total moderate sedation time of 38 minutes. Under real-time ultrasound guidance, the right IJ vein was accessed with a 21 gauge micropuncture needle; the needle tip within the vein was confirmed with ultrasound image documentation. . Needle was exchanged over guidewire for transitional dilator, which allowed passage of a Bentson wire into the IVC. Over this, a 5 FrePakistangiographic catheter was advanced and used to selectively catheterize the right hepatic vein.  Confirmatory CO2 venography was performed. Hepatic venous pressures and wedged portal venous pressures were obtained through the catheter. Both demonstrated significant respiratory phasicity. Maximum hepatic venous pressure 17 mmHg, maximum wedged portal venous pressure 30 mmHg. The catheter was exchanged over the guidewire for a vascular sheath through which the guide of the transjugular core biopsy needle was advanced. Through this, multiple 18-gauge core liver biopsy samples were obtained. The sheath was removed and hemostasis achieved at the site. Patient tolerated procedure well, with no immediate complication. IMPRESSION: 1. Right hepatic venogram confirms appropriate catheter position. 2. Hepatic hemodynamics reveal 13 mmHg portosystemic venous pressure gradient (normal <=5mm13m. 3. Technically successful transjugular core liver biopsy . Electronically Signed   By: D  HLucrezia Europe.   On: 04/18/2020 14:57   IR US GKoreade Vasc Access Right  Result Date: 04/18/2020 CLINICAL DATA:  Hepatitis B, ascites.  Contrast allergy. EXAM: HEPATIC VENOGRAM HEPATIC AND  WEDGED PORTAL HEMODYNAMICS TRANSJUGULAR LIVER BIOPSY FLUOROSCOPY TIME:  7 minutes 42 seconds; 89 mGy PROCEDURE: The procedure, risks (including but not limited to bleeding, infection, organ damage ), benefits, and alternatives were explained to the patient. Questions regarding the procedure were encouraged and answered. The patient understands and consents to the procedure. Patency of the right IJ vein was confirmed with ultrasound with image documentation. An appropriate skin site was determined. Skin site was marked, prepped with chlorhexidine, and draped using maximum barrier technique. The region was infiltrated locally with 1% lidocaine. Intravenous Fentanyl 200mc28md Versed 40mg w10m  administered as conscious sedation during continuous monitoring of the patient's level of consciousness and physiological / cardiorespiratory status by the radiology RN,  with a total moderate sedation time of 38 minutes. Under real-time ultrasound guidance, the right IJ vein was accessed with a 21 gauge micropuncture needle; the needle tip within the vein was confirmed with ultrasound image documentation. . Needle was exchanged over guidewire for transitional dilator, which allowed passage of a Bentson wire into the IVC. Over this, a 5 [place] angiographic catheter was advanced and used to selectively catheterize the right hepatic vein. Confirmatory CO2 venography was performed. Hepatic venous pressures and wedged portal venous pressures were obtained through the catheter. Both demonstrated significant respiratory phasicity. Maximum hepatic venous pressure 17 mmHg, maximum wedged portal venous pressure 30 mmHg. The catheter was exchanged over the guidewire for a vascular sheath through which the guide of the transjugular core biopsy needle was advanced. Through this, multiple 18-gauge core liver biopsy samples were obtained. The sheath was removed and hemostasis achieved at the site. Patient tolerated procedure well, with no immediate complication. IMPRESSION: 1. Right hepatic venogram confirms appropriate catheter position. 2. Hepatic hemodynamics reveal 13 mmHg portosystemic venous pressure gradient (normal <=61mHg). 3. Technically successful transjugular core liver biopsy . Electronically Signed   By: DLucrezia EuropeM.D.   On: 04/18/2020 14:57       Assessment / Plan:   Assessment: 1.  Recurrent ascites without cirrhosis on CT scan: Due to chronic hepatitis B, ongoing alcohol use, elastography in 2019 showed for 0-1 fibrosis, recent imaging does not support cirrhosis or portal hypertension, platelets 193, awaiting results from transjugular liver biopsy with wedge pressures to document portal hypertension 2.  Chronic hepatitis B: Stopped antiviral treatment, was followed [facility] 3.  AKI  Plan: 1.  Again patient will need follow-up right upper quadrant  ultrasound in 3 months 2.  Continue Protonix 40 mg p.o. daily 3.  Low-salt diet 4.  Will discuss diuretics with Dr. BTarri Glenn,  Likely this will need to be titrated outpatient. 5.  Awaiting results from liver biopsy yesterday 6.  Please await further recommendations from Dr. BTarri Glennlater today  Thank you for kind consultation, we will continue to follow.   LOS: 6 days   JLevin Erp 04/19/2020, 10:08 AM

## 2020-04-20 DIAGNOSIS — K7011 Alcoholic hepatitis with ascites: Secondary | ICD-10-CM

## 2020-04-20 DIAGNOSIS — R338 Other retention of urine: Secondary | ICD-10-CM | POA: Diagnosis not present

## 2020-04-20 MED ORDER — FUROSEMIDE 20 MG PO TABS
20.0000 mg | ORAL_TABLET | Freq: Every day | ORAL | 0 refills | Status: DC
Start: 2020-04-20 — End: 2020-09-05

## 2020-04-20 MED ORDER — OXYCODONE HCL 5 MG PO TABS: 5.0000 mg | ORAL_TABLET | ORAL | 0 refills | Status: DC | PRN

## 2020-04-20 MED ORDER — SPIRONOLACTONE 25 MG PO TABS
25.0000 mg | ORAL_TABLET | Freq: Every day | ORAL | 0 refills | Status: DC
Start: 2020-04-20 — End: 2020-09-05

## 2020-04-20 NOTE — Progress Notes (Signed)
Discharge instructions given with stated understanding.  Patient provided with leg bag for foley catheter and instructed on how to change the leg bag from the standard bag.  Patient states understanding of this procedure and demonstrated such. Patient is currently waiting for transportation home

## 2020-04-20 NOTE — Progress Notes (Signed)
    Progress Note   Subjective  Chief Complaint: Ascites, history of chronic hepatitis C and alcohol use  This morning, the patient tells me that she is feeling some better since today or placed her catheter, apparently did a trial without the Foley and she is still unable to urinate. She has an appointment with urology as an outpatient at noon today and is hoping to make that. Denies any new complaints or concerns.    Objective   Vital signs in last 24 hours: Temp:  [97.9 F (36.6 C)-98.2 F (36.8 C)] 98.2 F (36.8 C) (11/11 0359) Pulse Rate:  [63-84] 63 (11/11 0359) Resp:  [12-15] 15 (11/11 0359) BP: (95-115)/(57-74) 95/57 (11/11 0359) SpO2:  [98 %-100 %] 98 % (11/11 0359) Weight:  [60.6 kg] 60.6 kg (11/11 0500) Last BM Date: 04/19/20 General:    Asian female in NAD Heart:  Regular rate and rhythm; no murmurs Lungs: Respirations even and unlabored, lungs CTA bilaterally Abdomen:  Soft, mild generalized TTP and nondistended. Normal bowel sounds. Extremities:  Without edema. Neurologic:  Alert and oriented,  grossly normal neurologically. Psych:  Cooperative. Normal mood and affect.  Intake/Output from previous day: 11/10 0701 - 11/11 0700 In: 714 [P.O.:714] Out: 50 [Urine:50] Intake/Output this shift: Total I/O In: -  Out: 500 [Urine:500]  Lab Results: Recent Labs    04/18/20 0421  WBC 6.1  HGB 10.9*  HCT 32.1*  PLT 193   BMET Recent Labs    04/18/20 0421 04/19/20 0408  NA 138 135  K 3.2* 4.2  CL 105 104  CO2 27 25  GLUCOSE 95 105*  BUN 7 5*  CREATININE 0.80 0.69  CALCIUM 8.0* 8.6*   LFT Recent Labs    04/19/20 0408  PROT 7.0  ALBUMIN 3.4*  AST 142*  ALT 120*  ALKPHOS 43  BILITOT 0.5    Assessment / Plan:    Assessment: 1. Recurrent ascites without cirrhosis on CT scan: Due to chronic hepatitis B, ongoing alcohol use, elastography in 2019 showed 0-1 fibrosis, recent imaging did not support cirrhosis, findings support portal hypertension as  a cause of ascites 2. Chronic hepatitis B: Stopped antiviral treatment, followed by [facility] in the past, recent fibrosis noted is mild on recent biopsies 3. AKI  Plan: 1. Discussed biopsy results with the patient today, there are no plans to restart her antiviral treatment as these showed only mild fibrosis. 2. Would recommend that she reestablish care with [facility] at time of discharge. 3. Continue Protonix 40 mg p.o. daily 4. Continue low-dose diuretics and 2 g sodium restricted diet 5. Needs repeat right upper quadrant ultrasound in 3 months 6. Abstinence from alcohol is critical for her long-term health, formal alcohol counseling recommended 7. Okay with patient discharge today.   Thank you for kind consultation, we will sign off.   LOS: 7 days   Unk Lightning  04/20/2020, 9:52 AM

## 2020-04-20 NOTE — Discharge Summary (Signed)
Physician Discharge Summary  Kim Pacheco ZOX:[id number] DOB: 1985-08-23 DOA: 04/13/2020  PCP: Wynn Banker, MD  Admit date: 04/13/2020 Discharge date: 04/20/2020  Admitted From: home Disposition:  home  Recommendations for Outpatient Follow-up:  1. Follow up with PCP in 1-2 weeks 2. Follow up with hepatology in 1 to 2 weeks 3. Follow-up with urology as scheduled today at noon  Home Health: None Equipment/Devices: None  Discharge Condition: Stable CODE STATUS: Full code Diet recommendation: Low-sodium diet  HPI: Per admitting MD, Kim Pacheco is a 34 y.o. female with a past medical history of hepatitis B, ascites of unclear etiology, history of depression, history of constipation, alcohol abuse, who had similar presentation back in late 2019 and 2020 with abdominal pain and ascites.  She was seen by gastroenterology.  She underwent EGD in February 2020 which showed gastritis.  Colonoscopy was done in July 2020 which was also reported as being normal.  It appears the patient subsequently underwent diagnostic laparoscopy in the [facility] system in March 2020.  She apparently was also followed by a liver specialist in [place]. Her current symptoms started about a week ago.  She had some chills but denies any fever.  Has had nausea with poor oral intake.  Has had a few episodes of vomiting.  Couple episodes of loose stool which she attributes to taking antiemetic tablets.  She has had 2 visits to the emergency department prior to tonight.  She was thought to have a ruptured ovarian cyst.  She was prescribed naproxen.  She comes back today with the worsening symptoms.  Pain is 6 out of 10 in intensity.  Diffusely present all over the abdomen without any radiation.  No obvious precipitant identified.  No aggravating or relieving factors.  Denies any vaginal discharge.  No dysuria.  No bleeding. In the emergency department she underwent blood work which showed hyponatremia,  elevated BUN and creatinine.  She underwent a CT scan of the abdomen pelvis which showed moderate ascites.  She subsequently underwent paracentesis by the ER provider.  She will need hospitalization for further evaluation and management.  Hospital Course / Discharge diagnoses: Principal Problem Acute kidney injury, hyponatremia-previous renal function was normal, she was admitted with a creatinine 3.77 on the day of admission.  Nephrology was initially consulted, she ended up having bladder outlet obstruction requiring Foley catheter placement.  She failed spontaneous voiding trials x2.  Her renal function has now normalized.  She was discharged with a Foley catheter and she has an appointment with urology the same day of discharge.  Active Problems Ascites, hep B infection  -Likely due to portal hypertension, liver does not look cirrhotic on imaging.  She is status post liver biopsy by IR, transjugular approach.  Biopsy showed grade 1 to fibrosis as well as low-grade chronic hepatitis. Appreciate gastroenterology follow-up, she has been placed on low-dose diuretics, continue. There was an incidental adherent sludge in the gallbladder neck, 1.4 cm, she will need a repeat ultrasound in 3 months She will need follow-up with her hepatologist at [facility] for further evaluation.  She has not seen her hepatologist in 1 & 1/2 years  History of alcohol abuse -Cessation recommended  Possible pleural effusion -Noted on 2d echo. Recent CXR on 11/4, reviewed, was clear. Repeat CXR reviewed, only trace effusion can be appreciated. Remains on room air  Discharge Instructions   Allergies as of 04/20/2020      Reactions   Contrast Media [iodinated Diagnostic Agents] Anaphylaxis,  Shortness Of Breath, Rash   IV contrast      Medication List    STOP taking these medications   HYDROcodone-acetaminophen 5-325 MG tablet Commonly known as: NORCO/VICODIN   ibuprofen 200 MG tablet Commonly known as:  ADVIL   naproxen 375 MG tablet Commonly known as: NAPROSYN     TAKE these medications   docusate sodium 100 MG capsule Commonly known as: Colace Take 1 capsule (100 mg total) by mouth 2 (two) times daily.   furosemide 20 MG tablet Commonly known as: LASIX Take 1 tablet (20 mg total) by mouth daily.   ondansetron 4 MG disintegrating tablet Commonly known as: Zofran ODT Take 1 tablet (4 mg total) by mouth every 8 (eight) hours as needed for nausea or vomiting.   oxyCODONE 5 MG immediate release tablet Commonly known as: Oxy IR/ROXICODONE Take 1 tablet (5 mg total) by mouth every 4 (four) hours as needed for moderate pain.   polyethylene glycol 236 g solution Commonly known as: GoLYTELY Drink 8 oz solution every 1-2 hours as needed for constipation   spironolactone 25 MG tablet Commonly known as: ALDACTONE Take 1 tablet (25 mg total) by mouth daily.        Consultations:  Gastroenterology   Procedures/Studies:  Liver biopsy  CT ABDOMEN PELVIS WO CONTRAST  Result Date: 04/13/2020 CLINICAL DATA:  Acute severe nonlocalized abdominal pain with rebound tenderness. Concern for acute abdomen. Recent diagnosis of ruptured ovarian cyst. EXAM: CT ABDOMEN AND PELVIS WITHOUT CONTRAST TECHNIQUE: Multidetector CT imaging of the abdomen and pelvis was performed following the standard protocol without IV contrast. COMPARISON:  Abdominal CT and pelvic ultrasound 6 days ago 04/07/2020. CT 06/26/2018 also reviewed. FINDINGS: Lower chest: The lung bases are clear. No focal consolidation or pleural effusion Hepatobiliary: No focal liver abnormality is seen. Borderline hepatic steatosis. No gallstones, gallbladder wall thickening, or biliary dilatation. Pancreas: No ductal dilatation or inflammation. Spleen: Normal in size without focal abnormality. Adrenals/Urinary Tract: Normal adrenal glands. No renal stone or hydronephrosis. No perinephric edema. Urinary bladder is physiologically distended.  There is a contour deformity of the bladder dome which may represent a bladder diverticulum, for example series 6, image 81. No bladder wall thickening. Stomach/Bowel: Bowel evaluation is limited in the absence of contrast and presence of intra-abdominal ascites. Ingested material within the stomach. No obvious gastric wall thickening. Decompressed small bowel without inflammation or obvious wall thickening. Liquid stool in the colon. No formed stool. The appendix is not well seen on the current exam. No evidence of appendicitis. Vascular/Lymphatic: Abdominal aorta is normal in caliber. Duplicated IVC. No adenopathy. Reproductive: Uterus is unremarkable. Ovaries tentatively visualized and symmetric in size. No adnexal mass. Other: Moderate to large volume of abdominal and pelvic ascites, progressed from recent exam. Ascites was also seen on January 2020 CT. The etiology is indeterminate. Ascites measures simple fluid density. There is no hemorrhagic component. No free air. Musculoskeletal: There are no acute or suspicious osseous abnormalities. IMPRESSION: 1. Moderate to large volume of abdominal and pelvic ascites, progressed from recent exam. Cause for ascites is not identified. There is no hemorrhagic component nor free air. Consider fluid sampling. 2. Contour deformity of the bladder dome may represent a bladder diverticulum. 3. Duplicated IVC. Electronically Signed   By: Narda Rutherford M.D.   On: 04/13/2020 20:02   DG Chest 2 View  Result Date: 04/13/2020 CLINICAL DATA:  Shortness of breath EXAM: CHEST - 2 VIEW COMPARISON:  08/05/2014 FINDINGS: Cardiac shadow is within normal limits.  The lungs are well aerated bilaterally. No focal infiltrate or sizable effusion is seen. No acute bony abnormality is noted. IMPRESSION: No active cardiopulmonary disease. Electronically Signed   By: Alcide Clever M.D.   On: 04/13/2020 18:44   US Transvaginal Non-OB  Result Date: 04/07/2020 CLINICAL DATA:  Pelvic pain  EXAM: TRANSABDOMINAL AND TRANSVAGINAL ULTRASOUND OF PELVIS DOPPLER ULTRASOUND OF OVARIES TECHNIQUE: Both transabdominal and transvaginal ultrasound examinations of the pelvis were performed. Transabdominal technique was performed for global imaging of the pelvis including uterus, ovaries, adnexal regions, and pelvic cul-de-sac. It was necessary to proceed with endovaginal exam following the transabdominal exam to visualize the ovaries. Color and duplex Doppler ultrasound was utilized to evaluate blood flow to the ovaries. COMPARISON:  CT dated 04/07/2020 FINDINGS: Uterus Measurements: 8.8 x 3.8 x 4.7 cm = volume: 81 mL. No fibroids or other mass visualized. There is a small amount of free fluid in the lower uterine segment. There is a probable nabothian cyst measuring 5 mm. Endometrium Thickness: 9 mm.  No focal abnormality visualized. Right ovary Measurements: 3 x 2.1 x 2.3 cm = volume: 7 mL. Normal appearance/no adnexal mass. Left ovary Measurements: 2.6 x 1.7 x 1.8 cm = volume: 4 mL. Normal appearance/no adnexal mass. Pulsed Doppler evaluation of both ovaries demonstrates normal low-resistance arterial and venous waveforms. Other findings There is a small volume of pelvic free fluid. This is better appreciated on the patient's prior CT. IMPRESSION: No acute abnormality.  No evidence for ovarian torsion. Electronically Signed   By: Katherine Mantle M.D.   On: 04/07/2020 17:28   US Pelvis Complete  Result Date: 04/07/2020 CLINICAL DATA:  Pelvic pain EXAM: TRANSABDOMINAL AND TRANSVAGINAL ULTRASOUND OF PELVIS DOPPLER ULTRASOUND OF OVARIES TECHNIQUE: Both transabdominal and transvaginal ultrasound examinations of the pelvis were performed. Transabdominal technique was performed for global imaging of the pelvis including uterus, ovaries, adnexal regions, and pelvic cul-de-sac. It was necessary to proceed with endovaginal exam following the transabdominal exam to visualize the ovaries. Color and duplex Doppler  ultrasound was utilized to evaluate blood flow to the ovaries. COMPARISON:  CT dated 04/07/2020 FINDINGS: Uterus Measurements: 8.8 x 3.8 x 4.7 cm = volume: 81 mL. No fibroids or other mass visualized. There is a small amount of free fluid in the lower uterine segment. There is a probable nabothian cyst measuring 5 mm. Endometrium Thickness: 9 mm.  No focal abnormality visualized. Right ovary Measurements: 3 x 2.1 x 2.3 cm = volume: 7 mL. Normal appearance/no adnexal mass. Left ovary Measurements: 2.6 x 1.7 x 1.8 cm = volume: 4 mL. Normal appearance/no adnexal mass. Pulsed Doppler evaluation of both ovaries demonstrates normal low-resistance arterial and venous waveforms. Other findings There is a small volume of pelvic free fluid. This is better appreciated on the patient's prior CT. IMPRESSION: No acute abnormality.  No evidence for ovarian torsion. Electronically Signed   By: Katherine Mantle M.D.   On: 04/07/2020 17:28   IR Venogram Hepatic W Hemodynamic Evaluation  Result Date: 04/18/2020 CLINICAL DATA:  Hepatitis B, ascites.  Contrast allergy. EXAM: HEPATIC VENOGRAM HEPATIC AND  WEDGED PORTAL HEMODYNAMICS TRANSJUGULAR LIVER BIOPSY FLUOROSCOPY TIME:  7 minutes 42 seconds; 89 mGy PROCEDURE: The procedure, risks (including but not limited to bleeding, infection, organ damage ), benefits, and alternatives were explained to the patient. Questions regarding the procedure were encouraged and answered. The patient understands and consents to the procedure. Patency of the right IJ vein was confirmed with ultrasound with image documentation. An appropriate skin site  was determined. Skin site was marked, prepped with chlorhexidine, and draped using maximum barrier technique. The region was infiltrated locally with 1% lidocaine. Intravenous Fentanyl and Versed 4mg  were administered as conscious sedation during continuous monitoring of the patient's level of consciousness and physiological / cardiorespiratory  status by the radiology RN, with a total moderate sedation time of 38 minutes. Under real-time ultrasound guidance, the right IJ vein was accessed with a 21 gauge micropuncture needle; the needle tip within the vein was confirmed with ultrasound image documentation. . Needle was exchanged over guidewire for transitional dilator, which allowed passage of a Bentson wire into the IVC. Over this, a 5 [place] angiographic catheter was advanced and used to selectively catheterize the right hepatic vein. Confirmatory CO2 venography was performed. Hepatic venous pressures and wedged portal venous pressures were obtained through the catheter. Both demonstrated significant respiratory phasicity. Maximum hepatic venous pressure 17 mmHg, maximum wedged portal venous pressure 30 mmHg. The catheter was exchanged over the guidewire for a vascular sheath through which the guide of the transjugular core biopsy needle was advanced. Through this, multiple 18-gauge core liver biopsy samples were obtained. The sheath was removed and hemostasis achieved at the site. Patient tolerated procedure well, with no immediate complication. IMPRESSION: 1. Right hepatic venogram confirms appropriate catheter position. 2. Hepatic hemodynamics reveal 13 mmHg portosystemic venous pressure gradient (normal <=51mmHg). 3. Technically successful transjugular core liver biopsy . Electronically Signed   By: Corlis Leak M.D.   On: 04/18/2020 14:57   IR Transcatheter BX  Result Date: 04/18/2020 CLINICAL DATA:  Hepatitis B, ascites.  Contrast allergy. EXAM: HEPATIC VENOGRAM HEPATIC AND  WEDGED PORTAL HEMODYNAMICS TRANSJUGULAR LIVER BIOPSY FLUOROSCOPY TIME:  7 minutes 42 seconds; 89 mGy PROCEDURE: The procedure, risks (including but not limited to bleeding, infection, organ damage ), benefits, and alternatives were explained to the patient. Questions regarding the procedure were encouraged and answered. The patient understands and consents to the procedure.  Patency of the right IJ vein was confirmed with ultrasound with image documentation. An appropriate skin site was determined. Skin site was marked, prepped with chlorhexidine, and draped using maximum barrier technique. The region was infiltrated locally with 1% lidocaine. Intravenous Fentanyl and Versed 4mg  were administered as conscious sedation during continuous monitoring of the patient's level of consciousness and physiological / cardiorespiratory status by the radiology RN, with a total moderate sedation time of 38 minutes. Under real-time ultrasound guidance, the right IJ vein was accessed with a 21 gauge micropuncture needle; the needle tip within the vein was confirmed with ultrasound image documentation. . Needle was exchanged over guidewire for transitional dilator, which allowed passage of a Bentson wire into the IVC. Over this, a 5 [place] angiographic catheter was advanced and used to selectively catheterize the right hepatic vein. Confirmatory CO2 venography was performed. Hepatic venous pressures and wedged portal venous pressures were obtained through the catheter. Both demonstrated significant respiratory phasicity. Maximum hepatic venous pressure 17 mmHg, maximum wedged portal venous pressure 30 mmHg. The catheter was exchanged over the guidewire for a vascular sheath through which the guide of the transjugular core biopsy needle was advanced. Through this, multiple 18-gauge core liver biopsy samples were obtained. The sheath was removed and hemostasis achieved at the site. Patient tolerated procedure well, with no immediate complication. IMPRESSION: 1. Right hepatic venogram confirms appropriate catheter position. 2. Hepatic hemodynamics reveal 13 mmHg portosystemic venous pressure gradient (normal <=79mmHg). 3. Technically successful transjugular core liver biopsy . Electronically Signed   By: Algis Downs  Deanne Coffer M.D.   On: 04/18/2020 14:57   US RENAL  Result Date: 04/15/2020 CLINICAL DATA:   Acute kidney injury EXAM: RENAL / URINARY TRACT ULTRASOUND COMPLETE COMPARISON:  CT abdomen pelvis 04/13/2020 FINDINGS: Right Kidney: Renal measurements: 9.5 x 5.7 x 5.4 cm = volume: 153 mL. Echogenicity within normal limits. No mass or hydronephrosis visualized. Left Kidney: Renal measurements: 9.7 by 6.0 x 6.0 cm = volume: 186 mL. Echogenicity within normal limits. No mass or hydronephrosis visualized. Bladder: Not visualized. Other: Ascites. IMPRESSION: 1.  Unremarkable sonographic appearance of the bilateral kidneys. 2.  Bladder could not be definitely visualized. Electronically Signed   By: Emmaline Kluver M.D.   On: 04/15/2020 15:54   [place] Art/Ven Flow Abd Pelv Doppler  Result Date: 04/07/2020 CLINICAL DATA:  Pelvic pain EXAM: TRANSABDOMINAL AND TRANSVAGINAL ULTRASOUND OF PELVIS DOPPLER ULTRASOUND OF OVARIES TECHNIQUE: Both transabdominal and transvaginal ultrasound examinations of the pelvis were performed. Transabdominal technique was performed for global imaging of the pelvis including uterus, ovaries, adnexal regions, and pelvic cul-de-sac. It was necessary to proceed with endovaginal exam following the transabdominal exam to visualize the ovaries. Color and duplex Doppler ultrasound was utilized to evaluate blood flow to the ovaries. COMPARISON:  CT dated 04/07/2020 FINDINGS: Uterus Measurements: 8.8 x 3.8 x 4.7 cm = volume: 81 mL. No fibroids or other mass visualized. There is a small amount of free fluid in the lower uterine segment. There is a probable nabothian cyst measuring 5 mm. Endometrium Thickness: 9 mm.  No focal abnormality visualized. Right ovary Measurements: 3 x 2.1 x 2.3 cm = volume: 7 mL. Normal appearance/no adnexal mass. Left ovary Measurements: 2.6 x 1.7 x 1.8 cm = volume: 4 mL. Normal appearance/no adnexal mass. Pulsed Doppler evaluation of both ovaries demonstrates normal low-resistance arterial and venous waveforms. Other findings There is a small volume of pelvic free fluid.  This is better appreciated on the patient's prior CT. IMPRESSION: No acute abnormality.  No evidence for ovarian torsion. Electronically Signed   By: Katherine Mantle M.D.   On: 04/07/2020 17:28   IR US Guide Vasc Access Right  Result Date: 04/18/2020 CLINICAL DATA:  Hepatitis B, ascites.  Contrast allergy. EXAM: HEPATIC VENOGRAM HEPATIC AND  WEDGED PORTAL HEMODYNAMICS TRANSJUGULAR LIVER BIOPSY FLUOROSCOPY TIME:  7 minutes 42 seconds; 89 mGy PROCEDURE: The procedure, risks (including but not limited to bleeding, infection, organ damage ), benefits, and alternatives were explained to the patient. Questions regarding the procedure were encouraged and answered. The patient understands and consents to the procedure. Patency of the right IJ vein was confirmed with ultrasound with image documentation. An appropriate skin site was determined. Skin site was marked, prepped with chlorhexidine, and draped using maximum barrier technique. The region was infiltrated locally with 1% lidocaine. Intravenous Fentanyl and Versed 4mg  were administered as conscious sedation during continuous monitoring of the patient's level of consciousness and physiological / cardiorespiratory status by the radiology RN, with a total moderate sedation time of 38 minutes. Under real-time ultrasound guidance, the right IJ vein was accessed with a 21 gauge micropuncture needle; the needle tip within the vein was confirmed with ultrasound image documentation. . Needle was exchanged over guidewire for transitional dilator, which allowed passage of a Bentson wire into the IVC. Over this, a 5 angiographic catheter was advanced and used to selectively catheterize the right hepatic vein. Confirmatory CO2 venography was performed. Hepatic venous pressures and wedged portal venous pressures were obtained through the catheter. Both demonstrated significant respiratory phasicity.  Maximum hepatic venous pressure 17 mmHg, maximum wedged portal  venous pressure 30 mmHg. The catheter was exchanged over the guidewire for a vascular sheath through which the guide of the transjugular core biopsy needle was advanced. Through this, multiple 18-gauge core liver biopsy samples were obtained. The sheath was removed and hemostasis achieved at the site. Patient tolerated procedure well, with no immediate complication. IMPRESSION: 1. Right hepatic venogram confirms appropriate catheter position. 2. Hepatic hemodynamics reveal 13 mmHg portosystemic venous pressure gradient (normal <=11mmHg). 3. Technically successful transjugular core liver biopsy . Electronically Signed   By: Corlis Leak M.D.   On: 04/18/2020 14:57   DG CHEST PORT 1 VIEW  Result Date: 04/15/2020 CLINICAL DATA:  Pleural effusions, recurrent ascites, chronic hepatitis-B EXAM: PORTABLE CHEST 1 VIEW COMPARISON:  04/13/2020 chest radiograph. FINDINGS: Stable cardiomediastinal silhouette with normal heart size. No pneumothorax. Slight blunting of the costophrenic angles bilaterally, cannot exclude trace pleural effusions. No pulmonary edema. Minimal left basilar atelectasis. IMPRESSION: Slight blunting of the costophrenic angles bilaterally, cannot exclude trace pleural effusions. Minimal left basilar atelectasis. Electronically Signed   By: Delbert Phenix M.D.   On: 04/15/2020 17:19   DG Abd 2 Views  Result Date: 04/12/2020 CLINICAL DATA:  Abdominal pain for 5 days. EXAM: ABDOMEN - 2 VIEW COMPARISON:  Abdominal CT and pelvic ultrasound 04/07/2020, 5 days ago. FINDINGS: No free intra-abdominal air. No bowel dilatation to suggest obstruction. There is a moderate volume of stool in the ascending, transverse, and descending colon. No abnormal rectal distension. Pelvic calcifications are unchanged from prior exam and consistent with phleboliths. No radiopaque calculi. The lung bases are clear. No osseous abnormalities are seen. IMPRESSION: Normal bowel gas pattern. Moderate colonic stool burden.  Electronically Signed   By: Narda Rutherford M.D.   On: 04/12/2020 15:28   ECHOCARDIOGRAM COMPLETE  Result Date: 04/14/2020    ECHOCARDIOGRAM REPORT   Patient Name:   Kim Pacheco Date of Exam: 04/14/2020 Medical Rec #:  [id number]               Height:       60.0 in Accession #:    [id number]              Weight:       134.1 lb Date of Birth:  [birth year]               BSA:          1.575 m Patient Age:    34 years                BP:           119/86 mmHg Patient Gender: F                       HR:           81 bpm. Exam Location:  Inpatient Procedure: 2D Echo Indications:    CHF-Acute Diastolic 428.31 / I50.31  History:        Patient has no prior history of Echocardiogram examinations.                 Past medical history of hepatitis B, ascites of unclear                 etiology.  Sonographer:    Leta Jungling RDCS Referring Phys: [phone] STEPHEN K CHIU  Sonographer Comments: Technically difficult study due to poor echo windows. Exam ended per patients request.  IMPRESSIONS  1. Left ventricular ejection fraction, by estimation, is 55 to 60%. The left ventricle has normal function. The left ventricle has no regional wall motion abnormalities. Left ventricular diastolic function could not be evaluated.  2. Right ventricular systolic function is normal. The right ventricular size is normal.  3. Large pleural effusion.  4. The mitral valve is normal in structure. No evidence of mitral valve regurgitation.  5. The aortic valve is normal in structure. Aortic valve regurgitation is not visualized. FINDINGS  Left Ventricle: Left ventricular ejection fraction, by estimation, is 55 to 60%. The left ventricle has normal function. The left ventricle has no regional wall motion abnormalities. The left ventricular internal cavity size was normal in size. There is  no left ventricular hypertrophy. Left ventricular diastolic function could not be evaluated. Right Ventricle: The right ventricular size is normal. Right  vetricular wall thickness was not assessed. Right ventricular systolic function is normal. Left [facility]: Left atrial size was normal in size. Right [facility]: Right atrial size was normal in size. Pericardium: There is no evidence of pericardial effusion. Mitral Valve: The mitral valve is normal in structure. No evidence of mitral valve regurgitation. Tricuspid Valve: The tricuspid valve is normal in structure. Tricuspid valve regurgitation is trivial. Aortic Valve: The aortic valve is normal in structure. Aortic valve regurgitation is not visualized. Pulmonic Valve: The pulmonic valve was normal in structure. Pulmonic valve regurgitation is not visualized. Aorta: The aortic root is normal in size and structure. IAS/Shunts: The interatrial septum was not assessed. Additional Comments: There is a large pleural effusion.  LEFT VENTRICLE PLAX 2D LVIDd:         3.50 cm  Diastology LVIDs:         2.40 cm  LV e' medial:    5.22 cm/s LV PW:         0.80 cm  LV E/e' medial:  9.3 LV IVS:        0.70 cm  LV e' lateral:   6.42 cm/s LVOT diam:     1.80 cm  LV E/e' lateral: 7.5 LV SV:         41 LV SV Index:   26 LVOT Area:     2.54 cm  RIGHT VENTRICLE RV S prime:     8.05 cm/s LEFT ATRIUM             Index       RIGHT ATRIUM          Index LA diam:        3.10 cm 1.97 cm/m  RA Area:     7.94 cm LA Vol (A2C):   17.8 ml 11.30 ml/m RA Volume:   12.60 ml 8.00 ml/m LA Vol (A4C):   22.3 ml 14.16 ml/m LA Biplane Vol: 20.0 ml 12.70 ml/m  AORTIC VALVE LVOT Vmax:   86.80 cm/s LVOT Vmean:  56.600 cm/s LVOT VTI:    0.161 m  AORTA Ao Root diam: 2.30 cm MITRAL VALVE MV Area (PHT): 2.99 cm    SHUNTS MV Decel Time: 254 msec    Systemic VTI:  0.16 m MV E velocity: 48.40 cm/s  Systemic Diam: 1.80 cm MV A velocity: 66.80 cm/s MV E/A ratio:  0.72 Dietrich Pates MD Electronically signed by Dietrich Pates MD Signature Date/Time: 04/14/2020/7:29:20 PM    Final    CT Renal Stone Study  Result Date: 04/07/2020 CLINICAL DATA:  Lower abdominal and back  region pain.  Dysuria. EXAM: CT ABDOMEN AND  PELVIS WITHOUT CONTRAST TECHNIQUE: Multidetector CT imaging of the abdomen and pelvis was performed following the standard protocol without oral or IV contrast. COMPARISON:  June 26, 2018 FINDINGS: Lower chest: There is mild bibasilar atelectasis. There is no lung base edema or consolidation. Hepatobiliary: There is hepatic steatosis. No focal liver lesions are evident. Gallbladder wall is not appreciably thickened. There is no biliary duct dilatation. Pancreas: There is no pancreatic mass or inflammatory focus. Spleen: No splenic lesions are evident. Adrenals/Urinary Tract: Adrenals bilaterally appear normal. Kidneys bilaterally show no evident mass or hydronephrosis on either side. There is no appreciable renal or ureteral calculus. Urinary bladder is midline with wall thickness within normal limits. Stomach/Bowel: There is no appreciable bowel wall or mesenteric thickening. No evident bowel obstruction. The terminal ileum appears normal. There is no appreciable free air or portal venous air. Vascular/Lymphatic: There is no abdominal aortic aneurysm. No vascular lesions are evident on this noncontrast enhanced study. Reproductive: The uterus is anteverted. No adnexal mass. There is moderate fluid in the cul-de-sac. Other: The appendix appears normal. No evident abscess in the abdomen or pelvis. There is no ascites evident beyond the fluid in the cul-de-sac. Musculoskeletal: No blastic or lytic bone lesions. No appreciable abdominal wall or intramuscular lesions. IMPRESSION: 1. Moderate fluid in the cul-de-sac. Suspect recent ovarian cyst rupture. No adnexal masses evident by CT. 2. No evident renal or ureteral calculus on either side. No hydronephrosis. Urinary bladder wall thickness normal. 3. No bowel wall thickening or bowel obstruction. No abscess in the abdomen or pelvis. Appendix appears normal. 4.  Hepatic steatosis. Electronically Signed   By: Bretta Bang III M.D.   On: 04/07/2020 15:40   US LIVER DOPPLER  Result Date: 04/16/2020 CLINICAL DATA:  Ascites, abnormal LFTs, hepatic steatosis EXAM: DUPLEX ULTRASOUND OF LIVER TECHNIQUE: Color and duplex Doppler ultrasound was performed to evaluate the hepatic in-flow and out-flow vessels. COMPARISON:  06/23/2018 CT with contrast FINDINGS: Liver: Slight increased echogenicity. Normal hepatic contour without nodularity. No focal lesion, mass or intrahepatic biliary ductal dilatation. Main Portal Vein size: 1.0 cm Portal Vein Velocities Main Prox:  15 cm/sec Main Mid: 18 cm/sec Main Dist:  13 cm/sec Right: 8.0 cm/sec Left: 8.0 cm/sec Hepatic Vein Velocities Right:  18 cm/sec Middle:  14 cm/sec Left:  6 cm/sec IVC: Present and patent with normal respiratory phasicity. Hepatic Artery Velocity:  36 cm/sec Splenic Vein Velocity:  15 cm/sec Spleen: 7.6 cm x 8.1 cm x 7.1 cm with a total volume of 229 cm^3 (411 cm^3 is upper limit normal) Portal Vein Occlusion/Thrombus: No Splenic Vein Occlusion/Thrombus: No Ascites: Small amount of upper abdominal ascites Varices: Not visualized Patent portal, hepatic and splenic veins with normal directional flow. Negative for portal vein occlusion or thrombus. Along the gallbladder neck anterior wall, there is a echogenic slightly lobulated intraluminal abnormality or mass measuring 1.4 x 1.1 x 1.0 cm without shadowing. This remains nonspecific and may represent adherent gallbladder sludge. Consider follow-up short-term right upper quadrant ultrasound. IMPRESSION: Normal hepatic venous Doppler. Suspect mild hepatic steatosis Perihepatic small volume of ascites 1.4 cm gallbladder neck anterior wall echogenic nodular abnormality, indeterminate but may represent adherent gallbladder sludge. Consider short-term follow-up right upper quadrant ultrasound at 3 months and if persistent consider further imaging with MRI. Electronically Signed   By: Judie Petit.  Shick M.D.   On: 04/16/2020 12:56       Subjective: - no chest pain, shortness of breath, no abdominal pain, nausea or vomiting.   Discharge Exam:  BP (!) 95/57 (BP Location: Right Arm)   Pulse 63   Temp 98.2 F (36.8 C) (Axillary)   Resp 15   Ht 5' (1.524 m)   Wt 60.6 kg   LMP 02/29/2020   SpO2 98%   BMI 26.09 kg/m   General: Pt is alert, awake, not in acute distress Extremities: no edema, no cyanosis    The results of significant diagnostics from this hospitalization (including imaging, microbiology, ancillary and laboratory) are listed below for reference.     Microbiology: Recent Results (from the past 240 hour(s))  Body fluid culture     Status: None   Collection Time: 04/13/20  9:10 PM   Specimen: Peritoneal Cavity; Peritoneal Fluid  Result Value Ref Range Status   Specimen Description   Final    PERITONEAL CAVITY Performed at [facility], [address] [place], [place] [address]    Special Requests   Final    NONE Performed at [facility], [address] [place], [place] [address]    Gram Stain   Final    WBC PRESENT, PREDOMINANTLY PMN NO ORGANISMS SEEN CYTOSPIN SMEAR    Culture   Final    NO GROWTH 3 DAYS Performed at [facility], [address] [place], [place] [address]    Report Status 04/17/2020 FINAL  Final  Respiratory Panel by RT PCR (Flu A&B, Covid) - Nasopharyngeal Swab     Status: None   Collection Time: 04/13/20 10:24 PM   Specimen: Nasopharyngeal Swab  Result Value Ref Range Status   SARS Coronavirus 2 by RT PCR NEGATIVE NEGATIVE Final    Comment: (NOTE) SARS-CoV-2 target nucleic acids are NOT DETECTED.  The SARS-CoV-2 RNA is generally detectable in upper respiratoy specimens during the acute phase of infection. The lowest concentration of SARS-CoV-2 viral copies this assay can detect is 131 copies/mL. A negative result does not preclude SARS-Cov-2 infection and should not be used as the sole basis for treatment  or other patient management decisions. A negative result may occur with  improper specimen collection/handling, submission of specimen other than nasopharyngeal swab, presence of viral mutation(s) within the areas targeted by this assay, and inadequate number of viral copies (<131 copies/mL). A negative result must be combined with clinical observations, patient history, and epidemiological information. The expected result is Negative.  Fact Sheet for Patients:  https://www.moore.com/https://www.fda.gov/media/142436/download  Fact Sheet for Healthcare Providers:  https://www.young.biz/https://www.fda.gov/media/142435/download  This test is no t yet approved or cleared by the [place] FDA and  has been authorized for detection and/or diagnosis of SARS-CoV-2 by FDA under an Emergency Use Authorization (EUA). This EUA will remain  in effect (meaning this test can be used) for the duration of the COVID-19 declaration under Section 564(b)(1) of the Act, 21 U.S.C. section 360bbb-3(b)(1), unless the authorization is terminated or revoked sooner.     Influenza A by PCR NEGATIVE NEGATIVE Final   Influenza B by PCR NEGATIVE NEGATIVE Final    Comment: (NOTE) The Xpert Xpress SARS-CoV-2/FLU/RSV assay is intended as an aid in  the diagnosis of influenza from Nasopharyngeal swab specimens and  should not be used as a sole basis for treatment. Nasal washings and  aspirates are unacceptable for Xpert Xpress SARS-CoV-2/FLU/RSV  testing.  Fact Sheet for Patients: https://www.moore.com/https://www.fda.gov/media/142436/download  Fact Sheet for Healthcare Providers: https://www.young.biz/https://www.fda.gov/media/142435/download  This test is not yet approved or cleared by the [place] FDA and  has been authorized for detection and/or diagnosis of SARS-CoV-2 by  FDA under an Emergency Use Authorization (EUA). This EUA will remain  in effect (meaning this test can be used) for the duration of the  Covid-19 declaration under Section 564(b)(1) of the Act, 21  U.S.C.  section 360bbb-3(b)(1), unless the authorization is  terminated or revoked. Performed at [facility], [address], [place], [place] [address]      Labs: Basic Metabolic Panel: Recent Labs  Lab 04/15/20 0520 04/16/20 0437 04/17/20 0354 04/18/20 0421 04/19/20 0408  NA 136 140 136 138 135  K 4.2 4.0 3.7 3.2* 4.2  CL 106 110 106 105 104  CO2 19* GLUCOSE 93 85 88 95 105*  BUN 28* 5*  CREATININE 1.81* 0.61 0.64 0.80 0.69  CALCIUM 8.7* 8.0* 7.9* 8.0* 8.6*  MG 2.1  --   --   --   --    Liver Function Tests: Recent Labs  Lab 04/15/20 0520 04/16/20 0437 04/17/20 0354 04/18/20 0421 04/19/20 0408  AST 142*  ALT 120*  ALKPHOS 46 39 39 37* 43  BILITOT 1.3* 0.8 0.7 0.8 0.5  PROT 7.4 5.7* 5.7* 5.7* 7.0  ALBUMIN 3.7 2.9* 2.9* 3.0* 3.4*   CBC: Recent Labs  Lab 04/13/20 1830 04/14/20 0419 04/15/20 0520 04/18/20 0421  WBC 8.0 8.6 6.3 6.1  NEUTROABS 4.9  --   --  2.6  HGB 15.2* 14.8 12.9 10.9*  HCT 43.8 43.5 38.1 32.1*  MCV 89.9 90.1 90.9 90.2  PLT 270 270 231 193   CBG: No results for input(s): GLUCAP in the last 168 hours. Hgb A1c No results for input(s): HGBA1C in the last 72 hours. Lipid Profile No results for input(s): CHOL, HDL, LDLCALC, TRIG, CHOLHDL, LDLDIRECT in the last 72 hours. Thyroid function studies No results for input(s): TSH, T4TOTAL, T3FREE, THYROIDAB in the last 72 hours.  Invalid input(s): FREET3 Urinalysis    Component Value Date/Time   COLORURINE YELLOW 04/13/2020 2034   APPEARANCEUR HAZY (A) 04/13/2020 2034   LABSPEC 1.008 04/13/2020 2034   PHURINE 5.0 04/13/2020 2034   GLUCOSEU NEGATIVE 04/13/2020 2034   HGBUR NEGATIVE 04/13/2020 2034   BILIRUBINUR NEGATIVE 04/13/2020 2034   BILIRUBINUR negative 05/31/2016 1707   KETONESUR NEGATIVE 04/13/2020 2034   PROTEINUR NEGATIVE 04/13/2020 2034   UROBILINOGEN 0.2 05/31/2016 1707   NITRITE NEGATIVE 04/13/2020 2034   LEUKOCYTESUR  TRACE (A) 04/13/2020 2034    FURTHER DISCHARGE INSTRUCTIONS:   Get Medicines reviewed and adjusted: Please take all your medications with you for your next visit with your Primary MD   Laboratory/radiological data: Please request your Primary MD to go over all hospital tests and procedure/radiological results at the follow up, please ask your Primary MD to get all Hospital records sent to his/her office.   In some cases, they will be blood work, cultures and biopsy results pending at the time of your discharge. Please request that your primary care M.D. goes through all the records of your hospital data and follows up on these results.   Also Note the following: If you experience worsening of your admission symptoms, develop shortness of breath, life threatening emergency, suicidal or homicidal thoughts you must seek medical attention immediately by calling 911 or calling your MD immediately  if symptoms less severe.   You must read complete instructions/literature along with all the possible adverse reactions/side effects for all the Medicines you take and that have been prescribed to you.  Take any new Medicines after you have completely understood and accpet all the possible adverse reactions/side effects.    Do not drive when taking Pain medications or sleeping medications (Benzodaizepines)   Do not take more than prescribed Pain, Sleep and Anxiety Medications. It is not advisable to combine anxiety,sleep and pain medications without talking with your primary care practitioner   Special Instructions: If you have smoked or chewed Tobacco  in the last 2 yrs please stop smoking, stop any regular Alcohol  and or any Recreational drug use.   Wear Seat belts while driving.   Please note: You were cared for by a hospitalist during your hospital stay. Once you are discharged, your primary care physician will handle any further medical issues. Please note that NO REFILLS for any discharge  medications will be authorized once you are discharged, as it is imperative that you return to your primary care physician (or establish a relationship with a primary care physician if you do not have one) for your post hospital discharge needs so that they can reassess your need for medications and monitor your lab values.  Time coordinating discharge: 35 minutes  SIGNED:  Pamella Pert, MD, PhD 04/20/2020, 8:18 AM

## 2020-04-20 NOTE — Discharge Instructions (Signed)
FURTHER DISCHARGE INSTRUCTIONS:  Get Medicines reviewed and adjusted: Please take all your medications with you for your next visit with your Primary MD  Laboratory/radiological data: Please request your Primary MD to go over all hospital tests and procedure/radiological results at the follow up, pleaseask your Primary MD to get all Hospital records sent tohis/heroffice.  In some cases, they will be blood work, cultures and biopsy results pending at the time of your discharge. Please request that your primary care M.D. goes through all the records of your hospital data and follows up on these results.  Also Note the following: If you experience worsening of your admission symptoms, develop shortness of breath, life threatening emergency, suicidal or homicidal thoughts you must seek medical attention immediately by calling 911 or calling your MD immediately if symptoms less severe.  You must read complete instructions/literature along with all the possible adverse reactions/side effects for all the Medicines you take and that have been prescribed to you. Take any new Medicines after you have completely understood and accpet all the possible adverse reactions/side effects.   Do not drive when taking Pain medicationsor sleeping medications (Benzodaizepines)  Do not take more than prescribed Pain, Sleep and Anxiety Medications. It is not advisable to combine anxiety,sleep and pain medications without talking with your primary care practitioner  Special Instructions: If you have smoked or chewed Tobacco in the last 2 yrs please stop smoking, stop any regular Alcohol and or any Recreational drug use.  Wear Seat belts while driving.  Please note: You were cared for by a hospitalist during your hospital stay. Once you are discharged, your primary care physician will handle any further medical issues. Please note that NO REFILLS for any discharge medications will be authorized once  you are discharged, as it is imperative that you return to your primary care physician (or establish a relationship with a primary care physician if you do not have one) for your post hospital discharge needs so that they can reassess your need for medications and monitor your lab values.  Ascites  Ascites is a collection of too much fluid in the abdomen. Ascites can range from mild to severe. If ascites is not treated, it can get worse. What are the causes? This condition may be caused by:  A liver condition called cirrhosis. This is the most common cause of ascites.  Long-term (chronic) or alcoholic hepatitis.  Infection or inflammation in the abdomen.  Cancer in the abdomen.  Heart failure.  Kidney disease.  Inflammation of the pancreas.  Clots in the veins of the liver. What are the signs or symptoms? Symptoms of this condition include:  A feeling of fullness in the abdomen. This is common.  An increase in the size of the abdomen or waist.  Swelling in the legs.  Swelling of the scrotum (in men).  Difficulty breathing.  Pain in the abdomen.  Sudden weight gain. If the condition is mild, you may not have symptoms. How is this diagnosed? This condition is diagnosed based on your medical history and a physical exam. Your health care provider may order imaging tests, such as an ultrasound or CT scan of your abdomen. How is this treated? Treatment for this condition depends on the cause of the ascites. It may include:  Taking a pill to make you urinate. This is called a water pill (diuretic pill).  Strictly reducing your salt (sodium) intake. Salt can cause extra fluid to be kept (retained) in the body, and this makes  ascites worse.  Having a procedure to remove fluid from your abdomen (paracentesis).  Having a procedure that connects two of the major veins within your liver and relieves pressure on your liver. This is called a TIPS procedure (transjugular  intrahepatic portosystemic shunt procedure).  Placement of a drainage catheter (peritoneovenous shunt) to manage the extra fluid in the abdomen. Ascites may go away or improve when the condition that caused it is treated. Follow these instructions at home:  Keep track of your weight. To do this, weigh yourself at the same time every day and write down your weight.  Keep track of how much you drink and any changes in how much or how often you urinate.  Follow any instructions that your health care provider gives you about how much to drink.  Try not to eat salty (high-sodium) foods.  Take over-the-counter and prescription medicines only as told by your health care provider.  Keep all follow-up visits as told by your health care provider. This is important.  Report any changes in your health to your health care provider, especially if you develop new symptoms or your symptoms get worse. Contact a health care provider if:  You gain more than 3 lb (1.36 kg) in 3 days.  Your waist size increases.  You have new swelling in your legs.  The swelling in your legs gets worse. Get help right away if:  You have a fever.  You are confused.  You have new or worsening breathing trouble.  You have new or worsening pain in your abdomen.  You have new or worsening swelling in the scrotum (in men). Summary  Ascites is a collection of too much fluid in the abdomen.  Ascites may be caused by various conditions, such as cirrhosis, hepatitis, cancer, or congestive heart failure.  Symptoms may include swelling of the abdomen and other areas due to extra fluid in the body.  Treatments may involve dietary changes, medicines, or procedures. This information is not intended to replace advice given to you by your health care provider. Make sure you discuss any questions you have with your health care provider. Document Revised: 04/28/2018 Document Reviewed: 02/06/2017 Elsevier Patient Education   2020 [employer]   Acute Kidney Injury, Adult  Acute kidney injury is a sudden worsening of kidney function. The kidneys are organs that have several jobs. They filter the blood to remove waste products and extra fluid. They also maintain a healthy balance of minerals and hormones in the body, which helps control blood pressure and keep bones strong. With this condition, your kidneys do not do their jobs as well as they should. This condition ranges from mild to severe. Over time it may develop into long-lasting (chronic) kidney disease. Early detection and treatment may prevent acute kidney injury from developing into a chronic condition. What are the causes? Common causes of this condition include:  A problem with blood flow to the kidneys. This may be caused by: ? Low blood pressure (hypotension) or shock. ? Blood loss. ? Heart and blood vessel (cardiovascular) disease. ? Severe burns. ? Liver disease.  Direct damage to the kidneys. This may be caused by: ? Certain medicines. ? A kidney infection. ? Poisoning. ? Being around or in contact with toxic substances. ? A surgical wound. ? A hard, direct hit to the kidney area.  A sudden blockage of urine flow. This may be caused by: ? Cancer. ? Kidney stones. ? An enlarged prostate in males. What are the  signs or symptoms? Symptoms of this condition may not be obvious until the condition becomes severe. Symptoms of this condition can include:  Tiredness (lethargy), or difficulty staying awake.  Nausea or vomiting.  Swelling (edema) of the face, legs, ankles, or feet.  Problems with urination, such as: ? Abdominal pain, or pain along the side of your stomach (flank). ? Decreased urine production. ? Decrease in the force of urine flow.  Muscle twitches and cramps, especially in the legs.  Confusion or trouble concentrating.  Loss of appetite.  Fever. How is this diagnosed? This condition may be diagnosed with tests,  including:  Blood tests.  Urine tests.  Imaging tests.  A test in which a sample of tissue is removed from the kidneys to be examined under a microscope (kidney biopsy). How is this treated? Treatment for this condition depends on the cause and how severe the condition is. In mild cases, treatment may not be needed. The kidneys may heal on their own. In more severe cases, treatment will involve:  Treating the cause of the kidney injury. This may involve changing any medicines you are taking or adjusting your dosage.  Fluids. You may need specialized IV fluids to balance your body's needs.  Having a catheter placed to drain urine and prevent blockages.  Preventing problems from occurring. This may mean avoiding certain medicines or procedures that can cause further injury to the kidneys. In some cases treatment may also require:  A procedure to remove toxic wastes from the body (dialysis or continuous renal replacement therapy - CRRT).  Surgery. This may be done to repair a torn kidney, or to remove the blockage from the urinary system. Follow these instructions at home: Medicines  Take over-the-counter and prescription medicines only as told by your health care provider.  Do not take any new medicines without your health care provider's approval. Many medicines can worsen your kidney damage.  Do not take any vitamin and mineral supplements without your health care provider's approval. Many nutritional supplements can worsen your kidney damage. Lifestyle  If your health care provider prescribed changes to your diet, follow them. You may need to decrease the amount of protein you eat.  Achieve and maintain a healthy weight. If you need help with this, ask your health care provider.  Start or continue an exercise plan. Try to exercise at least 30 minutes a day, 5 days a week.  Do not use any tobacco products, such as cigarettes, chewing tobacco, and e-cigarettes. If you need help  quitting, ask your health care provider. General instructions  Keep track of your blood pressure. Report changes in your blood pressure as told by your health care provider.  Stay up to date with immunizations. Ask your health care provider which immunizations you need.  Keep all follow-up visits as told by your health care provider. This is important. Where to find more information  American Association of Kidney Patients: ResidentialShow.is  [employer]: www.kidney.org  [employer]: FightingMatch.com.ee  [employer] [facility]: ? www.lifeoptions.org ? www.kidneyschool.org Contact a health care provider if:  Your symptoms get worse.  You develop new symptoms. Get help right away if:  You develop symptoms of worsening kidney disease, which include: ? Headaches. ? Abnormally dark or light skin. ? Easy bruising. ? Frequent hiccups. ? Chest pain. ? Shortness of breath. ? End of menstruation in women. ? Seizures. ? Confusion or altered mental status. ? Abdominal or back pain. ? Itchiness.  You have a  fever.  Your body is producing less urine.  You have pain or bleeding when you urinate. Summary  Acute kidney injury is a sudden worsening of kidney function.  Acute kidney injury can be caused by problems with blood flow to the kidneys, direct damage to the kidneys, and sudden blockage of urine flow.  Symptoms of this condition may not be obvious until it becomes severe. Symptoms may include edema, lethargy, confusion, nausea or vomiting, and problems passing urine.  This condition can usually be diagnosed with blood tests, urine tests, and imaging tests. Sometimes a kidney biopsy is done to diagnose this condition.  Treatment for this condition often involves treating the underlying cause. It is treated with fluids, medicines, dialysis, diet changes, or surgery. This information is not intended to replace advice given to you by your health  care provider. Make sure you discuss any questions you have with your health care provider. Document Revised: 05/09/2017 Document Reviewed: 05/17/2016 Elsevier Patient Education  2020 [employer]

## 2020-04-21 ENCOUNTER — Encounter: Payer: BC Managed Care – PPO | Admitting: Physical Therapy

## 2020-04-21 ENCOUNTER — Ambulatory Visit: Payer: BC Managed Care – PPO | Admitting: Family Medicine

## 2020-04-25 ENCOUNTER — Encounter: Payer: BC Managed Care – PPO | Admitting: Physical Therapy

## 2020-04-26 ENCOUNTER — Encounter: Payer: Self-pay | Admitting: Family Medicine

## 2020-04-26 ENCOUNTER — Telehealth: Payer: Self-pay | Admitting: Gastroenterology

## 2020-04-26 NOTE — Telephone Encounter (Signed)
Pt called stating that she woke up this morning with severe stomach pain. She was recently seen at the ED for ascites and constipation. Pt states that she has not had a bm for the past 4 days despite taking miralax. She would like some advise.

## 2020-04-26 NOTE — Telephone Encounter (Signed)
The pt states she had stomach pain last night that has subsided as of now. She was recently seen at the ED for ascites and constipation. Pt states that she has not had a bm for the past 4 days despite taking miralax. She would like some advise. She has not been seen in the office since colon on 01/06/2019.  She was in the hospital and discharged on 11/11.  She was advised to follow up with PCP and hepatology as requested in the discharge summary.  She has also been scheduled for an appt with Dr Christella Hartigan on 05/17/20.  She is aware to go to the ED if she develops severe pain again.   She will keep appt as planned

## 2020-04-27 ENCOUNTER — Encounter: Payer: BC Managed Care – PPO | Admitting: Physical Therapy

## 2020-04-27 ENCOUNTER — Encounter: Payer: Self-pay | Admitting: Family Medicine

## 2020-04-27 ENCOUNTER — Ambulatory Visit: Payer: BC Managed Care – PPO | Admitting: Family Medicine

## 2020-04-27 DIAGNOSIS — R338 Other retention of urine: Secondary | ICD-10-CM | POA: Diagnosis not present

## 2020-04-30 ENCOUNTER — Encounter: Payer: Self-pay | Admitting: Family Medicine

## 2020-05-01 NOTE — Telephone Encounter (Signed)
Pt is calling in to check the status of the Rx Oxycodone.

## 2020-05-02 ENCOUNTER — Encounter: Payer: BC Managed Care – PPO | Admitting: Physical Therapy

## 2020-05-02 ENCOUNTER — Encounter: Payer: Self-pay | Admitting: Family Medicine

## 2020-05-02 ENCOUNTER — Other Ambulatory Visit: Payer: Self-pay | Admitting: Family Medicine

## 2020-05-02 MED ORDER — OXYCODONE HCL 5 MG PO TABS: 5.0000 mg | ORAL_TABLET | Freq: Two times a day (BID) | ORAL | 0 refills | Status: DC | PRN

## 2020-05-03 DIAGNOSIS — B181 Chronic viral hepatitis B without delta-agent: Secondary | ICD-10-CM | POA: Diagnosis not present

## 2020-05-03 DIAGNOSIS — R749 Abnormal serum enzyme level, unspecified: Secondary | ICD-10-CM | POA: Diagnosis not present

## 2020-05-03 DIAGNOSIS — K7401 Hepatic fibrosis, early fibrosis: Secondary | ICD-10-CM | POA: Diagnosis not present

## 2020-05-03 DIAGNOSIS — K766 Portal hypertension: Secondary | ICD-10-CM | POA: Diagnosis not present

## 2020-05-03 DIAGNOSIS — R188 Other ascites: Secondary | ICD-10-CM | POA: Diagnosis not present

## 2020-05-03 NOTE — Telephone Encounter (Signed)
Addressed yesterday.

## 2020-05-07 ENCOUNTER — Encounter: Payer: Self-pay | Admitting: Family Medicine

## 2020-05-07 ENCOUNTER — Emergency Department (HOSPITAL_COMMUNITY): Payer: BC Managed Care – PPO | Admitting: Certified Registered Nurse Anesthetist

## 2020-05-07 ENCOUNTER — Inpatient Hospital Stay (HOSPITAL_COMMUNITY)
Admission: EM | Admit: 2020-05-07 | Discharge: 2020-05-12 | DRG: 357 | Disposition: A | Discharge status: Home / Self Care | Payer: BC Managed Care – PPO | Attending: Surgery | Admitting: Surgery

## 2020-05-07 ENCOUNTER — Emergency Department (HOSPITAL_COMMUNITY): Payer: BC Managed Care – PPO

## 2020-05-07 ENCOUNTER — Other Ambulatory Visit: Payer: Self-pay

## 2020-05-07 ENCOUNTER — Encounter (HOSPITAL_COMMUNITY): Payer: Self-pay

## 2020-05-07 ENCOUNTER — Encounter (HOSPITAL_COMMUNITY): Admission: EM | Disposition: A | Discharge status: Home / Self Care | Payer: Self-pay | Source: Home / Self Care

## 2020-05-07 DIAGNOSIS — K769 Liver disease, unspecified: Secondary | ICD-10-CM | POA: Diagnosis not present

## 2020-05-07 DIAGNOSIS — Z8632 Personal history of gestational diabetes: Secondary | ICD-10-CM

## 2020-05-07 DIAGNOSIS — K760 Fatty (change of) liver, not elsewhere classified: Secondary | ICD-10-CM

## 2020-05-07 DIAGNOSIS — K668 Other specified disorders of peritoneum: Secondary | ICD-10-CM | POA: Diagnosis not present

## 2020-05-07 DIAGNOSIS — Z8659 Personal history of other mental and behavioral disorders: Secondary | ICD-10-CM

## 2020-05-07 DIAGNOSIS — R935 Abnormal findings on diagnostic imaging of other abdominal regions, including retroperitoneum: Secondary | ICD-10-CM | POA: Diagnosis not present

## 2020-05-07 DIAGNOSIS — K2950 Unspecified chronic gastritis without bleeding: Secondary | ICD-10-CM | POA: Diagnosis not present

## 2020-05-07 DIAGNOSIS — G8929 Other chronic pain: Secondary | ICD-10-CM | POA: Diagnosis not present

## 2020-05-07 DIAGNOSIS — K5909 Other constipation: Secondary | ICD-10-CM

## 2020-05-07 DIAGNOSIS — F1010 Alcohol abuse, uncomplicated: Secondary | ICD-10-CM | POA: Diagnosis present

## 2020-05-07 DIAGNOSIS — K660 Peritoneal adhesions (postprocedural) (postinfection): Secondary | ICD-10-CM | POA: Diagnosis not present

## 2020-05-07 DIAGNOSIS — K2920 Alcoholic gastritis without bleeding: Secondary | ICD-10-CM | POA: Diagnosis not present

## 2020-05-07 DIAGNOSIS — K631 Perforation of intestine (nontraumatic): Secondary | ICD-10-CM | POA: Diagnosis not present

## 2020-05-07 DIAGNOSIS — Z87891 Personal history of nicotine dependence: Secondary | ICD-10-CM | POA: Diagnosis not present

## 2020-05-07 DIAGNOSIS — B181 Chronic viral hepatitis B without delta-agent: Secondary | ICD-10-CM | POA: Diagnosis present

## 2020-05-07 DIAGNOSIS — R188 Other ascites: Secondary | ICD-10-CM | POA: Diagnosis present

## 2020-05-07 DIAGNOSIS — R194 Change in bowel habit: Secondary | ICD-10-CM

## 2020-05-07 DIAGNOSIS — Z20822 Contact with and (suspected) exposure to covid-19: Secondary | ICD-10-CM | POA: Diagnosis not present

## 2020-05-07 DIAGNOSIS — M545 Low back pain, unspecified: Secondary | ICD-10-CM | POA: Diagnosis present

## 2020-05-07 DIAGNOSIS — K255 Chronic or unspecified gastric ulcer with perforation: Secondary | ICD-10-CM

## 2020-05-07 DIAGNOSIS — Z79899 Other long term (current) drug therapy: Secondary | ICD-10-CM | POA: Diagnosis not present

## 2020-05-07 DIAGNOSIS — K7460 Unspecified cirrhosis of liver: Secondary | ICD-10-CM | POA: Diagnosis not present

## 2020-05-07 DIAGNOSIS — K3189 Other diseases of stomach and duodenum: Secondary | ICD-10-CM | POA: Diagnosis not present

## 2020-05-07 DIAGNOSIS — R339 Retention of urine, unspecified: Secondary | ICD-10-CM | POA: Diagnosis present

## 2020-05-07 DIAGNOSIS — Z4682 Encounter for fitting and adjustment of non-vascular catheter: Secondary | ICD-10-CM | POA: Diagnosis not present

## 2020-05-07 DIAGNOSIS — Z978 Presence of other specified devices: Secondary | ICD-10-CM

## 2020-05-07 DIAGNOSIS — Z9151 Personal history of suicidal behavior: Secondary | ICD-10-CM

## 2020-05-07 DIAGNOSIS — I10 Essential (primary) hypertension: Secondary | ICD-10-CM | POA: Diagnosis not present

## 2020-05-07 DIAGNOSIS — Z452 Encounter for adjustment and management of vascular access device: Secondary | ICD-10-CM | POA: Diagnosis not present

## 2020-05-07 DIAGNOSIS — K602 Anal fissure, unspecified: Secondary | ICD-10-CM

## 2020-05-07 DIAGNOSIS — Z87440 Personal history of urinary (tract) infections: Secondary | ICD-10-CM | POA: Diagnosis not present

## 2020-05-07 DIAGNOSIS — R1033 Periumbilical pain: Secondary | ICD-10-CM

## 2020-05-07 DIAGNOSIS — Z87898 Personal history of other specified conditions: Secondary | ICD-10-CM | POA: Diagnosis not present

## 2020-05-07 DIAGNOSIS — K766 Portal hypertension: Secondary | ICD-10-CM | POA: Diagnosis not present

## 2020-05-07 DIAGNOSIS — J90 Pleural effusion, not elsewhere classified: Secondary | ICD-10-CM | POA: Diagnosis not present

## 2020-05-07 DIAGNOSIS — R933 Abnormal findings on diagnostic imaging of other parts of digestive tract: Secondary | ICD-10-CM | POA: Diagnosis not present

## 2020-05-07 DIAGNOSIS — R1084 Generalized abdominal pain: Secondary | ICD-10-CM | POA: Diagnosis not present

## 2020-05-07 DIAGNOSIS — R1030 Lower abdominal pain, unspecified: Secondary | ICD-10-CM | POA: Diagnosis not present

## 2020-05-07 DIAGNOSIS — E119 Type 2 diabetes mellitus without complications: Secondary | ICD-10-CM | POA: Diagnosis not present

## 2020-05-07 DIAGNOSIS — Z91041 Radiographic dye allergy status: Secondary | ICD-10-CM | POA: Diagnosis not present

## 2020-05-07 DIAGNOSIS — Z4659 Encounter for fitting and adjustment of other gastrointestinal appliance and device: Secondary | ICD-10-CM

## 2020-05-07 DIAGNOSIS — F418 Other specified anxiety disorders: Secondary | ICD-10-CM | POA: Diagnosis not present

## 2020-05-07 DIAGNOSIS — K625 Hemorrhage of anus and rectum: Secondary | ICD-10-CM | POA: Insufficient documentation

## 2020-05-07 HISTORY — DX: Change in bowel habit: R19.4

## 2020-05-07 HISTORY — PX: DIAGNOSTIC LAPAROSCOPIC LIVER BIOPSY: SHX5797

## 2020-05-07 HISTORY — DX: Personal history of gestational diabetes: Z86.32

## 2020-05-07 HISTORY — DX: Fatty (change of) liver, not elsewhere classified: K76.0

## 2020-05-07 HISTORY — DX: Anal fissure, unspecified: K60.2

## 2020-05-07 HISTORY — DX: Other specified disorders of peritoneum: K66.8

## 2020-05-07 HISTORY — DX: Periumbilical pain: R10.33

## 2020-05-07 HISTORY — DX: Personal history of other mental and behavioral disorders: Z86.59

## 2020-05-07 LAB — CBC WITH DIFFERENTIAL/PLATELET
Abs Immature Granulocytes: 0.02 10*3/uL (ref 0.00–0.07)
Basophils Absolute: 0.1 10*3/uL (ref 0.0–0.1)
Basophils Relative: 1 %
Eosinophils Absolute: 0.1 10*3/uL (ref 0.0–0.5)
Eosinophils Relative: 1 %
HCT: 37.7 % (ref 36.0–46.0)
Hemoglobin: 12.7 g/dL (ref 12.0–15.0)
Immature Granulocytes: 0 %
Lymphocytes Relative: 23 %
Lymphs Abs: 1.9 10*3/uL (ref 0.7–4.0)
MCH: 30.2 pg (ref 26.0–34.0)
MCHC: 33.7 g/dL (ref 30.0–36.0)
MCV: 89.8 fL (ref 80.0–100.0)
Monocytes Absolute: 0.6 10*3/uL (ref 0.1–1.0)
Monocytes Relative: 7 %
Neutro Abs: 5.5 10*3/uL (ref 1.7–7.7)
Neutrophils Relative %: 68 %
Platelets: 249 10*3/uL (ref 150–400)
RBC: 4.2 MIL/uL (ref 3.87–5.11)
RDW: 11.8 % (ref 11.5–15.5)
WBC: 8.1 10*3/uL (ref 4.0–10.5)
nRBC: 0 % (ref 0.0–0.2)

## 2020-05-07 LAB — URINALYSIS, ROUTINE W REFLEX MICROSCOPIC
Bacteria, UA: NONE SEEN
Bilirubin Urine: NEGATIVE
Glucose, UA: NEGATIVE mg/dL
Hgb urine dipstick: NEGATIVE
Ketones, ur: NEGATIVE mg/dL
Leukocytes,Ua: NEGATIVE
Nitrite: NEGATIVE
Protein, ur: >=300 mg/dL — AB
Specific Gravity, Urine: 1.016 (ref 1.005–1.030)
pH: 6 (ref 5.0–8.0)

## 2020-05-07 LAB — COMPREHENSIVE METABOLIC PANEL
ALT: 38 U/L (ref 0–44)
AST: 30 U/L (ref 15–41)
Albumin: 4.5 g/dL (ref 3.5–5.0)
Alkaline Phosphatase: 54 U/L (ref 38–126)
Anion gap: 10 (ref 5–15)
BUN: 16 mg/dL (ref 6–20)
CO2: 24 mmol/L (ref 22–32)
Calcium: 9 mg/dL (ref 8.9–10.3)
Chloride: 101 mmol/L (ref 98–111)
Creatinine, Ser: 0.92 mg/dL (ref 0.44–1.00)
GFR, Estimated: >60 mL/min (ref >60)
Glucose, Bld: 92 mg/dL (ref 70–99)
Potassium: 3.6 mmol/L (ref 3.5–5.1)
Sodium: 135 mmol/L (ref 135–145)
Total Bilirubin: 0.9 mg/dL (ref 0.3–1.2)
Total Protein: 8.5 g/dL — ABNORMAL HIGH (ref 6.5–8.1)

## 2020-05-07 LAB — AMMONIA: Ammonia: 28 umol/L (ref 9–35)

## 2020-05-07 LAB — CBC
HCT: 40.8 % (ref 36.0–46.0)
Hemoglobin: 14 g/dL (ref 12.0–15.0)
MCH: 31 pg (ref 26.0–34.0)
MCHC: 34.3 g/dL (ref 30.0–36.0)
MCV: 90.5 fL (ref 80.0–100.0)
Platelets: 276 10*3/uL (ref 150–400)
RBC: 4.51 MIL/uL (ref 3.87–5.11)
RDW: 11.7 % (ref 11.5–15.5)
WBC: 7.9 10*3/uL (ref 4.0–10.5)
nRBC: 0 % (ref 0.0–0.2)

## 2020-05-07 LAB — LACTIC ACID, PLASMA: Lactic Acid, Venous: 1 mmol/L (ref 0.5–1.9)

## 2020-05-07 LAB — PROTIME-INR
INR: 1 (ref 0.8–1.2)
Prothrombin Time: 12.8 seconds (ref 11.4–15.2)

## 2020-05-07 LAB — ETHANOL: Alcohol, Ethyl (B): <10 mg/dL (ref <10)

## 2020-05-07 LAB — RESP PANEL BY RT-PCR (FLU A&B, COVID) ARPGX2
Influenza A by PCR: NEGATIVE
Influenza B by PCR: NEGATIVE
SARS Coronavirus 2 by RT PCR: NEGATIVE

## 2020-05-07 LAB — I-STAT BETA HCG BLOOD, ED (MC, WL, AP ONLY): I-stat hCG, quantitative: <5 m[IU]/mL (ref <5)

## 2020-05-07 LAB — LIPASE, BLOOD: Lipase: 37 U/L (ref 11–51)

## 2020-05-07 SURGERY — BIOPSY, LIVER, LAPAROSCOPIC
Anesthesia: General | Site: Abdomen

## 2020-05-07 MED ORDER — DIPHENHYDRAMINE HCL 50 MG/ML IJ SOLN: 12.5000 mg | Freq: Four times a day (QID) | INTRAMUSCULAR | Status: DC | PRN

## 2020-05-07 MED ORDER — ALUM & MAG HYDROXIDE-SIMETH 200-200-20 MG/5ML PO SUSP
30.0000 mL | Freq: Four times a day (QID) | ORAL | Status: DC | PRN
  Administered 2020-05-09: 30 mL via ORAL
  Filled 2020-05-07: qty 30

## 2020-05-07 MED ORDER — LACTATED RINGERS IV BOLUS
1000.0000 mL | Freq: Once | INTRAVENOUS | Status: AC
  Administered 2020-05-07: 1000 mL via INTRAVENOUS

## 2020-05-07 MED ORDER — BUPIVACAINE LIPOSOME 1.3 % IJ SUSP
20.0000 mL | Freq: Once | INTRAMUSCULAR | Status: AC
  Administered 2020-05-08: 20 mL
  Filled 2020-05-07: qty 20

## 2020-05-07 MED ORDER — METOCLOPRAMIDE HCL 5 MG/ML IJ SOLN: 5.0000 mg | Freq: Three times a day (TID) | INTRAMUSCULAR | Status: DC | PRN

## 2020-05-07 MED ORDER — SODIUM CHLORIDE 0.9 % IV SOLN
2.0000 g | INTRAVENOUS | Status: AC
  Administered 2020-05-08: 2 g via INTRAVENOUS
  Filled 2020-05-07 (×2): qty 2

## 2020-05-07 MED ORDER — MENTHOL 3 MG MT LOZG
1.0000 | LOZENGE | OROMUCOSAL | Status: DC | PRN
  Filled 2020-05-07: qty 9

## 2020-05-07 MED ORDER — MORPHINE SULFATE (PF) 2 MG/ML IV SOLN
2.0000 mg | Freq: Once | INTRAVENOUS | Status: AC
  Administered 2020-05-07: 2 mg via INTRAVENOUS
  Filled 2020-05-07: qty 1

## 2020-05-07 MED ORDER — CHLORHEXIDINE GLUCONATE CLOTH 2 % EX PADS: 6.0000 | MEDICATED_PAD | Freq: Once | CUTANEOUS | Status: DC

## 2020-05-07 MED ORDER — PHENOL 1.4 % MT LIQD
2.0000 | OROMUCOSAL | Status: DC | PRN
  Administered 2020-05-08: 2 via OROMUCOSAL
  Filled 2020-05-07: qty 177

## 2020-05-07 MED ORDER — ONDANSETRON HCL 4 MG/2ML IJ SOLN
4.0000 mg | Freq: Once | INTRAMUSCULAR | Status: AC
  Administered 2020-05-07: 4 mg via INTRAVENOUS
  Filled 2020-05-07: qty 2

## 2020-05-07 MED ORDER — BUPIVACAINE-EPINEPHRINE (PF) 0.25% -1:200000 IJ SOLN
INTRAMUSCULAR | Status: AC
  Filled 2020-05-07: qty 60

## 2020-05-07 MED ORDER — ACETAMINOPHEN 650 MG RE SUPP: 650.0000 mg | Freq: Four times a day (QID) | RECTAL | Status: DC | PRN

## 2020-05-07 MED ORDER — MAGIC MOUTHWASH
15.0000 mL | Freq: Four times a day (QID) | ORAL | Status: DC | PRN
  Filled 2020-05-07: qty 15

## 2020-05-07 MED ORDER — ONDANSETRON HCL 4 MG/2ML IJ SOLN
4.0000 mg | Freq: Four times a day (QID) | INTRAMUSCULAR | Status: DC | PRN
  Administered 2020-05-10 – 2020-05-11 (×3): 4 mg via INTRAVENOUS
  Filled 2020-05-07 (×3): qty 2

## 2020-05-07 MED ORDER — PIPERACILLIN-TAZOBACTAM 3.375 G IVPB 30 MIN
3.3750 g | INTRAVENOUS | Status: AC
  Administered 2020-05-07: 3.375 g via INTRAVENOUS
  Filled 2020-05-07: qty 50

## 2020-05-07 MED ORDER — BUPIVACAINE HCL 0.25 % IJ SOLN
INTRAMUSCULAR | Status: AC
  Filled 2020-05-07: qty 1

## 2020-05-07 MED ORDER — MORPHINE SULFATE (PF) 4 MG/ML IV SOLN
4.0000 mg | Freq: Once | INTRAVENOUS | Status: AC
  Administered 2020-05-07: 4 mg via INTRAVENOUS
  Filled 2020-05-07: qty 1

## 2020-05-07 MED ORDER — LIP MEDEX EX OINT
1.0000 "application " | TOPICAL_OINTMENT | Freq: Two times a day (BID) | CUTANEOUS | Status: DC
  Administered 2020-05-08 – 2020-05-11 (×8): 1 via TOPICAL
  Filled 2020-05-07: qty 7

## 2020-05-07 MED ORDER — PROCHLORPERAZINE EDISYLATE 10 MG/2ML IJ SOLN
5.0000 mg | INTRAMUSCULAR | Status: DC | PRN
  Administered 2020-05-12: 10 mg via INTRAVENOUS
  Filled 2020-05-07: qty 2

## 2020-05-07 MED ORDER — METOPROLOL TARTRATE 5 MG/5ML IV SOLN: 5.0000 mg | Freq: Four times a day (QID) | INTRAVENOUS | Status: DC | PRN

## 2020-05-07 MED ORDER — METHOCARBAMOL 1000 MG/10ML IJ SOLN
1000.0000 mg | Freq: Four times a day (QID) | INTRAVENOUS | Status: DC | PRN
  Filled 2020-05-07: qty 10

## 2020-05-07 MED ORDER — PROMETHAZINE HCL 25 MG/ML IJ SOLN
12.5000 mg | Freq: Once | INTRAMUSCULAR | Status: AC
  Administered 2020-05-07: 12.5 mg via INTRAVENOUS
  Filled 2020-05-07: qty 1

## 2020-05-07 MED ORDER — BISACODYL 10 MG RE SUPP
10.0000 mg | Freq: Every day | RECTAL | Status: DC
  Administered 2020-05-08: 10 mg via RECTAL
  Filled 2020-05-07: qty 1

## 2020-05-07 MED ORDER — SODIUM CHLORIDE 0.9 % IV SOLN
8.0000 mg | Freq: Four times a day (QID) | INTRAVENOUS | Status: DC | PRN
  Filled 2020-05-07: qty 4

## 2020-05-07 MED ORDER — PIPERACILLIN-TAZOBACTAM 3.375 G IVPB
3.3750 g | Freq: Three times a day (TID) | INTRAVENOUS | Status: DC
  Filled 2020-05-07: qty 50

## 2020-05-07 MED ORDER — PANTOPRAZOLE SODIUM 40 MG IV SOLR
40.0000 mg | Freq: Two times a day (BID) | INTRAVENOUS | Status: DC
Start: 2020-05-08 — End: 2020-05-08

## 2020-05-07 MED ORDER — SIMETHICONE 40 MG/0.6ML PO SUSP
40.0000 mg | Freq: Four times a day (QID) | ORAL | Status: DC | PRN
  Administered 2020-05-10: 40 mg via ORAL
  Filled 2020-05-07 (×3): qty 0.6

## 2020-05-07 MED ORDER — HYDROMORPHONE HCL 1 MG/ML IJ SOLN
0.5000 mg | INTRAMUSCULAR | Status: DC | PRN
  Administered 2020-05-08 – 2020-05-10 (×5): 1 mg via INTRAVENOUS
  Administered 2020-05-11: 2 mg via INTRAVENOUS
  Administered 2020-05-11 (×3): 1 mg via INTRAVENOUS
  Administered 2020-05-12: 2 mg via INTRAVENOUS
  Filled 2020-05-07 (×7): qty 1
  Filled 2020-05-07: qty 2
  Filled 2020-05-07: qty 1
  Filled 2020-05-07: qty 2

## 2020-05-07 SURGICAL SUPPLY — 37 items
APPLIER CLIP ROT 10 11.4 M/L (STAPLE)
CABLE HIGH FREQUENCY MONO STRZ (ELECTRODE) ×2 IMPLANT
CLIP APPLIE ROT 10 11.4 M/L (STAPLE) IMPLANT
COVER WAND RF STERILE (DRAPES) IMPLANT
DECANTER SPIKE VIAL GLASS SM (MISCELLANEOUS) ×2 IMPLANT
DRAPE LAPAROSCOPIC ABDOMINAL (DRAPES) ×2 IMPLANT
DRAPE UTILITY XL STRL (DRAPES) ×2 IMPLANT
DRSG TEGADERM 2-3/8X2-3/4 SM (GAUZE/BANDAGES/DRESSINGS) ×6 IMPLANT
DRSG TEGADERM 4X4.75 (GAUZE/BANDAGES/DRESSINGS) ×2 IMPLANT
ELECT REM PT RETURN 15FT ADLT (MISCELLANEOUS) ×2 IMPLANT
ENDOLOOP SUT PDS II  0 18 (SUTURE)
ENDOLOOP SUT PDS II 0 18 (SUTURE) IMPLANT
GAUZE SPONGE 2X2 8PLY STRL LF (GAUZE/BANDAGES/DRESSINGS) ×1 IMPLANT
GLOVE ECLIPSE 8.0 STRL XLNG CF (GLOVE) ×2 IMPLANT
GLOVE INDICATOR 8.0 STRL GRN (GLOVE) ×2 IMPLANT
GOWN STRL REUS W/TWL XL LVL3 (GOWN DISPOSABLE) ×6 IMPLANT
IRRIG SUCT STRYKERFLOW 2 WTIP (MISCELLANEOUS) ×2
IRRIGATION SUCT STRKRFLW 2 WTP (MISCELLANEOUS) ×1 IMPLANT
KIT BASIN OR (CUSTOM PROCEDURE TRAY) ×2 IMPLANT
KIT TURNOVER KIT A (KITS) IMPLANT
NEEDLE BIOPSY 14GX4.5 SOFT TIS (NEEDLE) IMPLANT
NEEDLE BIOPSY 14X6 SOFT TISS (NEEDLE) IMPLANT
PAD POSITIONING PINK XL (MISCELLANEOUS) ×2 IMPLANT
PENCIL SMOKE EVACUATOR (MISCELLANEOUS) IMPLANT
PROTECTOR NERVE ULNAR (MISCELLANEOUS) ×2 IMPLANT
SCISSORS LAP 5X35 DISP (ENDOMECHANICALS) ×2 IMPLANT
SEALER TISSUE G2 STRG ARTC 35C (ENDOMECHANICALS) ×2 IMPLANT
SET TUBE SMOKE EVAC HIGH FLOW (TUBING) ×2 IMPLANT
SLEEVE XCEL OPT CAN 5 100 (ENDOMECHANICALS) ×4 IMPLANT
SPONGE GAUZE 2X2 STER 10/PKG (GAUZE/BANDAGES/DRESSINGS) ×1
SUT MNCRL AB 4-0 PS2 18 (SUTURE) ×2 IMPLANT
TOWEL OR 17X26 10 PK STRL BLUE (TOWEL DISPOSABLE) ×2 IMPLANT
TOWEL OR NON WOVEN STRL DISP B (DISPOSABLE) ×2 IMPLANT
TRAY FOLEY MTR SLVR 16FR STAT (SET/KITS/TRAYS/PACK) IMPLANT
TRAY LAPAROSCOPIC (CUSTOM PROCEDURE TRAY) ×2 IMPLANT
TROCAR BLADELESS OPT 5 100 (ENDOMECHANICALS) ×6 IMPLANT
TROCAR XCEL NON-BLD 11X100MML (ENDOMECHANICALS) IMPLANT

## 2020-05-07 NOTE — H&P (Addendum)
Kim Pacheco  [birth year] [id number]  CARE TEAM:  PCP: Rachael Fee, MD  Outpatient Care Team: Patient Care Team: Rachael Fee, MD as PCP - General (Gastroenterology) Sedalia Muta, PT as Physical Therapist (Physical Therapy) Rachael Fee, MD as Attending Physician (Gastroenterology) Olivia Mackie, MD as Consulting Physician (Obstetrics and Gynecology) Annamarie Major, CRNP as Nurse Practitioner (Gastroenterology)  Inpatient Treatment Team: Treatment Team: Attending Provider: Arby Barrette, MD; Physician Assistant: Norman Clay; Registered Nurse: Lorain Childes, RN; Consulting Physician: Bishop Limbo, MD; Attending Physician: Rachael Fee, MD; Rounding Team: Arlean Hopping, MD; Technician: Elouise Munroe, NT   This patient is a 34 y.o.female who presents today for surgical evaluation at the request of Lyndel Safe, [place].   Chief complaint / Reason for evaluation: Free Air  Young woman followed by [facility]  History hepatitis B.  Sounds like since birth.  Was on antiviral therapies but paused when she was pregnant.  Intermittent alcohol use.  Initially cared by Dr. Kinnie Scales but was released from the practice.  She has has consulted with [facility] here as well.  She struggles with chronic abdominal pain & constipation.  She had upper endoscopy which was rather underwhelming last year -biopsy showed some chronic inactive gastritis.  Helicobacter pylori negative.  No major irritation.  Had follow-up colonoscopy that was underwhelming by Dr. Christella Hartigan.  She struggled with urinary retention and constipation March 2020.  Underwent laparoscopy 08/2018 by gynecology given elevated CA-125.  All I found more few mild adhesions to her C-section.  No other surprises.  She had urinary retention issues back then.  She was admitted last month in renal failure with ascites.  Work-up concerning for possible portal hypertension  but no major cirrhosis, only mild fibrosis, noted on liver biopsy.She was rehydrated and improved.  She had issues urine retention so Foley catheter placed.  Not able to urinate yet.  History of moderate constipation moving her bowels every 3 or 4 days.  Had been on MiraLAX.  Recent complaint of crampy abdominal pain.  Recommendation made to start lactulose.  She felt worsening pain discomfort and came to emergency room at [facility] Sunday night.  No obvious shock or loose attacks ptosis but given persistent abdominal pain CT scan done.  That reveals free air in upper abdomen with probable disruption of proximal stomach suspicious suspicious for gastric perforation.  She is adopted so does not know her family history believes her biologic parents were from [place].  At least fatty change steatosis along with chronic hepatitis B infection.  History of some intermittent binge alcohol drinking but claims she only occasionally drinks wine 1-2x week, much less.  She had an underwhelming Thanksgiving dinner.  No IV alcohol use.  No food changes.  No analysis been sick.  No history of retching or hematemesis.  She does not aware of any gastroparesis or major heartburn or reflux.      Assessment  Kim Pacheco  34 y.o. female  Day of Surgery  Procedure(s): DIAGNOSTIC LAPAROSCOPIC  Problem List:  Principal Problem:   Pneumoperitoneum Active Problems:   Chronic hepatitis B with cirrhosis (HCC)   Constipation, chronic   Ascites   Hepatic steatosis   Foley catheter in place   Chronic gastritis   Pneumoperitoneum of uncertain etiology favoring gastric location and young woman with chronic hepatitis B and probable early cirrhosis  Plan:  IV antibiotics.  Nausea pain control.  Diagnostic laparoscopy  possible laparotomy.  Look for perforated ulcer.  Try omental patching versus stapling.  Rule out other possible etiologies.  Very odd atypical picture.  Proximal fundal perforations are very  uncommon usually only in people severely debilitated with severe gastroparesis which this woman does not seem to definitely have.  She is not in shock yet but clearly has abdominal discomfort with concerning CAT scan finding.  The anatomy & physiology of the digestive tract was discussed.  The pathophysiology of perforation was discussed.  Differential diagnosis such as perforated ulcer or colon, etc was discussed.   Natural history risks without surgery such as death was discussed.  I recommended abdominal exploration to diagnose & treat the source of the problem.  Laparoscopic & open techniques were discussed.   Risks such as bleeding, infection, abscess, leak, reoperation, bowel resection, possible ostomy, injury to other organs, need for repair of tissues / organs, hernia, heart attack, death, and other risks were discussed.   The risks of no intervention will lead to serious problems including death.   I expressed a good likelihood that surgery will address the problem.    Goals of post-operative recovery were discussed as well.  We will work to minimize complications although risks in an emergent setting are high.   Questions were answered.  The patient expressed understanding & wishes to proceed with surgery.       -Acid blockade.  IV PPI for now -Nasogastric tube  History urinary retention with Foley in place of uncertain etiology.  [facility] and failed voiding trial.  Perhaps can revisit this hospitalization.  -VTE prophylaxis- SCDs, etc -mobilize as tolerated to help recovery  45 minutes spent in review, evaluation, examination, counseling, and coordination of care.  More than 50% of that time was spent in counseling.  Ardeth Sportsman, MD, FACS, MASCRS Gastrointestinal and Minimally Invasive Surgery  [facility] [address] [place], [place] [address] [phone] Fax [phone] Main/Paging  CONTACT INFORMATION: Weekday  (9AM-5PM) concerns: Call [facility] main office at [phone] Weeknight (5PM-9AM) or Weekend/Holiday concerns: Check [email or web address] for General Surgery [facility] coverage (Please, do not use SecureChat as it is not reliable communication to operating surgeons for immediate patient care)      05/07/2020      Past Medical History:  Diagnosis Date  . Abdominal pain    started in mid abd and spread to left side/on and off/since Jan 2020/ pt states, she has fluid in abd/unknown  . Abnormal Pap smear    normal 06/2013  . Anxiety   . Constipation    hx of  . Depression    suicide attempt in college  . Diabetes mellitus without complication (HCC)    gestational, no issues since pregnancy per her report - resolved  . Gestational diabetes    /no meds now - resolved  . Hepatitis    Hep B, managed by Dr. Kinnie Scales  . History of chicken pox   . History of UTI   . Vaginal Pap smear, abnormal     Past Surgical History:  Procedure Laterality Date  . CESAREAN SECTION N/A 01/26/2013   Procedure: Primary Cesarean Section Delivery Baby  Boy @ 0017, Apgars 4/8/9;  Surgeon: Freddrick March. Tenny Craw, MD;  Location: [facility];  Service: Obstetrics;  Laterality: N/A;  . CESAREAN SECTION N/A 11/18/2017   Procedure: CESAREAN SECTION;  Surgeon: Olivia Mackie, MD;  Location: [facility] BIRTHING SUITES;  Service: Obstetrics;  Laterality: N/A;  . COLONOSCOPY  2020  . ESOPHAGOGASTRODUODENOSCOPY ENDOSCOPY    . IR TRANSCATHETER BX  04/18/2020  . IR US GUIDE VASC ACCESS RIGHT  04/18/2020  . IR VENOGRAM HEPATIC W HEMODYNAMIC EVALUATION  04/18/2020  . laproscopy  2020  . NASAL SINUS SURGERY  1996  . WISDOM TOOTH EXTRACTION      Social History   Socioeconomic History  . Marital status: Married    Spouse name: Not on file  . Number of children: 1  . Years of education: college  . Highest education level: Not on file  Occupational History  . Occupation: Environmental manager  Tobacco Use  . Smoking status: Former Smoker    Packs/day: 1.00     Types: Cigarettes    Quit date: 07/23/2011    Years since quitting: 8.7  . Smokeless tobacco: Never Used  . Tobacco comment: smoked for 8 years, quit in 2013  Vaping Use  . Vaping Use: Never used  Substance and Sexual Activity  . Alcohol use: Yes    Alcohol/week: 5.0 standard drinks    Types: 5 Glasses of wine per week  . Drug use: Not Currently    Types: Marijuana    Comment: Last use 12/19/18  . Sexual activity: Yes  Other Topics Concern  . 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 Difficulty of Paying Living Expenses: Not on file  Food Insecurity:   . Worried About Programme researcher, broadcasting/film/video in the Last Year: Not on file  . Ran Out of Food in the Last Year: Not on file  Transportation Needs:   . Lack of Transportation (Medical): Not on file  . Lack of Transportation (Non-Medical): Not on file  Physical Activity:   . Days of Exercise per Week: Not on file  . Minutes of Exercise per Session: Not on file  Stress:   . Feeling of Stress : Not on file  Social Connections:   . Frequency of Communication with Friends and Family: Not on file  . Frequency of Social Gatherings with Friends and Family: Not on file  . Attends Religious Services: Not on file  . Active Member of Clubs or Organizations: Not on file  . Attends Banker Meetings: Not on file  . Marital Status: Not on file  Intimate Partner Violence:   . Fear of Current or Ex-Partner: Not on file  . Emotionally Abused: Not on file  . Physically Abused: Not on file  . Sexually Abused: Not on file    Family History  Adopted: Yes    Current Facility-Administered Medications  Medication Dose Route Frequency Provider Last Rate Last Admin  . [START  ON 05/08/2020] cefoTEtan (CEFOTAN) 2 g in sodium chloride 0.9 % 100 mL IVPB  2 g Intravenous On Call to OR Karie Soda, MD      . Chlorhexidine Gluconate Cloth 2 % PADS 6 each  6 each Topical Once Karie Soda, MD      . piperacillin-tazobactam (ZOSYN) IVPB 3.375 g  3.375 g Intravenous STAT Poindexter, Leann T, RPH 100 mL/hr at 05/07/20 2253 3.375 g at 05/07/20 2253  . [START ON 05/08/2020] piperacillin-tazobactam (ZOSYN) IVPB 3.375 g  3.375 g Intravenous Q8H Poindexter, Leann T, RPH       Current Outpatient Medications  Medication Sig Dispense Refill  . docusate sodium (COLACE) 100 MG capsule Take 1 capsule (100 mg total) by mouth 2 (two) times daily. 30 capsule 0  . furosemide (LASIX) 20 MG tablet Take 1 tablet (20 mg total) by mouth daily. 30 tablet 0  . oxyCODONE (OXY IR/ROXICODONE) 5 MG immediate release tablet Take 1 tablet (5 mg total) by mouth every 12 (twelve) hours as needed for severe pain. 10 tablet 0  . spironolactone (ALDACTONE) 25 MG tablet Take 1 tablet (25 mg total) by mouth daily. 30 tablet 0  . ondansetron (ZOFRAN ODT) 4 MG disintegrating tablet Take 1 tablet (4 mg total) by mouth every 8 (eight) hours as needed for nausea or vomiting. 30 tablet 0  . polyethylene glycol (GOLYTELY) 236 g solution Drink 8 oz solution every 1-2 hours as needed for constipation 4000 mL 0     Allergies  Allergen Reactions  . Contrast Media [Iodinated Diagnostic Agents] Anaphylaxis, Shortness Of Breath and Rash    IV contrast     ROS:   All other systems reviewed & are negative except per HPI or as noted below: Constitutional:  No fevers, chills, sweats.  Weight stable Eyes:  No vision changes, No discharge HENT:  No sore throats, nasal drainage Lymph: No neck swelling, No bruising easily Pulmonary:  No cough, productive sputum CV: No orthopnea, PND  Patient walks 20 minutes for about 1/2 miles without difficulty.  No exertional chest/neck/shoulder/arm pain. GI:  No personal nor family  history of GI/colon cancer, inflammatory bowel disease, irritable bowel syndrome, allergy such as Celiac Sprue, dietary/dairy problems, colitis, ulcers.  No recent sick contacts/gastroenteritis.  No travel outside the country.  No changes in diet. Renal: No UTIs, No hematuria Genital:  No drainage, bleeding, masses Musculoskeletal: No severe joint pain.  Good ROM major joints Skin:  No sores or lesions.  No rashes Heme/Lymph:  No easy bleeding.  No swollen lymph nodes Neuro: No focal weakness/numbness.  No seizures Psych: No suicidal ideation.  No hallucinations  BP 100/60   Pulse 73   Temp 98.2 F (36.8 C) (Oral)   Resp (!) 21   LMP 04/23/2020 (Approximate)   SpO2 99%   Physical Exam: Constitutional: Not cachectic.  Hygeine adequate.  Vitals signs as above.  Calm but uncomfortable. Eyes: Pupils reactive, normal extraocular movements. Sclera nonicteric Neuro: CN II-XII intact.  No major focal sensory defects.  No major motor deficits. Lymph: No head/neck/groin lymphadenopathy Psych:  No severe agitation.  No severe anxiety.  Judgment & insight Adequate, Oriented x4, HENT: Normocephalic, Mucus membranes moist.  No thrush.   Neck: Supple, No tracheal deviation.  No obvious thyromegaly Chest: No pain to chest wall compression.  Good respiratory excursion.  No audible wheezing CV:  Pulses intact.  Regular rhythm.  No major extremity edema Abdomen:  Soft.  Mildly distended.  Tenderness at Upper abdomen greater than left upper quadrant.  Lower abdomen less tender.. No incarcerated hernias.  No hepatomegaly.  No splenomegaly Gen:  No inguinal hernias.  No inguinal lymphadenopathy.   Ext: No obvious deformity or contracture no significant edema.  No cyanosis Skin: No major subcutaneous nodules.  Warm and dry Musculoskeletal: Severe joint rigidity not present.  No obvious clubbing.  No digital petechiae.     Results:   Labs: Results for orders placed or performed during the hospital  encounter of 05/07/20 (from the  past 48 hour(s))  Urinalysis, Routine w reflex microscopic Urine, Clean Catch     Status: Abnormal   Collection Time: 05/07/20  3:29 PM  Result Value Ref Range   Color, Urine YELLOW YELLOW   APPearance CLEAR CLEAR   Specific Gravity, Urine 1.016 1.005 - 1.030   pH 6.0 5.0 - 8.0   Glucose, UA NEGATIVE NEGATIVE mg/dL   Hgb urine dipstick NEGATIVE NEGATIVE   Bilirubin Urine NEGATIVE NEGATIVE   Ketones, ur NEGATIVE NEGATIVE mg/dL   Protein, ur >=629 (A) NEGATIVE mg/dL   Nitrite NEGATIVE NEGATIVE   Leukocytes,Ua NEGATIVE NEGATIVE   RBC / HPF 6-10 0 - 5 RBC/hpf   WBC, UA 11-20 0 - 5 WBC/hpf   Bacteria, UA NONE SEEN NONE SEEN   Mucus PRESENT     Comment: Performed at [facility], [address], [place], [place] [address]  Lipase, blood     Status: None   Collection Time: 05/07/20  5:21 PM  Result Value Ref Range   Lipase 37 11 - 51 U/L    Comment: Performed at [facility], [address] [place], [place] [address]  Comprehensive metabolic panel     Status: Abnormal   Collection Time: 05/07/20  5:21 PM  Result Value Ref Range   Sodium 135 135 - 145 mmol/L   Potassium 3.6 3.5 - 5.1 mmol/L   Chloride 101 98 - 111 mmol/L   CO2 24 22 - 32 mmol/L   Glucose, Bld 92 70 - 99 mg/dL    Comment: Glucose reference range applies only to samples taken after fasting for at least 8 hours.   BUN 16 6 - 20 mg/dL   Creatinine, Ser 1.02 0.44 - 1.00 mg/dL   Calcium 9.0 8.9 - 72.5 mg/dL   Total Protein 8.5 (H) 6.5 - 8.1 g/dL   Albumin 4.5 3.5 - 5.0 g/dL   AST 30 15 - 41 U/L   ALT 38 0 - 44 U/L   Alkaline Phosphatase 54 38 - 126 U/L   Total Bilirubin 0.9 0.3 - 1.2 mg/dL   GFR, Estimated >36 >64 mL/min    Comment: (NOTE) Calculated using the CKD-EPI Creatinine Equation (2021)    Anion gap 10 5 - 15    Comment: Performed at [facility], [address], [place], [place] [address]  CBC     Status: None    Collection Time: 05/07/20  5:21 PM  Result Value Ref Range   WBC 7.9 4.0 - 10.5 K/uL   RBC 4.51 3.87 - 5.11 MIL/uL   Hemoglobin 14.0 12.0 - 15.0 g/dL   HCT 42.5 36 - 46 %   MCV 90.5 80.0 - 100.0 fL   MCH 31.0 26.0 - 34.0 pg   MCHC 34.3 30.0 - 36.0 g/dL   RDW 95.6 38.7 - 56.4 %   Platelets 276 150 - 400 K/uL   nRBC 0.0 0.0 - 0.2 %    Comment: Performed at [facility], [address], [place], [place] [address]  Protime-INR     Status: None   Collection Time: 05/07/20  5:21 PM  Result Value Ref Range   Prothrombin Time 12.8 11.4 - 15.2 seconds   INR 1.0 0.8 - 1.2    Comment: (NOTE) INR goal varies based on device and disease states. Performed at [facility], [address], [place], [place] [address]   Ethanol     Status: None   Collection Time:  05/07/20  5:21 PM  Result Value Ref Range   Alcohol, Ethyl (B) <10 <10 mg/dL    Comment: (NOTE) Lowest detectable limit for serum alcohol is 10 mg/dL.  For medical purposes only. Performed at [facility], [address], [place], [place] [address]   Resp Panel by RT-PCR (Flu A&B, Covid) Nasopharyngeal Swab     Status: None   Collection Time: 05/07/20  5:21 PM   Specimen: Nasopharyngeal Swab; Nasopharyngeal(NP) swabs in vial transport medium  Result Value Ref Range   SARS Coronavirus 2 by RT PCR NEGATIVE NEGATIVE    Comment: (NOTE) SARS-CoV-2 target nucleic acids are NOT DETECTED.  The SARS-CoV-2 RNA is generally detectable in upper respiratory specimens during the acute phase of infection. The lowest concentration of SARS-CoV-2 viral copies this assay can detect is 138 copies/mL. A negative result does not preclude SARS-Cov-2 infection and should not be used as the sole basis for treatment or other patient management decisions. A negative result may occur with  improper specimen collection/handling, submission of specimen other than nasopharyngeal swab, presence of  viral mutation(s) within the areas targeted by this assay, and inadequate number of viral copies(<138 copies/mL). A negative result must be combined with clinical observations, patient history, and epidemiological information. The expected result is Negative.  Fact Sheet for Patients:  BloggerCourse.com  Fact Sheet for Healthcare Providers:  SeriousBroker.it  This test is no t yet approved or cleared by the [place] FDA and  has been authorized for detection and/or diagnosis of SARS-CoV-2 by FDA under an Emergency Use Authorization (EUA). This EUA will remain  in effect (meaning this test can be used) for the duration of the COVID-19 declaration under Section 564(b)(1) of the Act, 21 U.S.C.section 360bbb-3(b)(1), unless the authorization is terminated  or revoked sooner.       Influenza A by PCR NEGATIVE NEGATIVE   Influenza B by PCR NEGATIVE NEGATIVE    Comment: (NOTE) The Xpert Xpress SARS-CoV-2/FLU/RSV plus assay is intended as an aid in the diagnosis of influenza from Nasopharyngeal swab specimens and should not be used as a sole basis for treatment. Nasal washings and aspirates are unacceptable for Xpert Xpress SARS-CoV-2/FLU/RSV testing.  Fact Sheet for Patients: BloggerCourse.com  Fact Sheet for Healthcare Providers: SeriousBroker.it  This test is not yet approved or cleared by the [place] FDA and has been authorized for detection and/or diagnosis of SARS-CoV-2 by FDA under an Emergency Use Authorization (EUA). This EUA will remain in effect (meaning this test can be used) for the duration of the COVID-19 declaration under Section 564(b)(1) of the Act, 21 U.S.C. section 360bbb-3(b)(1), unless the authorization is terminated or revoked.  Performed at [facility], [address], [place], [place] [address]   Ammonia     Status: None    Collection Time: 05/07/20  5:39 PM  Result Value Ref Range   Ammonia 28 9 - 35 umol/L    Comment: Performed at [facility], [address], [place], [place] [address]  I-Stat beta hCG blood, ED     Status: None   Collection Time: 05/07/20  5:48 PM  Result Value Ref Range   I-stat hCG, quantitative <5.0 <5 mIU/mL   Comment 3            Comment:   GEST. AGE      CONC.  (mIU/mL)   <=1 WEEK        5 - 50     2 WEEKS  50 - 500     3 WEEKS       100 - 10,000     4 WEEKS     1,000 - 30,000        FEMALE AND NON-PREGNANT FEMALE:     LESS THAN 5 mIU/mL   Lactic acid, plasma     Status: None   Collection Time: 05/07/20 10:20 PM  Result Value Ref Range   Lactic Acid, Venous 1.0 0.5 - 1.9 mmol/L    Comment: Performed at [facility], [address] [place], [place] [address]  CBC with Differential     Status: None   Collection Time: 05/07/20 10:20 PM  Result Value Ref Range   WBC 8.1 4.0 - 10.5 K/uL   RBC 4.20 3.87 - 5.11 MIL/uL   Hemoglobin 12.7 12.0 - 15.0 g/dL   HCT 40.1 36 - 46 %   MCV 89.8 80.0 - 100.0 fL   MCH 30.2 26.0 - 34.0 pg   MCHC 33.7 30.0 - 36.0 g/dL   RDW 02.7 25.3 - 66.4 %   Platelets 249 150 - 400 K/uL   nRBC 0.0 0.0 - 0.2 %   Neutrophils Relative % 68 %   Neutro Abs 5.5 1.7 - 7.7 K/uL   Lymphocytes Relative 23 %   Lymphs Abs 1.9 0.7 - 4.0 K/uL   Monocytes Relative 7 %   Monocytes Absolute 0.6 0.1 - 1.0 K/uL   Eosinophils Relative 1 %   Eosinophils Absolute 0.1 0.0 - 0.5 K/uL   Basophils Relative 1 %   Basophils Absolute 0.1 0.0 - 0.1 K/uL   Immature Granulocytes 0 %   Abs Immature Granulocytes 0.02 0.00 - 0.07 K/uL    Comment: Performed at [facility], [address], [place], [place] [address]    Imaging / Studies: CT Abdomen Pelvis Wo Contrast  Result Date: 05/07/2020 CLINICAL DATA:  Abdominal pain nonlocalized. EXAM: CT ABDOMEN AND PELVIS WITHOUT CONTRAST TECHNIQUE: Multidetector CT imaging  of the abdomen and pelvis was performed following the standard protocol without IV contrast. COMPARISON:  CT abdomen pelvis 04/13/2020 FINDINGS: Lower chest: No acute abnormality. Hepatobiliary: No focal liver abnormality. No gallstones, gallbladder wall thickening, or pericholecystic fluid. No biliary dilatation. Pancreas: No focal lesion. Normal pancreatic contour. No surrounding inflammatory changes. No main pancreatic ductal dilatation. Spleen: Normal in size without focal abnormality. Adrenals/Urinary Tract: No adrenal nodule bilaterally. Bilateral kidneys enhance symmetrically. No hydronephrosis. No hydroureter. The urinary bladder is decompressed with a urinary Foley catheter terminating within its lumen. Stomach/Bowel: Trace PO contrast noted within the lumen of the stomach. Gastric wall pneumatosis with several foci of free intraperitoneal gas in a linear formation along the gastric fundus. No evidence of bowel wall thickening or dilatation. Appendix appears normal. Vascular/Lymphatic: No abdominal aorta or iliac aneurysm. No abdominal, pelvic, or inguinal lymphadenopathy. Reproductive: Uterus and bilateral adnexa are unremarkable. Other: Interval decrease in now small volume simple free fluid ascites. Interval development of moderate volume free intraperitoneal gas. Musculoskeletal: Interval development of a nonspecific 0.8 cm soft tissue density within the left lateral anterior abdominal wall (2:48). Similar finding measuring 0.5 cm more superiorly (2:44). No suspicious lytic or blastic osseous lesions. No acute displaced fracture. IMPRESSION: 1. Suggestion of gastric fundus perforation with associated moderate pneumoperitoneum. 2. Interval decrease in small volume simple free fluid ascites within the pelvis. These results were called by telephone at the time of interpretation on 05/07/2020 at 10:03 pm to provider PA [facility] ,  who verbally acknowledged these results. Electronically Signed   By:  Tish Frederickson M.D.   On: 05/07/2020 22:09   CT ABDOMEN PELVIS WO CONTRAST  Result Date: 04/13/2020 CLINICAL DATA:  Acute severe nonlocalized abdominal pain with rebound tenderness. Concern for acute abdomen. Recent diagnosis of ruptured ovarian cyst. EXAM: CT ABDOMEN AND PELVIS WITHOUT CONTRAST TECHNIQUE: Multidetector CT imaging of the abdomen and pelvis was performed following the standard protocol without IV contrast. COMPARISON:  Abdominal CT and pelvic ultrasound 6 days ago 04/07/2020. CT 06/26/2018 also reviewed. FINDINGS: Lower chest: The lung bases are clear. No focal consolidation or pleural effusion Hepatobiliary: No focal liver abnormality is seen. Borderline hepatic steatosis. No gallstones, gallbladder wall thickening, or biliary dilatation. Pancreas: No ductal dilatation or inflammation. Spleen: Normal in size without focal abnormality. Adrenals/Urinary Tract: Normal adrenal glands. No renal stone or hydronephrosis. No perinephric edema. Urinary bladder is physiologically distended. There is a contour deformity of the bladder dome which may represent a bladder diverticulum, for example series 6, image 81. No bladder wall thickening. Stomach/Bowel: Bowel evaluation is limited in the absence of contrast and presence of intra-abdominal ascites. Ingested material within the stomach. No obvious gastric wall thickening. Decompressed small bowel without inflammation or obvious wall thickening. Liquid stool in the colon. No formed stool. The appendix is not well seen on the current exam. No evidence of appendicitis. Vascular/Lymphatic: Abdominal aorta is normal in caliber. Duplicated IVC. No adenopathy. Reproductive: Uterus is unremarkable. Ovaries tentatively visualized and symmetric in size. No adnexal mass. Other: Moderate to large volume of abdominal and pelvic ascites, progressed from recent exam. Ascites was also seen on January 2020 CT. The etiology is indeterminate. Ascites measures simple fluid  density. There is no hemorrhagic component. No free air. Musculoskeletal: There are no acute or suspicious osseous abnormalities. IMPRESSION: 1. Moderate to large volume of abdominal and pelvic ascites, progressed from recent exam. Cause for ascites is not identified. There is no hemorrhagic component nor free air. Consider fluid sampling. 2. Contour deformity of the bladder dome may represent a bladder diverticulum. 3. Duplicated IVC. Electronically Signed   By: Narda Rutherford M.D.   On: 04/13/2020 20:02   DG Chest 2 View  Result Date: 04/13/2020 CLINICAL DATA:  Shortness of breath EXAM: CHEST - 2 VIEW COMPARISON:  08/05/2014 FINDINGS: Cardiac shadow is within normal limits. The lungs are well aerated bilaterally. No focal infiltrate or sizable effusion is seen. No acute bony abnormality is noted. IMPRESSION: No active cardiopulmonary disease. Electronically Signed   By: Alcide Clever M.D.   On: 04/13/2020 18:44   IR Venogram Hepatic W Hemodynamic Evaluation  Result Date: 04/18/2020 CLINICAL DATA:  Hepatitis B, ascites.  Contrast allergy. EXAM: HEPATIC VENOGRAM HEPATIC AND  WEDGED PORTAL HEMODYNAMICS TRANSJUGULAR LIVER BIOPSY FLUOROSCOPY TIME:  7 minutes 42 seconds; 89 mGy PROCEDURE: The procedure, risks (including but not limited to bleeding, infection, organ damage ), benefits, and alternatives were explained to the patient. Questions regarding the procedure were encouraged and answered. The patient understands and consents to the procedure. Patency of the right IJ vein was confirmed with ultrasound with image documentation. An appropriate skin site was determined. Skin site was marked, prepped with chlorhexidine, and draped using maximum barrier technique. The region was infiltrated locally with 1% lidocaine. Intravenous Fentanyl and Versed 4mg  were administered as conscious sedation during continuous monitoring of the patient's level of consciousness and physiological / cardiorespiratory status  by the radiology RN, with a total moderate sedation time of 38 minutes.  Under real-time ultrasound guidance, the right IJ vein was accessed with a 21 gauge micropuncture needle; the needle tip within the vein was confirmed with ultrasound image documentation. . Needle was exchanged over guidewire for transitional dilator, which allowed passage of a Bentson wire into the IVC. Over this, a 5 [place] angiographic catheter was advanced and used to selectively catheterize the right hepatic vein. Confirmatory CO2 venography was performed. Hepatic venous pressures and wedged portal venous pressures were obtained through the catheter. Both demonstrated significant respiratory phasicity. Maximum hepatic venous pressure 17 mmHg, maximum wedged portal venous pressure 30 mmHg. The catheter was exchanged over the guidewire for a vascular sheath through which the guide of the transjugular core biopsy needle was advanced. Through this, multiple 18-gauge core liver biopsy samples were obtained. The sheath was removed and hemostasis achieved at the site. Patient tolerated procedure well, with no immediate complication. IMPRESSION: 1. Right hepatic venogram confirms appropriate catheter position. 2. Hepatic hemodynamics reveal 13 mmHg portosystemic venous pressure gradient (normal <=51mmHg). 3. Technically successful transjugular core liver biopsy . Electronically Signed   By: Corlis Leak M.D.   On: 04/18/2020 14:57   IR Transcatheter BX  Result Date: 04/18/2020 CLINICAL DATA:  Hepatitis B, ascites.  Contrast allergy. EXAM: HEPATIC VENOGRAM HEPATIC AND  WEDGED PORTAL HEMODYNAMICS TRANSJUGULAR LIVER BIOPSY FLUOROSCOPY TIME:  7 minutes 42 seconds; 89 mGy PROCEDURE: The procedure, risks (including but not limited to bleeding, infection, organ damage ), benefits, and alternatives were explained to the patient. Questions regarding the procedure were encouraged and answered. The patient understands and consents to the procedure. Patency  of the right IJ vein was confirmed with ultrasound with image documentation. An appropriate skin site was determined. Skin site was marked, prepped with chlorhexidine, and draped using maximum barrier technique. The region was infiltrated locally with 1% lidocaine. Intravenous Fentanyl and Versed 4mg  were administered as conscious sedation during continuous monitoring of the patient's level of consciousness and physiological / cardiorespiratory status by the radiology RN, with a total moderate sedation time of 38 minutes. Under real-time ultrasound guidance, the right IJ vein was accessed with a 21 gauge micropuncture needle; the needle tip within the vein was confirmed with ultrasound image documentation. . Needle was exchanged over guidewire for transitional dilator, which allowed passage of a Bentson wire into the IVC. Over this, a 5 [place] angiographic catheter was advanced and used to selectively catheterize the right hepatic vein. Confirmatory CO2 venography was performed. Hepatic venous pressures and wedged portal venous pressures were obtained through the catheter. Both demonstrated significant respiratory phasicity. Maximum hepatic venous pressure 17 mmHg, maximum wedged portal venous pressure 30 mmHg. The catheter was exchanged over the guidewire for a vascular sheath through which the guide of the transjugular core biopsy needle was advanced. Through this, multiple 18-gauge core liver biopsy samples were obtained. The sheath was removed and hemostasis achieved at the site. Patient tolerated procedure well, with no immediate complication. IMPRESSION: 1. Right hepatic venogram confirms appropriate catheter position. 2. Hepatic hemodynamics reveal 13 mmHg portosystemic venous pressure gradient (normal <=21mmHg). 3. Technically successful transjugular core liver biopsy . Electronically Signed   By: Corlis Leak M.D.   On: 04/18/2020 14:57   US RENAL  Result Date: 04/15/2020 CLINICAL DATA:  Acute  kidney injury EXAM: RENAL / URINARY TRACT ULTRASOUND COMPLETE COMPARISON:  CT abdomen pelvis 04/13/2020 FINDINGS: Right Kidney: Renal measurements: 9.5 x 5.7 x 5.4 cm = volume: 153 mL. Echogenicity within normal limits. No mass or hydronephrosis visualized. Left Kidney:  Renal measurements: 9.7 by 6.0 x 6.0 cm = volume: 186 mL. Echogenicity within normal limits. No mass or hydronephrosis visualized. Bladder: Not visualized. Other: Ascites. IMPRESSION: 1.  Unremarkable sonographic appearance of the bilateral kidneys. 2.  Bladder could not be definitely visualized. Electronically Signed   By: Emmaline Kluver M.D.   On: 04/15/2020 15:54   IR US Guide Vasc Access Right  Result Date: 04/18/2020 CLINICAL DATA:  Hepatitis B, ascites.  Contrast allergy. EXAM: HEPATIC VENOGRAM HEPATIC AND  WEDGED PORTAL HEMODYNAMICS TRANSJUGULAR LIVER BIOPSY FLUOROSCOPY TIME:  7 minutes 42 seconds; 89 mGy PROCEDURE: The procedure, risks (including but not limited to bleeding, infection, organ damage ), benefits, and alternatives were explained to the patient. Questions regarding the procedure were encouraged and answered. The patient understands and consents to the procedure. Patency of the right IJ vein was confirmed with ultrasound with image documentation. An appropriate skin site was determined. Skin site was marked, prepped with chlorhexidine, and draped using maximum barrier technique. The region was infiltrated locally with 1% lidocaine. Intravenous Fentanyl and Versed 4mg  were administered as conscious sedation during continuous monitoring of the patient's level of consciousness and physiological / cardiorespiratory status by the radiology RN, with a total moderate sedation time of 38 minutes. Under real-time ultrasound guidance, the right IJ vein was accessed with a 21 gauge micropuncture needle; the needle tip within the vein was confirmed with ultrasound image documentation. . Needle was exchanged over guidewire for  transitional dilator, which allowed passage of a Bentson wire into the IVC. Over this, a 5 angiographic catheter was advanced and used to selectively catheterize the right hepatic vein. Confirmatory CO2 venography was performed. Hepatic venous pressures and wedged portal venous pressures were obtained through the catheter. Both demonstrated significant respiratory phasicity. Maximum hepatic venous pressure 17 mmHg, maximum wedged portal venous pressure 30 mmHg. The catheter was exchanged over the guidewire for a vascular sheath through which the guide of the transjugular core biopsy needle was advanced. Through this, multiple 18-gauge core liver biopsy samples were obtained. The sheath was removed and hemostasis achieved at the site. Patient tolerated procedure well, with no immediate complication. IMPRESSION: 1. Right hepatic venogram confirms appropriate catheter position. 2. Hepatic hemodynamics reveal 13 mmHg portosystemic venous pressure gradient (normal <=77mmHg). 3. Technically successful transjugular core liver biopsy . Electronically Signed   By: 4m M.D.   On: 04/18/2020 14:57   DG Chest Portable 1 View  Result Date: 05/07/2020 CLINICAL DATA:  Free air on recent CT examination. EXAM: PORTABLE CHEST 1 VIEW COMPARISON:  04/15/2020 FINDINGS: Cardiac shadow is stable. The lungs are well aerated bilaterally. No focal infiltrate or sizable effusion is seen. Free air is noted primarily beneath the right hemidiaphragm similar to that seen on recent CT examination. No bony abnormality is noted. IMPRESSION: Free air within the abdomen similar to that seen on recent CT. No other focal abnormality is noted. Electronically Signed   By: 13/11/2019 M.D.   On: 05/07/2020 23:06   DG CHEST PORT 1 VIEW  Result Date: 04/15/2020 CLINICAL DATA:  Pleural effusions, recurrent ascites, chronic hepatitis-B EXAM: PORTABLE CHEST 1 VIEW COMPARISON:  04/13/2020 chest radiograph. FINDINGS: Stable  cardiomediastinal silhouette with normal heart size. No pneumothorax. Slight blunting of the costophrenic angles bilaterally, cannot exclude trace pleural effusions. No pulmonary edema. Minimal left basilar atelectasis. IMPRESSION: Slight blunting of the costophrenic angles bilaterally, cannot exclude trace pleural effusions. Minimal left basilar atelectasis. Electronically Signed   By: 13/09/2019.D.  On: 04/15/2020 17:19   DG Abd 2 Views  Result Date: 04/12/2020 CLINICAL DATA:  Abdominal pain for 5 days. EXAM: ABDOMEN - 2 VIEW COMPARISON:  Abdominal CT and pelvic ultrasound 04/07/2020, 5 days ago. FINDINGS: No free intra-abdominal air. No bowel dilatation to suggest obstruction. There is a moderate volume of stool in the ascending, transverse, and descending colon. No abnormal rectal distension. Pelvic calcifications are unchanged from prior exam and consistent with phleboliths. No radiopaque calculi. The lung bases are clear. No osseous abnormalities are seen. IMPRESSION: Normal bowel gas pattern. Moderate colonic stool burden. Electronically Signed   By: Narda Rutherford M.D.   On: 04/12/2020 15:28   ECHOCARDIOGRAM COMPLETE  Result Date: 04/14/2020    ECHOCARDIOGRAM REPORT   Patient Name:   ROSELYNNE LORTZ Date of Exam: 04/14/2020 Medical Rec #:  [id number]               Height:       60.0 in Accession #:    [id number]              Weight:       134.1 lb Date of Birth:  Jan 27, 1986               BSA:          1.575 m Patient Age:    34 years                BP:           119/86 mmHg Patient Gender: F                       HR:           81 bpm. Exam Location:  Inpatient Procedure: 2D Echo Indications:    CHF-Acute Diastolic 428.31 / I50.31  History:        Patient has no prior history of Echocardiogram examinations.                 Past medical history of hepatitis B, ascites of unclear                 etiology.  Sonographer:    Leta Jungling RDCS Referring Phys: [phone] STEPHEN K CHIU  Sonographer  Comments: Technically difficult study due to poor echo windows. Exam ended per patients request. IMPRESSIONS  1. Left ventricular ejection fraction, by estimation, is 55 to 60%. The left ventricle has normal function. The left ventricle has no regional wall motion abnormalities. Left ventricular diastolic function could not be evaluated.  2. Right ventricular systolic function is normal. The right ventricular size is normal.  3. Large pleural effusion.  4. The mitral valve is normal in structure. No evidence of mitral valve regurgitation.  5. The aortic valve is normal in structure. Aortic valve regurgitation is not visualized. FINDINGS  Left Ventricle: Left ventricular ejection fraction, by estimation, is 55 to 60%. The left ventricle has normal function. The left ventricle has no regional wall motion abnormalities. The left ventricular internal cavity size was normal in size. There is  no left ventricular hypertrophy. Left ventricular diastolic function could not be evaluated. Right Ventricle: The right ventricular size is normal. Right vetricular wall thickness was not assessed. Right ventricular systolic function is normal. Left Atrium: Left atrial size was normal in size. Right Atrium: Right atrial size was normal in size. Pericardium: There is no evidence of pericardial effusion. Mitral Valve: The mitral valve is normal in structure.  No evidence of mitral valve regurgitation. Tricuspid Valve: The tricuspid valve is normal in structure. Tricuspid valve regurgitation is trivial. Aortic Valve: The aortic valve is normal in structure. Aortic valve regurgitation is not visualized. Pulmonic Valve: The pulmonic valve was normal in structure. Pulmonic valve regurgitation is not visualized. Aorta: The aortic root is normal in size and structure. IAS/Shunts: The interatrial septum was not assessed. Additional Comments: There is a large pleural effusion.  LEFT VENTRICLE PLAX 2D LVIDd:         3.50 cm  Diastology LVIDs:          2.40 cm  LV e' medial:    5.22 cm/s LV PW:         0.80 cm  LV E/e' medial:  9.3 LV IVS:        0.70 cm  LV e' lateral:   6.42 cm/s LVOT diam:     1.80 cm  LV E/e' lateral: 7.5 LV SV:         41 LV SV Index:   26 LVOT Area:     2.54 cm  RIGHT VENTRICLE RV S prime:     8.05 cm/s LEFT ATRIUM             Index       RIGHT ATRIUM          Index LA diam:        3.10 cm 1.97 cm/m  RA Area:     7.94 cm LA Vol (A2C):   17.8 ml 11.30 ml/m RA Volume:   12.60 ml 8.00 ml/m LA Vol (A4C):   22.3 ml 14.16 ml/m LA Biplane Vol: 20.0 ml 12.70 ml/m  AORTIC VALVE LVOT Vmax:   86.80 cm/s LVOT Vmean:  56.600 cm/s LVOT VTI:    0.161 m  AORTA Ao Root diam: 2.30 cm MITRAL VALVE MV Area (PHT): 2.99 cm    SHUNTS MV Decel Time: 254 msec    Systemic VTI:  0.16 m MV E velocity: 48.40 cm/s  Systemic Diam: 1.80 cm MV A velocity: 66.80 cm/s MV E/A ratio:  0.72 Dietrich Pates MD Electronically signed by Dietrich Pates MD Signature Date/Time: 04/14/2020/7:29:20 PM    Final    US LIVER DOPPLER  Result Date: 04/16/2020 CLINICAL DATA:  Ascites, abnormal LFTs, hepatic steatosis EXAM: DUPLEX ULTRASOUND OF LIVER TECHNIQUE: Color and duplex Doppler ultrasound was performed to evaluate the hepatic in-flow and out-flow vessels. COMPARISON:  06/23/2018 CT with contrast FINDINGS: Liver: Slight increased echogenicity. Normal hepatic contour without nodularity. No focal lesion, mass or intrahepatic biliary ductal dilatation. Main Portal Vein size: 1.0 cm Portal Vein Velocities Main Prox:  15 cm/sec Main Mid: 18 cm/sec Main Dist:  13 cm/sec Right: 8.0 cm/sec Left: 8.0 cm/sec Hepatic Vein Velocities Right:  18 cm/sec Middle:  14 cm/sec Left:  6 cm/sec IVC: Present and patent with normal respiratory phasicity. Hepatic Artery Velocity:  36 cm/sec Splenic Vein Velocity:  15 cm/sec Spleen: 7.6 cm x 8.1 cm x 7.1 cm with a total volume of 229 cm^3 (411 cm^3 is upper limit normal) Portal Vein Occlusion/Thrombus: No Splenic Vein Occlusion/Thrombus: No Ascites:  Small amount of upper abdominal ascites Varices: Not visualized Patent portal, hepatic and splenic veins with normal directional flow. Negative for portal vein occlusion or thrombus. Along the gallbladder neck anterior wall, there is a echogenic slightly lobulated intraluminal abnormality or mass measuring 1.4 x 1.1 x 1.0 cm without shadowing. This remains nonspecific and may represent adherent gallbladder sludge. Consider follow-up  short-term right upper quadrant ultrasound. IMPRESSION: Normal hepatic venous Doppler. Suspect mild hepatic steatosis Perihepatic small volume of ascites 1.4 cm gallbladder neck anterior wall echogenic nodular abnormality, indeterminate but may represent adherent gallbladder sludge. Consider short-term follow-up right upper quadrant ultrasound at 3 months and if persistent consider further imaging with MRI. Electronically Signed   By: Judie PetitM.  Shick M.D.   On: 04/16/2020 12:56    Medications / Allergies: per chart  Antibiotics: Anti-infectives (From admission, onward)   Start     Dose/Rate Route Frequency Ordered Stop   05/08/20 0600  piperacillin-tazobactam (ZOSYN) IVPB 3.375 g        3.375 g 12.5 mL/hr over 240 Minutes Intravenous Every 8 hours 05/07/20 2234     05/08/20 0600  cefoTEtan (CEFOTAN) 2 g in sodium chloride 0.9 % 100 mL IVPB        2 g 200 mL/hr over 30 Minutes Intravenous On call to O.R. 05/07/20 2318 05/09/20 0559   05/07/20 2245  piperacillin-tazobactam (ZOSYN) IVPB 3.375 g        3.375 g 100 mL/hr over 30 Minutes Intravenous STAT 05/07/20 2231 05/08/20 2245        Note: Portions of this report may have been transcribed using voice recognition software. Every effort was made to ensure accuracy; however, inadvertent computerized transcription errors may be present.   Any transcriptional errors that result from this process are unintentional.    Ardeth SportsmanSteven C. Macaela Presas, MD, FACS, MASCRS Gastrointestinal and Minimally Invasive Surgery  [facility] [address] [place] [id number] [phone] Fax [phone] Main/Paging  CONTACT INFORMATION: Weekday (9AM-5PM) concerns: Call [facility] main office at [phone] Weeknight (5PM-9AM) or Weekend/Holiday concerns: Check [email or web address] for General Surgery [facility] coverage (Please, do not use SecureChat as it is not reliable communication to operating surgeons for immediate patient care)      05/07/2020  11:20 PM

## 2020-05-07 NOTE — ED Notes (Signed)
Notified PA that patient is requesting nausea medication

## 2020-05-07 NOTE — ED Provider Notes (Signed)
[facility]-EMERGENCY DEPT Provider Note   CSN: [id number] Arrival date & time: 05/07/20  1511     History Chief Complaint  Patient presents with  . Abdominal Pain    Kim Pacheco is a 34 y.o. female with past medical history of portal hypertension, depression, alcohol abuse, hepatitis B, recently admitted for acute renal failure, ascites, was admitted from 11/5 until 11/11 who presents today for evaluation of abdominal pain.  She reports that she has had worsening bilateral lower abdomen abdominal pain.  She has seen her urologist, and failed a voiding trial and had her catheter reinserted.  She is supposed to follow-up with them tomorrow.  She also has follow-up December 8 with GI.  She reports compliance with all of her medications.  Her last bowel movement was today.  She had diarrhea.  She reports that she is compliant with her lactulose which was given to her for constipation.   Her pain is in her bilateral lower abdomen.  She has been seen by her OB/GYN since this started and per her report the said that they were no OB/GYN processes that would be causing her symptoms.  She reports that when she was here last time she had pain in the same area.  She has had slightly less urinary output than usual.    Review of [place] PMP shows that on 11/23 she had requested additional pain medication from her doctor.  It appears she was given 5 days worth of narcotics.  This would have run out today.  HPI     Past Medical History:  Diagnosis Date  . Abdominal pain    started in mid abd and spread to left side/on and off/since Jan 2020/ pt states, she has fluid in abd/unknown  . Abnormal Pap smear    normal 06/2013  . Anxiety   . Constipation    hx of  . Depression    suicide attempt in college  . Diabetes mellitus without complication (HCC)    gestational, no issues since pregnancy per her report - resolved  . Gestational diabetes    /no meds now  - resolved  . Hepatitis    Hep B, managed by Dr. Kinnie Scales  . History of chicken pox   . History of UTI   . Vaginal Pap smear, abnormal     Patient Active Problem List   Diagnosis Date Noted  . History of recurrent UTI (urinary tract infection) 05/08/2020  . Hepatic steatosis 05/07/2020  . Pneumoperitoneum 05/07/2020  . Foley catheter in place 05/07/2020  . Anal fissure 05/07/2020  . Change in bowel habit 05/07/2020  . Periumbilical pain 05/07/2020  . Rectal bleeding 05/07/2020  . History of gestational diabetes 05/07/2020  . History of depression 05/07/2020  . Portal hypertension (HCC)   . History of urinary retention 04/15/2020  . Chronic abdominal pain 04/13/2020  . Narcolepsy without cataplexy 02/02/2020  . Chronic gastritis 05/08/2019  . Depression, major, single episode, moderate (HCC) 10/28/2018  . Elevated CA-125 08/06/2018  . ARF (acute renal failure) (HCC) 06/25/2018  . Alcohol abuse 06/24/2018  . Constipation, chronic 06/24/2018  . Ascites 06/24/2018  . Body mass index (BMI) of 25.0 to 29.9 05/29/2018  . Fibrocystic disease of breast 05/29/2018  . Metrorrhagia 05/29/2018  . Urinary tract infectious disease 05/29/2018  . Oral hypoglycemic controlled White classification A2 gestational diabetes mellitus (GDM) 11/18/2017  . Previous cesarean delivery, delivered: arrest of dilation  11/18/2017  . Abnormal glucose tolerance test (  GTT) during pregnancy, antepartum 09/17/2017  . Chronic hepatitis B with cirrhosis (HCC) 06/14/2014    Past Surgical History:  Procedure Laterality Date  . CESAREAN SECTION N/A 01/26/2013   Procedure: Primary Cesarean Section Delivery Baby  Boy @ 0017, Apgars 4/8/9;  Surgeon: Freddrick March. Tenny Craw, MD;  Location: [facility] ORS;  Service: Obstetrics;  Laterality: N/A;  . CESAREAN SECTION N/A 11/18/2017   Procedure: CESAREAN SECTION;  Surgeon: Olivia Mackie, MD;  Location: [facility] BIRTHING SUITES;  Service: Obstetrics;  Laterality: N/A;  . COLONOSCOPY  2020  .  DIAGNOSTIC LAPAROSCOPY  08/25/2018   Dr Clifton James with Ob/Gyn3/2020  . ESOPHAGOGASTRODUODENOSCOPY ENDOSCOPY    . IR TRANSCATHETER BX  04/18/2020  . IR US GUIDE VASC ACCESS RIGHT  04/18/2020  . IR VENOGRAM HEPATIC W HEMODYNAMIC EVALUATION  04/18/2020  . NASAL SINUS SURGERY  1996  . WISDOM TOOTH EXTRACTION       OB History    Gravida  7   Para  2   Term  2   Preterm      AB  5   Living  2     SAB  0   TAB  5   Ectopic      Multiple  0   Live Births  2           Family History  Adopted: Yes  Family history unknown: Yes    Social History   Tobacco Use  . Smoking status: Former Smoker    Packs/day: 1.00    Types: Cigarettes    Quit date: 07/23/2011    Years since quitting: 8.8  . Smokeless tobacco: Never Used  . Tobacco comment: smoked for 8 years, quit in 2013  Vaping Use  . Vaping Use: Never used  Substance Use Topics  . Alcohol use: Yes    Alcohol/week: 5.0 standard drinks    Types: 5 Glasses of wine per week    Comment: h/o binge drinking - more abstinent 2021-  . Drug use: Not Currently    Types: Marijuana    Comment: Last use 12/19/18    Home Medications Prior to Admission medications   Medication Sig Start Date End Date Taking? Authorizing Provider  docusate sodium (COLACE) 100 MG capsule Take 1 capsule (100 mg total) by mouth 2 (two) times daily. 04/12/20  Yes Koberlein, Junell C, MD  furosemide (LASIX) 20 MG tablet Take 1 tablet (20 mg total) by mouth daily. 04/20/20  Yes Gherghe, Daylene Katayama, MD  oxyCODONE (OXY IR/ROXICODONE) 5 MG immediate release tablet Take 1 tablet (5 mg total) by mouth every 12 (twelve) hours as needed for severe pain. 05/02/20  Yes Koberlein, Paris Lore, MD  spironolactone (ALDACTONE) 25 MG tablet Take 1 tablet (25 mg total) by mouth daily. 04/20/20  Yes Gherghe, Daylene Katayama, MD  ondansetron (ZOFRAN ODT) 4 MG disintegrating tablet Take 1 tablet (4 mg total) by mouth every 8 (eight) hours as needed for nausea or vomiting. 04/12/20    Wynn Banker, MD  polyethylene glycol (GOLYTELY) 236 g solution Drink 8 oz solution every 1-2 hours as needed for constipation 04/12/20   Koberlein, Paris Lore, MD    Allergies    Contrast media [iodinated diagnostic agents] and Nsaids  Review of Systems   Review of Systems  Constitutional: Negative for chills and fever.  Respiratory: Negative for cough and shortness of breath.   Cardiovascular: Negative for chest pain.  Gastrointestinal: Positive for abdominal distention, abdominal pain, diarrhea and nausea. Negative for constipation  and vomiting.  Musculoskeletal: Negative for back pain and neck pain.  Neurological: Negative for weakness and headaches.  All other systems reviewed and are negative.   Physical Exam Updated Vital Signs BP 100/60   Pulse 73   Temp 98.2 F (36.8 C) (Oral)   Resp (!) 21   LMP 04/23/2020 (Approximate)   SpO2 99%   Physical Exam Vitals and nursing note reviewed.  Constitutional:      General: She is not in acute distress.    Appearance: She is well-developed. She is not ill-appearing.  HENT:     Head: Normocephalic and atraumatic.  Eyes:     Conjunctiva/sclera: Conjunctivae normal.  Cardiovascular:     Rate and Rhythm: Normal rate and regular rhythm.     Heart sounds: No murmur heard.   Pulmonary:     Effort: Pulmonary effort is normal. No respiratory distress.     Breath sounds: Normal breath sounds.  Abdominal:     General: Bowel sounds are normal. There is distension (Minimal).     Palpations: Abdomen is soft.     Tenderness: There is abdominal tenderness in the right lower quadrant, suprapubic area and left lower quadrant. There is no guarding or rebound.  Musculoskeletal:     Cervical back: Neck supple.  Skin:    General: Skin is warm and dry.  Neurological:     General: No focal deficit present.     Mental Status: She is alert.  Psychiatric:        Mood and Affect: Mood normal.        Behavior: Behavior normal.     ED  Results / Procedures / Treatments   Labs (all labs ordered are listed, but only abnormal results are displayed) Labs Reviewed  COMPREHENSIVE METABOLIC PANEL - Abnormal; Notable for the following components:      Result Value   Total Protein 8.5 (*)    All other components within normal limits  URINALYSIS, ROUTINE W REFLEX MICROSCOPIC - Abnormal; Notable for the following components:   Protein, ur >=300 (*)    All other components within normal limits  RESP PANEL BY RT-PCR (FLU A&B, COVID) ARPGX2  URINE CULTURE  CULTURE, BLOOD (ROUTINE X 2)  CULTURE, BLOOD (ROUTINE X 2)  LIPASE, BLOOD  CBC  PROTIME-INR  ETHANOL  AMMONIA  LACTIC ACID, PLASMA  CBC WITH DIFFERENTIAL/PLATELET  CBC  BASIC METABOLIC PANEL  PREALBUMIN  PHOSPHORUS  VITAMIN B12  FOLATE  IRON AND TIBC  FERRITIN  RETICULOCYTES  CA 125  I-STAT BETA HCG BLOOD, ED ([facility], [facility], [facility] ONLY)  TYPE AND SCREEN    EKG None  Radiology CT Abdomen Pelvis Wo Contrast  Result Date: 05/07/2020 CLINICAL DATA:  Abdominal pain nonlocalized. EXAM: CT ABDOMEN AND PELVIS WITHOUT CONTRAST TECHNIQUE: Multidetector CT imaging of the abdomen and pelvis was performed following the standard protocol without IV contrast. COMPARISON:  CT abdomen pelvis 04/13/2020 FINDINGS: Lower chest: No acute abnormality. Hepatobiliary: No focal liver abnormality. No gallstones, gallbladder wall thickening, or pericholecystic fluid. No biliary dilatation. Pancreas: No focal lesion. Normal pancreatic contour. No surrounding inflammatory changes. No main pancreatic ductal dilatation. Spleen: Normal in size without focal abnormality. Adrenals/Urinary Tract: No adrenal nodule bilaterally. Bilateral kidneys enhance symmetrically. No hydronephrosis. No hydroureter. The urinary bladder is decompressed with a urinary Foley catheter terminating within its lumen. Stomach/Bowel: Trace PO contrast noted within the lumen of the stomach. Gastric wall pneumatosis with several foci of  free intraperitoneal gas in a linear formation along the  gastric fundus. No evidence of bowel wall thickening or dilatation. Appendix appears normal. Vascular/Lymphatic: No abdominal aorta or iliac aneurysm. No abdominal, pelvic, or inguinal lymphadenopathy. Reproductive: Uterus and bilateral adnexa are unremarkable. Other: Interval decrease in now small volume simple free fluid ascites. Interval development of moderate volume free intraperitoneal gas. Musculoskeletal: Interval development of a nonspecific 0.8 cm soft tissue density within the left lateral anterior abdominal wall (2:48). Similar finding measuring 0.5 cm more superiorly (2:44). No suspicious lytic or blastic osseous lesions. No acute displaced fracture. IMPRESSION: 1. Suggestion of gastric fundus perforation with associated moderate pneumoperitoneum. 2. Interval decrease in small volume simple free fluid ascites within the pelvis. These results were called by telephone at the time of interpretation on 05/07/2020 at 10:03 pm to provider PA [facility] , who verbally acknowledged these results. Electronically Signed   By: Tish Frederickson M.D.   On: 05/07/2020 22:09   DG Chest Portable 1 View  Result Date: 05/07/2020 CLINICAL DATA:  Free air on recent CT examination. EXAM: PORTABLE CHEST 1 VIEW COMPARISON:  04/15/2020 FINDINGS: Cardiac shadow is stable. The lungs are well aerated bilaterally. No focal infiltrate or sizable effusion is seen. Free air is noted primarily beneath the right hemidiaphragm similar to that seen on recent CT examination. No bony abnormality is noted. IMPRESSION: Free air within the abdomen similar to that seen on recent CT. No other focal abnormality is noted. Electronically Signed   By: Alcide Clever M.D.   On: 05/07/2020 23:06    Procedures .Critical Care Performed by: Cristina Gong, PA-C Authorized by: Cristina Gong, PA-C   Critical care provider statement:    Critical care time (minutes):   45   Critical care time was exclusive of:  Separately billable procedures and treating other patients and teaching time   Critical care was time spent personally by me on the following activities:  Discussions with consultants, evaluation of patient's response to treatment, examination of patient, ordering and performing treatments and interventions, ordering and review of laboratory studies, ordering and review of radiographic studies, pulse oximetry, re-evaluation of patient's condition, obtaining history from patient or surrogate and review of old charts   (including critical care time)  Medications Ordered in ED Medications  piperacillin-tazobactam (ZOSYN) IVPB 3.375 g ( Intravenous Automatically Held 05/16/20 2200)  Chlorhexidine Gluconate Cloth 2 % PADS 6 each (has no administration in time range)  cefoTEtan (CEFOTAN) 2 g in sodium chloride 0.9 % 100 mL IVPB (has no administration in time range)  bupivacaine liposome (EXPAREL) 1.3 % injection 266 mg (has no administration in time range)  HYDROmorphone (DILAUDID) injection 0.5-2 mg (has no administration in time range)  methocarbamol (ROBAXIN) 1,000 mg in dextrose 5 % 100 mL IVPB (has no administration in time range)  acetaminophen (TYLENOL) suppository 650 mg (has no administration in time range)  ondansetron (ZOFRAN) injection 4 mg (has no administration in time range)    Or  ondansetron (ZOFRAN) 8 mg in sodium chloride 0.9 % 50 mL IVPB (has no administration in time range)  prochlorperazine (COMPAZINE) injection 5-10 mg (has no administration in time range)  metoCLOPramide (REGLAN) injection 5-10 mg (has no administration in time range)  lip balm (CARMEX) ointment 1 application (has no administration in time range)  phenol (CHLORASEPTIC) mouth spray 2 spray (has no administration in time range)  menthol-cetylpyridinium (CEPACOL) lozenge 3 mg (has no administration in time range)  magic mouthwash (has no administration in time range)   alum & mag hydroxide-simeth (MAALOX/MYLANTA)  200-200-20 MG/5ML suspension 30 mL (has no administration in time range)  simethicone (MYLICON) 40 MG/0.6ML suspension 40 mg (has no administration in time range)  bisacodyl (DULCOLAX) suppository 10 mg (has no administration in time range)  pantoprazole (PROTONIX) injection 40 mg (has no administration in time range)  diphenhydrAMINE (BENADRYL) injection 12.5-25 mg (has no administration in time range)  metoprolol tartrate (LOPRESSOR) injection 5 mg (has no administration in time range)  morphine 2 MG/ML injection 2 mg (2 mg Intravenous Given 05/07/20 1830)  ondansetron (ZOFRAN) injection 4 mg (4 mg Intravenous Given 05/07/20 1935)  morphine 4 MG/ML injection 4 mg (4 mg Intravenous Given 05/07/20 2224)  promethazine (PHENERGAN) injection 12.5 mg (12.5 mg Intravenous Given 05/07/20 2225)  lactated ringers bolus 1,000 mL (1,000 mLs Intravenous New Bag/Given 05/07/20 2224)  piperacillin-tazobactam (ZOSYN) IVPB 3.375 g (3.375 g Intravenous New Bag/Given 05/07/20 2253)    ED Course  I have reviewed the triage vital signs and the nursing notes.  Pertinent labs & imaging results that were available during my care of the patient were reviewed by me and considered in my medical decision making (see chart for details).    MDM Rules/Calculators/A&P                         Patient is a 34 year old woman who presents today for evaluation of abdominal pain.  She was seen earlier this month with ascites and admitted to the hospital.  It appears that since then she has had abdominal pain and been taking narcotic medicine.  Her pain has been attributed to constipation, and it appears that she ran out of her narcotic medicine today.  Additionally patient has a Foley catheter in place, has thus far failed voiding trials.  Given the concern for narcotics causing constipation causing her pain initially she was managed conservatively with low-dose morphine and she  had a heating pad.  There obtained and reviewed, CBC is unremarkable.  CMP shows slightly elevated total protein however is otherwise normal.  Her ethanol is undetected.  INR is normal.  Covid, fluid, and flu B tests are all negative.  CT abdomen is obtained.  CT abdomen shows concern for gastric fundus perforation.    Zosyn is ordered.  I spoke with Dr. Michaell CowingGross of surgery who will review patient's chart, initially requested medical admit. I spoke with Dr. Adela Glimpseoutova who, in coordination with Dr. Michaell CowingGross provided medical consult with Dr. Michaell CowingGross admitting.  Patient will be taken emergently to the OR for evaluation by Dr. Michaell CowingGross.   Note: Portions of this report may have been transcribed using voice recognition software. Every effort was made to ensure accuracy; however, inadvertent computerized transcription errors may be present  Final Clinical Impression(s) / ED Diagnoses Final diagnoses:  Pneumoperitoneum  Gastric perforation (HCC)  Hepatic steatosis  Portal hypertension [facility](HCC)    Rx / DC Orders ED Discharge Orders    None       Norman ClayHammond, Theoren Palka W, PA-C 05/08/20 Marilu [id number]    Pfeiffer, Marcy, MD 05/19/20 1946

## 2020-05-07 NOTE — ED Notes (Signed)
Pt wants to wait to give blood in case she needs an IV because she has small rolling veins and does not want to be stuck multiple times.

## 2020-05-07 NOTE — ED Notes (Signed)
PA aware of patients pain.

## 2020-05-07 NOTE — Anesthesia Preprocedure Evaluation (Addendum)
Anesthesia Evaluation  Patient identified by MRN, date of birth, ID band Patient awake    Reviewed: Patient's Chart, lab work & pertinent test results  Airway Mallampati: II  TM Distance: >3 FB Neck ROM: Full    Dental  (+) Teeth Intact   Pulmonary neg pulmonary ROS, former smoker,    Pulmonary exam normal        Cardiovascular hypertension,  Rhythm:Regular Rate:Normal     Neuro/Psych Anxiety Depression    GI/Hepatic (+) Cirrhosis   ascites    , Hepatitis -, BPerforated gastric fundus   Endo/Other  diabetes, Type 2  Renal/GU ARFRenal disease  negative genitourinary   Musculoskeletal negative musculoskeletal ROS (+)   Abdominal (+)  Abdomen: soft. Bowel sounds: normal.  Peds  Hematology negative hematology ROS (+)   Anesthesia Other Findings   Reproductive/Obstetrics                            Anesthesia Physical Anesthesia Plan  ASA: III and emergent  Anesthesia Plan: General   Post-op Pain Management:    Induction: Intravenous and Rapid sequence  PONV Risk Score and Plan: 3 and Ondansetron, Dexamethasone and Treatment may vary due to age or medical condition  Airway Management Planned: Mask and Oral ETT  Additional Equipment: None  Intra-op Plan:   Post-operative Plan: Possible Post-op intubation/ventilation  Informed Consent: I have reviewed the patients History and Physical, chart, labs and discussed the procedure including the risks, benefits and alternatives for the proposed anesthesia with the patient or authorized representative who has indicated his/her understanding and acceptance.     Dental advisory given  Plan Discussed with: CRNA  Anesthesia Plan Comments: (Lab Results      Component                Value               Date                      WBC                      8.1                 05/07/2020                HGB                      12.7                 05/07/2020                HCT                      37.7                05/07/2020                MCV                      89.8                05/07/2020                PLT                      249  05/07/2020           Lab Results      Component                Value               Date                      NA                       135                 05/07/2020                K                        3.6                 05/07/2020                CO2                      24                  05/07/2020                GLUCOSE                  92                  05/07/2020                BUN                      16                  05/07/2020                CREATININE               0.92                05/07/2020                CALCIUM                  9.0                 05/07/2020                GFRNONAA                 >60                 05/07/2020                GFRAA                    >60                 06/26/2018          )        Anesthesia Quick Evaluation

## 2020-05-07 NOTE — Progress Notes (Signed)
  Pharmacy Antibiotic Note  Kim Pacheco is a 34 y.o. female admitted on 05/07/2020 with intra-abdominal infection.  Pharmacy has been consulted for Zosyn dosing.  Plan: Zosyn 3.375g IV q8h (4 hour infusion).  Need for further dosage adjustment appears unlikely at present.    Will sign off at this time.  Please reconsult if a change in clinical status warrants re-evaluation of dosage.     Temp (24hrs), Avg:98.2 F (36.8 C), Min:98.2 F (36.8 C), Max:98.2 F (36.8 C)  Recent Labs  Lab 05/07/20 1721  WBC 7.9  CREATININE 0.92    CrCl cannot be calculated (Unknown ideal weight.).    Allergies  Allergen Reactions  . Contrast Media [Iodinated Diagnostic Agents] Anaphylaxis, Shortness Of Breath and Rash    IV contrast     Thank you for allowing pharmacy to be a part of this patient's care.  Maryellen Pile, PharmD 05/07/2020 10:32 PM

## 2020-05-07 NOTE — Consult Note (Signed)
[facility] Medical Consultation  Kim Pacheco VEL:[id number] DOB: 02-07-1986 DOA: 05/07/2020 PCP: Rachael Fee, MD   Requesting physician: Michaell Cowing Date of consultation: 05/07/20   Reason for consultation: hx of Hep B, constipation, portal HTN, urinary retention  Impression/Recommendations  Principal Problem:   Pneumoperitoneum Active Problems:   Chronic hepatitis B with cirrhosis (HCC)   Constipation, chronic   Ascites   Hepatic steatosis   Foley catheter in place   Chronic gastritis   History of urinary retention   History of gestational diabetes   History of depression    Gastric perforation with pneumoperitoneum As per primary team patient is to proceed to the OR emergently tonight agree with antibiotic coverage may need to observe in stepdown postoperatively    Chronic hepatitis B -portal hypertension patient is to follow-up as an outpatient for GI Would avoid fluid overload while hospitalized For tonight hold Lasix spironolactone and lactulose Given soft blood pressures and gastric perforation If blood pressure stabilizes and evidence of fluid overload would resume once able to tolerate p.o. At this point no evidence of hepatic encephalopathy hold lactulose patient was taken that for constipation rather than encephalopathy    Alcohol abuse -patient states that she is in the process of quitting reports her last alcoholic drink was 4 days ago and was one wine glass.  At this point showing no evidence of alcohol withdrawal but continue to monitor for  Urinary retention -Foley in place patient been followed by urology, unclear etiology for chronic urinary retention perhaps use of chronic narcotics has contributed. Continue Foley for now. Would not attempt to do voiding trials while acutely ill  History of gastritis would recommend PPI already ordered  [facility] will followup again tomorrow. Please contact [facility] on call if I can be of  assistance in the meanwhile. Thank you for this consultation.   History of constipation -recently have had diarrhea appears to be when patient is complained a lot chills under good control. Patient is on chronic narcotics likely contributing to constipation Once able to tolerate p.o. and acute illness has resolved would resume regimen Would be on the look out for postoperative ileus  Chief Complaint:  Chief Complaint  Patient presents with  . Abdominal Pain   HPI:  Kim Pacheco is a 34 y.o. female with medical history significant of hepatitis B, ascites of unclear etiology, history of depression, history of constipation,  alcohol abuse,   recurrent admittions for abd pain  Presented with  Severe abd pain Started to have some pain 2 wks ago shortly after dc from the hospital on and off But Today in AM change in nature of abdominal pain to more severe Denies any N/V No trauma  Her last bowel movement was today. She had diarrhea. She reports that she is compliant with her lactulose which was given to her for constipation. ER provider reviewed records and noted that on 1123 patient has requested additional pain medications from her provider and received 5 days of narcotics which has run out today  Has been admitted 1 m ago with AKI cr up to 3.4 She underwent a CT scan of the abdomen pelvis which showed moderate ascites. She subsequently underwent paracentesis by the ER provider (SAAG >1.1) . Ongoing Hep B liver did not look cirrhotic on imaging. She is status post liver biopsy by IR, transjugular approach.Biopsy showed grade 1 to fibrosis as well as low-grade chronic hepatitis. Was seen by [facility] Dr. Orvan Falconer, Patient has been placed  on low-dose diuretics and 2g sodium restricted diet. Findings felt to support portal hypertension as the cause of ascites although patient does not show cirrhosis. Gi felt she not is a candidate for antiviral therapy knowing her history of  non-compliance.  She also has follow-up December 8 with GI.   She had previously been discharged from Dr. Rossie Muskrat practice due to noncomplaince Records note treatment with tenofovir during pregnancy, but, patient stopped taking treatment on her own and prior evaluation for unexplained ascites   AKI thought to be due to urinary retention needing foley catheter She was discharged with a Foley catheter and she has an appointment with urology the same day of discharge. She has seen her urologist, and failed a voiding trial and had her catheter reinserted.    She underwent EGD in February 2020 which showed gastritis. Colonoscopy was done in July 2020 which was also reported as being normal. It appears the patient subsequently underwent diagnostic laparoscopy in the [facility] system in March 2020. She apparently wasalso followed [place] specialist [place].   Infectious risk factors:  Reports Diarrhea/abdominal pain,    Has   been vaccinated against COVID    Initial COVID TEST  NEGATIVE    Review of Systems:,   Pertinent positives include:  abdominal pain, urinary retention   Constitutional:  No weight loss, night sweats, Fevers, chills, weight loss fatigue  HEENT:  No headaches, Difficulty swallowing,Tooth/dental problems,Sore throat,  No sneezing, itching, ear ache, nasal congestion, post nasal drip,  Cardio-vascular:  No chest pain, Orthopnea, PND, anasarca, dizziness, palpitations.no Bilateral lower extremity swelling  GI:  No heartburn, indigestion nausea, vomiting, diarrhea, change in bowel habits, loss of appetite, melena, blood in stool, hematemesis Resp:  No shortness of breath at rest. No dyspnea on exertion,No excess mucus, no productive cough, No non-productive cough, No coughing up of blood.No change in color of mucus.No wheezing. Skin:  no rash or lesions. No jaundice GU:  no dysuria, change in color of urine, no urgency or frequency. No straining to  urinate.  No flank pain.  Musculoskeletal:  No joint pain or no joint swelling. No decreased range of motion. No back pain.  Psych:  No change in mood or affect. No depression or anxiety. No memory loss.  Neuro: no localizing neurological complaints, no tingling, no weakness, no double vision, no gait abnormality, no slurred speech, no confusion   All systems reviewed and apart from HOPI all are negative  Past Medical History:  Diagnosis Date  . Abdominal pain    started in mid abd and spread to left side/on and off/since Jan 2020/ pt states, she has fluid in abd/unknown  . Abnormal Pap smear    normal 06/2013  . Anxiety   . Constipation    hx of  . Depression    suicide attempt in college  . Diabetes mellitus without complication (HCC)    gestational, no issues since pregnancy per her report - resolved  . Gestational diabetes    /no meds now - resolved  . Hepatitis    Hep B, managed by Dr. Kinnie Scales  . History of chicken pox   . History of UTI   . Vaginal Pap smear, abnormal    Past Surgical History:  Procedure Laterality Date  . CESAREAN SECTION N/A 01/26/2013   Procedure: Primary Cesarean Section Delivery Baby  Boy @ 0017, Apgars 4/8/9;  Surgeon: Freddrick March. Tenny Craw, MD;  Location: [facility];  Service: Obstetrics;  Laterality: N/A;  . CESAREAN SECTION  N/A 11/18/2017   Procedure: CESAREAN SECTION;  Surgeon: Olivia Mackie, MD;  Location: [facility]IRTH[facility] SU[facility];  Service: Obstetrics;  Laterality: N/A;  . COLONOSCOPY  2020  . DIAGNOSTIC LAPAROSCOPY  08/25/2018   Dr Clifton James with Ob/Gyn3/2020  . ESOPHAGOGASTRODUODENOSCOPY ENDOSCOPY    . IR TRANSCATHETER BX  04/18/2020  . IR US GUIDE VASC ACCESS RIGHT  04/18/2020  . IR VENOGRAM HEPATIC W HEMODYNAMIC EVALUATION  04/18/2020  . NASAL SINUS SURGERY  1996  . WISDOM TOOTH EXTRACTION     Social History:  reports that she quit smoking about 8 years ago. Her smoking use included cigarettes. She smoked 1.00 pack per day. She has never used smokeless  tobacco. She reports current alcohol use of about 5.0 standard drinks of alcohol per week. She reports previous drug use. Drug: Marijuana.  Allergies  Allergen Reactions  . Contrast Media [Iodinated Diagnostic Agents] Anaphylaxis, Shortness Of Breath and Rash    IV contrast    Family History  Adopted: Yes  Family history unknown: Yes    Prior to Admission medications   Medication Sig Start Date End Date Taking? Authorizing Provider  docusate sodium (COLACE) 100 MG capsule Take 1 capsule (100 mg total) by mouth 2 (two) times daily. 04/12/20  Yes Koberlein, Junell C, MD  furosemide (LASIX) 20 MG tablet Take 1 tablet (20 mg total) by mouth daily. 04/20/20  Yes Gherghe, Daylene Katayama, MD  oxyCODONE (OXY IR/ROXICODONE) 5 MG immediate release tablet Take 1 tablet (5 mg total) by mouth every 12 (twelve) hours as needed for severe pain. 05/02/20  Yes Koberlein, Paris Lore, MD  spironolactone (ALDACTONE) 25 MG tablet Take 1 tablet (25 mg total) by mouth daily. 04/20/20  Yes Gherghe, Daylene Katayama, MD  ondansetron (ZOFRAN ODT) 4 MG disintegrating tablet Take 1 tablet (4 mg total) by mouth every 8 (eight) hours as needed for nausea or vomiting. 04/12/20   Wynn Banker, MD  polyethylene glycol (GOLYTELY) 236 g solution Drink 8 oz solution every 1-2 hours as needed for constipation 04/12/20   Wynn Banker, MD   Physical Exam: Blood pressure 100/60, pulse 73, temperature 98.2 F (36.8 C), temperature source Oral, resp. rate (!) 21, last menstrual period 04/23/2020, SpO2 99 %. Vitals:   05/07/20 2206 05/07/20 2300  BP: 96/86 100/60  Pulse: (!) 57 73  Resp: 18 (!) 21  Temp:    SpO2: 99% 99%     1. General:  in No Acute distress   Chronically ill  -appearing  2. Psychological: Alert and   Oriented  3. Head/ENT:    Dry Mucous Membranes                           Head Non traumatic, neck supple                            Poor Dentition  4. SKIN:  decreased Skin turgor,  Skin clean Dry and  intact no rash  5. Heart: Regular rate and rhythm no Murmur, no Rub or gallop  6. Lungs: Clear to auscultation bilaterally, no wheezes or crackles    7. Abdomen: Soft, epigastric tender, Non distended    8. Lower extremities: no clubbing, cyanosis, or  edema  9. Neurologically Grossly intact, moving all 4 extremities equally   10. MSK: Normal range of motion  Labs on Admission:  Basic Metabolic Panel: Recent Labs  Lab 05/07/20 1721  NA 135  K 3.6  CL 101  CO2 24  GLUCOSE 92  BUN 16  CREATININE 0.92  CALCIUM 9.0   Liver Function Tests: Recent Labs  Lab 05/07/20 1721  AST 30  ALT 38  ALKPHOS 54  BILITOT 0.9  PROT 8.5*  ALBUMIN 4.5   Recent Labs  Lab 05/07/20 1721  LIPASE 37   Recent Labs  Lab 05/07/20 1739  AMMONIA 28   CBC: Recent Labs  Lab 05/07/20 1721 05/07/20 2220  WBC 7.9 8.1  NEUTROABS  --  5.5  HGB 14.0 12.7  HCT 40.8 37.7  MCV 90.5 89.8  PLT 276 249     Radiological Exams on Admission: CT Abdomen Pelvis Wo Contrast  Result Date: 05/07/2020 CLINICAL DATA:  Abdominal pain nonlocalized. EXAM: CT ABDOMEN AND PELVIS WITHOUT CONTRAST TECHNIQUE: Multidetector CT imaging of the abdomen and pelvis was performed following the standard protocol without IV contrast. COMPARISON:  CT abdomen pelvis 04/13/2020 FINDINGS: Lower chest: No acute abnormality. Hepatobiliary: No focal liver abnormality. No gallstones, gallbladder wall thickening, or pericholecystic fluid. No biliary dilatation. Pancreas: No focal lesion. Normal pancreatic contour. No surrounding inflammatory changes. No main pancreatic ductal dilatation. Spleen: Normal in size without focal abnormality. Adrenals/Urinary Tract: No adrenal nodule bilaterally. Bilateral kidneys enhance symmetrically. No hydronephrosis. No hydroureter. The urinary bladder is decompressed with a urinary Foley catheter terminating within its lumen. Stomach/Bowel: Trace PO contrast noted within the lumen of the stomach.  Gastric wall pneumatosis with several foci of free intraperitoneal gas in a linear formation along the gastric fundus. No evidence of bowel wall thickening or dilatation. Appendix appears normal. Vascular/Lymphatic: No abdominal aorta or iliac aneurysm. No abdominal, pelvic, or inguinal lymphadenopathy. Reproductive: Uterus and bilateral adnexa are unremarkable. Other: Interval decrease in now small volume simple free fluid ascites. Interval development of moderate volume free intraperitoneal gas. Musculoskeletal: Interval development of a nonspecific 0.8 cm soft tissue density within the left lateral anterior abdominal wall (2:48). Similar finding measuring 0.5 cm more superiorly (2:44). No suspicious lytic or blastic osseous lesions. No acute displaced fracture. IMPRESSION: 1. Suggestion of gastric fundus perforation with associated moderate pneumoperitoneum. 2. Interval decrease in small volume simple free fluid ascites within the pelvis. These results were called by telephone at the time of interpretation on 05/07/2020 at 10:03 pm to provider PA [facility] HAMMOND , who verbally acknowledged these results. Electronically Signed   By: Tish FredericksonMorgane  Naveau M.D.   On: 05/07/2020 22:09   DG Chest Portable 1 View  Result Date: 05/07/2020 CLINICAL DATA:  Free air on recent CT examination. EXAM: PORTABLE CHEST 1 VIEW COMPARISON:  04/15/2020 FINDINGS: Cardiac shadow is stable. The lungs are well aerated bilaterally. No focal infiltrate or sizable effusion is seen. Free air is noted primarily beneath the right hemidiaphragm similar to that seen on recent CT examination. No bony abnormality is noted. IMPRESSION: Free air within the abdomen similar to that seen on recent CT. No other focal abnormality is noted. Electronically Signed   By: Alcide CleverMark  Lukens M.D.   On: 05/07/2020 23:06    EKG: Independently reviewed.     ECG: Ordered Personally reviewed by me showing: HR : 65 Rhythm:  NSR   no evidence of ischemic  changes QTC 417 Time spent:2868m  Therisa Doynenastassia Renesmay Nesbitt [facility] Pager [phone]  If 7PM-7AM, please contact night-coverage [email or web address] Password [email or web address] 05/08/2020, 12:00 AM

## 2020-05-07 NOTE — ED Notes (Signed)
OR requesting patient ASAP. Clothing removed. Pt wiped down. No family to take her belongings.

## 2020-05-07 NOTE — ED Triage Notes (Signed)
Pt presents with c/o abdominal pain. Pt seen 2 weeks ago for ascites and reports she was admitted and had fluid drawn off of her abdomen.

## 2020-05-08 ENCOUNTER — Inpatient Hospital Stay (HOSPITAL_COMMUNITY): Payer: BC Managed Care – PPO

## 2020-05-08 ENCOUNTER — Encounter (HOSPITAL_COMMUNITY): Payer: Self-pay | Admitting: Surgery

## 2020-05-08 ENCOUNTER — Encounter: Payer: Self-pay | Admitting: Family Medicine

## 2020-05-08 DIAGNOSIS — K760 Fatty (change of) liver, not elsewhere classified: Secondary | ICD-10-CM | POA: Diagnosis present

## 2020-05-08 DIAGNOSIS — K2920 Alcoholic gastritis without bleeding: Secondary | ICD-10-CM | POA: Diagnosis not present

## 2020-05-08 DIAGNOSIS — Z8632 Personal history of gestational diabetes: Secondary | ICD-10-CM | POA: Diagnosis not present

## 2020-05-08 DIAGNOSIS — K766 Portal hypertension: Secondary | ICD-10-CM

## 2020-05-08 DIAGNOSIS — Z87440 Personal history of urinary (tract) infections: Secondary | ICD-10-CM

## 2020-05-08 DIAGNOSIS — K668 Other specified disorders of peritoneum: Secondary | ICD-10-CM | POA: Diagnosis present

## 2020-05-08 DIAGNOSIS — B181 Chronic viral hepatitis B without delta-agent: Secondary | ICD-10-CM | POA: Diagnosis present

## 2020-05-08 DIAGNOSIS — R933 Abnormal findings on diagnostic imaging of other parts of digestive tract: Secondary | ICD-10-CM | POA: Diagnosis not present

## 2020-05-08 DIAGNOSIS — R188 Other ascites: Secondary | ICD-10-CM | POA: Diagnosis present

## 2020-05-08 DIAGNOSIS — Z9151 Personal history of suicidal behavior: Secondary | ICD-10-CM | POA: Diagnosis not present

## 2020-05-08 DIAGNOSIS — K769 Liver disease, unspecified: Secondary | ICD-10-CM | POA: Diagnosis not present

## 2020-05-08 DIAGNOSIS — R339 Retention of urine, unspecified: Secondary | ICD-10-CM | POA: Diagnosis present

## 2020-05-08 DIAGNOSIS — Z87891 Personal history of nicotine dependence: Secondary | ICD-10-CM | POA: Diagnosis not present

## 2020-05-08 DIAGNOSIS — K3189 Other diseases of stomach and duodenum: Secondary | ICD-10-CM | POA: Diagnosis not present

## 2020-05-08 DIAGNOSIS — R935 Abnormal findings on diagnostic imaging of other abdominal regions, including retroperitoneum: Secondary | ICD-10-CM | POA: Diagnosis not present

## 2020-05-08 DIAGNOSIS — Z87898 Personal history of other specified conditions: Secondary | ICD-10-CM

## 2020-05-08 DIAGNOSIS — R1084 Generalized abdominal pain: Secondary | ICD-10-CM | POA: Diagnosis not present

## 2020-05-08 DIAGNOSIS — K5909 Other constipation: Secondary | ICD-10-CM | POA: Diagnosis present

## 2020-05-08 DIAGNOSIS — F419 Anxiety disorder, unspecified: Secondary | ICD-10-CM | POA: Insufficient documentation

## 2020-05-08 DIAGNOSIS — Z20822 Contact with and (suspected) exposure to covid-19: Secondary | ICD-10-CM | POA: Diagnosis present

## 2020-05-08 DIAGNOSIS — K2950 Unspecified chronic gastritis without bleeding: Secondary | ICD-10-CM | POA: Diagnosis present

## 2020-05-08 DIAGNOSIS — R1030 Lower abdominal pain, unspecified: Secondary | ICD-10-CM | POA: Diagnosis not present

## 2020-05-08 DIAGNOSIS — Z79899 Other long term (current) drug therapy: Secondary | ICD-10-CM | POA: Diagnosis not present

## 2020-05-08 DIAGNOSIS — Z91041 Radiographic dye allergy status: Secondary | ICD-10-CM | POA: Diagnosis not present

## 2020-05-08 DIAGNOSIS — K660 Peritoneal adhesions (postprocedural) (postinfection): Secondary | ICD-10-CM | POA: Diagnosis present

## 2020-05-08 DIAGNOSIS — M545 Low back pain, unspecified: Secondary | ICD-10-CM | POA: Diagnosis present

## 2020-05-08 DIAGNOSIS — G8929 Other chronic pain: Secondary | ICD-10-CM | POA: Diagnosis present

## 2020-05-08 DIAGNOSIS — F1010 Alcohol abuse, uncomplicated: Secondary | ICD-10-CM | POA: Diagnosis present

## 2020-05-08 HISTORY — DX: Other specified disorders of peritoneum: K66.8

## 2020-05-08 HISTORY — DX: Personal history of urinary (tract) infections: Z87.440

## 2020-05-08 LAB — RETICULOCYTES
Immature Retic Fract: 4.4 % (ref 2.3–15.9)
RBC.: 4.23 MIL/uL (ref 3.87–5.11)
Retic Count, Absolute: 90.9 10*3/uL (ref 19.0–186.0)
Retic Ct Pct: 2.2 % (ref 0.4–3.1)

## 2020-05-08 LAB — GLUCOSE, CAPILLARY: Glucose-Capillary: 128 mg/dL — ABNORMAL HIGH (ref 70–99)

## 2020-05-08 LAB — PREALBUMIN: Prealbumin: 16.5 mg/dL — ABNORMAL LOW (ref 18–38)

## 2020-05-08 LAB — FERRITIN: Ferritin: 99 ng/mL (ref 11–307)

## 2020-05-08 LAB — IRON AND TIBC
Iron: 67 ug/dL (ref 28–170)
Saturation Ratios: 19 % (ref 10.4–31.8)
TIBC: 349 ug/dL (ref 250–450)
UIBC: 282 ug/dL

## 2020-05-08 LAB — TYPE AND SCREEN
ABO/RH(D): A POS
Antibody Screen: NEGATIVE

## 2020-05-08 LAB — CBC
HCT: 39.2 % (ref 36.0–46.0)
Hemoglobin: 12.9 g/dL (ref 12.0–15.0)
MCH: 30.7 pg (ref 26.0–34.0)
MCHC: 32.9 g/dL (ref 30.0–36.0)
MCV: 93.3 fL (ref 80.0–100.0)
Platelets: 212 10*3/uL (ref 150–400)
RBC: 4.2 MIL/uL (ref 3.87–5.11)
RDW: 11.8 % (ref 11.5–15.5)
WBC: 6 10*3/uL (ref 4.0–10.5)
nRBC: 0 % (ref 0.0–0.2)

## 2020-05-08 LAB — VITAMIN B12: Vitamin B-12: 674 pg/mL (ref 180–914)

## 2020-05-08 LAB — MAGNESIUM: Magnesium: 1.9 mg/dL (ref 1.7–2.4)

## 2020-05-08 LAB — FOLATE: Folate: 14.2 ng/mL (ref >5.9)

## 2020-05-08 LAB — PHOSPHORUS: Phosphorus: 4 mg/dL (ref 2.5–4.6)

## 2020-05-08 MED ORDER — ONDANSETRON HCL 4 MG/2ML IJ SOLN
INTRAMUSCULAR | Status: DC | PRN
  Administered 2020-05-08: 4 mg via INTRAVENOUS

## 2020-05-08 MED ORDER — LACTATED RINGERS IR SOLN
Status: DC | PRN
  Administered 2020-05-08: 1000 mL

## 2020-05-08 MED ORDER — SODIUM CHLORIDE 0.9 % IV SOLN
2.0000 g | Freq: Two times a day (BID) | INTRAVENOUS | Status: AC
  Administered 2020-05-08: 2 g via INTRAVENOUS
  Filled 2020-05-08: qty 2

## 2020-05-08 MED ORDER — DEXAMETHASONE SODIUM PHOSPHATE 10 MG/ML IJ SOLN
INTRAMUSCULAR | Status: DC | PRN
  Administered 2020-05-08: 8 mg via INTRAVENOUS

## 2020-05-08 MED ORDER — PHENYLEPHRINE 40 MCG/ML (10ML) SYRINGE FOR IV PUSH (FOR BLOOD PRESSURE SUPPORT)
PREFILLED_SYRINGE | INTRAVENOUS | Status: AC
  Filled 2020-05-08: qty 10

## 2020-05-08 MED ORDER — ROCURONIUM BROMIDE 10 MG/ML (PF) SYRINGE
PREFILLED_SYRINGE | INTRAVENOUS | Status: DC | PRN
  Administered 2020-05-08: 50 mg via INTRAVENOUS

## 2020-05-08 MED ORDER — DEXAMETHASONE SODIUM PHOSPHATE 10 MG/ML IJ SOLN
INTRAMUSCULAR | Status: AC
  Filled 2020-05-08: qty 1

## 2020-05-08 MED ORDER — 0.9 % SODIUM CHLORIDE (POUR BTL) OPTIME
TOPICAL | Status: DC | PRN
  Administered 2020-05-08: 1000 mL

## 2020-05-08 MED ORDER — SUCCINYLCHOLINE CHLORIDE 200 MG/10ML IV SOSY
PREFILLED_SYRINGE | INTRAVENOUS | Status: DC | PRN
  Administered 2020-05-07: 100 mg via INTRAVENOUS

## 2020-05-08 MED ORDER — LACTATED RINGERS IV SOLN: INTRAVENOUS | Status: DC | PRN

## 2020-05-08 MED ORDER — ONDANSETRON HCL 4 MG/2ML IJ SOLN
INTRAMUSCULAR | Status: AC
  Filled 2020-05-08: qty 2

## 2020-05-08 MED ORDER — EPHEDRINE 5 MG/ML INJ
INTRAVENOUS | Status: AC
  Filled 2020-05-08: qty 10

## 2020-05-08 MED ORDER — SUGAMMADEX SODIUM 200 MG/2ML IV SOLN
INTRAVENOUS | Status: DC | PRN
  Administered 2020-05-08: 200 mg via INTRAVENOUS

## 2020-05-08 MED ORDER — LIDOCAINE 2% (20 MG/ML) 5 ML SYRINGE
INTRAMUSCULAR | Status: DC | PRN
  Administered 2020-05-07: 40 mg via INTRAVENOUS

## 2020-05-08 MED ORDER — MIDAZOLAM HCL 2 MG/2ML IJ SOLN
INTRAMUSCULAR | Status: AC
  Filled 2020-05-08: qty 2

## 2020-05-08 MED ORDER — MIDAZOLAM HCL 5 MG/5ML IJ SOLN
INTRAMUSCULAR | Status: DC | PRN
  Administered 2020-05-07: 2 mg via INTRAVENOUS

## 2020-05-08 MED ORDER — SODIUM CHLORIDE 0.9 % IV SOLN: Freq: Three times a day (TID) | INTRAVENOUS | Status: AC | PRN

## 2020-05-08 MED ORDER — BOOST / RESOURCE BREEZE PO LIQD CUSTOM
1.0000 | Freq: Three times a day (TID) | ORAL | Status: DC
  Administered 2020-05-08 – 2020-05-12 (×9): 1 via ORAL

## 2020-05-08 MED ORDER — PHENYLEPHRINE 40 MCG/ML (10ML) SYRINGE FOR IV PUSH (FOR BLOOD PRESSURE SUPPORT)
PREFILLED_SYRINGE | INTRAVENOUS | Status: DC | PRN
  Administered 2020-05-08: 40 ug via INTRAVENOUS

## 2020-05-08 MED ORDER — FENTANYL CITRATE (PF) 250 MCG/5ML IJ SOLN
INTRAMUSCULAR | Status: AC
  Filled 2020-05-08: qty 5

## 2020-05-08 MED ORDER — STERILE WATER FOR IRRIGATION IR SOLN
Status: DC | PRN
  Administered 2020-05-08: 1000 mL

## 2020-05-08 MED ORDER — LACTATED RINGERS IV SOLN: INTRAVENOUS | Status: DC

## 2020-05-08 MED ORDER — ACETAMINOPHEN 325 MG PO TABS: 650.0000 mg | ORAL_TABLET | Freq: Four times a day (QID) | ORAL | Status: DC | PRN

## 2020-05-08 MED ORDER — ROCURONIUM BROMIDE 10 MG/ML (PF) SYRINGE
PREFILLED_SYRINGE | INTRAVENOUS | Status: AC
  Filled 2020-05-08: qty 10

## 2020-05-08 MED ORDER — PROPOFOL 10 MG/ML IV BOLUS
INTRAVENOUS | Status: DC | PRN
  Administered 2020-05-07: 140 mg via INTRAVENOUS

## 2020-05-08 MED ORDER — LACTATED RINGERS IV SOLN: INTRAVENOUS | Status: AC

## 2020-05-08 MED ORDER — CHLORHEXIDINE GLUCONATE CLOTH 2 % EX PADS
6.0000 | MEDICATED_PAD | Freq: Every day | CUTANEOUS | Status: DC
  Administered 2020-05-09 – 2020-05-12 (×4): 6 via TOPICAL

## 2020-05-08 MED ORDER — PANTOPRAZOLE SODIUM 40 MG IV SOLR
40.0000 mg | INTRAVENOUS | Status: DC
  Administered 2020-05-09 – 2020-05-11 (×4): 40 mg via INTRAVENOUS
  Filled 2020-05-08 (×4): qty 40

## 2020-05-08 MED ORDER — BUPIVACAINE-EPINEPHRINE 0.25% -1:200000 IJ SOLN
INTRAMUSCULAR | Status: DC | PRN
  Administered 2020-05-08: 30 mL

## 2020-05-08 MED ORDER — OXYCODONE HCL 5 MG PO TABS
5.0000 mg | ORAL_TABLET | ORAL | Status: DC | PRN
  Administered 2020-05-08 – 2020-05-12 (×6): 5 mg via ORAL
  Filled 2020-05-08 (×7): qty 1

## 2020-05-08 MED ORDER — FENTANYL CITRATE (PF) 100 MCG/2ML IJ SOLN
INTRAMUSCULAR | Status: AC
  Filled 2020-05-08: qty 2

## 2020-05-08 MED ORDER — EPHEDRINE SULFATE-NACL 50-0.9 MG/10ML-% IV SOSY
PREFILLED_SYRINGE | INTRAVENOUS | Status: DC | PRN
  Administered 2020-05-08: 10 mg via INTRAVENOUS

## 2020-05-08 MED ORDER — FENTANYL CITRATE (PF) 100 MCG/2ML IJ SOLN
25.0000 ug | INTRAMUSCULAR | Status: DC | PRN
  Administered 2020-05-08: 50 ug via INTRAVENOUS
  Administered 2020-05-08 (×2): 25 ug via INTRAVENOUS

## 2020-05-08 MED ORDER — ONDANSETRON HCL 4 MG/2ML IJ SOLN: 4.0000 mg | Freq: Once | INTRAMUSCULAR | Status: DC | PRN

## 2020-05-08 MED ORDER — FENTANYL CITRATE (PF) 100 MCG/2ML IJ SOLN
INTRAMUSCULAR | Status: DC | PRN
  Administered 2020-05-07: 100 ug via INTRAVENOUS
  Administered 2020-05-08 (×3): 50 ug via INTRAVENOUS

## 2020-05-08 MED ORDER — LIDOCAINE HCL (PF) 2 % IJ SOLN
INTRAMUSCULAR | Status: AC
  Filled 2020-05-08: qty 5

## 2020-05-08 MED ORDER — SUCCINYLCHOLINE CHLORIDE 200 MG/10ML IV SOSY
PREFILLED_SYRINGE | INTRAVENOUS | Status: AC
  Filled 2020-05-08: qty 10

## 2020-05-08 MED ORDER — ENOXAPARIN SODIUM 40 MG/0.4ML ~~LOC~~ SOLN
40.0000 mg | SUBCUTANEOUS | Status: AC
  Administered 2020-05-09: 40 mg via SUBCUTANEOUS
  Filled 2020-05-08: qty 0.4

## 2020-05-08 NOTE — Anesthesia Postprocedure Evaluation (Signed)
Anesthesia Post Note  Patient: Kim Pacheco  Procedure(s) Performed: DIAGNOSTIC LAPAROSCOPIC (N/A Abdomen)     Patient location during evaluation: PACU Anesthesia Type: General Level of consciousness: awake and alert Pain management: pain level controlled Vital Signs Assessment: post-procedure vital signs reviewed and stable Respiratory status: spontaneous breathing, nonlabored ventilation, respiratory function stable and patient connected to nasal cannula oxygen Cardiovascular status: blood pressure returned to baseline and stable Postop Assessment: no apparent nausea or vomiting Anesthetic complications: no   No complications documented.  Last Vitals:  Vitals:   05/08/20 1409 05/08/20 1646  BP: 106/66 104/65  Pulse: 60 (!) 59  Resp: 16 18  Temp: 36.5 C   SpO2: 97% 100%    Last Pain:  Vitals:   05/08/20 1409  TempSrc: Oral  PainSc:                  Nelle Don Amiel Sharrow

## 2020-05-08 NOTE — Progress Notes (Signed)
Progress Note  1 Day Post-Op  Subjective: Patient reports some abdominal pain but more irritation from NGT. Tolerating CLD and denies nausea. Reports pressure sensation in abdomen is improved. No flatus yet. Patient has had issues with urinary retention for the last 2 weeks and was scheduled to see Dr. Earlene Plater today.   Objective: Vital signs in last 24 hours: Temp:  [97.5 F (36.4 C)-98.3 F (36.8 C)] 98.1 F (36.7 C) (11/29 0604) Pulse Rate:  [56-91] 74 (11/29 0642) Resp:  [10-21] 14 (11/29 0642) BP: (96-130)/(58-95) 100/62 (11/29 0642) SpO2:  [95 %-100 %] 98 % (11/29 0642) FiO2 (%):  [24 %] 24 % (11/29 0251) Weight:  [58 kg] 58 kg (11/29 0321)    Intake/Output from previous day: 11/28 0701 - 11/29 0700 In: 2353.9 [I.V.:2203.9; IV Piggyback:150] Out: 625 [Urine:600; Blood:25] Intake/Output this shift: Total I/O In: -  Out: 200 [Urine:200]  PE: General: pleasant, WD, WN female who is laying in bed in NAD Heart: regular, rate, and rhythm.   Lungs: CTAB, no wheezes, rhonchi, or rales noted.  Respiratory effort nonlabored Abd: soft, appropriately ttp, ND, BS hypoactive, incisions c/d/i GU: foley present     Lab Results:  Recent Labs    05/07/20 2220 05/08/20 0319  WBC 8.1 6.0  HGB 12.7 12.9  HCT 37.7 39.2  PLT 249 212   BMET Recent Labs    05/07/20 1721  NA 135  K 3.6  CL 101  CO2 24  GLUCOSE 92  BUN 16  CREATININE 0.92  CALCIUM 9.0   PT/INR Recent Labs    05/07/20 1721  LABPROT 12.8  INR 1.0   CMP     Component Value Date/Time   NA 135 05/07/2020 1721   K 3.6 05/07/2020 1721   CL 101 05/07/2020 1721   CO2 24 05/07/2020 1721   GLUCOSE 92 05/07/2020 1721   BUN 16 05/07/2020 1721   CREATININE 0.92 05/07/2020 1721   CALCIUM 9.0 05/07/2020 1721   PROT 8.5 (H) 05/07/2020 1721   ALBUMIN 4.5 05/07/2020 1721   AST 30 05/07/2020 1721   ALT 38 05/07/2020 1721   ALKPHOS 54 05/07/2020 1721   BILITOT 0.9 05/07/2020 1721   GFRNONAA >60 05/07/2020  1721   GFRAA >60 06/26/2018 0531   Lipase     Component Value Date/Time   LIPASE 37 05/07/2020 1721       Studies/Results: CT Abdomen Pelvis Wo Contrast  Result Date: 05/07/2020 CLINICAL DATA:  Abdominal pain nonlocalized. EXAM: CT ABDOMEN AND PELVIS WITHOUT CONTRAST TECHNIQUE: Multidetector CT imaging of the abdomen and pelvis was performed following the standard protocol without IV contrast. COMPARISON:  CT abdomen pelvis 04/13/2020 FINDINGS: Lower chest: No acute abnormality. Hepatobiliary: No focal liver abnormality. No gallstones, gallbladder wall thickening, or pericholecystic fluid. No biliary dilatation. Pancreas: No focal lesion. Normal pancreatic contour. No surrounding inflammatory changes. No main pancreatic ductal dilatation. Spleen: Normal in size without focal abnormality. Adrenals/Urinary Tract: No adrenal nodule bilaterally. Bilateral kidneys enhance symmetrically. No hydronephrosis. No hydroureter. The urinary bladder is decompressed with a urinary Foley catheter terminating within its lumen. Stomach/Bowel: Trace PO contrast noted within the lumen of the stomach. Gastric wall pneumatosis with several foci of free intraperitoneal gas in a linear formation along the gastric fundus. No evidence of bowel wall thickening or dilatation. Appendix appears normal. Vascular/Lymphatic: No abdominal aorta or iliac aneurysm. No abdominal, pelvic, or inguinal lymphadenopathy. Reproductive: Uterus and bilateral adnexa are unremarkable. Other: Interval decrease in now small volume simple free  fluid ascites. Interval development of moderate volume free intraperitoneal gas. Musculoskeletal: Interval development of a nonspecific 0.8 cm soft tissue density within the left lateral anterior abdominal wall (2:48). Similar finding measuring 0.5 cm more superiorly (2:44). No suspicious lytic or blastic osseous lesions. No acute displaced fracture. IMPRESSION: 1. Suggestion of gastric fundus perforation with  associated moderate pneumoperitoneum. 2. Interval decrease in small volume simple free fluid ascites within the pelvis. These results were called by telephone at the time of interpretation on 05/07/2020 at 10:03 pm to provider PA [facility] , who verbally acknowledged these results. Electronically Signed   By: Tish Frederickson M.D.   On: 05/07/2020 22:09   X-ray abdomen AP  Result Date: 05/08/2020 CLINICAL DATA:  Check gastric catheter placement EXAM: ABDOMEN - 1 VIEW COMPARISON:  None. FINDINGS: Gastric catheter extends into the stomach. Previously seen free air is less well appreciated on this exam given the recent operative intervention. No other focal abnormality is noted. IMPRESSION: Gastric catheter within the stomach. Electronically Signed   By: Alcide Clever M.D.   On: 05/08/2020 02:13   DG Chest Portable 1 View  Result Date: 05/07/2020 CLINICAL DATA:  Free air on recent CT examination. EXAM: PORTABLE CHEST 1 VIEW COMPARISON:  04/15/2020 FINDINGS: Cardiac shadow is stable. The lungs are well aerated bilaterally. No focal infiltrate or sizable effusion is seen. Free air is noted primarily beneath the right hemidiaphragm similar to that seen on recent CT examination. No bony abnormality is noted. IMPRESSION: Free air within the abdomen similar to that seen on recent CT. No other focal abnormality is noted. Electronically Signed   By: Alcide Clever M.D.   On: 05/07/2020 23:06    Anti-infectives: Anti-infectives (From admission, onward)   Start     Dose/Rate Route Frequency Ordered Stop   05/08/20 1200  cefoTEtan (CEFOTAN) 2 g in sodium chloride 0.9 % 100 mL IVPB        2 g 200 mL/hr over 30 Minutes Intravenous Every 12 hours 05/08/20 0250 05/08/20 2159   05/08/20 0600  piperacillin-tazobactam (ZOSYN) IVPB 3.375 g  Status:  Discontinued        3.375 g 12.5 mL/hr over 240 Minutes Intravenous Every 8 hours 05/07/20 2234 05/08/20 0250   05/07/20 2330  cefoTEtan (CEFOTAN) 2 g in sodium  chloride 0.9 % 100 mL IVPB        2 g 200 mL/hr over 30 Minutes Intravenous On call to O.R. 05/07/20 2318 05/08/20 0011   05/07/20 2245  piperacillin-tazobactam (ZOSYN) IVPB 3.375 g        3.375 g 100 mL/hr over 30 Minutes Intravenous STAT 05/07/20 2231 05/07/20 2323       Assessment/Plan Chronic Hep B with cirrhosis Hx of alcohol use Chronic abdominal pain and constipation Urinary retention - was supposed to see Dr. Earlene Plater in the office today, will reach out to urology, continue foley  Pneumoperitoneum of unknown etiology  S/p diagnostic laparoscopy with LOA 05/08/20 Dr. Michaell Cowing - POD#0 - no perforation identified intra-operatively, no evidence of peritonitis  - removed NGT and allowing CLD - mobilize as tolerated  FEN: CLD  VTE: SCDs, lovenox ID: [email or web address] 11/28>11/29; Cefotetan 11/29 Foley: was placed in urology office PTA   LOS: 0 days    Juliet Rude , [facility] Surgery 05/08/2020, 10:14 AM Please see Amion for pager number during day hours 7:00am-4:30pm

## 2020-05-08 NOTE — Progress Notes (Signed)
PROGRESS NOTE    Kim Pacheco  ZYS:[id number] DOB: 01/07/1986 DOA: 05/07/2020 PCP: [name], DO   Brief Narrative: Kim Pacheco is a 34 y.o. femalewith medical history significant of hepatitis B, ascites of unclear etiology, history of depression, history of constipation, alcohol abuse. Patient presented secondary to abdominal pain not relieved with heating pad and analgesics and found to have concern for pneumoperitoneum on CT scan. She underwent diagnostic laparoscopy by general surgery.   Assessment & Plan:   Principal Problem:   Pneumoperitoneum Active Problems:   Chronic active viral hepatitis B (HCC)   Constipation, chronic   Ascites   Hepatic steatosis   Foley catheter in place   Chronic gastritis   History of urinary retention   History of gestational diabetes   History of depression   Free intraperitoneal air   Pneumoperitoneum Initial concern for gastric perforation which was ruled out on diagnostic laparoscopy on 11/29. Pneumoperitoneum is of unknown etiology. -Per general surgery  Chronic hepatitis B Liver fibrosis Portal hypertension Patient followed by [facility] as mentioned above. Patient is on Lasix and spironolactone as an outpatient. Ascites not seen on laparoscopy. Fatty liver changes seen on laparoscopy.  Alcohol abuse Placed on CIWA on admission -Continue CIWA  History of gastritis -Continue Protonix  Urinary retention Present on admission. Patient already has a foley and plan for urology follow-up/management as an outpatient. -Continue foley  History of constipation Complicated by recent surgery. -Per primary   DVT prophylaxis: Per primary Code Status:   Code Status: Full Code Family Communication: None at bedside Disposition Plan: Per primary    Procedures:   DIAGNOSTIC LAPAROSCOPY (05/08/2020)  Antimicrobials:  None    Subjective: Abdominal pain. Feels more similar to chronic pain than  from recent surgery.  Objective: Vitals:   05/08/20 0406 05/08/20 0501 05/08/20 0604 05/08/20 0642  BP: 111/73 105/70 (!) 98/58 100/62  Pulse: 65 63 (!) 58 74  Resp: 14 12 12 14   Temp: 98.3 F (36.8 C) 98.1 F (36.7 C) 98.1 F (36.7 C)   TempSrc: Oral Oral Oral   SpO2: 100% 98% 97% 98%  Weight:      Height:        Intake/Output Summary (Last 24 hours) at 05/08/2020 1341 Last data filed at 05/08/2020 1000 Gross per 24 hour  Intake 2593.91 ml  Output 1225 ml  Net 1368.91 ml   Filed Weights   05/08/20 0321  Weight: 58 kg    Examination:  General exam: Appears calm and comfortable Respiratory system: Clear to auscultation. Respiratory effort normal. Cardiovascular system: S1 & S2 heard, RRR. No murmurs, rubs, gallops or clicks. Gastrointestinal system: Abdomen is minimally distended, soft and nontender. No organomegaly or masses felt. Normal bowel sounds heard. Central nervous system: Alert and oriented. No focal neurological deficits. Musculoskeletal: No edema. No calf tenderness Skin: No cyanosis. No rashes Psychiatry: Judgement and insight appear normal. Mood & affect appropriate.     Data Reviewed: I have personally reviewed following labs and imaging studies  CBC Lab Results  Component Value Date   WBC 6.0 05/08/2020   RBC 4.23 05/08/2020   RBC 4.20 05/08/2020   HGB 12.9 05/08/2020   HCT 39.2 05/08/2020   MCV 93.3 05/08/2020   MCH 30.7 05/08/2020   PLT 212 05/08/2020   MCHC 32.9 05/08/2020   RDW 11.8 05/08/2020   LYMPHSABS 1.9 05/07/2020   MONOABS 0.6 05/07/2020   EOSABS 0.1 05/07/2020   BASOSABS 0.1 05/07/2020  Last metabolic panel Lab Results  Component Value Date   NA 135 05/07/2020   K 3.6 05/07/2020   CL 101 05/07/2020   CO2 24 05/07/2020   BUN 16 05/07/2020   CREATININE 0.92 05/07/2020   GLUCOSE 92 05/07/2020   GFRNONAA >60 05/07/2020   GFRAA >60 06/26/2018   CALCIUM 9.0 05/07/2020   PHOS 4.0 05/08/2020   PROT 8.5 (H) 05/07/2020    ALBUMIN 4.5 05/07/2020   BILITOT 0.9 05/07/2020   ALKPHOS 54 05/07/2020   AST 30 05/07/2020   ALT 38 05/07/2020   ANIONGAP 10 05/07/2020    CBG (last 3)  Recent Labs    05/08/20 0143  GLUCAP 128*     GFR: Estimated Creatinine Clearance: 65.5 mL/min (by C-G formula based on SCr of 0.92 mg/dL).  Coagulation Profile: Recent Labs  Lab 05/07/20 1721  INR 1.0    Recent Results (from the past 240 hour(s))  Resp Panel by RT-PCR (Flu A&B, Covid) Nasopharyngeal Swab     Status: None   Collection Time: 05/07/20  5:21 PM   Specimen: Nasopharyngeal Swab; Nasopharyngeal(NP) swabs in vial transport medium  Result Value Ref Range Status   SARS Coronavirus 2 by RT PCR NEGATIVE NEGATIVE Final    Comment: (NOTE) SARS-CoV-2 target nucleic acids are NOT DETECTED.  The SARS-CoV-2 RNA is generally detectable in upper respiratory specimens during the acute phase of infection. The lowest concentration of SARS-CoV-2 viral copies this assay can detect is 138 copies/mL. A negative result does not preclude SARS-Cov-2 infection and should not be used as the sole basis for treatment or other patient management decisions. A negative result may occur with  improper specimen collection/handling, submission of specimen other than nasopharyngeal swab, presence of viral mutation(s) within the areas targeted by this assay, and inadequate number of viral copies(<138 copies/mL). A negative result must be combined with clinical observations, patient history, and epidemiological information. The expected result is Negative.  Fact Sheet for Patients:  BloggerCourse.com  Fact Sheet for Healthcare Providers:  SeriousBroker.it  This test is no t yet approved or cleared by the [place] FDA and  has been authorized for detection and/or diagnosis of SARS-CoV-2 by FDA under an Emergency Use Authorization (EUA). This EUA will remain  in effect (meaning  this test can be used) for the duration of the COVID-19 declaration under Section 564(b)(1) of the Act, 21 U.S.C.section 360bbb-3(b)(1), unless the authorization is terminated  or revoked sooner.       Influenza A by PCR NEGATIVE NEGATIVE Final   Influenza B by PCR NEGATIVE NEGATIVE Final    Comment: (NOTE) The Xpert Xpress SARS-CoV-2/FLU/RSV plus assay is intended as an aid in the diagnosis of influenza from Nasopharyngeal swab specimens and should not be used as a sole basis for treatment. Nasal washings and aspirates are unacceptable for Xpert Xpress SARS-CoV-2/FLU/RSV testing.  Fact Sheet for Patients: BloggerCourse.com  Fact Sheet for Healthcare Providers: SeriousBroker.it  This test is not yet approved or cleared by the [place] FDA and has been authorized for detection and/or diagnosis of SARS-CoV-2 by FDA under an Emergency Use Authorization (EUA). This EUA will remain in effect (meaning this test can be used) for the duration of the COVID-19 declaration under Section 564(b)(1) of the Act, 21 U.S.C. section 360bbb-3(b)(1), unless the authorization is terminated or revoked.  Performed at [facility], [address], [place], [place] [address]   Culture, blood (routine x 2)     Status: None (Preliminary result)  Collection Time: 05/07/20 10:20 PM   Specimen: BLOOD  Result Value Ref Range Status   Specimen Description   Final    BLOOD RIGHT ANTECUBITAL Performed at [facility], [address], [place], [place] [address]    Special Requests   Final    BOTTLES DRAWN AEROBIC AND ANAEROBIC Blood Culture adequate volume Performed at [facility], [address], [place], [place] [address]    Culture   Final    NO GROWTH < 12 HOURS Performed at [facility], [address], [place], [place] [address]    Report Status PENDING  Incomplete  Culture,  blood (routine x 2)     Status: None (Preliminary result)   Collection Time: 05/08/20  3:19 AM   Specimen: BLOOD  Result Value Ref Range Status   Specimen Description   Final    BLOOD BLOOD RIGHT HAND Performed at [facility], [address] [place], [place] [address]    Special Requests   Final    BOTTLES DRAWN AEROBIC ONLY Blood Culture results may not be optimal due to an inadequate volume of blood received in culture bottles Performed at [facility], [address], [place], [place] [address]    Culture   Final    NO GROWTH < 12 HOURS Performed at [facility], [address] [place], [place] [address]    Report Status PENDING  Incomplete        Radiology Studies: CT Abdomen Pelvis Wo Contrast  Result Date: 05/07/2020 CLINICAL DATA:  Abdominal pain nonlocalized. EXAM: CT ABDOMEN AND PELVIS WITHOUT CONTRAST TECHNIQUE: Multidetector CT imaging of the abdomen and pelvis was performed following the standard protocol without IV contrast. COMPARISON:  CT abdomen pelvis 04/13/2020 FINDINGS: Lower chest: No acute abnormality. Hepatobiliary: No focal liver abnormality. No gallstones, gallbladder wall thickening, or pericholecystic fluid. No biliary dilatation. Pancreas: No focal lesion. Normal pancreatic contour. No surrounding inflammatory changes. No main pancreatic ductal dilatation. Spleen: Normal in size without focal abnormality. Adrenals/Urinary Tract: No adrenal nodule bilaterally. Bilateral kidneys enhance symmetrically. No hydronephrosis. No hydroureter. The urinary bladder is decompressed with a urinary Foley catheter terminating within its lumen. Stomach/Bowel: Trace PO contrast noted within the lumen of the stomach. Gastric wall pneumatosis with several foci of free intraperitoneal gas in a linear formation along the gastric fundus. No evidence of bowel wall thickening or dilatation. Appendix appears normal. Vascular/Lymphatic: No  abdominal aorta or iliac aneurysm. No abdominal, pelvic, or inguinal lymphadenopathy. Reproductive: Uterus and bilateral adnexa are unremarkable. Other: Interval decrease in now small volume simple free fluid ascites. Interval development of moderate volume free intraperitoneal gas. Musculoskeletal: Interval development of a nonspecific 0.8 cm soft tissue density within the left lateral anterior abdominal wall (2:48). Similar finding measuring 0.5 cm more superiorly (2:44). No suspicious lytic or blastic osseous lesions. No acute displaced fracture. IMPRESSION: 1. Suggestion of gastric fundus perforation with associated moderate pneumoperitoneum. 2. Interval decrease in small volume simple free fluid ascites within the pelvis. These results were called by telephone at the time of interpretation on 05/07/2020 at 10:03 pm to provider PA [facility] , who verbally acknowledged these results. Electronically Signed   By: Tish Frederickson M.D.   On: 05/07/2020 22:09   X-ray abdomen AP  Result Date: 05/08/2020 CLINICAL DATA:  Check gastric catheter placement EXAM: ABDOMEN - 1 VIEW COMPARISON:  None. FINDINGS: Gastric catheter extends into the stomach. Previously seen free air is less  well appreciated on this exam given the recent operative intervention. No other focal abnormality is noted. IMPRESSION: Gastric catheter within the stomach. Electronically Signed   By: Alcide CleverMark  Lukens M.D.   On: 05/08/2020 02:13   DG Chest Portable 1 View  Result Date: 05/07/2020 CLINICAL DATA:  Free air on recent CT examination. EXAM: PORTABLE CHEST 1 VIEW COMPARISON:  04/15/2020 FINDINGS: Cardiac shadow is stable. The lungs are well aerated bilaterally. No focal infiltrate or sizable effusion is seen. Free air is noted primarily beneath the right hemidiaphragm similar to that seen on recent CT examination. No bony abnormality is noted. IMPRESSION: Free air within the abdomen similar to that seen on recent CT. No other focal  abnormality is noted. Electronically Signed   By: Alcide CleverMark  Lukens M.D.   On: 05/07/2020 23:06        Scheduled Meds: . Chlorhexidine Gluconate Cloth  6 each Topical Daily  . [START ON 05/09/2020] enoxaparin (LOVENOX) injection  40 mg Subcutaneous Q24H  . feeding supplement  1 Container Oral TID BM  . fentaNYL      . lip balm  1 application Topical BID  . [START ON 05/09/2020] pantoprazole (PROTONIX) IV  40 mg Intravenous Q24H   Continuous Infusions: . sodium chloride    . lactated ringers 50 mL/hr at 05/08/20 0600  . methocarbamol (ROBAXIN) IV    . ondansetron (ZOFRAN) IV       LOS: 0 days     Jacquelin Hawkingalph Telly Jawad, MD [facility] 05/08/2020, 1:41 PM  If 7PM-7AM, please contact night-coverage [email or web address]

## 2020-05-08 NOTE — Anesthesia Procedure Notes (Signed)
Procedure Name: MAC Date/Time: 05/07/2020 11:58 PM Performed by: Montel Clock, CRNA Pre-anesthesia Checklist: Patient identified, Emergency Drugs available, Suction available, Patient being monitored and Timeout performed Patient Re-evaluated:Patient Re-evaluated prior to induction Oxygen Delivery Method: Circle system utilized Preoxygenation: Pre-oxygenation with 100% oxygen Induction Type: IV induction and Rapid sequence Laryngoscope Size: Mac and 3 Grade View: Grade I Tube type: Oral Tube size: 7.0 mm Number of attempts: 1 Airway Equipment and Method: Stylet Secured at: 21 cm Tube secured with: Tape Dental Injury: Teeth and Oropharynx as per pre-operative assessment

## 2020-05-08 NOTE — Op Note (Signed)
PATIENT:  Kim Pacheco  34 y.o. female  Patient Care Team: [name], DO as PCP - General (Family Medicine) Rachael Fee, MD as Attending Physician (Gastroenterology) Clifton James Roselie Awkward, MD as Referring Physician (Obstetrics and Gynecology) Sedalia Muta, PT as Physical Therapist (Physical Therapy) Olivia Mackie, MD as Consulting Physician (Obstetrics and Gynecology) Annamarie Major, CRNP as Nurse Practitioner (Gastroenterology) Oretha Milch, MD as Consulting Physician (Pulmonary Disease)  PRE-OPERATIVE DIAGNOSIS:  Pneumoperitoneum  POST-OPERATIVE DIAGNOSIS:  Pneumoperitoneum of uncertain etiology  PROCEDURE:  DIAGNOSTIC LAPAROSCOPY with lysis of adhesions  SURGEON:  Ardeth Sportsman, MD  ANESTHESIA:   local and general  EBL:  Total I/O In: 2100 [I.V.:2000; IV Piggyback:100] Out: 25 [Blood:25]  Delay start of Pharmacological VTE agent (>24hrs) due to surgical blood loss or risk of bleeding:  no  DRAINS: none   SPECIMEN:  none  DISPOSITION OF SPECIMEN:  n/a  COUNTS:  YES  PLAN OF CARE: Admit to inpatient   PATIENT DISPOSITION:  PACU - hemodynamically stable.   INDICATIONS:  Young Asian woman with known chronic hepatitis B recently admitted for ascites and probable hepatitis flare.  Borderline cirrhosis/portal hypertension.  Kidney failure most likely related to urinary retention improved with Foley catheter.  History of chronic inactive gastritis on endoscopy last year.  H/o Negative Dx lap 08/2018 by her Gyn.  Chronic constipation.  Chronic abdominal pain.  Noted worsening abdominal pain came to the emergency room.  No fever or leukocytosis but with abdominal pain CT scan performed.  Moderate volume of upper abdominal pneumoperitoneum noted.  Gastric perforation suspected.  Patient with abdominal discomfort but not in shock.  Recommendation made for emergent laparoscopic possible open abdominal exploration  The anatomy & physiology of the digestive  tract was discussed.  The pathophysiology of perforation was discussed.  Differential diagnosis such as perforated ulcer or colon, etc was discussed.   Natural history risks without surgery such as death was discussed.  I recommended abdominal exploration to diagnose & treat the source of the problem.  Laparoscopic & open techniques were discussed.   Risks such as bleeding, infection, abscess, leak, reoperation, bowel resection, possible ostomy, injury to other organs, need for repair of tissues / organs, hernia, heart attack, death, and other risks were discussed.   The risks of no intervention will lead to serious problems including death.   I expressed a good likelihood that surgery will address the problem.    Goals of post-operative recovery were discussed as well.  We will work to minimize complications although risks in an emergent setting are high.   Questions were answered.  The patient expressed understanding & wishes to proceed with surgery.       OR FINDINGS: No evidence of peritonitis.  No evidence of perforation.  Physiological fluid in cul-de-sac but no ascites.  Liver slightly enlarged but soft & mobile c/w fatty change but no macronodular cirrhosis.  No intraperitoneal evidence of portal hypertension.  No gastric varices.  No cholecystitis or appendicitis.  1 omental and 1 small bowel adhesion to right fallopian tube without obstruction or internal hernia freed off.   Normal uterus and ovaries and fallopian tubes.  No evidence of ovarian cyst.  No endometriosis.  No evidence of any inguinal or other hernias.  No abnormal mucin or masses.  No visceral or parietal nodularity suspicious for carcinomatosis.    No evidence of any colitis nor diverticulitis.  No hiatal hernia.  Negative air leak on NGT insufflation  = no  evidence of any gastric leak/perforation.  CASE DATA:  Type of patient?: LDOW CASE (Surgical Hospitalist [facility] Inpatient)  Status of Case? EMERGENT Add On  Infection  Present At Time Of Surgery (PATOS)?  NO  DESCRIPTION:   Informed consent was confirmed.  The patient underwent general anaesthesia without difficulty. Nasogastric tube placed without incident and minimal gastric content return. Patient already had Foley catheter from her history of urinary retention. The patient was positioned appropriately.  VTE prevention in place.  The patient's abdomen was clipped, prepped, & draped in a sterile fashion.  Surgical timeout confirmed our plan.  Peritoneal entry with a laparoscopic port was obtained using optical entry technique in the left upper abdomen as the patient was positioned in reverse Trendelenburg.  Entry was clean.  I induced carbon dioxide insufflation.  Camera inspection revealed no injury.  Extra ports were carefully placed under direct laparoscopic visualization.  On inspection there was no evidence of any peritonitis. There was no ascites. Liver was soft and mobile consistent with fatty change in definitely no macronodular cirrhosis.  No portal hypertension. I began laparoscopic inspection. Since the pneumoperitoneum seemed to be in the upper abdomen and gastric perforation suspected I looked in the left upper quadrant. The stomach was decompressed with NG tube in place. No evidence of any irritation. I could follow the anterior stomach and find the pylorus duodenal bulb and duodenal sweep. No evidence of any perforation or ulcer or inflammation. Gallbladder thin and blue without cholecystitis. Inspection the rest the abdomen noted an omental adhesion and small bowel adhesion to the right fallopian tube which I freed up with cold scissors. Inspected the small bowel and saw no perforation there nor any irritation. No obvious peritonitis in lower abdomen.  I turned attention back to the stomach. It was somewhat enlarged but was well decompressed and did not seem to have any retained food or other abnormality. Certainly no gastric wall masses concerning for  GIST tumor or perforated ulcer or cancer. On the anterior surface I proceeded to free the greater omentum off the greater curvature to get into the lesser sac. Got into that easily. Encountered no fluid or perforation or other abnormality. I freed off attachments on the lesser curvature stomach and fundus all the way up to the left crus. I rolled the posterior gastric wall and fundus off the retroperitoneum and left crus.   Posterior gastric wall from fundus to pylorus underwhelming. No perforation. No irritation. No abnormality. Short gastric vessels were not particularly enlarged, arguing against any gastric varices. No hiatal hernia. No peritonitis. No evidence of any gastric perforation or other abnormality.  I turned attention to inspecting the rest of the abdomen. Repositioned the patient. Found the ileocecal region which was noninflamed. Patient had normal-appearing uninflamed appendix without any peritonitis or appendicitis. No terminal ileitis or Crohn's. I ran the small bowel proximally to the ligament of Treitz. No Meckel's diverticulum. No interloop adhesions. Again solitary adhesion band at the distal ileum without perforation or other abnormality. No small bowel perforation. No thickening. No mesenteric nodularity.  No lymphadenopathy. No carcinoid tumor.  I inspected the colon from the cecum, ascending colon, transverse colon. Took special attention to the splenic flexure of the colon and mobilized the greater omentum and the anterior splenic flexure little as well to be sure to inspect the colon well. Again no perforation or irritation there. Continued distally. Descending colon normal. Sigmoid colon with natural sweep without any diverticulitis or perforation or abnormality. Colon soft and decompressed.  No evidence of any retained stool concerning for severe colonic inertia/constipation. Scant clear serous fluid in the cul-de-sac. Not consistent with any ascites. Too small really to sample.  Aspiration done.  I looked at the adnexae. Uterus appeared normal without fibroids. Fallopian tubes without any abnormality. Ovaries intact without any mass or cyst. No mucin. No endometriosis noted. No nodularities. No visceral or peritoneal masses nor peritonitis.  I reinspected the abdomen and saw no abnormalities. I found the ligament of Treitz and clamped it. I asked anesthesia to insufflate the nasogastric tube with oxygen with the patient in Trendelenburg and and fluid. The stomach insufflated quite well. There was no leak gas or bubbles. This argued against any missed perforation or other abnormality on the stomach or duodenum.  Again no hiatal hernia. Stomach decompressed well.  I reinspected the abdomen one more time and saw no abnormality or perforation or any explanation for pneumoperitoneum. I had reviewed the CAT scan intraoperatively. There was no evidence of any pneumomediastinum or pneumothorax to argue for an esophageal perforation. Despite the concern for possible early fibrosis on liver biopsy, I did not see any strong evidence of that. Looks like fatty change of the liver without any fibrosis.  No ascites.  Held off on any repeat liver biopsy.  I decided to leave the nasogastric tube in place for now.  I aspirated the carbon dioxide.  I removed the ports.  I closed skin using 4-0 monocryl stitch.  Sterile dressings applied.  Patient was extubated and sent to the recovery room. Patient was not in shock nor needed any pressors or resuscitation. We will do NG tube clamping trial with liquids. If she tolerates without any pain or discomfort remove and advance diet. Consider regular fiber bowel regimen for her chronic constipation continue with medicine consultation. May see gastroenterology. I made an attempt to locate family to discuss patient's status and recommendations. No answer on husband's cell phone. No one is available at this time.  We will try again later.      Ardeth Sportsman,  M.D., F.A.C.S. Gastrointestinal and Minimally Invasive Surgery [facility] [address] [place], [place] [address] [phone] Main / Paging  05/08/2020 1:26 AM

## 2020-05-08 NOTE — Consult Note (Signed)
Referring Provider: Dr. Michaell Cowing, CCS            Primary Care Physician:  [name], DO Primary Gastroenterologist: Christella Hartigan ([facility]), also sees [facility]            Reason for Consultation: question of gastric perforation, abnl noncontrast CT abd, neg diagnostic laparoscopy earlier this morning                  ASSESSMENT /  PLAN   34 y.o. female with a past medical history of chronic hepatitis B (mildly active chronic hepatitis grade 2 of 4; mild fibrosis stage 1-2 of 4 by biopsy 04/18/2020) previously treated with tenofovir but no longer on antiviral treatment, history of ascites (SAAG > 1.1) due to portal hypertension with portosystemic gradient of 13 at time of recent liver biopsy, history of alcohol abuse, history of urinary retention being followed by urology with recent Foley catheter in place presenting to the ER with lower abdominal pain found to have pneumatosis in the proximal stomach and concern for gastric perforation now POD #0 after negative laparoscopy with Dr. Michaell Cowing.  1.  Abnormal CT scan stomach/question of gastric perforation --negative diagnostic laparoscopy with Dr. Michaell Cowing earlier this morning.  Her pain prior to presentation was bilateral lower abdomen and not epigastric as I would have expected.  Clearly her CT scan was abnormal and to this point without specific etiology.  Upper endoscopy 21 months ago rather unrevealing.  Abdomen exam is currently benign. --Given presentation, negative laparoscopy and no clear etiology for abnormal CT proceed to upper endoscopy is reasonable.  I discussed this with the patient including the risk, benefits and alternatives and she is agreeable and wishes to proceed. --Timing for EGD will be per Dr. Meridee Score and either tomorrow or Wednesday with monitored anesthesia care --Continue IV PPI every 24 hours for now --Clear liquid diet per general surgery  2.  Chronic hepatitis B with portal hypertension without cirrhosis --recent  thorough evaluation during hospitalization earlier this month reveals chronic active hepatitis B with portal hypertension as documented by portosystemic gradient performed with transjugular liver biopsy.  However biopsy does not reveal cirrhosis.  Her only manifestation of portal hypertension has been ascites.  Platelets, INR and albumin are all normal as one would expect without cirrhosis. --Defer management of this issue to [facility] as an outpatient --As diet is advanced target low-sodium --Can resume low-dose diuretics in the days to come --Hepatitis B management per liver clinic as an outpatient  3.  Bilateral lower abdominal pain --intermittent.  No etiology by CT scan.  Could be related to ongoing urologic issues.  No evidence for significant ascites today to warrant consideration of SBP.  Previous larger volume ascites could cause abdominal pain but ascites is overall decreased compared to several weeks ago.  Could consider trial of an antispasmodic such as dicyclomine though I am not convinced that her bilateral lower abdominal pain is GI in nature  HPI:     Kim Pacheco is a 34 y.o. female with a past medical history of chronic hepatitis B (mildly active chronic hepatitis grade 2 of 4; mild fibrosis stage 1-2 of 4 by biopsy 04/18/2020) previously treated with tenofovir but no longer on antiviral treatment, history of ascites (SAAG > 1.1) due to portal hypertension with portosystemic gradient of 13 at time of recent liver biopsy, history of alcohol abuse, history of urinary retention being followed by urology with recent Foley catheter in place presenting  to the ER with lower abdominal pain found to have pneumatosis in the proximal stomach and concern for gastric perforation now POD #0 after negative laparoscopy with Dr. Michaell Cowing.  She reports that at home she developed bilateral lower abdominal pain which became more severe than usual.  This is a pain that she has dealt with  intermittently.  This can double her over as it did yesterday.  Normally she will use a heating pad and very occasionally use oxycodone.  This did not improve pain and thus she came to the ER.  She also had noticed about 12 to 24 hours of upper abdominal pain in the epigastrium under her rib cage bilaterally and also substernally.  She had not had vomiting at home though did vomit one time after oral contrast before CT scan.  She denies constipation though has used laxatives in the past.  Her normal bowel movements occur every 3 to 4 days.  She attributes this to not eating enough fiber in her diet.  She was hospitalized earlier this month and seen by the GI consult service with ascites and acute kidney injury.  Work-up at that time did reveal portal hypertension but without cirrhosis by biopsy.  The inpatient GI consult service recommended low sodium diet and low-dose diuretics.  They also recommended she follow-up with [facility] after discharge.  On presentation this admission her CBC and CMP were normal.  CT scan was performed of the abdomen pelvis without IV contrast and suggested gastric fundus perforation with associated moderate pneumoperitoneum.  There was interval decrease in small volume simple free fluid ascites in the pelvis.  As mentioned above she went to the operating room with Dr. Michaell Cowing and a diagnostic laparoscopy was normal and negative for perforation.  She reports that she has recently stopped drinking alcohol.  She was drinking upwards of 1 bottle of wine per night.  She states that with her [place] background it was customary to drink wine in the evenings with meals.  She is married with 2 children age 90 and 84 years old.  She works as a Environmental manager.  Of note she had prior upper endoscopy and colonoscopy with Dr. Christella Hartigan in 2020 EGD February 2020 --normal with the exception of mild antral erythema and granularity.  Biopsies showed chronic inactive gastritis without H.  Pylori Colonoscopy July 2020 --normal to the terminal ileum with good prep   Past Medical History:  Diagnosis Date  . Abdominal pain    started in mid abd and spread to left side/on and off/since Jan 2020/ pt states, she has fluid in abd/unknown  . Abnormal Pap smear    normal 06/2013  . Anxiety   . Constipation    hx of  . Depression    suicide attempt in college  . Diabetes mellitus without complication (HCC)    gestational, no issues since pregnancy per her report - resolved  . Gestational diabetes    /no meds now - resolved  . Hepatitis    Hep B, managed by Dr. Kinnie Scales  . History of chicken pox   . History of suicide attempt 2003   Attempt in college  . History of UTI   . Vaginal Pap smear, abnormal     Past Surgical History:  Procedure Laterality Date  . CESAREAN SECTION N/A 01/26/2013   Procedure: Primary Cesarean Section Delivery Baby  Boy @ 0017, Apgars 4/8/9;  Surgeon: Freddrick March. Tenny Craw, MD;  Location: [facility];  Service: Obstetrics;  Laterality: N/A;  .  CESAREAN SECTION N/A 11/18/2017   Procedure: CESAREAN SECTION;  Surgeon: Olivia Mackie, MD;  [facility];  Service: Obstetrics;  Laterality: N/A;  . COLONOSCOPY  2020  . DIAGNOSTIC LAPAROSCOPY  08/25/2018   Dr Clifton James with Ob/Gyn3/2020  . ESOPHAGOGASTRODUODENOSCOPY ENDOSCOPY    . IR TRANSCATHETER BX  04/18/2020  . IR US GUIDE VASC ACCESS RIGHT  04/18/2020  . IR VENOGRAM HEPATIC W HEMODYNAMIC EVALUATION  04/18/2020  . NASAL SINUS SURGERY  1996  . WISDOM TOOTH EXTRACTION      Prior to Admission medications   Medication Sig Start Date End Date Taking? Authorizing Provider  docusate sodium (COLACE) 100 MG capsule Take 1 capsule (100 mg total) by mouth 2 (two) times daily. 04/12/20  Yes Koberlein, Junell C, MD  furosemide (LASIX) 20 MG tablet Take 1 tablet (20 mg total) by mouth daily. 04/20/20  Yes Gherghe, Daylene Katayama, MD  oxyCODONE (OXY IR/ROXICODONE) 5 MG immediate release tablet Take 1 tablet (5 mg total)  by mouth every 12 (twelve) hours as needed for severe pain. 05/02/20  Yes Koberlein, Paris Lore, MD  spironolactone (ALDACTONE) 25 MG tablet Take 1 tablet (25 mg total) by mouth daily. 04/20/20  Yes Gherghe, Daylene Katayama, MD  ondansetron (ZOFRAN ODT) 4 MG disintegrating tablet Take 1 tablet (4 mg total) by mouth every 8 (eight) hours as needed for nausea or vomiting. 04/12/20   Wynn Banker, MD  polyethylene glycol (GOLYTELY) 236 g solution Drink 8 oz solution every 1-2 hours as needed for constipation 04/12/20   Wynn Banker, MD    Current Facility-Administered Medications  Medication Dose Route Frequency Provider Last Rate Last Admin  . 0.9 %  sodium chloride infusion   Intravenous Q8H PRN Karie Soda, MD      . acetaminophen (TYLENOL) tablet 650 mg  650 mg Oral Q6H PRN Juliet Rude, PA-C      . alum & mag hydroxide-simeth (MAALOX/MYLANTA) 200-200-20 MG/5ML suspension 30 mL  30 mL Oral Q6H PRN Karie Soda, MD      . cefoTEtan (CEFOTAN) 2 g in sodium chloride 0.9 % 100 mL IVPB  2 g Intravenous Catha Gosselin, MD      . Chlorhexidine Gluconate Cloth 2 % PADS 6 each  6 each Topical Daily Karie Soda, MD      . diphenhydrAMINE (BENADRYL) injection 12.5-25 mg  12.5-25 mg Intravenous Q6H PRN Karie Soda, MD      . Melene Muller ON 05/09/2020] enoxaparin (LOVENOX) injection 40 mg  40 mg Subcutaneous Q24H Karie Soda, MD      . feeding supplement (BOOST / RESOURCE BREEZE) liquid 1 Container  1 Container Oral TID BM Karie Soda, MD   1 Container at 05/08/20 1010  . fentaNYL (SUBLIMAZE) 100 MCG/2ML injection           . HYDROmorphone (DILAUDID) injection 0.5-2 mg  0.5-2 mg Intravenous Q2H PRN Karie Soda, MD      . lactated ringers infusion   Intravenous Continuous Karie Soda, MD 50 mL/hr at 05/08/20 0600 Rate Verify at 05/08/20 0600  . lip balm (CARMEX) ointment 1 application  1 application Topical BID Karie Soda, MD      . menthol-cetylpyridinium (CEPACOL) lozenge 3 mg  1  lozenge Oral PRN Karie Soda, MD      . methocarbamol (ROBAXIN) 1,000 mg in dextrose 5 % 100 mL IVPB  1,000 mg Intravenous Q6H PRN Karie Soda, MD      . metoprolol tartrate (LOPRESSOR) injection 5 mg  5 mg Intravenous Q6H PRN Karie SodaGross, Steven, MD      . ondansetron [facility](ZOFRAN) injection 4 mg  4 mg Intravenous Q6H PRN Karie SodaGross, Steven, MD       Or  . ondansetron (ZOFRAN) 8 mg in sodium chloride 0.9 % 50 mL IVPB  8 mg Intravenous Q6H PRN Karie SodaGross, Steven, MD      . oxyCODONE (Oxy IR/ROXICODONE) immediate release tablet 5 mg  5 mg Oral Q4H PRN Juliet RudeJohnson, Kelly R, PA-C      . [START ON 05/09/2020] pantoprazole (PROTONIX) injection 40 mg  40 mg Intravenous Q24H Karie SodaGross, Steven, MD      . phenol (CHLORASEPTIC) mouth spray 2 spray  2 spray Mouth/Throat PRN Karie SodaGross, Steven, MD   2 spray at 05/08/20 0355  . prochlorperazine (COMPAZINE) injection 5-10 mg  5-10 mg Intravenous Q4H PRN Karie SodaGross, Steven, MD      . simethicone (MYLICON) 40 MG/0.6ML suspension 40 mg  40 mg Oral QID PRN Karie SodaGross, Steven, MD        Allergies as of 05/07/2020 - Review Complete 05/07/2020  Allergen Reaction Noted  . Contrast media [iodinated diagnostic agents] Anaphylaxis, Shortness Of Breath, and Rash 06/26/2018  . Nsaids Other (See Comments) 05/07/2020    Family History  Adopted: Yes  Family history unknown: Yes    Social History   Tobacco Use  . Smoking status: Former Smoker    Packs/day: 1.00    Types: Cigarettes    Quit date: 07/23/2011    Years since quitting: 8.8  . Smokeless tobacco: Never Used  . Tobacco comment: smoked for 8 years, quit in 2013  Vaping Use  . Vaping Use: Never used  Substance Use Topics  . Alcohol use: Yes    Alcohol/week: 5.0 standard drinks    Types: 5 Glasses of wine per week    Comment: h/o binge drinking - more abstinent 2021-  . Drug use: Not Currently    Types: Marijuana    Comment: Last use 12/19/18    Review of Systems: All systems reviewed and negative except where noted in HPI.  Physical  Exam: Vital signs in last 24 hours: Temp:  [97.5 F (36.4 C)-98.3 F (36.8 C)] 98.1 F (36.7 C) (11/29 0604) Pulse Rate:  [56-91] 74 (11/29 0642) Resp:  [10-21] 14 (11/29 0642) BP: (96-130)/(58-95) 100/62 (11/29 0642) SpO2:  [95 %-100 %] 98 % (11/29 0642) FiO2 (%):  [24 %] 24 % (11/29 0251) Weight:  [58 kg] 58 kg (11/29 0321) Last BM Date: 05/07/20 General:   Awake, alert, NAD Psych:  Pleasant, cooperative. Normal mood and affect. Eyes:  Pupils equal, sclera clear, no icterus.    Neck:  Supple; no masses Lungs:  Clear throughout to auscultation.   No wheezes, crackles, or rhonchi.  Heart:  Regular rate and rhythm; no murmurs, no lower extremity edema Abdomen:  Soft, non-distended, mild tenderness in the lower mid abdomen without rebound or guarding, bowel sounds are present, no appreciable ascites Rectal:  Deferred  Msk:  Symmetrical without gross deformities.  Neurologic:  Alert and  oriented x4;  grossly normal neurologically.  No asterixis Skin:  Intact without significant lesions or rashes.   Intake/Output from previous day: 11/28 0701 - 11/29 0700 In: 2353.9 [I.V.:2203.9; IV Piggyback:150] Out: 625 [Urine:600; Blood:25] Intake/Output this shift: Total I/O In: -  Out: 200 [Urine:200]  Lab Results: Recent Labs    05/07/20 1721 05/07/20 2220 05/08/20 0319  WBC 7.9 8.1 6.0  HGB 14.0 12.7 12.9  HCT 40.8 37.7  39.2  PLT 276 249 212   BMET Recent Labs    05/07/20 1721  NA 135  K 3.6  CL 101  CO2 24  GLUCOSE 92  BUN 16  CREATININE 0.92  CALCIUM 9.0   LFT Recent Labs    05/07/20 1721  PROT 8.5*  ALBUMIN 4.5  AST 30  ALT 38  ALKPHOS 54  BILITOT 0.9   PT/INR Recent Labs    05/07/20 1721  LABPROT 12.8  INR 1.0   Hepatitis Panel No results for input(s): HEPBSAG, HCVAB, HEPAIGM, HEPBIGM in the last 72 hours.   . CBC Latest Ref Rng & Units 05/08/2020 05/07/2020 05/07/2020  WBC 4.0 - 10.5 K/uL 6.0 8.1 7.9  Hemoglobin 12.0 - 15.0 g/dL 16.1 09.6  04.5  Hematocrit 36 - 46 % 39.2 37.7 40.8  Platelets 150 - 400 K/uL 212 249 276    . CMP Latest Ref Rng & Units 05/07/2020 04/19/2020 04/18/2020  Glucose 70 - 99 mg/dL 92 409(W) 95  BUN 6 - 20 mg/dL 16 5(L) 7  Creatinine 1.19 - 1.00 mg/dL 1.47 8.29 5.62  Sodium 135 - 145 mmol/L 135 135 138  Potassium 3.5 - 5.1 mmol/L 3.6 4.2 3.2(L)  Chloride 98 - 111 mmol/L 101 104 105  CO2 22 - 32 mmol/L Calcium 8.9 - 10.3 mg/dL 9.0 1.3(Y) 8.6(V)  Total Protein 6.5 - 8.1 g/dL 7.8(I) 7.0 6.9(G)  Total Bilirubin 0.3 - 1.2 mg/dL 0.9 0.5 0.8  Alkaline Phos 38 - 126 U/L 54 43 37(L)  AST 15 - 41 U/L 30 142(H) 19  ALT 0 - 44 U/L 38 120(H) 17   Studies/Results: CT Abdomen Pelvis Wo Contrast  Result Date: 05/07/2020 CLINICAL DATA:  Abdominal pain nonlocalized. EXAM: CT ABDOMEN AND PELVIS WITHOUT CONTRAST TECHNIQUE: Multidetector CT imaging of the abdomen and pelvis was performed following the standard protocol without IV contrast. COMPARISON:  CT abdomen pelvis 04/13/2020 FINDINGS: Lower chest: No acute abnormality. Hepatobiliary: No focal liver abnormality. No gallstones, gallbladder wall thickening, or pericholecystic fluid. No biliary dilatation. Pancreas: No focal lesion. Normal pancreatic contour. No surrounding inflammatory changes. No main pancreatic ductal dilatation. Spleen: Normal in size without focal abnormality. Adrenals/Urinary Tract: No adrenal nodule bilaterally. Bilateral kidneys enhance symmetrically. No hydronephrosis. No hydroureter. The urinary bladder is decompressed with a urinary Foley catheter terminating within its lumen. Stomach/Bowel: Trace PO contrast noted within the lumen of the stomach. Gastric wall pneumatosis with several foci of free intraperitoneal gas in a linear formation along the gastric fundus. No evidence of bowel wall thickening or dilatation. Appendix appears normal. Vascular/Lymphatic: No abdominal aorta or iliac aneurysm. No abdominal, pelvic, or inguinal  lymphadenopathy. Reproductive: Uterus and bilateral adnexa are unremarkable. Other: Interval decrease in now small volume simple free fluid ascites. Interval development of moderate volume free intraperitoneal gas. Musculoskeletal: Interval development of a nonspecific 0.8 cm soft tissue density within the left lateral anterior abdominal wall (2:48). Similar finding measuring 0.5 cm more superiorly (2:44). No suspicious lytic or blastic osseous lesions. No acute displaced fracture. IMPRESSION: 1. Suggestion of gastric fundus perforation with associated moderate pneumoperitoneum. 2. Interval decrease in small volume simple free fluid ascites within the pelvis. These results were called by telephone at the time of interpretation on 05/07/2020 at 10:03 pm to provider PA [facility] , who verbally acknowledged these results. Electronically Signed   By: Tish Frederickson M.D.   On: 05/07/2020 22:09   X-ray abdomen AP  Result Date: 05/08/2020 CLINICAL DATA:  Check gastric catheter placement EXAM: ABDOMEN - 1 VIEW COMPARISON:  None. FINDINGS: Gastric catheter extends into the stomach. Previously seen free air is less well appreciated on this exam given the recent operative intervention. No other focal abnormality is noted. IMPRESSION: Gastric catheter within the stomach. Electronically Signed   By: Alcide Clever M.D.   On: 05/08/2020 02:13   DG Chest Portable 1 View  Result Date: 05/07/2020 CLINICAL DATA:  Free air on recent CT examination. EXAM: PORTABLE CHEST 1 VIEW COMPARISON:  04/15/2020 FINDINGS: Cardiac shadow is stable. The lungs are well aerated bilaterally. No focal infiltrate or sizable effusion is seen. Free air is noted primarily beneath the right hemidiaphragm similar to that seen on recent CT examination. No bony abnormality is noted. IMPRESSION: Free air within the abdomen similar to that seen on recent CT. No other focal abnormality is noted. Electronically Signed   By: Alcide Clever M.D.   On:  05/07/2020 23:06    Principal Problem:   Pneumoperitoneum Active Problems:   Chronic active viral hepatitis B (HCC)   Constipation, chronic   Ascites   Hepatic steatosis   Foley catheter in place   Chronic gastritis   History of urinary retention   History of gestational diabetes   History of depression   Free intraperitoneal air    Carie Caddy. Chuckie Mccathern, M.D. @  05/08/2020, 10:53 AM

## 2020-05-08 NOTE — Transfer of Care (Signed)
Immediate Anesthesia Transfer of Care Note  Patient: Kim Pacheco  Procedure(s) Performed: DIAGNOSTIC LAPAROSCOPIC (N/A Abdomen)  Patient Location: PACU  Anesthesia Type:General  Level of Consciousness: drowsy and patient cooperative  Airway & Oxygen Therapy: Patient Spontanous Breathing and Patient connected to face mask oxygen  Post-op Assessment: Report given to RN and Post -op Vital signs reviewed and stable  Post vital signs: Reviewed and stable  Last Vitals:  Vitals Value Taken Time  BP 116/66 05/08/20 0141  Temp    Pulse 86 05/08/20 0144  Resp 15 05/08/20 0144  SpO2 100 % 05/08/20 0144  Vitals shown include unvalidated device data.  Last Pain:  Vitals:   05/07/20 1830  TempSrc:   PainSc: 6          Complications: No complications documented.

## 2020-05-09 ENCOUNTER — Encounter: Payer: BC Managed Care – PPO | Admitting: Physical Therapy

## 2020-05-09 DIAGNOSIS — R1030 Lower abdominal pain, unspecified: Secondary | ICD-10-CM

## 2020-05-09 DIAGNOSIS — B181 Chronic viral hepatitis B without delta-agent: Secondary | ICD-10-CM | POA: Diagnosis not present

## 2020-05-09 DIAGNOSIS — R933 Abnormal findings on diagnostic imaging of other parts of digestive tract: Secondary | ICD-10-CM | POA: Diagnosis not present

## 2020-05-09 DIAGNOSIS — K769 Liver disease, unspecified: Secondary | ICD-10-CM | POA: Diagnosis not present

## 2020-05-09 DIAGNOSIS — K668 Other specified disorders of peritoneum: Secondary | ICD-10-CM | POA: Diagnosis not present

## 2020-05-09 DIAGNOSIS — K2920 Alcoholic gastritis without bleeding: Secondary | ICD-10-CM | POA: Diagnosis not present

## 2020-05-09 DIAGNOSIS — K5909 Other constipation: Secondary | ICD-10-CM

## 2020-05-09 DIAGNOSIS — K760 Fatty (change of) liver, not elsewhere classified: Secondary | ICD-10-CM | POA: Diagnosis not present

## 2020-05-09 LAB — CBC
HCT: 33.4 % — ABNORMAL LOW (ref 36.0–46.0)
Hemoglobin: 11.1 g/dL — ABNORMAL LOW (ref 12.0–15.0)
MCH: 31 pg (ref 26.0–34.0)
MCHC: 33.2 g/dL (ref 30.0–36.0)
MCV: 93.3 fL (ref 80.0–100.0)
Platelets: 195 10*3/uL (ref 150–400)
RBC: 3.58 MIL/uL — ABNORMAL LOW (ref 3.87–5.11)
RDW: 12.1 % (ref 11.5–15.5)
WBC: 9.4 10*3/uL (ref 4.0–10.5)
nRBC: 0 % (ref 0.0–0.2)

## 2020-05-09 LAB — BASIC METABOLIC PANEL
Anion gap: 6 (ref 5–15)
BUN: 7 mg/dL (ref 6–20)
CO2: 25 mmol/L (ref 22–32)
Calcium: 8.3 mg/dL — ABNORMAL LOW (ref 8.9–10.3)
Chloride: 108 mmol/L (ref 98–111)
Creatinine, Ser: 0.77 mg/dL (ref 0.44–1.00)
GFR, Estimated: >60 mL/min (ref >60)
Glucose, Bld: 100 mg/dL — ABNORMAL HIGH (ref 70–99)
Potassium: 4 mmol/L (ref 3.5–5.1)
Sodium: 139 mmol/L (ref 135–145)

## 2020-05-09 LAB — URINE CULTURE: Culture: NO GROWTH

## 2020-05-09 LAB — CA 125: Cancer Antigen (CA) 125: 63.6 U/mL — ABNORMAL HIGH (ref 0.0–38.1)

## 2020-05-09 MED ORDER — ENSURE SURGERY PO LIQD
237.0000 mL | Freq: Two times a day (BID) | ORAL | Status: DC
  Administered 2020-05-11: 237 mL via ORAL

## 2020-05-09 MED ORDER — POLYETHYLENE GLYCOL 3350 17 G PO PACK
17.0000 g | PACK | Freq: Every day | ORAL | Status: DC
  Administered 2020-05-11: 17 g via ORAL
  Filled 2020-05-09: qty 1

## 2020-05-09 MED ORDER — FUROSEMIDE 20 MG PO TABS
20.0000 mg | ORAL_TABLET | Freq: Every day | ORAL | Status: DC
  Administered 2020-05-09 – 2020-05-12 (×4): 20 mg via ORAL
  Filled 2020-05-09 (×4): qty 1

## 2020-05-09 MED ORDER — SPIRONOLACTONE 25 MG PO TABS
25.0000 mg | ORAL_TABLET | Freq: Every day | ORAL | Status: DC
  Administered 2020-05-09 – 2020-05-12 (×4): 25 mg via ORAL
  Filled 2020-05-09 (×4): qty 1

## 2020-05-09 NOTE — Progress Notes (Signed)
Progress Note  Chief Complaint:    Question of gastric perforation     ASSESSMENT / PLAN:    # Pneumoperitoneum with suggestion of gastric fundus perforation on admission CT scan. Negative diagnostic laparoscopy yesterday with lysis of adhesions. No evidence of perforation nor peritonitis found. Of note patient does mention having an hour of epigastric pain on Saturday. She presented to ED on Sunday -Plan is for EGD tomorrow to further evaluate stomach. The risks and benefits of the procedure were already discussed with the patient.   # Bilateral lower abdominal pain. No etiology by CT scan. No significant ascites to suspect SBP. Question in Urologic related pain. She had similar pain recently after foley temporarily removed.  Urology following her outpatient for urinary retention of unclear etiology.   # Abnormal U/A with > 300 mg/ dl of protein, 00-76 WBC and some RBCs. Nitrite negative. Urine culture negative day 2. Serum albumin normal.   # Chronic HBV, no longer on antiviral treatment. Mildly active chronic hepatitis grade 2 of 4, mild fibrosis stage 1-2 of 4 by biopsy earlier this month.          SUBJECTIVE:   Sore from surgery but otherwise no abdominal pain     OBJECTIVE:    Scheduled inpatient medications:  . Chlorhexidine Gluconate Cloth  6 each Topical Daily  . enoxaparin (LOVENOX) injection  40 mg Subcutaneous Q24H  . feeding supplement  1 Container Oral TID BM  . feeding supplement  237 mL Oral BID BM  . furosemide  20 mg Oral Daily  . lip balm  1 application Topical BID  . pantoprazole (PROTONIX) IV  40 mg Intravenous Q24H  . polyethylene glycol  17 g Oral Daily  . spironolactone  25 mg Oral Daily   Continuous inpatient infusions:  . sodium chloride    . methocarbamol (ROBAXIN) IV    . ondansetron (ZOFRAN) IV     PRN inpatient medications: sodium chloride, acetaminophen, alum & mag hydroxide-simeth, diphenhydrAMINE, HYDROmorphone (DILAUDID) injection,  menthol-cetylpyridinium, methocarbamol (ROBAXIN) IV, metoprolol tartrate, ondansetron (ZOFRAN) IV **OR** ondansetron (ZOFRAN) IV, oxyCODONE, phenol, prochlorperazine, simethicone  Vital signs in last 24 hours: Temp:  [97.7 F (36.5 C)-98.2 F (36.8 C)] 98.2 F (36.8 C) (11/30 0509) Pulse Rate:  [55-62] 55 (11/30 0509) Resp:  [15-18] 15 (11/30 0509) BP: (100-108)/(49-66) 100/49 (11/30 0509) SpO2:  [97 %-100 %] 97 % (11/30 0509) Last BM Date: (P) 05/07/20  Intake/Output Summary (Last 24 hours) at 05/09/2020 1240 Last data filed at 05/09/2020 0600 Gross per 24 hour  Intake 1740 ml  Output 2550 ml  Net -810 ml     Physical Exam:  . General: Aler female in NAD . Heart:  Regular rate and rhythm. No lower extremity edema . Pulmonary: Normal respiratory effort . Abdomen: Soft, nondistended, sore from surgery. Normal bowel sounds.  . Neurologic: Alert and oriented . Psych: Pleasant. Cooperative.   Filed Weights   05/08/20 0321  Weight: 58 kg    Intake/Output from previous day: 11/29 0701 - 11/30 0700 In: 1980 [P.O.:1980] Out: 3150 [Urine:3150] Intake/Output this shift: No intake/output data recorded.    Lab Results: Recent Labs    05/07/20 2220 05/08/20 0319 05/09/20 0414  WBC 8.1 6.0 9.4  HGB 12.7 12.9 11.1*  HCT 37.7 39.2 33.4*  PLT 249 212 195   BMET Recent Labs    05/07/20 1721 05/09/20 0414  NA 135 139  K 3.6 4.0  CL 101 108  CO2 24  25  GLUCOSE 92 100*  BUN 16 7  CREATININE 0.92 0.77  CALCIUM 9.0 8.3*   LFT Recent Labs    05/07/20 1721  PROT 8.5*  ALBUMIN 4.5  AST 30  ALT 38  ALKPHOS 54  BILITOT 0.9   PT/INR Recent Labs    05/07/20 1721  LABPROT 12.8  INR 1.0   Hepatitis Panel No results for input(s): HEPBSAG, HCVAB, HEPAIGM, HEPBIGM in the last 72 hours.  CT Abdomen Pelvis Wo Contrast  Result Date: 05/07/2020 CLINICAL DATA:  Abdominal pain nonlocalized. EXAM: CT ABDOMEN AND PELVIS WITHOUT CONTRAST TECHNIQUE: Multidetector CT  imaging of the abdomen and pelvis was performed following the standard protocol without IV contrast. COMPARISON:  CT abdomen pelvis 04/13/2020 FINDINGS: Lower chest: No acute abnormality. Hepatobiliary: No focal liver abnormality. No gallstones, gallbladder wall thickening, or pericholecystic fluid. No biliary dilatation. Pancreas: No focal lesion. Normal pancreatic contour. No surrounding inflammatory changes. No main pancreatic ductal dilatation. Spleen: Normal in size without focal abnormality. Adrenals/Urinary Tract: No adrenal nodule bilaterally. Bilateral kidneys enhance symmetrically. No hydronephrosis. No hydroureter. The urinary bladder is decompressed with a urinary Foley catheter terminating within its lumen. Stomach/Bowel: Trace PO contrast noted within the lumen of the stomach. Gastric wall pneumatosis with several foci of free intraperitoneal gas in a linear formation along the gastric fundus. No evidence of bowel wall thickening or dilatation. Appendix appears normal. Vascular/Lymphatic: No abdominal aorta or iliac aneurysm. No abdominal, pelvic, or inguinal lymphadenopathy. Reproductive: Uterus and bilateral adnexa are unremarkable. Other: Interval decrease in now small volume simple free fluid ascites. Interval development of moderate volume free intraperitoneal gas. Musculoskeletal: Interval development of a nonspecific 0.8 cm soft tissue density within the left lateral anterior abdominal wall (2:48). Similar finding measuring 0.5 cm more superiorly (2:44). No suspicious lytic or blastic osseous lesions. No acute displaced fracture. IMPRESSION: 1. Suggestion of gastric fundus perforation with associated moderate pneumoperitoneum. 2. Interval decrease in small volume simple free fluid ascites within the pelvis. These results were called by telephone at the time of interpretation on 05/07/2020 at 10:03 pm to provider PA [facility] , who verbally acknowledged these results. Electronically  Signed   By: Tish Frederickson M.D.   On: 05/07/2020 22:09   X-ray abdomen AP  Result Date: 05/08/2020 CLINICAL DATA:  Check gastric catheter placement EXAM: ABDOMEN - 1 VIEW COMPARISON:  None. FINDINGS: Gastric catheter extends into the stomach. Previously seen free air is less well appreciated on this exam given the recent operative intervention. No other focal abnormality is noted. IMPRESSION: Gastric catheter within the stomach. Electronically Signed   By: Alcide Clever M.D.   On: 05/08/2020 02:13   DG Chest Portable 1 View  Result Date: 05/07/2020 CLINICAL DATA:  Free air on recent CT examination. EXAM: PORTABLE CHEST 1 VIEW COMPARISON:  04/15/2020 FINDINGS: Cardiac shadow is stable. The lungs are well aerated bilaterally. No focal infiltrate or sizable effusion is seen. Free air is noted primarily beneath the right hemidiaphragm similar to that seen on recent CT examination. No bony abnormality is noted. IMPRESSION: Free air within the abdomen similar to that seen on recent CT. No other focal abnormality is noted. Electronically Signed   By: Alcide Clever M.D.   On: 05/07/2020 23:06      Principal Problem:   Pneumoperitoneum Active Problems:   Chronic active viral hepatitis B (HCC)   Constipation, chronic   Ascites   Hepatic steatosis   Foley catheter in place   Chronic gastritis  History of urinary retention   History of gestational diabetes   History of depression   Free intraperitoneal air   Abnormal CT of the abdomen     LOS: 1 day   Willette Cluster ,NP 05/09/2020, 12:40 PM

## 2020-05-09 NOTE — Progress Notes (Addendum)
PROGRESS NOTE    Kim Pacheco  VZD:[id number] DOB: Jan 27, 1986 DOA: 05/07/2020 PCP: Wynn Banker, MD   Brief Narrative: Kim Pacheco is a 34 y.o. femalewith medical history significant of hepatitis B, ascites of unclear etiology, history of depression, history of constipation, alcohol abuse. Patient presented secondary to abdominal pain not relieved with heating pad and analgesics and found to have concern for pneumoperitoneum on CT scan. She underwent diagnostic laparoscopy by general surgery.   Assessment & Plan:   Principal Problem:   Pneumoperitoneum Active Problems:   Chronic active viral hepatitis B (HCC)   Constipation, chronic   Ascites   Hepatic steatosis   Foley catheter in place   Chronic gastritis   History of urinary retention   History of gestational diabetes   History of depression   Free intraperitoneal air   Abnormal CT of the abdomen   Pneumoperitoneum Initial concern for gastric perforation which was ruled out on diagnostic laparoscopy on 11/29. Pneumoperitoneum is of unknown etiology. -Per general surgery -General surgery planning EGD  Chronic hepatitis B Liver fibrosis Portal hypertension Patient followed by [facility] as mentioned above. Patient is on Lasix and spironolactone as an outpatient. Ascites not seen on laparoscopy. Fatty liver changes seen on laparoscopy.  Alcohol abuse Placed on CIWA on admission -Continue CIWA  History of gastritis -Continue Protonix  Urinary retention Present on admission. Patient already has a foley and plan for urology follow-up/management as an outpatient. -Continue foley  History of constipation Complicated by recent surgery. -Per primary  Elevated CA 125 Non-specific. Recent transvaginal ultrasound with normal adnexa.  Final recommendations: Patient to follow-up with OB/Gyn with regard to elevated CA 125, however, unlikely related to adnexal disease. Patient to  follow-up with urology with regard to urinary retention. [facility] will sign off. Please re-consult as needed.   DVT prophylaxis: Per primary Code Status:   Code Status: Full Code Family Communication: Husband at bedside Disposition Plan: Per primary    Procedures:   DIAGNOSTIC LAPAROSCOPY (05/08/2020)  Antimicrobials:  None    Subjective: Abdominal pain. Feels more similar to chronic pain than from recent surgery.  Objective: Vitals:   05/08/20 1646 05/08/20 2105 05/09/20 0509 05/09/20 1411  BP: 104/65 (!) 108/57 (!) 100/49 104/69  Pulse: (!) 59 62 (!) 55 63  Resp: 18 16 15 18   Temp:  98.1 F (36.7 C) 98.2 F (36.8 C) 99.2 F (37.3 C)  TempSrc:  Oral Oral   SpO2: 100% 99% 97% 100%  Weight:      Height:        Intake/Output Summary (Last 24 hours) at 05/09/2020 1650 Last data filed at 05/09/2020 1240 Gross per 24 hour  Intake 1020 ml  Output 3000 ml  Net -1980 ml   Filed Weights   05/08/20 0321  Weight: 58 kg    Examination:  General exam: Appears calm and comfortable Respiratory system: Clear to auscultation. Respiratory effort normal. Cardiovascular system: S1 & S2 heard, RRR. No murmurs, rubs, gallops or clicks. Gastrointestinal system: Abdomen is slightly distended, soft and somewhat tender. No organomegaly or masses felt. Normal bowel sounds heard. Central nervous system: Alert and oriented. No focal neurological deficits. Musculoskeletal: No edema. No calf tenderness Skin: No cyanosis. No rashes Psychiatry: Judgement and insight appear normal. Mood & affect appropriate.     Data Reviewed: I have personally reviewed following labs and imaging studies  CBC Lab Results  Component Value Date   WBC 9.4 05/09/2020   RBC 3.58 (L)  05/09/2020   HGB 11.1 (L) 05/09/2020   HCT 33.4 (L) 05/09/2020   MCV 93.3 05/09/2020   MCH 31.0 05/09/2020   PLT 195 05/09/2020   MCHC 33.2 05/09/2020   RDW 12.1 05/09/2020   LYMPHSABS 1.9 05/07/2020   MONOABS 0.6  05/07/2020   EOSABS 0.1 05/07/2020   BASOSABS 0.1 05/07/2020     Last metabolic panel Lab Results  Component Value Date   NA 139 05/09/2020   K 4.0 05/09/2020   CL 108 05/09/2020   CO2 25 05/09/2020   BUN 7 05/09/2020   CREATININE 0.77 05/09/2020   GLUCOSE 100 (H) 05/09/2020   GFRNONAA >60 05/09/2020   GFRAA >60 06/26/2018   CALCIUM 8.3 (L) 05/09/2020   PHOS 4.0 05/08/2020   PROT 8.5 (H) 05/07/2020   ALBUMIN 4.5 05/07/2020   BILITOT 0.9 05/07/2020   ALKPHOS 54 05/07/2020   AST 30 05/07/2020   ALT 38 05/07/2020   ANIONGAP 6 05/09/2020    CBG (last 3)  Recent Labs    05/08/20 0143  GLUCAP 128*     GFR: Estimated Creatinine Clearance: 75.4 mL/min (by C-G formula based on SCr of 0.77 mg/dL).  Coagulation Profile: Recent Labs  Lab 05/07/20 1721  INR 1.0    Recent Results (from the past 240 hour(s))  Resp Panel by RT-PCR (Flu A&B, Covid) Nasopharyngeal Swab     Status: None   Collection Time: 05/07/20  5:21 PM   Specimen: Nasopharyngeal Swab; Nasopharyngeal(NP) swabs in vial transport medium  Result Value Ref Range Status   SARS Coronavirus 2 by RT PCR NEGATIVE NEGATIVE Final    Comment: (NOTE) SARS-CoV-2 target nucleic acids are NOT DETECTED.  The SARS-CoV-2 RNA is generally detectable in upper respiratory specimens during the acute phase of infection. The lowest concentration of SARS-CoV-2 viral copies this assay can detect is 138 copies/mL. A negative result does not preclude SARS-Cov-2 infection and should not be used as the sole basis for treatment or other patient management decisions. A negative result may occur with  improper specimen collection/handling, submission of specimen other than nasopharyngeal swab, presence of viral mutation(s) within the areas targeted by this assay, and inadequate number of viral copies(<138 copies/mL). A negative result must be combined with clinical observations, patient history, and epidemiological information. The  expected result is Negative.  Fact Sheet for Patients:  BloggerCourse.com  Fact Sheet for Healthcare Providers:  SeriousBroker.it  This test is no t yet approved or cleared by the [place] FDA and  has been authorized for detection and/or diagnosis of SARS-CoV-2 by FDA under an Emergency Use Authorization (EUA). This EUA will remain  in effect (meaning this test can be used) for the duration of the COVID-19 declaration under Section 564(b)(1) of the Act, 21 U.S.C.section 360bbb-3(b)(1), unless the authorization is terminated  or revoked sooner.       Influenza A by PCR NEGATIVE NEGATIVE Final   Influenza B by PCR NEGATIVE NEGATIVE Final    Comment: (NOTE) The Xpert Xpress SARS-CoV-2/FLU/RSV plus assay is intended as an aid in the diagnosis of influenza from Nasopharyngeal swab specimens and should not be used as a sole basis for treatment. Nasal washings and aspirates are unacceptable for Xpert Xpress SARS-CoV-2/FLU/RSV testing.  Fact Sheet for Patients: BloggerCourse.com  Fact Sheet for Healthcare Providers: SeriousBroker.it  This test is not yet approved or cleared by the [place] FDA and has been authorized for detection and/or diagnosis of SARS-CoV-2 by FDA under an Emergency Use Authorization (EUA). This EUA  will remain in effect (meaning this test can be used) for the duration of the COVID-19 declaration under Section 564(b)(1) of the Act, 21 U.S.C. section 360bbb-3(b)(1), unless the authorization is terminated or revoked.  Performed at [facility], [address], [place], [place] [address]   Urine culture     Status: None   Collection Time: 05/07/20  6:52 PM   Specimen: Urine, Clean Catch  Result Value Ref Range Status   Specimen Description   Final    URINE, CLEAN CATCH Performed at [facility], [address], [place], [place] [address]    Special Requests   Final    NONE Performed at [facility], [address] [place], [place] [address]    Culture   Final    NO GROWTH Performed at [facility], [address], [place], [place] [address]    Report Status 05/09/2020 FINAL  Final  Culture, blood (routine x 2)     Status: None (Preliminary result)   Collection Time: 05/07/20 10:20 PM   Specimen: BLOOD  Result Value Ref Range Status   Specimen Description   Final    BLOOD RIGHT ANTECUBITAL Performed at [facility], [address] [place], [place] [address]    Special Requests   Final    BOTTLES DRAWN AEROBIC AND ANAEROBIC Blood Culture adequate volume Performed at [facility], [address], [place], [place] [address]    Culture   Final    NO GROWTH 1 DAY Performed at [facility], [address] [place], [place] [address]    Report Status PENDING  Incomplete  Culture, blood (routine x 2)     Status: None (Preliminary result)   Collection Time: 05/08/20  3:19 AM   Specimen: BLOOD  Result Value Ref Range Status   Specimen Description   Final    BLOOD BLOOD RIGHT HAND Performed at [facility], [address] [place], [place] [address]    Special Requests   Final    BOTTLES DRAWN AEROBIC ONLY Blood Culture results may not be optimal due to an inadequate volume of blood received in culture bottles Performed at [facility], [address] [place], [place] [address]    Culture   Final    NO GROWTH 1 DAY Performed at [facility], [address] [place], [place] [address]    Report Status PENDING  Incomplete        Radiology Studies: CT Abdomen Pelvis Wo Contrast  Result Date: 05/07/2020 CLINICAL DATA:  Abdominal pain nonlocalized. EXAM: CT ABDOMEN AND PELVIS WITHOUT CONTRAST TECHNIQUE: Multidetector CT imaging of the abdomen and pelvis was performed  following the standard protocol without IV contrast. COMPARISON:  CT abdomen pelvis 04/13/2020 FINDINGS: Lower chest: No acute abnormality. Hepatobiliary: No focal liver abnormality. No gallstones, gallbladder wall thickening, or pericholecystic fluid. No biliary dilatation. Pancreas: No focal lesion. Normal pancreatic contour. No surrounding inflammatory changes. No main pancreatic ductal dilatation. Spleen: Normal in size without focal abnormality. Adrenals/Urinary Tract: No adrenal nodule bilaterally. Bilateral kidneys enhance symmetrically. No hydronephrosis. No hydroureter. The urinary bladder is decompressed with a urinary Foley catheter terminating within its lumen. Stomach/Bowel: Trace PO contrast noted within the lumen of the stomach. Gastric wall pneumatosis with several foci of free intraperitoneal gas in a linear formation along the gastric fundus. No evidence of bowel wall thickening or dilatation. Appendix appears normal. Vascular/Lymphatic: No  abdominal aorta or iliac aneurysm. No abdominal, pelvic, or inguinal lymphadenopathy. Reproductive: Uterus and bilateral adnexa are unremarkable. Other: Interval decrease in now small volume simple free fluid ascites. Interval development of moderate volume free intraperitoneal gas. Musculoskeletal: Interval development of a nonspecific 0.8 cm soft tissue density within the left lateral anterior abdominal wall (2:48). Similar finding measuring 0.5 cm more superiorly (2:44). No suspicious lytic or blastic osseous lesions. No acute displaced fracture. IMPRESSION: 1. Suggestion of gastric fundus perforation with associated moderate pneumoperitoneum. 2. Interval decrease in small volume simple free fluid ascites within the pelvis. These results were called by telephone at the time of interpretation on 05/07/2020 at 10:03 pm to provider [facility] , who verbally acknowledged these results. Electronically Signed   By: Tish FredericksonMorgane  Naveau M.D.   On: 05/07/2020  22:09   X-ray abdomen AP  Result Date: 05/08/2020 CLINICAL DATA:  Check gastric catheter placement EXAM: ABDOMEN - 1 VIEW COMPARISON:  None. FINDINGS: Gastric catheter extends into the stomach. Previously seen free air is less well appreciated on this exam given the recent operative intervention. No other focal abnormality is noted. IMPRESSION: Gastric catheter within the stomach. Electronically Signed   By: Alcide CleverMark  Lukens M.D.   On: 05/08/2020 02:13   DG Chest Portable 1 View  Result Date: 05/07/2020 CLINICAL DATA:  Free air on recent CT examination. EXAM: PORTABLE CHEST 1 VIEW COMPARISON:  04/15/2020 FINDINGS: Cardiac shadow is stable. The lungs are well aerated bilaterally. No focal infiltrate or sizable effusion is seen. Free air is noted primarily beneath the right hemidiaphragm similar to that seen on recent CT examination. No bony abnormality is noted. IMPRESSION: Free air within the abdomen similar to that seen on recent CT. No other focal abnormality is noted. Electronically Signed   By: Alcide CleverMark  Lukens M.D.   On: 05/07/2020 23:06        Scheduled Meds: . Chlorhexidine Gluconate Cloth  6 each Topical Daily  . feeding supplement  1 Container Oral TID BM  . feeding supplement  237 mL Oral BID BM  . furosemide  20 mg Oral Daily  . lip balm  1 application Topical BID  . pantoprazole (PROTONIX) IV  40 mg Intravenous Q24H  . polyethylene glycol  17 g Oral Daily  . spironolactone  25 mg Oral Daily   Continuous Infusions: . sodium chloride    . methocarbamol (ROBAXIN) IV    . ondansetron (ZOFRAN) IV       LOS: 1 day     Jacquelin Hawkingalph Nicey Krah, MD [facility] 05/09/2020, 4:50 PM  If 7PM-7AM, please contact night-coverage [email or web address]

## 2020-05-09 NOTE — Progress Notes (Addendum)
2 Days Post-Op    CC: Chronic abdominal pain/constipation  Subjective: Patient is lying in bed looks fairly comfortable this AM.  She has been tolerating clear liquids.  Patient was seen yesterday by GI, and they are planning to do an EGD either today or tomorrow.  I have made her n.p.o. and she told her she had not eaten today.  She asked about Ca125.  She also told her she had seen her GI doctor last Tuesday, 05/02/2020.  She had been off her hepatitis B medication, and her physician at [facility] is withholding it until she quits drinking.  She said her last regular BM was on 05/03/2020.  She did have some loose stool on Sunday, 05/07/2020 before her surgery.  Objective: Vital signs in last 24 hours: Temp:  [97.7 F (36.5 C)-98.2 F (36.8 C)] 98.2 F (36.8 C) (11/30 0509) Pulse Rate:  [55-62] 55 (11/30 0509) Resp:  [15-18] 15 (11/30 0509) BP: (100-108)/(49-66) 100/49 (11/30 0509) SpO2:  [97 %-100 %] 97 % (11/30 0509) Last BM Date: 05/07/20 1980 p.o. No IV fluids recorded Urine 3150 BM x1 Afebrile, vital signs are stable BMP is normal except for glucose of 100 and a calcium of 8.3 WBC 9.4, H/H 11.1/33.4 Platelets 195,000 Ca 125: 63.6 Intake/Output from previous day: 11/29 0701 - 11/30 0700 In: 1980 [P.O.:1980] Out: 3150 [Urine:3150] Intake/Output this shift: No intake/output data recorded.  General appearance: alert, cooperative and no distress Resp: clear to auscultation bilaterally GI: Soft, sore, bowel sounds are hypoactive she has had some flatus no BM so far.  Lab Results:  Recent Labs    05/08/20 0319 05/09/20 0414  WBC 6.0 9.4  HGB 12.9 11.1*  HCT 39.2 33.4*  PLT 212 195    BMET Recent Labs    05/07/20 1721 05/09/20 0414  NA 135 139  K 3.6 4.0  CL 101 108  CO2 24 25  GLUCOSE 92 100*  BUN 16 7  CREATININE 0.92 0.77  CALCIUM 9.0 8.3*   PT/INR Recent Labs    05/07/20 1721  LABPROT 12.8  INR 1.0    Recent Labs  Lab 05/07/20 1721  AST  30  ALT 38  ALKPHOS 54  BILITOT 0.9  PROT 8.5*  ALBUMIN 4.5     Lipase     Component Value Date/Time   LIPASE 37 05/07/2020 1721     Medications: . Chlorhexidine Gluconate Cloth  6 each Topical Daily  . enoxaparin (LOVENOX) injection  40 mg Subcutaneous Q24H  . feeding supplement  1 Container Oral TID BM  . feeding supplement  237 mL Oral BID BM  . furosemide  20 mg Oral Daily  . lip balm  1 application Topical BID  . pantoprazole (PROTONIX) IV  40 mg Intravenous Q24H  . spironolactone  25 mg Oral Daily   . sodium chloride    . methocarbamol (ROBAXIN) IV    . ondansetron (ZOFRAN) IV      Assessment/Plan Chronic Hep B with cirrhosis  - Dr. Rhea Belton wishes to defer to [facility] as an outpatient Hx of alcohol use - ongoing at home Chronic abdominal pain and constipation Urinary retention - was supposed to see Dr. Earlene Plater in the office today, will reach out to urology, continue foley  Pneumoperitoneum of unknown etiology  S/p diagnostic laparoscopy with LOA 05/08/20 Dr. Michaell Cowing - POD# 1 - no perforation identified intra-operatively, no evidence of peritonitis  - removed NGT and allowing CLD - mobilize as tolerated  -EGD  planned by GI tomorrow   FEN: clear liquids >> full liquids today VTE: SCDs, lovenox ID: Zosyn 11/28>11/29; Cefotetan 11/29 Foley: was placed in urology office PTA -plan to leave the Foley in until she returns for her urology follow-up visit.  Discussed with urology yesterday.  Plan: GI planning EGD.  We will advance her to a full liquid diet.  Mobilize, she has only been out of bed to the bathroom.  I also taught her how to do the IS, she got up to 1700 on this.  Start her on Miralax today.      LOS: 1 day    Stephanee Barcomb 05/09/2020 Please see Amion

## 2020-05-09 NOTE — Discharge Instructions (Signed)
[facility] LAPAROSCOPIC SURGERY: POST OP INSTRUCTIONS Always review your discharge instruction sheet given to you by the facility where your surgery was performed. IF YOU HAVE DISABILITY OR FAMILY LEAVE FORMS, YOU MUST BRING THEM TO THE OFFICE FOR PROCESSING.   DO NOT GIVE THEM TO YOUR DOCTOR.  PAIN CONTROL  1. First take acetaminophen (Tylenol) AND/or ibuprofen (Advil) to control your pain after surgery.  Follow directions on package.  Taking acetaminophen (Tylenol) and/or ibuprofen (Advil) regularly after surgery will help to control your pain and lower the amount of prescription pain medication you may need.  You should not take more than 3,000 mg (3 grams) of acetaminophen (Tylenol) in 24 hours.  You should not take ibuprofen (Advil), aleve, motrin, naprosyn or other NSAIDS if you have a history of stomach ulcers or chronic kidney disease.  2. A prescription for pain medication may be given to you upon discharge.  Take your pain medication as prescribed, if you still have uncontrolled pain after taking acetaminophen (Tylenol) or ibuprofen (Advil). 3. Use ice packs to help control pain. 4. If you need a refill on your pain medication, please contact your pharmacy.  They will contact our office to request authorization. Prescriptions will not be filled after 5pm or on week-ends.  HOME MEDICATIONS 5. Take your usually prescribed medications unless otherwise directed.  DIET 6. You should follow a light diet the first few days after arrival home.  Be sure to include lots of fluids daily. Avoid fatty, fried foods.   CONSTIPATION 7. It is common to experience some constipation after surgery and if you are taking pain medication.  Increasing fluid intake and taking a stool softener (such as Colace) will usually help or prevent this problem from occurring.  A mild laxative (Milk of Magnesia or Miralax) should be taken according to package instructions if there are no bowel  movements after 48 hours.  WOUND/INCISION CARE 8. Most patients will experience some swelling and bruising in the area of the incisions.  Ice packs will help.  Swelling and bruising can take several days to resolve.  9. Unless discharge instructions indicate otherwise, follow guidelines below  a. STERI-STRIPS - you may remove your outer bandages 48 hours after surgery, and you may shower at that time.  You have steri-strips (small skin tapes) in place directly over the incision.  These strips should be left on the skin for 7-10 days.   b. DERMABOND/SKIN GLUE - you may shower in 24 hours.  The glue will flake off over the next 2-3 weeks. 10. Any sutures or staples will be removed at the office during your follow-up visit.  ACTIVITIES 11. You may resume regular (light) daily activities beginning the next day--such as daily self-care, walking, climbing stairs--gradually increasing activities as tolerated.  You may have sexual intercourse when it is comfortable.  Refrain from any heavy lifting or straining until approved by your doctor. a. You may drive when you are no longer taking prescription pain medication, you can comfortably wear a seatbelt, and you can safely maneuver your car and apply brakes.  FOLLOW-UP 12. You should see your doctor in the office for a follow-up appointment approximately 2-3 weeks after your surgery.  You should have been given your post-op/follow-up appointment when your surgery was scheduled.  If you did not receive a post-op/follow-up appointment, make sure that you call for this appointment within a day or two after you arrive home to insure a convenient appointment time.  WHEN   TO CALL YOUR DOCTOR: 1. Fever over 101.0 2. Inability to urinate 3. Continued bleeding from incision. 4. Increased pain, redness, or drainage from the incision. 5. Increasing abdominal pain  The clinic staff is available to answer your questions during regular business hours.  Please don't  hesitate to call and ask to speak to one of the nurses for clinical concerns.  If you have a medical emergency, go to the nearest emergency room or call 911.  A surgeon from [facility] is always on call at the hospital. [address], [address], [place], [place]  [address] ? [address] [address], [place], [place]   [address] [phone] ? [phone] ? FAX [phone] Web site: [email or web address]  

## 2020-05-09 NOTE — Plan of Care (Signed)

## 2020-05-09 NOTE — H&P (View-Only) (Signed)
Progress Note  Chief Complaint:    Question of gastric perforation     ASSESSMENT / PLAN:    # Pneumoperitoneum with suggestion of gastric fundus perforation on admission CT scan. Negative diagnostic laparoscopy yesterday with lysis of adhesions. No evidence of perforation nor peritonitis found. Of note patient does mention having an hour of epigastric pain on Saturday. She presented to ED on Sunday -Plan is for EGD tomorrow to further evaluate stomach. The risks and benefits of the procedure were already discussed with the patient.   # Bilateral lower abdominal pain. No etiology by CT scan. No significant ascites to suspect SBP. Question in Urologic related pain. She had similar pain recently after foley temporarily removed.  Urology following her outpatient for urinary retention of unclear etiology.   # Abnormal U/A with > 300 mg/ dl of protein, 00-76 WBC and some RBCs. Nitrite negative. Urine culture negative day 2. Serum albumin normal.   # Chronic HBV, no longer on antiviral treatment. Mildly active chronic hepatitis grade 2 of 4, mild fibrosis stage 1-2 of 4 by biopsy earlier this month.          SUBJECTIVE:   Sore from surgery but otherwise no abdominal pain     OBJECTIVE:    Scheduled inpatient medications:  . Chlorhexidine Gluconate Cloth  6 each Topical Daily  . enoxaparin (LOVENOX) injection  40 mg Subcutaneous Q24H  . feeding supplement  1 Container Oral TID BM  . feeding supplement  237 mL Oral BID BM  . furosemide  20 mg Oral Daily  . lip balm  1 application Topical BID  . pantoprazole (PROTONIX) IV  40 mg Intravenous Q24H  . polyethylene glycol  17 g Oral Daily  . spironolactone  25 mg Oral Daily   Continuous inpatient infusions:  . sodium chloride    . methocarbamol (ROBAXIN) IV    . ondansetron (ZOFRAN) IV     PRN inpatient medications: sodium chloride, acetaminophen, alum & mag hydroxide-simeth, diphenhydrAMINE, HYDROmorphone (DILAUDID) injection,  menthol-cetylpyridinium, methocarbamol (ROBAXIN) IV, metoprolol tartrate, ondansetron (ZOFRAN) IV **OR** ondansetron (ZOFRAN) IV, oxyCODONE, phenol, prochlorperazine, simethicone  Vital signs in last 24 hours: Temp:  [97.7 F (36.5 C)-98.2 F (36.8 C)] 98.2 F (36.8 C) (11/30 0509) Pulse Rate:  [55-62] 55 (11/30 0509) Resp:  [15-18] 15 (11/30 0509) BP: (100-108)/(49-66) 100/49 (11/30 0509) SpO2:  [97 %-100 %] 97 % (11/30 0509) Last BM Date: (P) 05/07/20  Intake/Output Summary (Last 24 hours) at 05/09/2020 1240 Last data filed at 05/09/2020 0600 Gross per 24 hour  Intake 1740 ml  Output 2550 ml  Net -810 ml     Physical Exam:  . General: Aler female in NAD . Heart:  Regular rate and rhythm. No lower extremity edema . Pulmonary: Normal respiratory effort . Abdomen: Soft, nondistended, sore from surgery. Normal bowel sounds.  . Neurologic: Alert and oriented . Psych: Pleasant. Cooperative.   Filed Weights   05/08/20 0321  Weight: 58 kg    Intake/Output from previous day: 11/29 0701 - 11/30 0700 In: 1980 [P.O.:1980] Out: 3150 [Urine:3150] Intake/Output this shift: No intake/output data recorded.    Lab Results: Recent Labs    05/07/20 2220 05/08/20 0319 05/09/20 0414  WBC 8.1 6.0 9.4  HGB 12.7 12.9 11.1*  HCT 37.7 39.2 33.4*  PLT 249 212 195   BMET Recent Labs    05/07/20 1721 05/09/20 0414  NA 135 139  K 3.6 4.0  CL 101 108  CO2 24  25  GLUCOSE 92 100*  BUN 16 7  CREATININE 0.92 0.77  CALCIUM 9.0 8.3*   LFT Recent Labs    05/07/20 1721  PROT 8.5*  ALBUMIN 4.5  AST 30  ALT 38  ALKPHOS 54  BILITOT 0.9   PT/INR Recent Labs    05/07/20 1721  LABPROT 12.8  INR 1.0   Hepatitis Panel No results for input(s): HEPBSAG, HCVAB, HEPAIGM, HEPBIGM in the last 72 hours.  CT Abdomen Pelvis Wo Contrast  Result Date: 05/07/2020 CLINICAL DATA:  Abdominal pain nonlocalized. EXAM: CT ABDOMEN AND PELVIS WITHOUT CONTRAST TECHNIQUE: Multidetector CT  imaging of the abdomen and pelvis was performed following the standard protocol without IV contrast. COMPARISON:  CT abdomen pelvis 04/13/2020 FINDINGS: Lower chest: No acute abnormality. Hepatobiliary: No focal liver abnormality. No gallstones, gallbladder wall thickening, or pericholecystic fluid. No biliary dilatation. Pancreas: No focal lesion. Normal pancreatic contour. No surrounding inflammatory changes. No main pancreatic ductal dilatation. Spleen: Normal in size without focal abnormality. Adrenals/Urinary Tract: No adrenal nodule bilaterally. Bilateral kidneys enhance symmetrically. No hydronephrosis. No hydroureter. The urinary bladder is decompressed with a urinary Foley catheter terminating within its lumen. Stomach/Bowel: Trace PO contrast noted within the lumen of the stomach. Gastric wall pneumatosis with several foci of free intraperitoneal gas in a linear formation along the gastric fundus. No evidence of bowel wall thickening or dilatation. Appendix appears normal. Vascular/Lymphatic: No abdominal aorta or iliac aneurysm. No abdominal, pelvic, or inguinal lymphadenopathy. Reproductive: Uterus and bilateral adnexa are unremarkable. Other: Interval decrease in now small volume simple free fluid ascites. Interval development of moderate volume free intraperitoneal gas. Musculoskeletal: Interval development of a nonspecific 0.8 cm soft tissue density within the left lateral anterior abdominal wall (2:48). Similar finding measuring 0.5 cm more superiorly (2:44). No suspicious lytic or blastic osseous lesions. No acute displaced fracture. IMPRESSION: 1. Suggestion of gastric fundus perforation with associated moderate pneumoperitoneum. 2. Interval decrease in small volume simple free fluid ascites within the pelvis. These results were called by telephone at the time of interpretation on 05/07/2020 at 10:03 pm to provider PA [facility] , who verbally acknowledged these results. Electronically  Signed   By: Tish Frederickson M.D.   On: 05/07/2020 22:09   X-ray abdomen AP  Result Date: 05/08/2020 CLINICAL DATA:  Check gastric catheter placement EXAM: ABDOMEN - 1 VIEW COMPARISON:  None. FINDINGS: Gastric catheter extends into the stomach. Previously seen free air is less well appreciated on this exam given the recent operative intervention. No other focal abnormality is noted. IMPRESSION: Gastric catheter within the stomach. Electronically Signed   By: Alcide Clever M.D.   On: 05/08/2020 02:13   DG Chest Portable 1 View  Result Date: 05/07/2020 CLINICAL DATA:  Free air on recent CT examination. EXAM: PORTABLE CHEST 1 VIEW COMPARISON:  04/15/2020 FINDINGS: Cardiac shadow is stable. The lungs are well aerated bilaterally. No focal infiltrate or sizable effusion is seen. Free air is noted primarily beneath the right hemidiaphragm similar to that seen on recent CT examination. No bony abnormality is noted. IMPRESSION: Free air within the abdomen similar to that seen on recent CT. No other focal abnormality is noted. Electronically Signed   By: Alcide Clever M.D.   On: 05/07/2020 23:06      Principal Problem:   Pneumoperitoneum Active Problems:   Chronic active viral hepatitis B (HCC)   Constipation, chronic   Ascites   Hepatic steatosis   Foley catheter in place   Chronic gastritis  History of urinary retention   History of gestational diabetes   History of depression   Free intraperitoneal air   Abnormal CT of the abdomen     LOS: 1 day   Willette Cluster ,NP 05/09/2020, 12:40 PM

## 2020-05-09 NOTE — Progress Notes (Signed)
Initial Nutrition Assessment  RD working remotely.  DOCUMENTATION CODES:   Not applicable  INTERVENTION:   - Continue Boost Breeze po TID, each supplement provides 250 kcal and 9 grams of protein  - Continue Ensure Surgery po BID, each supplement provides 330 kcal and 18 grams of protein  - Encourage adequate PO intake  NUTRITION DIAGNOSIS:   Increased nutrient needs related to acute illness, post-op healing as evidenced by estimated needs.  GOAL:   Patient will meet greater than or equal to 90% of their needs  MONITOR:   PO intake, Supplement acceptance, Diet advancement, Weight trends, Labs, I & O's  REASON FOR ASSESSMENT:   Malnutrition Screening Tool    ASSESSMENT:   34 year old female who presented to the ED on 11/28 with abdominal pain. PMH of portal HTN, depression, EtOH abuse, hepatitis B. Pt with recent admission for acute renal failure and ascites. Pt admitted for pneumoperitoneum of unknown etiology.   11/29 - s/p diagnostic laparoscopy with lysis of adhesions (no perforation identified, no evidence of peritonitis)  Noted plan for EGD tomorrow to further evaluate stomach.  Spoke with pt via phone call to room. Pt reports that she has been consuming full liquid meal trays today and is still working on lunch meal tray. She reports that so far she has consumed potato soup, beef broth, and jello. Pt denies any N/V at this time. She states that she is having some bloating and is unsure if this is new today as she has not been "with it" over the last few days.  Pt reports that PTA, she was "eating fine"' and had a "normal appetite." Pt was consuming 3 full meals daily but did not consume snacks.  Pt endorses weight loss related to drainage of ascites. She states that 2.5 weeks ago she weighed 137 lbs and now weighs 126 lbs. She is unsure of her UBW because she does not have a scale at home.  Per weight history in chart, pt with a 4 kg weight loss since 10/29. This  is a 6.5% weight loss in 1 month which is significant for timeframe. Unsure how much of weight loss is related to ascites vs true dry weight loss.  Pt has been consuming Boost Breeze without issue. Ensure Surgery ordered by Surgery PA today. Pt will be NPO at midnight for endoscopy tomorrow. Encouraged pt to continue consuming supplements to aid in meeting her kcal and protein needs.  Meal Completion: 100% x 1 documented clear liquid meal on 11/29  Medications reviewed and include: Boost Breeze TID, Ensure Surgery BID, lasix, protonix, miralax, spironolactone  Labs reviewed.  UOP: 3150 ml x 24 hours  NUTRITION - FOCUSED PHYSICAL EXAM:  Unable to complete at this time. RD working remotely.  Diet Order:   Diet Order            Diet NPO time specified  Diet effective midnight           Diet NPO time specified  Diet effective midnight           Diet full liquid Room service appropriate? Yes; Fluid consistency: Thin  Diet effective now                 EDUCATION NEEDS:   Education needs have been addressed  Skin:  Skin Assessment: Reviewed RN Assessment  Last BM:  05/08/20 small type 6  Height:   Ht Readings from Last 1 Encounters:  05/08/20 4\' 11"  (1.499 m)    Weight:  Wt Readings from Last 1 Encounters:  05/08/20 58 kg    BMI:  Body mass index is 25.83 kg/m.  Estimated Nutritional Needs:   Kcal:  1650-1850  Protein:  75-90 grams  Fluid:  1.6-1.8 L    Mertie Clause, MS, RD, LDN Inpatient Clinical Dietitian Please see AMiON for contact information.

## 2020-05-10 ENCOUNTER — Inpatient Hospital Stay (HOSPITAL_COMMUNITY): Payer: BC Managed Care – PPO | Admitting: Anesthesiology

## 2020-05-10 ENCOUNTER — Encounter (HOSPITAL_COMMUNITY): Admission: EM | Disposition: A | Discharge status: Home / Self Care | Payer: Self-pay | Source: Home / Self Care

## 2020-05-10 ENCOUNTER — Other Ambulatory Visit: Payer: Self-pay

## 2020-05-10 ENCOUNTER — Encounter (HOSPITAL_COMMUNITY): Payer: Self-pay

## 2020-05-10 DIAGNOSIS — K3189 Other diseases of stomach and duodenum: Secondary | ICD-10-CM

## 2020-05-10 HISTORY — PX: ESOPHAGOGASTRODUODENOSCOPY (EGD) WITH PROPOFOL: SHX5813

## 2020-05-10 HISTORY — PX: BIOPSY: SHX5522

## 2020-05-10 SURGERY — ESOPHAGOGASTRODUODENOSCOPY (EGD) WITH PROPOFOL
Anesthesia: Monitor Anesthesia Care

## 2020-05-10 MED ORDER — DEXAMETHASONE SODIUM PHOSPHATE 10 MG/ML IJ SOLN
INTRAMUSCULAR | Status: DC | PRN
  Administered 2020-05-10: 4 mg via INTRAVENOUS

## 2020-05-10 MED ORDER — SODIUM CHLORIDE 0.9 % IV SOLN: INTRAVENOUS | Status: DC | PRN

## 2020-05-10 MED ORDER — PROPOFOL 10 MG/ML IV BOLUS
INTRAVENOUS | Status: DC | PRN
  Administered 2020-05-10: 20 mg via INTRAVENOUS
  Administered 2020-05-10: 50 mg via INTRAVENOUS
  Administered 2020-05-10 (×2): 20 mg via INTRAVENOUS
  Administered 2020-05-10: 10 mg via INTRAVENOUS
  Administered 2020-05-10 (×3): 20 mg via INTRAVENOUS

## 2020-05-10 MED ORDER — ONDANSETRON HCL 4 MG/2ML IJ SOLN
INTRAMUSCULAR | Status: DC | PRN
  Administered 2020-05-10: 4 mg via INTRAVENOUS

## 2020-05-10 MED ORDER — LIDOCAINE 2% (20 MG/ML) 5 ML SYRINGE
INTRAMUSCULAR | Status: DC | PRN
  Administered 2020-05-10: 40 mg via INTRAVENOUS

## 2020-05-10 MED ORDER — PHENYLEPHRINE 40 MCG/ML (10ML) SYRINGE FOR IV PUSH (FOR BLOOD PRESSURE SUPPORT)
PREFILLED_SYRINGE | INTRAVENOUS | Status: DC | PRN
  Administered 2020-05-10: 80 ug via INTRAVENOUS

## 2020-05-10 MED ORDER — GLYCOPYRROLATE 0.2 MG/ML IJ SOLN
INTRAMUSCULAR | Status: DC | PRN
  Administered 2020-05-10 (×2): .1 mg via INTRAVENOUS

## 2020-05-10 SURGICAL SUPPLY — 15 items

## 2020-05-10 NOTE — Anesthesia Procedure Notes (Signed)
Procedure Name: MAC Date/Time: 05/10/2020 1:45 PM Performed by: Cynda Familia, CRNA Pre-anesthesia Checklist: Patient identified, Emergency Drugs available, Suction available, Patient being monitored and Timeout performed Patient Re-evaluated:Patient Re-evaluated prior to induction Oxygen Delivery Method: Simple face mask Placement Confirmation: positive ETCO2 and breath sounds checked- equal and bilateral Dental Injury: Teeth and Oropharynx as per pre-operative assessment  Comments: Bite block by RN

## 2020-05-10 NOTE — Anesthesia Postprocedure Evaluation (Signed)
Anesthesia Post Note  Patient: Kim Pacheco  Procedure(s) Performed: ESOPHAGOGASTRODUODENOSCOPY (EGD) WITH PROPOFOL (N/A ) BIOPSY     Patient location during evaluation: PACU Anesthesia Type: MAC Level of consciousness: oriented and awake and alert Pain management: pain level controlled Vital Signs Assessment: post-procedure vital signs reviewed and stable Respiratory status: spontaneous breathing, respiratory function stable and patient connected to nasal cannula oxygen Cardiovascular status: blood pressure returned to baseline and stable Postop Assessment: no headache, no backache and no apparent nausea or vomiting Anesthetic complications: no   No complications documented.  Last Vitals:  Vitals:   05/10/20 1430 05/10/20 1440  BP: 91/67 94/63  Pulse: 72 69  Resp: 10 12  Temp:    SpO2: 99% 96%    Last Pain:  Vitals:   05/10/20 1440  TempSrc:   PainSc: 0-No pain                 Soriah Leeman

## 2020-05-10 NOTE — Transfer of Care (Signed)
Immediate Anesthesia Transfer of Care Note  Patient: Kim Pacheco  Procedure(s) Performed: ESOPHAGOGASTRODUODENOSCOPY (EGD) WITH PROPOFOL (N/A ) BIOPSY  Patient Location: PACU and Endoscopy Unit  Anesthesia Type:MAC  Level of Consciousness: awake and alert   Airway & Oxygen Therapy: Patient Spontanous Breathing and Patient connected to face mask oxygen  Post-op Assessment: Report given to RN and Post -op Vital signs reviewed and stable  Post vital signs: Reviewed and stable  Last Vitals:  Vitals Value Taken Time  BP    Temp    Pulse 70 05/10/20 1418  Resp 13 05/10/20 1418  SpO2 98 % 05/10/20 1418  Vitals shown include unvalidated device data.  Last Pain:  Vitals:   05/10/20 1310  TempSrc: Temporal  PainSc: 0-No pain      Patients Stated Pain Goal: 4 (41/63/84 5364)  Complications: No complications documented.

## 2020-05-10 NOTE — Anesthesia Preprocedure Evaluation (Addendum)
Anesthesia Evaluation  Patient identified by MRN, date of birth, ID band Patient awake    Reviewed: Allergy & Precautions, NPO status , Patient's Chart, lab work & pertinent test results  Airway Mallampati: I  TM Distance: >3 FB Neck ROM: Full    Dental  (+) Teeth Intact, Dental Advisory Given   Pulmonary neg pulmonary ROS, former smoker,    Pulmonary exam normal        Cardiovascular hypertension, Pt. on medications  Rhythm:Regular Rate:Normal     Neuro/Psych PSYCHIATRIC DISORDERS Anxiety Depression negative neurological ROS  negative psych ROS   GI/Hepatic negative GI ROS, (+) Cirrhosis   ascites    , Hepatitis -, BPerforated gastric fundus   Endo/Other  negative endocrine ROSdiabetes, Type 2  Renal/GU Renal disease  negative genitourinary   Musculoskeletal negative musculoskeletal ROS (+)   Abdominal (+)  Abdomen: soft. Bowel sounds: normal.  Peds negative pediatric ROS (+)  Hematology negative hematology ROS (+)   Anesthesia Other Findings   Reproductive/Obstetrics negative OB ROS                            Anesthesia Physical  Anesthesia Plan  ASA: III  Anesthesia Plan: MAC   Post-op Pain Management:    Induction: Intravenous and Rapid sequence  PONV Risk Score and Plan: 3 and Ondansetron, Dexamethasone and Treatment may vary due to age or medical condition  Airway Management Planned: Nasal Cannula, Natural Airway and Simple Face Mask  Additional Equipment: None  Intra-op Plan:   Post-operative Plan: Possible Post-op intubation/ventilation  Informed Consent: I have reviewed the patients History and Physical, chart, labs and discussed the procedure including the risks, benefits and alternatives for the proposed anesthesia with the patient or authorized representative who has indicated his/her understanding and acceptance.     Dental advisory given  Plan  Discussed with: CRNA and Anesthesiologist  Anesthesia Plan Comments: (Lab Results      Component                Value               Date                      WBC                      8.1                 05/07/2020                HGB                      12.7                05/07/2020                HCT                      37.7                05/07/2020                MCV                      89.8                05/07/2020  PLT                      249                 05/07/2020           Lab Results      Component                Value               Date                      NA                       135                 05/07/2020                K                        3.6                 05/07/2020                CO2                      24                  05/07/2020                GLUCOSE                  92                  05/07/2020                BUN                      16                  05/07/2020                CREATININE               0.92                05/07/2020                CALCIUM                  9.0                 05/07/2020                GFRNONAA                 >60                 05/07/2020                GFRAA                    >60                 06/26/2018          )        Anesthesia Quick Evaluation

## 2020-05-10 NOTE — Interval H&P Note (Signed)
History and Physical Interval Note:  05/10/2020 1:38 PM  Kim Pacheco  has presented today for surgery, with the diagnosis of Abnormal CT scan abdomen.  The various methods of treatment have been discussed with the patient and family. After consideration of risks, benefits and other options for treatment, the patient has consented to  Procedure(s): ESOPHAGOGASTRODUODENOSCOPY (EGD) WITH PROPOFOL (N/A) as a surgical intervention.  The patient's history has been reviewed, patient examined, no change in status, stable for surgery.  I have reviewed the patient's chart and labs.  Questions were answered to the patient's satisfaction.     Viviann Spare P Keylan Costabile

## 2020-05-10 NOTE — Progress Notes (Signed)
3 Days Post-Op  Subjective: Patient doing well today.  Pain is improving.  No nausea or vomiting.  Moving her bowels and having flatus.  Tolerating full liquids yesterday.  ROS: See above, otherwise other systems negative  Objective: Vital signs in last 24 hours: Temp:  [98.2 F (36.8 C)-99.2 F (37.3 C)] 98.2 F (36.8 C) (12/01 0900) Pulse Rate:  [55-63] 55 (12/01 0900) Resp:  [17-18] 17 (12/01 0900) BP: (90-106)/(57-69) 90/58 (12/01 0900) SpO2:  [95 %-100 %] 100 % (12/01 0900) Last BM Date: 05/07/20  Intake/Output from previous day: 11/30 0701 - 12/01 0700 In: 237 [P.O.:237] Out: 2950 [Urine:2950] Intake/Output this shift: Total I/O In: -  Out: 200 [Urine:200]  PE: Heart: regular Lungs: CTAB Abd: soft, appropriately tender, incisions are c/d/i with gauze and tegaderm present, +BS, ND  Lab Results:  Recent Labs    05/08/20 0319 05/09/20 0414  WBC 6.0 9.4  HGB 12.9 11.1*  HCT 39.2 33.4*  PLT 212 195   BMET Recent Labs    05/07/20 1721 05/09/20 0414  NA 135 139  K 3.6 4.0  CL 101 108  CO2 24 25  GLUCOSE 92 100*  BUN 16 7  CREATININE 0.92 0.77  CALCIUM 9.0 8.3*   PT/INR Recent Labs    05/07/20 1721  LABPROT 12.8  INR 1.0   CMP     Component Value Date/Time   NA 139 05/09/2020 0414   K 4.0 05/09/2020 0414   CL 108 05/09/2020 0414   CO2 25 05/09/2020 0414   GLUCOSE 100 (H) 05/09/2020 0414   BUN 7 05/09/2020 0414   CREATININE 0.77 05/09/2020 0414   CALCIUM 8.3 (L) 05/09/2020 0414   PROT 8.5 (H) 05/07/2020 1721   ALBUMIN 4.5 05/07/2020 1721   AST 30 05/07/2020 1721   ALT 38 05/07/2020 1721   ALKPHOS 54 05/07/2020 1721   BILITOT 0.9 05/07/2020 1721   GFRNONAA >60 05/09/2020 0414   GFRAA >60 06/26/2018 0531   Lipase     Component Value Date/Time   LIPASE 37 05/07/2020 1721       Studies/Results: No results found.  Anti-infectives: Anti-infectives (From admission, onward)   Start     Dose/Rate Route Frequency Ordered Stop    05/08/20 1200  cefoTEtan (CEFOTAN) 2 g in sodium chloride 0.9 % 100 mL IVPB        2 g 200 mL/hr over 30 Minutes Intravenous Every 12 hours 05/08/20 0250 05/08/20 1332   05/08/20 0600  piperacillin-tazobactam (ZOSYN) IVPB 3.375 g  Status:  Discontinued        3.375 g 12.5 mL/hr over 240 Minutes Intravenous Every 8 hours 05/07/20 2234 05/08/20 0250   05/07/20 2330  cefoTEtan (CEFOTAN) 2 g in sodium chloride 0.9 % 100 mL IVPB        2 g 200 mL/hr over 30 Minutes Intravenous On call to O.R. 05/07/20 2318 05/08/20 0011   05/07/20 2245  piperacillin-tazobactam (ZOSYN) IVPB 3.375 g        3.375 g 100 mL/hr over 30 Minutes Intravenous STAT 05/07/20 2231 05/07/20 2323       Assessment/Plan Chronic Hep B with cirrhosis  - Dr. Rhea Belton wishes to defer [facility] as an outpatient Hx of alcohol use - ongoing at home Chronic abdominal pain and constipation Urinary retention - was supposed to see Dr. Earlene Plater in the office today, will reach out to urology, continue foley  Pneumoperitoneum of unknown etiology  POD 3, S/p diagnostic laparoscopy with  LOA 05/08/20 Dr. Michaell Cowing - no perforation identified intra-operatively, no evidence of peritonitis  - FLD yesterday, NPO today for EGD - mobilize as tolerated  -EGD by GI today -can likely adv diet after EGD if nothing found  FEN: NPO for EGD VTE: SCDs, lovenox ID: Zosyn 11/28>11/29; Cefotetan 11/29 Foley: was placed in urology office PTA -plan to leave the Foley in until she returns for her urology follow-up visit.  Discussed with urology yesterday.   LOS: 2 days    Letha Cape , [facility] Surgery 05/10/2020, 10:55 AM Please see Amion for pager number during day hours 7:00am-4:30pm or 7:00am -11:30am on weekends

## 2020-05-10 NOTE — Op Note (Signed)
[facility] Patient Name: Kim Pacheco Procedure Date: 05/10/2020 MRN: [id number] Attending MD: Carlota Raspberry. Havery Moros , MD Date of Birth: 04-Mar-1986 CSN: [id number] Age: 34 Admit Type: Outpatient Procedure:                Upper GI endoscopy Indications:              Abdominal pain, Abnormal CT of the GI tract -                            suspicion for possible gastric perforation                            previously, negative ex-lap for obvious                            perforation. Pain seems to be mostly lower to mid                            abdomen at this time, but improving. tolerated                            liquids yesterday Providers:                Remo Lipps P. Havery Moros, MD, Cleda Daub, RN,                            Benetta Spar, Technician, Ladona Ridgel,                            Technician Referring MD:              Medicines:                Monitored Anesthesia Care Complications:            No immediate complications. Estimated blood loss:                            Minimal. Estimated Blood Loss:     Estimated blood loss was minimal. Procedure:                Pre-Anesthesia Assessment:                           - Prior to the procedure, a History and Physical                            was performed, and patient medications and                            allergies were reviewed. The patient's tolerance of                            previous anesthesia was also reviewed. The risks                            and benefits of the procedure and the sedation  options and risks were discussed with the patient.                            All questions were answered, and informed consent                            was obtained. Prior Anticoagulants: The patient has                            taken no previous anticoagulant or antiplatelet                            agents. ASA Grade Assessment: III - A patient with                             severe systemic disease. After reviewing the risks                            and benefits, the patient was deemed in                            satisfactory condition to undergo the procedure.                           After obtaining informed consent, the endoscope was                            passed under direct vision. Throughout the                            procedure, the patient's blood pressure, pulse, and                            oxygen saturations were monitored continuously. The                            [id number]-[id number] ([id number]) was introduced through the                            mouth, and advanced to the second part of duodenum.                            The upper GI endoscopy was accomplished without                            difficulty. The patient tolerated the procedure                            well. Scope In: Scope Out: Findings:      Esophagogastric landmarks were identified: the Z-line was found at 35       cm, the gastroesophageal junction was found at 35 cm and the upper       extent of the gastric folds was found at 35 cm from the  incisors.      The exam of the esophagus was otherwise normal. No esophageal varices.      Patchy red markings was found in the gastric fundus and in the gastric       body, most consistent with superficial trauma from prior NG tube.      The exam of the stomach was otherwise normal. No ulcers anywhere in the       stomach. No pathology in the fundus to correlate to prior CT images. No       gastric varices.      Biopsies were taken with a cold forceps in the gastric body, at the       incisura and in the gastric antrum for Helicobacter pylori testing.      The ampulla was a bit prominent, could be normal variant. Biopsies were       taken with a cold forceps for histology.      The exam of the duodenum was otherwise normal. Impression:               - Esophagogastric landmarks identified.                            - Normal esophagus otherwise.                           - Erythematous mucosa in the proximal stomach                            likely due to superficial NG tube trauma                           - Normal stomach otherwise, no obvious pathology                            noted to correlate to prior CT findings. No ulcer                            appreciated. Biopsies taken to rule out H pylori                           - Prominent ampulla, could be normal variant.                            Biopsied.                           Overall, no clear cause for abdominal pain or CT                            findings on this exam. Unclear what has caused                            this. Pain seems mostly lower to mid abdomen today.                            Patient has no colonic diverticulosis or pathology  noted otherwise on CT to cause this. She seems to                            be improving with conservative measures. Moderate Sedation:      No moderate sedation, case performed with MAC Recommendation:           - Return patient to hospital ward for ongoing care.                           - Advance diet as tolerated if okay per surgical                            service, monitor for worsening symptoms                           - Continue present medications.                           - Await pathology results                           - GI service will reassess her in the AM, call with                            questions otherwise Procedure Code(s):        --- Professional ---                           [phone], Esophagogastroduodenoscopy, flexible,                            transoral; with biopsy, single or multiple Diagnosis Code(s):        --- Professional ---                           K31.89, Other diseases of stomach and duodenum                           R10.9, Unspecified abdominal pain                           R93.3, Abnormal findings on  diagnostic imaging of                            other parts of digestive tract CPT copyright 2019 [employer] Association. All rights reserved. The codes documented in this report are preliminary and upon coder review may  be revised to meet current compliance requirements. Remo Lipps P. Havery Moros, MD 05/10/2020 2:25:22 PM This report has been signed electronically. Number of Addenda: 0

## 2020-05-11 ENCOUNTER — Encounter (HOSPITAL_COMMUNITY): Payer: Self-pay | Admitting: Gastroenterology

## 2020-05-11 ENCOUNTER — Other Ambulatory Visit: Payer: Self-pay

## 2020-05-11 DIAGNOSIS — K5909 Other constipation: Secondary | ICD-10-CM | POA: Diagnosis not present

## 2020-05-11 DIAGNOSIS — R1084 Generalized abdominal pain: Secondary | ICD-10-CM

## 2020-05-11 DIAGNOSIS — R935 Abnormal findings on diagnostic imaging of other abdominal regions, including retroperitoneum: Secondary | ICD-10-CM | POA: Diagnosis not present

## 2020-05-11 LAB — HEPATIC FUNCTION PANEL
ALT: 22 U/L (ref 0–44)
AST: 20 U/L (ref 15–41)
Albumin: 3.7 g/dL (ref 3.5–5.0)
Alkaline Phosphatase: 41 U/L (ref 38–126)
Bilirubin, Direct: 0.1 mg/dL (ref 0.0–0.2)
Indirect Bilirubin: 0.4 mg/dL (ref 0.3–0.9)
Total Bilirubin: 0.5 mg/dL (ref 0.3–1.2)
Total Protein: 7.1 g/dL (ref 6.5–8.1)

## 2020-05-11 LAB — SURGICAL PATHOLOGY

## 2020-05-11 MED ORDER — POLYETHYLENE GLYCOL 3350 17 G PO PACK
17.0000 g | PACK | Freq: Two times a day (BID) | ORAL | Status: DC
  Administered 2020-05-11 – 2020-05-12 (×2): 17 g via ORAL
  Filled 2020-05-11 (×2): qty 1

## 2020-05-11 MED ORDER — MINERAL OIL RE ENEM
1.0000 | ENEMA | Freq: Once | RECTAL | Status: AC
  Administered 2020-05-11: 1 via RECTAL
  Filled 2020-05-11 (×2): qty 1

## 2020-05-11 MED ORDER — BISACODYL 10 MG RE SUPP
10.0000 mg | Freq: Once | RECTAL | Status: AC
  Administered 2020-05-11: 10 mg via RECTAL
  Filled 2020-05-11: qty 1

## 2020-05-11 NOTE — Progress Notes (Signed)
1 Day Post-Op    CC: Abdominal pain  Subjective: She had a lot of questions this a.m.  She told her through the night she felt like [place] pig of here, so overall she is not extremely happy with her progress. I removed all her dressing all her port sites look fine.  She is tolerating a soft diet well.  She has not had a bowel movement since admission.  She has been had MiraLAX ordered for 3 days.  She had her first dose this a.m. it 9:47 AM.  We plan to leave the Foley in place and let her follow-up with [facility].  He seems comfortable with that plan.   Objective: Vital signs in last 24 hours: Temp:  [97.9 F (36.6 C)-98.7 F (37.1 C)] 98.4 F (36.9 C) (12/02 0601) Pulse Rate:  [52-72] 52 (12/02 0601) Resp:  [10-18] 16 (12/02 0601) BP: (91-110)/(63-76) 103/68 (12/02 0601) SpO2:  [96 %-99 %] 97 % (12/02 0601) Last BM Date: 05/07/20 515 Po 200 IV 1900 urine No BM recorded No lab   Intake/Output from previous day: 12/01 0701 - 12/02 0700 In: 715 [P.O.:515; I.V.:200] Out: 1900 [Urine:1900] Intake/Output this shift: No intake/output data recorded.  General appearance: alert, cooperative and no distress Resp: clear to auscultation bilaterally GI: Soft, sore, still having some discomfort.  She took Zofran once yesterday and today.  No BM so far.  Port sites all look fine, dressing was removed.  Lab Results:  Recent Labs    05/09/20 0414  WBC 9.4  HGB 11.1*  HCT 33.4*  PLT 195    BMET Recent Labs    05/09/20 0414  NA 139  K 4.0  CL 108  CO2 25  GLUCOSE 100*  BUN 7  CREATININE 0.77  CALCIUM 8.3*   PT/INR No results for input(s): LABPROT, INR in the last 72 hours.  Recent Labs  Lab 05/07/20 1721  AST 30  ALT 38  ALKPHOS 54  BILITOT 0.9  PROT 8.5*  ALBUMIN 4.5     Lipase     Component Value Date/Time   LIPASE 37 05/07/2020 1721     Medications:  Chlorhexidine Gluconate Cloth  6 each Topical Daily   feeding supplement  1 Container Oral  TID BM   feeding supplement  237 mL Oral BID BM   furosemide  20 mg Oral Daily   lip balm  1 application Topical BID   pantoprazole (PROTONIX) IV  40 mg Intravenous Q24H   polyethylene glycol  17 g Oral Daily   spironolactone  25 mg Oral Daily    methocarbamol (ROBAXIN) IV     ondansetron (ZOFRAN) IV      Assessment/Plan Chronic Hep B with cirrhosis -Dr. Rhea Belton wishes to [facility] an outpatient Hx of alcohol use - ongoing at home Chronic abdominal pain and constipation Urinary retention - was supposed to see Dr. Earlene Plater in the office today, will reach out to urology, continue foley  Pneumoperitoneum of unknown etiology  POD 3, S/p diagnostic laparoscopy with LOA 05/08/20 Dr. Michaell Cowing - no perforation identified intra-operatively, no evidence of peritonitis  - FLD yesterday, NPO today for EGD - mobilize as tolerated -EGD by GI today -can likely adv diet after EGD if nothing found  SWF:UXNA diet VTE: SCDs, lovenox on hold for EGD ID: Zosyn 11/28>11/29; Cefotetan 11/29 pre op Foley: was placed in urology office PTA-plan to leave the Foley in until she returns for her urology follow-up visit. Discussed  with urology yesterday. Pain/nausea: Oxycodone 5 mg x 2 yesterday; Zofran x1 yesterday and this a.m.   Plan: We will give her Dulcolax suppository.  I want to make sure she is having bowel movements prior to discharge.  She has problems with chronic abdominal pain and constipation.  She is going to follow-up with [facility] for her hepatitis.  She has follow-up with Dr. Michaell Cowing scheduled on 06/14/2020.    LOS: 3 days    Kim Pacheco 05/11/2020 Please see Amion

## 2020-05-11 NOTE — Plan of Care (Signed)
Plan of care reviewed and discussed with the patient. 

## 2020-05-11 NOTE — Progress Notes (Signed)
Patient still not able to have a bowel movement.  Complaining of abdominal pain again.  Although she does not look in any distress.  I had the nurse check her for an impaction she does not have an impaction.  We will try a mineral oil enema tonight, I will also increase her MiraLAX to twice daily.  We will review everything again in the a.m.

## 2020-05-11 NOTE — Progress Notes (Addendum)
Progress Note   Subjective  Chief Complaint: Abdominal pain, question perforation  EGD 05/10/2020 with erythematous mucosa in the proximal stomach likely due to superficial NG tube trauma, normal stomach otherwise, no obvious pathology noted to correlate to prior CT findings, no ulcer, overall no clear cause for abdominal pain or CT findings.  Today, the patient tells me that when she came in she was having bilateral lower abdominal pain when she was straining to have a bowel movement.  She has not had a bowel movement since being here and has just now started eating regular solid food this morning, so "I guess we will see how I do when I have a stool".  She is found with her husband by her side and they have a lot of questions.  Apparently there have been no real answers since she has been here for what caused this perforation and they are worried that if there is not a cause found then this may just occur again.  She is also asking questions about getting her dressings changed from her surgery the other day as well as taking a shower.  Also describes that she still has a Foley and is wondering why urology cannot go ahead and do any studies that they need to do while she is in the hospital.  She and her husband have a lot of good questions.  Tried to spend time with them answering what I could.   Objective   Vital signs in last 24 hours: Temp:  [97.9 F (36.6 C)-98.7 F (37.1 C)] 98.4 F (36.9 C) (12/02 0601) Pulse Rate:  [52-72] 52 (12/02 0601) Resp:  [10-18] 16 (12/02 0601) BP: (91-110)/(63-76) 103/68 (12/02 0601) SpO2:  [96 %-99 %] 97 % (12/02 0601) Last BM Date: 05/07/20 General:    Asian female in NAD Heart:  Regular rate and rhythm; no murmurs Lungs: Respirations even and unlabored, lungs CTA bilaterally Abdomen:  Soft, mild ttp around area of surgical incisions and nondistended. Normal bowel sounds. Extremities:  Without edema. Neurologic:  Alert and oriented,  grossly normal  neurologically. Psych:  Cooperative. Normal mood and affect.  Intake/Output from previous day: 12/01 0701 - 12/02 0700 In: 715 [P.O.:515; I.V.:200] Out: 1900 [Urine:1900]  Lab Results: Recent Labs    05/09/20 0414  WBC 9.4  HGB 11.1*  HCT 33.4*  PLT 195   BMET Recent Labs    05/09/20 0414  NA 139  K 4.0  CL 108  CO2 25  GLUCOSE 100*  BUN 7  CREATININE 0.77  CALCIUM 8.3*     Assessment / Plan:   Assessment: 1.  Pneumoperitoneum: No source found during exploratory laparotomy or EGD yesterday 2.  Bilateral lower abdominal pain: Uncertain origin 3.  Chronic HBV  Plan: 1.  Discussed with surgical team, patient is wondering if she can get a change of her surgical dressings and if she can take a shower.  They are going to address when they see her this morning. 2.  They have questions in regards to source of pneumoperitoneum which we are unclear of at the moment.  Suggested that we possibly repeat a CT scan today.  We will discuss further with Dr. Meridee Score. 3.  Patient and her husband are questioning why urology cannot see the patient while she is in here to further evaluate why she has to have a Foley.  I feel like this is a reasonable question.  Reached out to the on call urologist who has not yet  returned my call. 4.  Please await any further recommendations later today.  Thank you for kind consultation, we will continue to follow.    LOS: 3 days   Unk Lightning  05/11/2020, 10:34 AM

## 2020-05-11 NOTE — Telephone Encounter (Signed)
Patient ended up in hospital before this message was relayed to provider.

## 2020-05-12 DIAGNOSIS — R935 Abnormal findings on diagnostic imaging of other abdominal regions, including retroperitoneum: Secondary | ICD-10-CM | POA: Diagnosis not present

## 2020-05-12 DIAGNOSIS — K2950 Unspecified chronic gastritis without bleeding: Secondary | ICD-10-CM | POA: Diagnosis not present

## 2020-05-12 DIAGNOSIS — K5909 Other constipation: Secondary | ICD-10-CM | POA: Diagnosis not present

## 2020-05-12 DIAGNOSIS — R1030 Lower abdominal pain, unspecified: Secondary | ICD-10-CM | POA: Diagnosis not present

## 2020-05-12 LAB — CBC
HCT: 36.5 % (ref 36.0–46.0)
Hemoglobin: 12.2 g/dL (ref 12.0–15.0)
MCH: 30.5 pg (ref 26.0–34.0)
MCHC: 33.4 g/dL (ref 30.0–36.0)
MCV: 91.3 fL (ref 80.0–100.0)
Platelets: 212 10*3/uL (ref 150–400)
RBC: 4 MIL/uL (ref 3.87–5.11)
RDW: 11.7 % (ref 11.5–15.5)
WBC: 7 10*3/uL (ref 4.0–10.5)
nRBC: 0 % (ref 0.0–0.2)

## 2020-05-12 LAB — BASIC METABOLIC PANEL
Anion gap: 9 (ref 5–15)
BUN: 12 mg/dL (ref 6–20)
CO2: 25 mmol/L (ref 22–32)
Calcium: 8.4 mg/dL — ABNORMAL LOW (ref 8.9–10.3)
Chloride: 101 mmol/L (ref 98–111)
Creatinine, Ser: 0.72 mg/dL (ref 0.44–1.00)
GFR, Estimated: >60 mL/min (ref >60)
Glucose, Bld: 126 mg/dL — ABNORMAL HIGH (ref 70–99)
Potassium: 3.6 mmol/L (ref 3.5–5.1)
Sodium: 135 mmol/L (ref 135–145)

## 2020-05-12 MED ORDER — POLYETHYLENE GLYCOL 3350 17 G PO PACK
17.0000 g | PACK | Freq: Two times a day (BID) | ORAL | 0 refills | Status: DC
Start: 2020-05-12 — End: 2020-09-05

## 2020-05-12 MED ORDER — METHOCARBAMOL 500 MG PO TABS
500.0000 mg | ORAL_TABLET | Freq: Three times a day (TID) | ORAL | 0 refills | Status: DC | PRN
Start: 2020-05-12 — End: 2020-09-05

## 2020-05-12 MED ORDER — METHOCARBAMOL 500 MG PO TABS
500.0000 mg | ORAL_TABLET | Freq: Three times a day (TID) | ORAL | Status: DC | PRN
  Filled 2020-05-12: qty 2

## 2020-05-12 MED ORDER — PANTOPRAZOLE SODIUM 40 MG PO TBEC: 40.0000 mg | DELAYED_RELEASE_TABLET | Freq: Every day | ORAL | Status: DC

## 2020-05-12 MED ORDER — OXYCODONE HCL 5 MG PO TABS: 5.0000 mg | ORAL_TABLET | ORAL | 0 refills | Status: DC | PRN

## 2020-05-12 NOTE — Plan of Care (Signed)
Patient discharged home in stable condition. Instructions given to pt and her husband, both verbalized understanding

## 2020-05-12 NOTE — Progress Notes (Signed)
    Progress Note   Subjective  Chief Complaint: Abdominal pain, question of perforation  Today, the patient is seen with her husband by her bedside.  The surgical PA is also in the room at time of my interview.  Patient tells me that after having MiraLAX yesterday as well as an enema she did pass a stool but did have some low back pain when passing the stool which passed afterwards.  She continues to have some pain in the front of her abdomen around surgical sites but in general is doing slightly better than yesterday.  They do ask questions about what to do when she leaves.   Objective   Vital signs in last 24 hours: Temp:  [97.5 F (36.4 C)-98.1 F (36.7 C)] 97.5 F (36.4 C) (12/03 0541) Pulse Rate:  [50-59] 52 (12/03 0541) Resp:  [16-18] 18 (12/03 0541) BP: (100-104)/(57-67) 101/64 (12/03 0541) SpO2:  [98 %-100 %] 98 % (12/03 0541) Last BM Date: 05/11/20 General:    Asian female in NAD Heart:  Regular rate and rhythm; no murmurs Lungs: Respirations even and unlabored, lungs CTA bilaterally Abdomen:  Soft, nontender and nondistended. Normal bowel sounds. Extremities:  Without edema. Neurologic:  Alert and oriented,  grossly normal neurologically. Psych:  Cooperative. Normal mood and affect.  Intake/Output from previous day: 12/02 0701 - 12/03 0700 In: 350 [P.O.:350] Out: 2750 [Urine:2750]  Lab Results: Recent Labs    05/12/20 0739  WBC 7.0  HGB 12.2  HCT 36.5  PLT 212   BMET Recent Labs    05/12/20 0739  NA 135  K 3.6  CL 101  CO2 25  GLUCOSE 126*  BUN 12  CREATININE 0.72  CALCIUM 8.4*   LFT Recent Labs    05/11/20 2033  PROT 7.1  ALBUMIN 3.7  AST 20  ALT 22  ALKPHOS 41  BILITOT 0.5  BILIDIR 0.1  IBILI 0.4    Assessment / Plan:   Assessment: 1.  Pneumoperitoneum: Uncertain etiology, nothing found on exploratory laparotomy or EGD 2.  Bilateral lower abdominal pain: Uncertain etiology, definitely worse with a bowel movement, though she also did  just have an exploratory laparotomy which could be contributing as well as some constipation 3.  Chronic HBV  Plan: 1.  Patient already has follow-up with Dr. Christella Hartigan next week.  At that time they can discuss utilization of a colonoscopy pending continuation of her lower abdominal pain. 2.  Discussed with the patient and her husband that she needs to maintain regular bowel movements, would recommend that she stay on MiraLAX at least twice a day, let them know that she can actually use this 4 times a day if necessary.  If the patient gets constipated for more than a couple of days then she should call and let us know and we can advise her as to what to do next.  Constipation will certainly make her symptoms worse. 3.  Surgical PA is going to write for some Robaxin to see if this helps with her pain, also some Oxycodone to use "sparingly". 4.  Patient will need to follow with the liver clinic after time of discharge to follow-up on her chronic HBV. 5.  Please await any final recommendations from Dr. Meridee Score later today.  We will sign off.   Thank you for your kind consultation.    LOS: 4 days   Unk Lightning  05/12/2020, 11:09 AM

## 2020-05-12 NOTE — Plan of Care (Signed)
Plan of care reviewed and discussed with the patient. 

## 2020-05-12 NOTE — Discharge Summary (Signed)
Patient ID: Kim Pacheco [id number] 06/21/1985 34 y.o.  Admit date: 05/07/2020 Discharge date: 05/12/2020  Admitting Diagnosis: Chronic hep B with possible early cirrhosis Ascites - s/p recent paracenteses  Pneumoperitoneum of unclear etiology Chronic urinary retention with foley catheter in place  Discharge Diagnosis Patient Active Problem List   Diagnosis Date Noted  . History of recurrent UTI (urinary tract infection) 05/08/2020  . Free intraperitoneal air 05/08/2020  . Anxiety   . Abnormal CT of the abdomen   . Hepatic steatosis 05/07/2020  . Pneumoperitoneum 05/07/2020  . Foley catheter in place 05/07/2020  . Anal fissure 05/07/2020  . Change in bowel habit 05/07/2020  . Periumbilical pain 05/07/2020  . Rectal bleeding 05/07/2020  . History of gestational diabetes 05/07/2020  . History of depression 05/07/2020  . Portal hypertension (HCC)   . History of urinary retention 04/15/2020  . Chronic abdominal pain 04/13/2020  . Narcolepsy without cataplexy 02/02/2020  . Chronic gastritis 05/08/2019  . Depression, major, single episode, moderate (HCC) 10/28/2018  . Elevated CA-125 08/06/2018  . ARF (acute renal failure) (HCC) 06/25/2018  . Alcohol abuse 06/24/2018  . Constipation, chronic 06/24/2018  . Ascites 06/24/2018  . Body mass index (BMI) of 25.0 to 29.9 05/29/2018  . Fibrocystic disease of breast 05/29/2018  . Metrorrhagia 05/29/2018  . Urinary tract infectious disease 05/29/2018  . Oral hypoglycemic controlled White classification A2 gestational diabetes mellitus (GDM) 11/18/2017  . Previous cesarean delivery, delivered: arrest of dilation  11/18/2017  . Abnormal glucose tolerance test (GTT) during pregnancy, antepartum 09/17/2017  . Chronic active viral hepatitis B (HCC) 06/14/2014    Consultants Dr. Ileene Patrick, GI  Reason for Admission: Young woman followed [facility]  History hepatitis B.  Sounds like since  birth.  Was on antiviral therapies but paused when she was pregnant.  Intermittent alcohol use.  Initially cared by Dr. Kinnie Scales but was released from the practice.  She has has consulted with [facility] here as well.  She struggles with chronic abdominal pain & constipation.  She had upper endoscopy which was rather underwhelming last year -biopsy showed some chronic inactive gastritis.  Helicobacter pylori negative.  No major irritation.  Had follow-up colonoscopy that was underwhelming by Dr. Christella Hartigan.  She struggled with urinary retention and constipation March 2020.  Underwent laparoscopy 08/2018 by gynecology given elevated CA-125.  All I found more few mild adhesions to her C-section.  No other surprises.  She had urinary retention issues back then.  She was admitted last month in renal failure with ascites.  Work-up concerning for possible portal hypertension but no major cirrhosis, only mild fibrosis, noted on liver biopsy.She was rehydrated and improved.  She had issues urine retention so Foley catheter placed.  Not able to urinate yet.  History of moderate constipation moving her bowels every 3 or 4 days.  Had been on MiraLAX.  Recent complaint of crampy abdominal pain.  Recommendation made to start lactulose.  She felt worsening pain discomfort and came to emergency room at [facility] Sunday night.  No obvious shock or loose attacks ptosis but given persistent abdominal pain CT scan done.  That reveals free air in upper abdomen with probable disruption of proximal stomach suspicious suspicious for gastric perforation.  She is adopted so does not know her family history believes her biologic parents were from [place].  At least fatty change steatosis along with chronic hepatitis B infection.  History of some intermittent binge alcohol drinking  but claims she only occasionally drinks wine 1-2x week, much less.  She had an underwhelming Thanksgiving dinner.  No IV alcohol use.  No food changes.  No  analysis been sick.  No history of retching or hematemesis.  She does not aware of any gastroparesis or major heartburn or reflux.    Procedures Diagnostic laparoscopy with LOA, Dr. Michaell Cowing 05/08/20  Hospital Course:  The patient was admitted and started on IV abx therapy and taken to the OR for the above procedure.  No etiology was found at the time of surgery to explain her pneumoperitoneum on CT scan.  GI was consulted due to a lack of findings and concern for possible ulcer on CT scan as etiology.  She also has been followed by GI for chronic hep B with portal HTN and ascites.  She underwent an EGD which did not reveal any pathology either.  Her diet was able to be advanced as tolerated.  She required some miralax and suppositories to have a BM after surgery.  She continued to have some abdominal pain that seemed crampy in nature while having a BM and some lower back pain of unclear etiology.  Her WBC was normal, AF, and her abdominal pain overall improved.  She was tolerating a regular diet with no nausea, vomiting, or increasing pain.  Her foley catheter remained in place during her stay.  We did reach out to urology who wished for her foley to stay and for her to follow up as an outpatient.  She may good urine during her stay.  The patient has close GI, urology, and surgical follow up.  She was felt medically stable for DC home on POD 5.  Physical Exam: Abd: soft, minimally tender as expected post operatively, incisions c/d/i and healing well, +BS, ND  Allergies as of 05/12/2020      Reactions   Contrast Media [iodinated Diagnostic Agents] Anaphylaxis, Shortness Of Breath, Rash   IV contrast   Nsaids Other (See Comments)   Hx gastritis & gastric perforation       Medication List    STOP taking these medications   polyethylene glycol 236 g solution Commonly known as: GoLYTELY     TAKE these medications   docusate sodium 100 MG capsule Commonly known as: Colace Take 1 capsule (100 mg  total) by mouth 2 (two) times daily.   furosemide 20 MG tablet Commonly known as: LASIX Take 1 tablet (20 mg total) by mouth daily.   methocarbamol 500 MG tablet Commonly known as: ROBAXIN Take 1-2 tablets (500-1,000 mg total) by mouth every 8 (eight) hours as needed for muscle spasms.   ondansetron 4 MG disintegrating tablet Commonly known as: Zofran ODT Take 1 tablet (4 mg total) by mouth every 8 (eight) hours as needed for nausea or vomiting.   oxyCODONE 5 MG immediate release tablet Commonly known as: Oxy IR/ROXICODONE Take 1 tablet (5 mg total) by mouth every 4 (four) hours as needed for moderate pain. What changed:   when to take this  reasons to take this   polyethylene glycol 17 g packet Commonly known as: MIRALAX / GLYCOLAX Take 17 g by mouth 2 (two) times daily.   spironolactone 25 MG tablet Commonly known as: ALDACTONE Take 1 tablet (25 mg total) by mouth daily.         Follow-up Information    Karie Soda, MD. Go on 06/14/2020.   Specialty: General Surgery Why: Follow up appointment scheduled for 3:15 PM. Please arrive  30 min prior to appointment time. Bring photo ID and insurance information.  Contact information: [address] [place] [address] [phone]        Annamarie Major, CRNP Follow up.   Specialty: Nurse Practitioner Contact information: [address] [place] [address] [phone]        [facility] Follow up.   Contact information: [address] [place] [address] [phone]       Rachael Fee, MD Follow up on 05/17/2020.   Specialty: Gastroenterology Contact information: [address] [place] [address] [phone]               Signed: Barnetta Chapel, [facility] 05/12/2020, 10:45 AM Please see Amion for pager number during day hours 7:00am-4:30pm, 7-11:30am on Weekends

## 2020-05-13 LAB — CULTURE, BLOOD (ROUTINE X 2)
Culture: NO GROWTH
Culture: NO GROWTH
Special Requests: ADEQUATE

## 2020-05-16 ENCOUNTER — Emergency Department (HOSPITAL_COMMUNITY)
Admission: EM | Admit: 2020-05-16 | Discharge: 2020-05-16 | Disposition: A | Discharge status: Home / Self Care | Payer: BC Managed Care – PPO | Source: Home / Self Care

## 2020-05-16 ENCOUNTER — Encounter (HOSPITAL_COMMUNITY): Payer: Self-pay | Admitting: Emergency Medicine

## 2020-05-16 ENCOUNTER — Telehealth: Payer: Self-pay | Admitting: Family Medicine

## 2020-05-16 DIAGNOSIS — Z79899 Other long term (current) drug therapy: Secondary | ICD-10-CM | POA: Diagnosis not present

## 2020-05-16 DIAGNOSIS — K660 Peritoneal adhesions (postprocedural) (postinfection): Secondary | ICD-10-CM | POA: Diagnosis not present

## 2020-05-16 DIAGNOSIS — F321 Major depressive disorder, single episode, moderate: Secondary | ICD-10-CM | POA: Diagnosis not present

## 2020-05-16 DIAGNOSIS — Z5321 Procedure and treatment not carried out due to patient leaving prior to being seen by health care provider: Secondary | ICD-10-CM | POA: Insufficient documentation

## 2020-05-16 DIAGNOSIS — N179 Acute kidney failure, unspecified: Secondary | ICD-10-CM | POA: Diagnosis not present

## 2020-05-16 DIAGNOSIS — Z87440 Personal history of urinary (tract) infections: Secondary | ICD-10-CM | POA: Diagnosis not present

## 2020-05-16 DIAGNOSIS — Z87891 Personal history of nicotine dependence: Secondary | ICD-10-CM | POA: Diagnosis not present

## 2020-05-16 DIAGNOSIS — Z888 Allergy status to other drugs, medicaments and biological substances status: Secondary | ICD-10-CM | POA: Diagnosis not present

## 2020-05-16 DIAGNOSIS — L819 Disorder of pigmentation, unspecified: Secondary | ICD-10-CM | POA: Insufficient documentation

## 2020-05-16 DIAGNOSIS — I9581 Postprocedural hypotension: Secondary | ICD-10-CM | POA: Diagnosis not present

## 2020-05-16 DIAGNOSIS — R110 Nausea: Secondary | ICD-10-CM | POA: Diagnosis not present

## 2020-05-16 DIAGNOSIS — K921 Melena: Secondary | ICD-10-CM | POA: Insufficient documentation

## 2020-05-16 DIAGNOSIS — Z8619 Personal history of other infectious and parasitic diseases: Secondary | ICD-10-CM | POA: Diagnosis not present

## 2020-05-16 DIAGNOSIS — K602 Anal fissure, unspecified: Secondary | ICD-10-CM | POA: Diagnosis not present

## 2020-05-16 DIAGNOSIS — Z8632 Personal history of gestational diabetes: Secondary | ICD-10-CM | POA: Diagnosis not present

## 2020-05-16 DIAGNOSIS — Z98891 History of uterine scar from previous surgery: Secondary | ICD-10-CM | POA: Diagnosis not present

## 2020-05-16 DIAGNOSIS — Z9151 Personal history of suicidal behavior: Secondary | ICD-10-CM | POA: Diagnosis not present

## 2020-05-16 DIAGNOSIS — Z91041 Radiographic dye allergy status: Secondary | ICD-10-CM | POA: Diagnosis not present

## 2020-05-16 DIAGNOSIS — Z20822 Contact with and (suspected) exposure to covid-19: Secondary | ICD-10-CM | POA: Diagnosis not present

## 2020-05-16 DIAGNOSIS — S3720XD Unspecified injury of bladder, subsequent encounter: Secondary | ICD-10-CM | POA: Diagnosis not present

## 2020-05-16 DIAGNOSIS — N3289 Other specified disorders of bladder: Secondary | ICD-10-CM | POA: Diagnosis not present

## 2020-05-16 LAB — COMPREHENSIVE METABOLIC PANEL
ALT: 29 U/L (ref 0–44)
AST: 25 U/L (ref 15–41)
Albumin: 4.1 g/dL (ref 3.5–5.0)
Alkaline Phosphatase: 55 U/L (ref 38–126)
Anion gap: 8 (ref 5–15)
BUN: 15 mg/dL (ref 6–20)
CO2: 25 mmol/L (ref 22–32)
Calcium: 9.2 mg/dL (ref 8.9–10.3)
Chloride: 105 mmol/L (ref 98–111)
Creatinine, Ser: 0.7 mg/dL (ref 0.44–1.00)
GFR, Estimated: >60 mL/min (ref >60)
Glucose, Bld: 99 mg/dL (ref 70–99)
Potassium: 4.2 mmol/L (ref 3.5–5.1)
Sodium: 138 mmol/L (ref 135–145)
Total Bilirubin: 0.7 mg/dL (ref 0.3–1.2)
Total Protein: 8 g/dL (ref 6.5–8.1)

## 2020-05-16 LAB — CBC
HCT: 39.1 % (ref 36.0–46.0)
Hemoglobin: 13.2 g/dL (ref 12.0–15.0)
MCH: 30.2 pg (ref 26.0–34.0)
MCHC: 33.8 g/dL (ref 30.0–36.0)
MCV: 89.5 fL (ref 80.0–100.0)
Platelets: 251 10*3/uL (ref 150–400)
RBC: 4.37 MIL/uL (ref 3.87–5.11)
RDW: 11.9 % (ref 11.5–15.5)
WBC: 5.1 10*3/uL (ref 4.0–10.5)
nRBC: 0 % (ref 0.0–0.2)

## 2020-05-16 LAB — I-STAT BETA HCG BLOOD, ED (MC, WL, AP ONLY): I-stat hCG, quantitative: <5 m[IU]/mL (ref <5)

## 2020-05-16 NOTE — Telephone Encounter (Signed)
error 

## 2020-05-16 NOTE — ED Triage Notes (Signed)
Complains of bright red blood 1-2 teaspoons from her last bowel movement. Her Bm was soft and formed, mustard-brown in color. Reports taking stool softener. Denies lightheadedness, dizziness.

## 2020-05-17 ENCOUNTER — Inpatient Hospital Stay (HOSPITAL_COMMUNITY)
Admission: RE | Admit: 2020-05-17 | Discharge: 2020-05-20 | DRG: 655 | Disposition: A | Discharge status: Home / Self Care | Payer: BC Managed Care – PPO | Attending: Urology | Admitting: Urology

## 2020-05-17 ENCOUNTER — Ambulatory Visit (HOSPITAL_COMMUNITY): Payer: BC Managed Care – PPO | Admitting: Anesthesiology

## 2020-05-17 ENCOUNTER — Telehealth: Payer: Self-pay | Admitting: Gastroenterology

## 2020-05-17 ENCOUNTER — Other Ambulatory Visit: Payer: Self-pay | Admitting: Urology

## 2020-05-17 ENCOUNTER — Encounter (HOSPITAL_COMMUNITY): Payer: Self-pay | Admitting: Urology

## 2020-05-17 ENCOUNTER — Encounter (HOSPITAL_COMMUNITY)
Admission: RE | Disposition: A | Discharge status: Home / Self Care | Payer: Self-pay | Source: Ambulatory Visit | Attending: Urology

## 2020-05-17 ENCOUNTER — Ambulatory Visit: Payer: BC Managed Care – PPO | Admitting: Gastroenterology

## 2020-05-17 DIAGNOSIS — Z20822 Contact with and (suspected) exposure to covid-19: Secondary | ICD-10-CM | POA: Diagnosis present

## 2020-05-17 DIAGNOSIS — K602 Anal fissure, unspecified: Secondary | ICD-10-CM | POA: Diagnosis not present

## 2020-05-17 DIAGNOSIS — Z79899 Other long term (current) drug therapy: Secondary | ICD-10-CM

## 2020-05-17 DIAGNOSIS — Z8632 Personal history of gestational diabetes: Secondary | ICD-10-CM

## 2020-05-17 DIAGNOSIS — Z8619 Personal history of other infectious and parasitic diseases: Secondary | ICD-10-CM

## 2020-05-17 DIAGNOSIS — Z98891 History of uterine scar from previous surgery: Secondary | ICD-10-CM | POA: Diagnosis not present

## 2020-05-17 DIAGNOSIS — Z87891 Personal history of nicotine dependence: Secondary | ICD-10-CM

## 2020-05-17 DIAGNOSIS — Z9151 Personal history of suicidal behavior: Secondary | ICD-10-CM | POA: Diagnosis not present

## 2020-05-17 DIAGNOSIS — K660 Peritoneal adhesions (postprocedural) (postinfection): Secondary | ICD-10-CM | POA: Diagnosis present

## 2020-05-17 DIAGNOSIS — N3289 Other specified disorders of bladder: Secondary | ICD-10-CM | POA: Diagnosis not present

## 2020-05-17 DIAGNOSIS — Z888 Allergy status to other drugs, medicaments and biological substances status: Secondary | ICD-10-CM

## 2020-05-17 DIAGNOSIS — Z91041 Radiographic dye allergy status: Secondary | ICD-10-CM | POA: Diagnosis not present

## 2020-05-17 DIAGNOSIS — Z87440 Personal history of urinary (tract) infections: Secondary | ICD-10-CM | POA: Diagnosis not present

## 2020-05-17 DIAGNOSIS — S3720XD Unspecified injury of bladder, subsequent encounter: Secondary | ICD-10-CM | POA: Diagnosis not present

## 2020-05-17 DIAGNOSIS — N179 Acute kidney failure, unspecified: Secondary | ICD-10-CM | POA: Diagnosis not present

## 2020-05-17 DIAGNOSIS — F321 Major depressive disorder, single episode, moderate: Secondary | ICD-10-CM | POA: Diagnosis not present

## 2020-05-17 DIAGNOSIS — I9581 Postprocedural hypotension: Secondary | ICD-10-CM | POA: Diagnosis not present

## 2020-05-17 DIAGNOSIS — R110 Nausea: Secondary | ICD-10-CM | POA: Diagnosis not present

## 2020-05-17 HISTORY — PX: BLADDER REPAIR: SHX6721

## 2020-05-17 LAB — RESP PANEL BY RT-PCR (FLU A&B, COVID) ARPGX2
Influenza A by PCR: NEGATIVE
Influenza B by PCR: NEGATIVE
SARS Coronavirus 2 by RT PCR: NEGATIVE

## 2020-05-17 LAB — GLUCOSE, CAPILLARY: Glucose-Capillary: 137 mg/dL — ABNORMAL HIGH (ref 70–99)

## 2020-05-17 SURGERY — REPAIR, BLADDER
Anesthesia: General | Site: Abdomen

## 2020-05-17 MED ORDER — SODIUM CHLORIDE 0.45 % IV SOLN: INTRAVENOUS | Status: DC

## 2020-05-17 MED ORDER — CEFAZOLIN SODIUM-DEXTROSE 2-4 GM/100ML-% IV SOLN
2.0000 g | Freq: Once | INTRAVENOUS | Status: AC
  Administered 2020-05-17: 2 g via INTRAVENOUS
  Filled 2020-05-17: qty 100

## 2020-05-17 MED ORDER — SODIUM CHLORIDE 0.9 % IR SOLN
Status: DC | PRN
  Administered 2020-05-17: 3000 mL

## 2020-05-17 MED ORDER — ACETAMINOPHEN 325 MG PO TABS
650.0000 mg | ORAL_TABLET | ORAL | Status: DC | PRN
  Administered 2020-05-17 – 2020-05-20 (×7): 650 mg via ORAL
  Filled 2020-05-17 (×8): qty 2

## 2020-05-17 MED ORDER — DEXAMETHASONE SODIUM PHOSPHATE 10 MG/ML IJ SOLN
INTRAMUSCULAR | Status: AC
  Filled 2020-05-17: qty 1

## 2020-05-17 MED ORDER — SPIRONOLACTONE 25 MG PO TABS
25.0000 mg | ORAL_TABLET | Freq: Every day | ORAL | Status: DC
  Administered 2020-05-18 – 2020-05-20 (×2): 25 mg via ORAL
  Filled 2020-05-17 (×3): qty 1

## 2020-05-17 MED ORDER — OXYCODONE HCL 5 MG PO TABS
5.0000 mg | ORAL_TABLET | ORAL | Status: DC | PRN
  Administered 2020-05-17 – 2020-05-20 (×11): 5 mg via ORAL
  Filled 2020-05-17 (×12): qty 1

## 2020-05-17 MED ORDER — PROMETHAZINE HCL 25 MG/ML IJ SOLN: 6.2500 mg | INTRAMUSCULAR | Status: DC | PRN

## 2020-05-17 MED ORDER — MIDAZOLAM HCL 5 MG/5ML IJ SOLN
INTRAMUSCULAR | Status: DC | PRN
  Administered 2020-05-17: 2 mg via INTRAVENOUS

## 2020-05-17 MED ORDER — LACTATED RINGERS IV SOLN: INTRAVENOUS | Status: DC | PRN

## 2020-05-17 MED ORDER — MIDAZOLAM HCL 2 MG/2ML IJ SOLN
INTRAMUSCULAR | Status: AC
  Filled 2020-05-17: qty 2

## 2020-05-17 MED ORDER — FENTANYL CITRATE (PF) 100 MCG/2ML IJ SOLN
25.0000 ug | INTRAMUSCULAR | Status: DC
  Administered 2020-05-17: 75 ug via INTRAVENOUS
  Administered 2020-05-17: 25 ug via INTRAVENOUS

## 2020-05-17 MED ORDER — OXYCODONE HCL 5 MG/5ML PO SOLN: 5.0000 mg | Freq: Once | ORAL | Status: DC | PRN

## 2020-05-17 MED ORDER — STERILE WATER FOR IRRIGATION IR SOLN
Status: DC | PRN
  Administered 2020-05-17: 30 mL

## 2020-05-17 MED ORDER — LIDOCAINE 2% (20 MG/ML) 5 ML SYRINGE
INTRAMUSCULAR | Status: DC | PRN
  Administered 2020-05-17: 60 mg via INTRAVENOUS

## 2020-05-17 MED ORDER — LACTATED RINGERS IV SOLN: INTRAVENOUS | Status: DC

## 2020-05-17 MED ORDER — ONDANSETRON 4 MG PO TBDP
4.0000 mg | ORAL_TABLET | Freq: Three times a day (TID) | ORAL | Status: DC | PRN
  Administered 2020-05-18 – 2020-05-20 (×2): 4 mg via ORAL
  Filled 2020-05-17 (×2): qty 1

## 2020-05-17 MED ORDER — DEXAMETHASONE SODIUM PHOSPHATE 10 MG/ML IJ SOLN
INTRAMUSCULAR | Status: DC | PRN
  Administered 2020-05-17: 5 mg via INTRAVENOUS

## 2020-05-17 MED ORDER — ROCURONIUM BROMIDE 10 MG/ML (PF) SYRINGE
PREFILLED_SYRINGE | INTRAVENOUS | Status: DC | PRN
  Administered 2020-05-17: 40 mg via INTRAVENOUS
  Administered 2020-05-17: 10 mg via INTRAVENOUS

## 2020-05-17 MED ORDER — SUGAMMADEX SODIUM 200 MG/2ML IV SOLN
INTRAVENOUS | Status: DC | PRN
  Administered 2020-05-17: 114.4 mg via INTRAVENOUS

## 2020-05-17 MED ORDER — HYDROMORPHONE HCL 1 MG/ML IJ SOLN
0.5000 mg | INTRAMUSCULAR | Status: DC | PRN
  Administered 2020-05-17 – 2020-05-18 (×4): 1 mg via INTRAVENOUS
  Administered 2020-05-19: 0.5 mg via INTRAVENOUS
  Administered 2020-05-20: 1 mg via INTRAVENOUS
  Administered 2020-05-20: 0.5 mg via INTRAVENOUS
  Filled 2020-05-17 (×7): qty 1

## 2020-05-17 MED ORDER — ACETAMINOPHEN 10 MG/ML IV SOLN: 1000.0000 mg | Freq: Once | INTRAVENOUS | Status: DC | PRN

## 2020-05-17 MED ORDER — PROPOFOL 10 MG/ML IV BOLUS
INTRAVENOUS | Status: DC | PRN
  Administered 2020-05-17: 200 mg via INTRAVENOUS

## 2020-05-17 MED ORDER — PROPOFOL 10 MG/ML IV BOLUS
INTRAVENOUS | Status: AC
  Filled 2020-05-17: qty 20

## 2020-05-17 MED ORDER — OXYCODONE HCL 5 MG PO TABS: 5.0000 mg | ORAL_TABLET | Freq: Once | ORAL | Status: DC | PRN

## 2020-05-17 MED ORDER — ONDANSETRON HCL 4 MG/2ML IJ SOLN
INTRAMUSCULAR | Status: AC
  Filled 2020-05-17: qty 2

## 2020-05-17 MED ORDER — FENTANYL CITRATE (PF) 250 MCG/5ML IJ SOLN
INTRAMUSCULAR | Status: DC | PRN
  Administered 2020-05-17 (×3): 50 ug via INTRAVENOUS

## 2020-05-17 MED ORDER — CEFAZOLIN SODIUM-DEXTROSE 1-4 GM/50ML-% IV SOLN
1.0000 g | Freq: Three times a day (TID) | INTRAVENOUS | Status: DC
  Administered 2020-05-18 – 2020-05-20 (×8): 1 g via INTRAVENOUS
  Filled 2020-05-17 (×8): qty 50

## 2020-05-17 MED ORDER — FENTANYL CITRATE (PF) 250 MCG/5ML IJ SOLN
INTRAMUSCULAR | Status: AC
  Filled 2020-05-17: qty 5

## 2020-05-17 MED ORDER — ROCURONIUM BROMIDE 10 MG/ML (PF) SYRINGE
PREFILLED_SYRINGE | INTRAVENOUS | Status: AC
  Filled 2020-05-17: qty 10

## 2020-05-17 MED ORDER — FENTANYL CITRATE (PF) 100 MCG/2ML IJ SOLN
INTRAMUSCULAR | Status: AC
  Filled 2020-05-17: qty 2

## 2020-05-17 MED ORDER — METHOCARBAMOL 500 MG PO TABS
500.0000 mg | ORAL_TABLET | Freq: Three times a day (TID) | ORAL | Status: DC | PRN
  Administered 2020-05-18 – 2020-05-19 (×3): 500 mg via ORAL
  Filled 2020-05-17 (×3): qty 1

## 2020-05-17 MED ORDER — INDIGOTINDISULFONATE SODIUM 8 MG/ML IJ SOLN
INTRAMUSCULAR | Status: AC
  Filled 2020-05-17: qty 5

## 2020-05-17 MED ORDER — 0.9 % SODIUM CHLORIDE (POUR BTL) OPTIME
TOPICAL | Status: DC | PRN
  Administered 2020-05-17: 1000 mL

## 2020-05-17 MED ORDER — LIDOCAINE HCL (PF) 2 % IJ SOLN
INTRAMUSCULAR | Status: AC
  Filled 2020-05-17: qty 5

## 2020-05-17 MED ORDER — OXYBUTYNIN CHLORIDE 5 MG PO TABS
5.0000 mg | ORAL_TABLET | Freq: Three times a day (TID) | ORAL | Status: DC | PRN
  Administered 2020-05-17 – 2020-05-18 (×2): 5 mg via ORAL
  Filled 2020-05-17 (×3): qty 1

## 2020-05-17 MED ORDER — ONDANSETRON HCL 4 MG/2ML IJ SOLN: 4.0000 mg | Freq: Once | INTRAMUSCULAR | Status: DC

## 2020-05-17 MED ORDER — FENTANYL CITRATE (PF) 100 MCG/2ML IJ SOLN
25.0000 ug | INTRAMUSCULAR | Status: DC | PRN
  Administered 2020-05-17 (×3): 50 ug via INTRAVENOUS

## 2020-05-17 SURGICAL SUPPLY — 45 items
BAG URINE DRAIN 2000ML AR STRL (UROLOGICAL SUPPLIES) ×2 IMPLANT
BLADE EXTENDED COATED 6.5IN (ELECTRODE) IMPLANT
BLADE HEX COATED 2.75 (ELECTRODE) ×2 IMPLANT
CATH FOLEY 3WAY 30CC 20FR (CATHETERS) ×2 IMPLANT
CHLORAPREP W/TINT 26 (MISCELLANEOUS) ×2 IMPLANT
CLIP VESOLOCK LG 6/CT PURPLE (CLIP) IMPLANT
CLIP VESOLOCK MED LG 6/CT (CLIP) IMPLANT
COVER SURGICAL LIGHT HANDLE (MISCELLANEOUS) ×2 IMPLANT
COVER WAND RF STERILE (DRAPES) IMPLANT
DRAIN CHANNEL 10F 3/8 F FF (DRAIN) ×2 IMPLANT
DRAPE LAPAROTOMY TRNSV 102X78 (DRAPES) ×2 IMPLANT
DRAPE WARM FLUID 44X44 (DRAPES) IMPLANT
DRSG TELFA 3X8 NADH (GAUZE/BANDAGES/DRESSINGS) ×2 IMPLANT
ELECT REM PT RETURN 15FT ADLT (MISCELLANEOUS) ×2 IMPLANT
EVACUATOR SILICONE 100CC (DRAIN) ×2 IMPLANT
GAUZE 4X4 16PLY RFD (DISPOSABLE) ×2 IMPLANT
GAUZE SPONGE 4X4 12PLY STRL (GAUZE/BANDAGES/DRESSINGS) ×2 IMPLANT
GLOVE BIOGEL M STRL SZ7.5 (GLOVE) ×4 IMPLANT
GOWN STRL REUS W/TWL XL LVL3 (GOWN DISPOSABLE) ×4 IMPLANT
KIT BASIN OR (CUSTOM PROCEDURE TRAY) ×2 IMPLANT
KIT TURNOVER KIT A (KITS) IMPLANT
NS IRRIG 1000ML POUR BTL (IV SOLUTION) ×2 IMPLANT
PACK GENERAL/GYN (CUSTOM PROCEDURE TRAY) ×2 IMPLANT
PENCIL SMOKE EVACUATOR (MISCELLANEOUS) IMPLANT
PLUG CATH AND CAP STER (CATHETERS) ×2 IMPLANT
SET IRRIG Y TYPE TUR BLADDER L (SET/KITS/TRAYS/PACK) ×2 IMPLANT
SOL PREP POV-IOD 4OZ 10% (MISCELLANEOUS) ×2 IMPLANT
SPONGE LAP 18X18 RF (DISPOSABLE) IMPLANT
SPONGE LAP 4X18 RFD (DISPOSABLE) IMPLANT
STAPLER VISISTAT 35W (STAPLE) ×2 IMPLANT
SUT CHROMIC 0 UR 5 27 (SUTURE) ×2 IMPLANT
SUT CHROMIC 2 0 UR 5 27 (SUTURE) IMPLANT
SUT ETHILON 2 0 PS N (SUTURE) ×2 IMPLANT
SUT ETHILON 4 0 PS 2 18 (SUTURE) ×2 IMPLANT
SUT PDS AB 1 CTX 36 (SUTURE) ×2 IMPLANT
SUT PDS AB 1 CTXB1 36 (SUTURE) ×2 IMPLANT
SUT SILK 4 0 SH CR/8 (SUTURE) ×4 IMPLANT
SUT VIC AB 1 CTX 36 (SUTURE)
SUT VIC AB 1 CTX36XBRD ANBCTR (SUTURE) IMPLANT
SUT VIC AB 2-0 SH 27 (SUTURE) ×4
SUT VIC AB 2-0 SH 27X BRD (SUTURE) ×2 IMPLANT
SYR 30ML LL (SYRINGE) ×2 IMPLANT
TAPE CLOTH SURG 4X10 WHT LF (GAUZE/BANDAGES/DRESSINGS) ×2 IMPLANT
TOWEL OR 17X26 10 PK STRL BLUE (TOWEL DISPOSABLE) ×2 IMPLANT
WATER STERILE IRR 1000ML POUR (IV SOLUTION) ×2 IMPLANT

## 2020-05-17 NOTE — Op Note (Signed)
Preoperative diagnosis: Intraperitoneal bladder rupture  Postoperative diagnosis: Same  Procedure: Exploratory laparotomy, cystorrhaphy  Surgeon: Talisa Petrak  Anesthesia: General endotracheal  Estimated blood loss: Less than 100 mL  Complications: None  Specimen: None  Drains: 10 [place] Blake drain in left lower quadrant, in anterior bladder area  Indications: 34 year old female with recent history of urinary retention, abdominal ascites, with symptoms beginning in late October this year.  She has failed multiple voiding trials.  She presented to our office for urodynamics today.  The video urodynamics portion of the study revealed intraperitoneal extravasation of the contrast which was verified with CT of the pelvis.  She presents at this time for urgent pelvic exploration and cystorrhaphy.  I have discussed the procedure with the patient as well as expected outcomes and risks and complications.  She understands these and desires to proceed.  Findings: The patient had had 2 prior cesarean deliveries and recent laparoscopic surgery.  There was significant perivesical adhesions and finding the peritoneal cavity was quite difficult.  Once dissected and the bladder opened anteriorly, the bladder lesion was at the left posterior dome area.  This was approximately 1 cm in size.  The bladder wall was quite thin.  There were multiple adhesions between small bowel and the dome of the bladder/uterus.  The remaining bladder urothelium appeared normal outside of the lesion.  There was a significant amount of intra-abdominal fluid, most likely urine.  Description of procedure: The patient was properly identified in the holding area.  She was taken to the operating room where general endotracheal anesthetic was administered.  She was placed in the supine position.  Her old catheter was removed, new Foley catheter, 20 [place] three-way was then placed.  Irrigation apparatus was connected to this.  Drainage bag  was placed.  Her abdomen and perineal area were then prepped.  It was then draped, and proper timeout performed.  I then incised along the old Pfannenstiel incision, approximately 12 cm.  Dissection was carried down to anterior rectus fascia with electrocautery, the fascia was then incised.  Rectus muscle was incised as well as the posterior rectus fascia which was quite attenuated.  There was significant scarring in this area and I could not separate the rectus muscle from the fascia.  It could not be retracted so it was incised.  Dissection was then carried down to the preperitoneal area.  Unfortunately, there was significant scarring in this area and it was quite difficult to find the edge of the peritoneum.  Because I did not want to cause any possible intestinal injury, I palpated the catheter through the anterior bladder wall.  The bladder was then incised anteriorly in a vertical fashion.  Access was gained to the bladder in this manner.  With a hand in the bladder, with my fingers pushing towards the dome, I was able to identify the edges of the bladder which were then dissected free from the peritoneal attachments.  At this point, I was able to find the peritoneal cavity.  This aided in dissection.  The bladder was dissected to the right, to the left and then posteriorly.  There were significant adhesions between the bladder and the left dome and the anterior uterus.  These were quite dense and were eventually taken down.  It was around this area where the bladder perforation was present.  The edges were somewhat smooth, so I could tell that this was somewhat of an old injury.  Rather than close this perforation, I decided to resect  the area around this, for a distance of approximately 1 cm circumferentially.  This area of the bladder was then disposed of.  The remaining bladder edges were all normal.  As dictated before, the bladder wall was quite thin which I think obviously played some part in the  patient's pathologic process here.  The pelvis was then irrigated copiously with saline.  There was a significant amount of fluid deep in the pelvis which was previously irrigated free with a pull tip sucker.  There was a small serosal injury to piece of terminal ileum which was oversewn with 4-0 silk pop-off.  No obvious injury to the uterus or other intestinal contents were noted.  At this point, the bladder was then closed with a 2 layer closure, both running layers of 2-0 Vicryl.  Inner layer was the muscularis and urothelium, the outer layer was the seromuscular layer.  Once these closures were complete, the bladder was filled with saline through the irrigation apparatus.  There was no obvious leak.  The pelvis was then again irrigated with saline.  The posterior and then anterior rectus sheath were closed with separate running sutures of #1 Vicryl.  Prior to closure, drain was brought out the left side of this closure with the drain placed anterior to the bladder.  It was brought out above into the left of the Pfannenstiel closure.  Following fascial closure, subcutaneous tissue was reapproximated with a running 0 chromic suture.  Skin edges were then reapproximated using staples.  Dry sterile dressings were placed over top of a Telfa dressing.  At this point the procedure was terminated.  The patient was awakened and taken to the PACU in stable condition.  She tolerated the procedure well.  Sponge needle and instrument counts were correct x2.

## 2020-05-17 NOTE — Transfer of Care (Signed)
Immediate Anesthesia Transfer of Care Note  Patient: Kim Pacheco  Procedure(s) Performed: EXPLORATORY LAPAROTOMY WITH CYSTORRHAPHY (N/A Abdomen)  Patient Location: PACU  Anesthesia Type:General  Level of Consciousness: drowsy and patient cooperative  Airway & Oxygen Therapy: Patient Spontanous Breathing and Patient connected to face mask oxygen  Post-op Assessment: Report given to RN and Post -op Vital signs reviewed and stable  Post vital signs: Reviewed and stable  Last Vitals:  Vitals Value Taken Time  BP 124/76 05/17/20 1953  Temp 36.4 C 05/17/20 1953  Pulse 90 05/17/20 1953  Resp 20 05/17/20 1953  SpO2 100 % 05/17/20 1953  Vitals shown include unvalidated device data.  Last Pain:  Vitals:   05/17/20 1745  TempSrc:   PainSc: 7       Patients Stated Pain Goal: 3 (05/17/20 1630)  Complications: No complications documented.

## 2020-05-17 NOTE — Telephone Encounter (Signed)
Spoke with patient, she was calling because she is currently at the urologist office, pt states that she was previously seen in hospital for abdominal pain that they thought was due to constipation. Pt states that the urologist has found a tear in her bladder and they are working on getting her scheduled for surgery. Advised patient that we can reschedule appointment but she does need to follow up with [place]. Appointment has been rescheduled to Monday, 05/22/20 at 3:40 PM. Patient verbalized understanding of all information and had no other concerns at the end of the call.

## 2020-05-17 NOTE — Anesthesia Procedure Notes (Signed)
Procedure Name: Intubation Date/Time: 05/17/2020 5:32 PM Performed by: Elyn Peers, CRNA Pre-anesthesia Checklist: Patient identified, Emergency Drugs available, Suction available, Patient being monitored and Timeout performed Patient Re-evaluated:Patient Re-evaluated prior to induction Oxygen Delivery Method: Circle system utilized Preoxygenation: Pre-oxygenation with 100% oxygen Induction Type: IV induction Ventilation: Mask ventilation without difficulty Laryngoscope Size: Miller and 3 Grade View: Grade I Tube type: Oral Tube size: 7.0 mm Airway Equipment and Method: Stylet Placement Confirmation: ETT inserted through vocal cords under direct vision,  positive ETCO2 and breath sounds checked- equal and bilateral Secured at: 22 cm Tube secured with: Tape Dental Injury: Teeth and Oropharynx as per pre-operative assessment

## 2020-05-17 NOTE — Anesthesia Preprocedure Evaluation (Addendum)
Anesthesia Evaluation  Patient identified by MRN, date of birth, ID band Patient awake    Reviewed: Allergy & Precautions, NPO status , Patient's Chart, lab work & pertinent test results  Airway Mallampati: II  TM Distance: >3 FB Neck ROM: Full    Dental no notable dental hx.    Pulmonary former smoker,    Pulmonary exam normal breath sounds clear to auscultation       Cardiovascular Normal cardiovascular exam Rhythm:Regular Rate:Normal  ECG: SB, rate 59   Neuro/Psych PSYCHIATRIC DISORDERS Anxiety Depression negative neurological ROS     GI/Hepatic negative GI ROS, (+)   ascites  substance abuse  , Hepatitis -, BPortal hypertension   Endo/Other  diabetes  Renal/GU Renal disease     Musculoskeletal negative musculoskeletal ROS (+) narcotic dependent  Abdominal   Peds  Hematology negative hematology ROS (+)   Anesthesia Other Findings REPAIR OF BLADDER   Reproductive/Obstetrics                           Anesthesia Physical Anesthesia Plan  ASA: IV  Anesthesia Plan: General   Post-op Pain Management:    Induction: Intravenous  PONV Risk Score and Plan: 4 or greater and Ondansetron, Dexamethasone, Midazolam and Treatment may vary due to age or medical condition  Airway Management Planned: Oral ETT  Additional Equipment:   Intra-op Plan:   Post-operative Plan: Extubation in OR  Informed Consent: I have reviewed the patients History and Physical, chart, labs and discussed the procedure including the risks, benefits and alternatives for the proposed anesthesia with the patient or authorized representative who has indicated his/her understanding and acceptance.     Dental advisory given  Plan Discussed with: CRNA  Anesthesia Plan Comments:        Anesthesia Quick Evaluation

## 2020-05-17 NOTE — Progress Notes (Signed)
D; patient placement called that no 4E/W bed for tonight, told med-surg bed available only. Kim Pacheco

## 2020-05-17 NOTE — H&P (Signed)
H&P  Chief Complaint: Lower abdominal pain  History of Present Illness: Kim Pacheco is a 34 y.o. year old female with a 6-week history of lower abdominal pain associated with ascites of unknown origin along with recurrent episodes of urinary retention.  She has a prior medical history of hepatitis B associated with ascites and liver dysfunction.  She was seen in early November of 2021 due to worsening abdominal pain and was found to have abdominal ascites associated with acute renal failure.  After an extensive GI and general surgery evaluation, no identifiable source of her ongoing abdominal pain could be identified.  During her hospitalization, the patient went into urinary retention and required an indwelling Foley catheter.  She failed multiple voiding trials as an inpatient as well as outpatient despite prolonged Foley catheterization and tamsulosin.  The patient states that prior to her hospital admission in November, she was voiding without difficulty and felt like she was adequately emptying her bladder.  She denies a prior history of acute urinary retention and also denies any neurologic symptoms such as changes in vision or paresthesias.  While performing urodynamics to assess her overall bladder function and try to delineate her recurrent episodes of urinary retention, the patient was found to have contrast extravasation from the dome of the bladder.  During the test, she experienced sharp lower abdominal pain.  She eventually had a CT cystogram that confirmed an intraperitoneal bladder perforation.    Past Medical History:  Diagnosis Date  . Abdominal pain    started in mid abd and spread to left side/on and off/since Jan 2020/ pt states, she has fluid in abd/unknown  . Abnormal Pap smear    normal 06/2013  . Anxiety   . Constipation    hx of  . Depression    suicide attempt in college  . Diabetes mellitus without complication (HCC)    gestational, no issues since pregnancy  per her report - resolved  . Gestational diabetes    /no meds now - resolved  . Hepatitis    Hep B, managed by Dr. Kinnie Scales  . History of chicken pox   . History of suicide attempt 2003   Attempt in college  . History of UTI   . Vaginal Pap smear, abnormal     Past Surgical History:  Procedure Laterality Date  . BIOPSY  05/10/2020   Procedure: BIOPSY;  Surgeon: Benancio Deeds, MD;  Location: [facility];  Service: Gastroenterology;;  . CESAREAN SECTION N/A 01/26/2013   Procedure: Primary Cesarean Section Delivery Baby  Boy @ 0017, Apgars 4/8/9;  Surgeon: Freddrick March. Tenny Craw, MD;  Location: [facility];  Service: Obstetrics;  Laterality: N/A;  . CESAREAN SECTION N/A 11/18/2017   Procedure: CESAREAN SECTION;  Surgeon: Olivia Mackie, MD;  Location: [facility];  Service: Obstetrics;  Laterality: N/A;  . COLONOSCOPY  2020  . DIAGNOSTIC LAPAROSCOPIC LIVER BIOPSY N/A 05/07/2020   Procedure: DIAGNOSTIC LAPAROSCOPIC;  Surgeon: Karie Soda, MD;  Location: [facility];  Service: General;  Laterality: N/A;  . DIAGNOSTIC LAPAROSCOPY  08/25/2018   Dr Clifton James with Ob/Gyn3/2020  . ESOPHAGOGASTRODUODENOSCOPY (EGD) WITH PROPOFOL N/A 05/10/2020   Procedure: ESOPHAGOGASTRODUODENOSCOPY (EGD) WITH PROPOFOL;  Surgeon: Benancio Deeds, MD;  Location: [facility];  Service: Gastroenterology;  Laterality: N/A;  . ESOPHAGOGASTRODUODENOSCOPY ENDOSCOPY    . IR TRANSCATHETER BX  04/18/2020  . IR US GUIDE VASC ACCESS RIGHT  04/18/2020  . IR VENOGRAM HEPATIC W HEMODYNAMIC EVALUATION  04/18/2020  . NASAL SINUS SURGERY  1996  . WISDOM TOOTH EXTRACTION      Home Medications:  Current Meds  Medication Sig  . furosemide (LASIX) 20 MG tablet Take 1 tablet (20 mg total) by mouth daily.  . methocarbamol (ROBAXIN) 500 MG tablet Take 1-2 tablets (500-1,000 mg total) by mouth every 8 (eight) hours as needed for muscle spasms. (Patient taking differently: Take 500 mg by mouth every 8 (eight) hours as needed for muscle  spasms. )  . ondansetron (ZOFRAN ODT) 4 MG disintegrating tablet Take 1 tablet (4 mg total) by mouth every 8 (eight) hours as needed for nausea or vomiting.  Marland Kitchen. oxyCODONE (OXY IR/ROXICODONE) 5 MG immediate release tablet Take 1 tablet (5 mg total) by mouth every 4 (four) hours as needed for moderate pain.  . polyethylene glycol (MIRALAX / GLYCOLAX) 17 g packet Take 17 g by mouth 2 (two) times daily.  Marland Kitchen. spironolactone (ALDACTONE) 25 MG tablet Take 1 tablet (25 mg total) by mouth daily.    Allergies:  Allergies  Allergen Reactions  . Contrast Media [Iodinated Diagnostic Agents] Anaphylaxis, Shortness Of Breath and Rash    IV contrast   . Nsaids Other (See Comments)    Hx gastritis & gastric perforation   . Prednisone & Diphenhydramine Other (See Comments)    Don't like to take it    Family History  Adopted: Yes  Family history unknown: Yes    Social History:  reports that she quit smoking about 8 years ago. Her smoking use included cigarettes. She smoked 1.00 pack per day. She has never used smokeless tobacco. She reports current alcohol use of about 5.0 standard drinks of alcohol per week. She reports previous drug use. Drug: Marijuana.  ROS: A complete review of systems was performed.  All systems are negative except for pertinent findings as noted.  Physical Exam:  Vital signs in last 24 hours: Temp:  [98.5 F (36.9 C)] 98.5 F (36.9 C) (12/08 1535) Pulse Rate:  [102] 102 (12/08 1535) Resp:  [16] 16 (12/08 1535) BP: (126)/(89) 126/89 (12/08 1535) SpO2:  [100 %] 100 % (12/08 1535) Constitutional:  Alert and oriented, No acute distress Cardiovascular: Regular rate and rhythm, No JVD Respiratory: Normal respiratory effort, Lungs clear bilaterally GI: Abdomen tender with rebound in all quadrants  GU: Foley catheter in place and draining clear-yellow urine Lymphatic: No lymphadenopathy Neurologic: Grossly intact, no focal deficits Psychiatric: Normal mood and  affect   Laboratory Data:  Recent Labs    05/16/20 1640  WBC 5.1  HGB 13.2  HCT 39.1  PLT 251    Recent Labs    05/16/20 1640  NA 138  K 4.2  CL 105  GLUCOSE 99  BUN 15  CALCIUM 9.2  CREATININE 0.70     Results for orders placed or performed during the hospital encounter of 05/17/20 (from the past 24 hour(s))  Resp Panel by RT-PCR (Flu A&B, Covid) Nasopharyngeal Swab     Status: None   Collection Time: 05/17/20  3:12 PM   Specimen: Nasopharyngeal Swab; Nasopharyngeal(NP) swabs in vial transport medium  Result Value Ref Range   SARS Coronavirus 2 by RT PCR NEGATIVE NEGATIVE   Influenza A by PCR NEGATIVE NEGATIVE   Influenza B by PCR NEGATIVE NEGATIVE   Recent Results (from the past 240 hour(s))  Resp Panel by RT-PCR (Flu A&B, Covid) Nasopharyngeal Swab     Status: None   Collection Time: 05/07/20  5:21 PM   Specimen: Nasopharyngeal Swab; Nasopharyngeal(NP) swabs in vial transport  medium  Result Value Ref Range Status   SARS Coronavirus 2 by RT PCR NEGATIVE NEGATIVE Final    Comment: (NOTE) SARS-CoV-2 target nucleic acids are NOT DETECTED.  The SARS-CoV-2 RNA is generally detectable in upper respiratory specimens during the acute phase of infection. The lowest concentration of SARS-CoV-2 viral copies this assay can detect is 138 copies/mL. A negative result does not preclude SARS-Cov-2 infection and should not be used as the sole basis for treatment or other patient management decisions. A negative result may occur with  improper specimen collection/handling, submission of specimen other than nasopharyngeal swab, presence of viral mutation(s) within the areas targeted by this assay, and inadequate number of viral copies(<138 copies/mL). A negative result must be combined with clinical observations, patient history, and epidemiological information. The expected result is Negative.  Fact Sheet for Patients:  BloggerCourse.com  Fact Sheet  for Healthcare Providers:  SeriousBroker.it  This test is no t yet approved or cleared by the [place] FDA and  has been authorized for detection and/or diagnosis of SARS-CoV-2 by FDA under an Emergency Use Authorization (EUA). This EUA will remain  in effect (meaning this test can be used) for the duration of the COVID-19 declaration under Section 564(b)(1) of the Act, 21 U.S.C.section 360bbb-3(b)(1), unless the authorization is terminated  or revoked sooner.       Influenza A by PCR NEGATIVE NEGATIVE Final   Influenza B by PCR NEGATIVE NEGATIVE Final    Comment: (NOTE) The Xpert Xpress SARS-CoV-2/FLU/RSV plus assay is intended as an aid in the diagnosis of influenza from Nasopharyngeal swab specimens and should not be used as a sole basis for treatment. Nasal washings and aspirates are unacceptable for Xpert Xpress SARS-CoV-2/FLU/RSV testing.  Fact Sheet for Patients: BloggerCourse.com  Fact Sheet for Healthcare Providers: SeriousBroker.it  This test is not yet approved or cleared by the [place] FDA and has been authorized for detection and/or diagnosis of SARS-CoV-2 by FDA under an Emergency Use Authorization (EUA). This EUA will remain in effect (meaning this test can be used) for the duration of the COVID-19 declaration under Section 564(b)(1) of the Act, 21 U.S.C. section 360bbb-3(b)(1), unless the authorization is terminated or revoked.  Performed at [facility], [address], [place], [place] [address]   Urine culture     Status: None   Collection Time: 05/07/20  6:52 PM   Specimen: Urine, Clean Catch  Result Value Ref Range Status   Specimen Description   Final    URINE, CLEAN CATCH Performed at [facility], [address] [place], [place] [address]    Special Requests   Final    NONE Performed at [facility], [address] [place], [place] [address]    Culture   Final    NO GROWTH Performed at [facility], [address] [place], [place] [address]    Report Status 05/09/2020 FINAL  Final  Culture, blood (routine x 2)     Status: None   Collection Time: 05/07/20 10:20 PM   Specimen: BLOOD  Result Value Ref Range Status   Specimen Description   Final    BLOOD RIGHT ANTECUBITAL Performed at [facility], [address] [place], [place] [address]    Special Requests   Final    BOTTLES DRAWN AEROBIC AND ANAEROBIC Blood Culture adequate volume Performed at [facility], [address] [place], [place] [address]    Culture  Final    NO GROWTH 5 DAYS Performed at [facility], [address], [place], [place] [address]    Report Status 05/13/2020 FINAL  Final  Culture, blood (routine x 2)     Status: None   Collection Time: 05/08/20  3:19 AM   Specimen: BLOOD  Result Value Ref Range Status   Specimen Description   Final    BLOOD BLOOD RIGHT HAND Performed at [facility], [address] [place], [place] [address]    Special Requests   Final    BOTTLES DRAWN AEROBIC ONLY Blood Culture results may not be optimal due to an inadequate volume of blood received in culture bottles Performed at [facility], [address], [place], [place] [address]    Culture   Final    NO GROWTH 5 DAYS Performed at [facility], [address], [place], [place] [address]    Report Status 05/13/2020 FINAL  Final  Resp Panel by RT-PCR (Flu A&B, Covid) Nasopharyngeal Swab     Status: None   Collection Time: 05/17/20  3:12 PM   Specimen: Nasopharyngeal Swab; Nasopharyngeal(NP) swabs in vial transport medium  Result Value Ref Range Status   SARS Coronavirus 2 by RT PCR NEGATIVE NEGATIVE Final    Comment: (NOTE) SARS-CoV-2 target nucleic acids are NOT DETECTED.  The SARS-CoV-2 RNA is generally detectable in  upper respiratory specimens during the acute phase of infection. The lowest concentration of SARS-CoV-2 viral copies this assay can detect is 138 copies/mL. A negative result does not preclude SARS-Cov-2 infection and should not be used as the sole basis for treatment or other patient management decisions. A negative result may occur with  improper specimen collection/handling, submission of specimen other than nasopharyngeal swab, presence of viral mutation(s) within the areas targeted by this assay, and inadequate number of viral copies(<138 copies/mL). A negative result must be combined with clinical observations, patient history, and epidemiological information. The expected result is Negative.  Fact Sheet for Patients:  BloggerCourse.com  Fact Sheet for Healthcare Providers:  SeriousBroker.it  This test is no t yet approved or cleared by the [place] FDA and  has been authorized for detection and/or diagnosis of SARS-CoV-2 by FDA under an Emergency Use Authorization (EUA). This EUA will remain  in effect (meaning this test can be used) for the duration of the COVID-19 declaration under Section 564(b)(1) of the Act, 21 U.S.C.section 360bbb-3(b)(1), unless the authorization is terminated  or revoked sooner.       Influenza A by PCR NEGATIVE NEGATIVE Final   Influenza B by PCR NEGATIVE NEGATIVE Final    Comment: (NOTE) The Xpert Xpress SARS-CoV-2/FLU/RSV plus assay is intended as an aid in the diagnosis of influenza from Nasopharyngeal swab specimens and should not be used as a sole basis for treatment. Nasal washings and aspirates are unacceptable for Xpert Xpress SARS-CoV-2/FLU/RSV testing.  Fact Sheet for Patients: BloggerCourse.com  Fact Sheet for Healthcare Providers: SeriousBroker.it  This test is not yet approved or cleared by the [place] FDA and has been  authorized for detection and/or diagnosis of SARS-CoV-2 by FDA under an Emergency Use Authorization (EUA). This EUA will remain in effect (meaning this test can be used) for the duration of the COVID-19 declaration under Section 564(b)(1) of the Act, 21 U.S.C. section 360bbb-3(b)(1), unless the authorization is terminated or revoked.  Performed at [facility], [address], [place], [place] [address]     Renal  Function: Recent Labs    05/12/20 0739 05/16/20 1640  CREATININE 0.72 0.70   Estimated Creatinine Clearance: 74.9 mL/min (by C-G formula based on SCr of 0.7 mg/dL).  Radiologic Imaging: No results found.  Assessment:  34 year old female with an intraperitoneal bladder perforation  Plan:  Dr. Retta Diones is planning on an open cystorrhaphy to repair her intra-abdominal bladder perforation.  I briefly discussed the risk, benefits and alternatives with the patient.  She voices understanding and wishes to proceed.  Rhoderick Moody, MD 05/17/2020, 4:55 PM  [facility] Pager: [phone]

## 2020-05-18 ENCOUNTER — Encounter (HOSPITAL_COMMUNITY): Payer: Self-pay | Admitting: Urology

## 2020-05-18 LAB — HEMOGLOBIN AND HEMATOCRIT, BLOOD
HCT: 33 % — ABNORMAL LOW (ref 36.0–46.0)
HCT: 32.6 % — ABNORMAL LOW (ref 36.0–46.0)
Hemoglobin: 11.3 g/dL — ABNORMAL LOW (ref 12.0–15.0)
Hemoglobin: 11 g/dL — ABNORMAL LOW (ref 12.0–15.0)

## 2020-05-18 MED ORDER — SODIUM CHLORIDE 0.9 % IV BOLUS
500.0000 mL | Freq: Once | INTRAVENOUS | Status: AC
  Administered 2020-05-18: 500 mL via INTRAVENOUS

## 2020-05-18 MED ORDER — PROMETHAZINE HCL 25 MG/ML IJ SOLN
12.5000 mg | Freq: Four times a day (QID) | INTRAMUSCULAR | Status: DC | PRN
  Administered 2020-05-18 – 2020-05-20 (×2): 12.5 mg via INTRAVENOUS
  Filled 2020-05-18 (×2): qty 1

## 2020-05-18 MED ORDER — SIMETHICONE 80 MG PO CHEW
80.0000 mg | CHEWABLE_TABLET | Freq: Four times a day (QID) | ORAL | Status: DC | PRN
  Administered 2020-05-18 – 2020-05-20 (×3): 80 mg via ORAL
  Filled 2020-05-18 (×4): qty 1

## 2020-05-18 NOTE — Progress Notes (Signed)
Talked to doctor again about low BP.  She said continue as is unless the patient becomes symptomatic.

## 2020-05-18 NOTE — Progress Notes (Signed)
Patient's BP was 89/55 at 0920 and 89/41 at 0940.  Dr. Retta Diones was notified and ordered HGB and HCT, he had me page Dr. Liliane Shi who called at 1033 and ordered a 500 ml bolus.  Patient has a yellow MEWS sign on the door to do vitals signs q2h.

## 2020-05-18 NOTE — Progress Notes (Signed)
1 Day Post-Op Subjective: Patient reports nausea and pain.  She is sleepy this morning.  Stable overnight.  Objective: Vital signs in last 24 hours: Temp:  [97.6 F (36.4 C)-98.5 F (36.9 C)] 98.1 F (36.7 C) (12/09 0228) Pulse Rate:  [51-102] 58 (12/09 0228) Resp:  [10-22] 18 (12/09 0228) BP: (102-130)/(54-89) 102/54 (12/09 0228) SpO2:  [94 %-100 %] 100 % (12/09 0228)  Intake/Output from previous day: 12/08 0701 - 12/09 0700 In: 2430 [P.O.:480; I.V.:1850; IV Piggyback:100] Out: 1117 [Urine:900; Drains:117; Blood:100] Intake/Output this shift: No intake/output data recorded.  Physical Exam:  Constitutional: Vital signs reviewed. WD WN in NAD   Eyes: PERRL, No scleral icterus.   Cardiovascular: RRR Pulmonary/Chest: Normal effort Abdominal: Dressing with small amount of blood.  JP drainage serosanguineous Extremities: No cyanosis or edema   Lab Results: Recent Labs    05/16/20 1640 05/18/20 0449  HGB 13.2 11.3*  HCT 39.1 33.0*   BMET Recent Labs    05/16/20 1640  NA 138  K 4.2  CL 105  CO2 25  GLUCOSE 99  BUN 15  CREATININE 0.70  CALCIUM 9.2   No results for input(s): LABPT, INR in the last 72 hours. No results for input(s): LABURIN in the last 72 hours. Results for orders placed or performed during the hospital encounter of 05/17/20  Resp Panel by RT-PCR (Flu A&B, Covid) Nasopharyngeal Swab     Status: None   Collection Time: 05/17/20  3:12 PM   Specimen: Nasopharyngeal Swab; Nasopharyngeal(NP) swabs in vial transport medium  Result Value Ref Range Status   SARS Coronavirus 2 by RT PCR NEGATIVE NEGATIVE Final    Comment: (NOTE) SARS-CoV-2 target nucleic acids are NOT DETECTED.  The SARS-CoV-2 RNA is generally detectable in upper respiratory specimens during the acute phase of infection. The lowest concentration of SARS-CoV-2 viral copies this assay can detect is 138 copies/mL. A negative result does not preclude SARS-Cov-2 infection and should not be  used as the sole basis for treatment or other patient management decisions. A negative result may occur with  improper specimen collection/handling, submission of specimen other than nasopharyngeal swab, presence of viral mutation(s) within the areas targeted by this assay, and inadequate number of viral copies(<138 copies/mL). A negative result must be combined with clinical observations, patient history, and epidemiological information. The expected result is Negative.  Fact Sheet for Patients:  BloggerCourse.com  Fact Sheet for Healthcare Providers:  SeriousBroker.it  This test is no t yet approved or cleared by the [place] FDA and  has been authorized for detection and/or diagnosis of SARS-CoV-2 by FDA under an Emergency Use Authorization (EUA). This EUA will remain  in effect (meaning this test can be used) for the duration of the COVID-19 declaration under Section 564(b)(1) of the Act, 21 U.S.C.section 360bbb-3(b)(1), unless the authorization is terminated  or revoked sooner.       Influenza A by PCR NEGATIVE NEGATIVE Final   Influenza B by PCR NEGATIVE NEGATIVE Final    Comment: (NOTE) The Xpert Xpress SARS-CoV-2/FLU/RSV plus assay is intended as an aid in the diagnosis of influenza from Nasopharyngeal swab specimens and should not be used as a sole basis for treatment. Nasal washings and aspirates are unacceptable for Xpert Xpress SARS-CoV-2/FLU/RSV testing.  Fact Sheet for Patients: BloggerCourse.com  Fact Sheet for Healthcare Providers: SeriousBroker.it  This test is not yet approved or cleared by the [place] FDA and has been authorized for detection and/or diagnosis of SARS-CoV-2 by FDA under an Emergency  Use Authorization (EUA). This EUA will remain in effect (meaning this test can be used) for the duration of the COVID-19 declaration under Section  564(b)(1) of the Act, 21 U.S.C. section 360bbb-3(b)(1), unless the authorization is terminated or revoked.  Performed at [facility], [address] [place], [place] [address]     Studies/Results: No results found.  Assessment/Plan:   Postop day #1 cystorrhaphy.  Patient overall stable.    We will need to get out of bed, ambulate.  Advance diet as tolerated   LOS: 1 day   Chelsea Aus 05/18/2020, 7:05 AM

## 2020-05-18 NOTE — Progress Notes (Signed)
Pt BP 84/54, pulse 55. Pt asymptomatic. On call MD paged to see if there were other pain control options besides narcotics to avoid dropping the BP more. MD stated she has been soft all day and to keep her current medications as long as pt is asymptomatic. Will continue q 4 vitals to monitor.

## 2020-05-18 NOTE — Anesthesia Postprocedure Evaluation (Signed)
Anesthesia Post Note  Patient: Kim Pacheco  Procedure(s) Performed: EXPLORATORY LAPAROTOMY WITH CYSTORRHAPHY (N/A Abdomen)     Patient location during evaluation: PACU Anesthesia Type: General Level of consciousness: awake Pain management: pain level controlled Vital Signs Assessment: post-procedure vital signs reviewed and stable Respiratory status: spontaneous breathing, nonlabored ventilation, respiratory function stable and patient connected to nasal cannula oxygen Cardiovascular status: blood pressure returned to baseline and stable Postop Assessment: no apparent nausea or vomiting Anesthetic complications: no   No complications documented.  Last Vitals:  Vitals:   05/17/20 2341 05/18/20 0228  BP: 104/61 (!) 102/54  Pulse: (!) 51 (!) 58  Resp: 16 18  Temp: 36.7 C 36.7 C  SpO2: 100% 100%    Last Pain:  Vitals:   05/18/20 0320  TempSrc:   PainSc: 2                  Ankit Degregorio P Linh Hedberg

## 2020-05-19 LAB — CBC
HCT: 28.7 % — ABNORMAL LOW (ref 36.0–46.0)
Hemoglobin: 9.6 g/dL — ABNORMAL LOW (ref 12.0–15.0)
MCH: 30.9 pg (ref 26.0–34.0)
MCHC: 33.4 g/dL (ref 30.0–36.0)
MCV: 92.3 fL (ref 80.0–100.0)
Platelets: 153 10*3/uL (ref 150–400)
RBC: 3.11 MIL/uL — ABNORMAL LOW (ref 3.87–5.11)
RDW: 12.4 % (ref 11.5–15.5)
WBC: 6.5 10*3/uL (ref 4.0–10.5)
nRBC: 0 % (ref 0.0–0.2)

## 2020-05-19 LAB — BASIC METABOLIC PANEL
Anion gap: 5 (ref 5–15)
BUN: 7 mg/dL (ref 6–20)
CO2: 24 mmol/L (ref 22–32)
Calcium: 7.8 mg/dL — ABNORMAL LOW (ref 8.9–10.3)
Chloride: 109 mmol/L (ref 98–111)
Creatinine, Ser: 0.67 mg/dL (ref 0.44–1.00)
GFR, Estimated: >60 mL/min (ref >60)
Glucose, Bld: 84 mg/dL (ref 70–99)
Potassium: 3.8 mmol/L (ref 3.5–5.1)
Sodium: 138 mmol/L (ref 135–145)

## 2020-05-19 LAB — CREATININE, FLUID (PLEURAL, PERITONEAL, JP DRAINAGE): Creat, Fluid: 0.6 mg/dL

## 2020-05-19 MED ORDER — CHLORHEXIDINE GLUCONATE CLOTH 2 % EX PADS
6.0000 | MEDICATED_PAD | Freq: Every day | CUTANEOUS | Status: DC
  Administered 2020-05-19 – 2020-05-20 (×2): 6 via TOPICAL

## 2020-05-19 MED ORDER — CEPHALEXIN 500 MG PO CAPS: 500.0000 mg | ORAL_CAPSULE | Freq: Two times a day (BID) | ORAL | 0 refills | Status: AC

## 2020-05-19 MED ORDER — OXYBUTYNIN CHLORIDE 5 MG PO TABS: 5.0000 mg | ORAL_TABLET | Freq: Three times a day (TID) | ORAL | 1 refills | Status: DC | PRN

## 2020-05-19 MED ORDER — ENSURE SURGERY PO LIQD
237.0000 mL | Freq: Two times a day (BID) | ORAL | Status: DC
  Administered 2020-05-19 – 2020-05-20 (×2): 237 mL via ORAL

## 2020-05-19 MED ORDER — PHENAZOPYRIDINE HCL 200 MG PO TABS: 200.0000 mg | ORAL_TABLET | Freq: Three times a day (TID) | ORAL | 0 refills | Status: DC | PRN

## 2020-05-19 MED ORDER — HYDROCODONE-ACETAMINOPHEN 5-325 MG PO TABS: 1.0000 | ORAL_TABLET | ORAL | 0 refills | Status: DC | PRN

## 2020-05-19 NOTE — Progress Notes (Signed)
Initial Nutrition Assessment  INTERVENTION:   -Ensure Surgery BID, each provides 330 kcals and 18g protein  NUTRITION DIAGNOSIS:   Increased nutrient needs related to acute illness,post-op healing as evidenced by estimated needs.  GOAL:   Patient will meet greater than or equal to 90% of their needs  MONITOR:   PO intake,Supplement acceptance,Weight trends,Labs,I & O's  REASON FOR ASSESSMENT:   Malnutrition Screening Tool    ASSESSMENT:   34 y.o. year old female with a 6-week history of lower abdominal pain associated with ascites of unknown origin along with recurrent episodes of urinary retention.  She has a prior medical history of hepatitis B associated with ascites and liver dysfunction.  She was seen in early November of 2021 due to worsening abdominal pain and was found to have abdominal ascites associated with acute renal failure.CT cystogram confirms an intraperitoneal bladder perforation.  12/8: s/p ex lap for intraperitoneal bladder perforation  Patient in room, just received meal tray of liquids. Diet has been advanced, encouraged pt to order a regular diet tray. Pt states she will once she finishes her soup and icee. Pt reports having a good appetite but doesn't eat well d/t her procedures and the pain from recovery. Pt agreeable to receiving Ensure supplements, placed order.  Per weight records, pt has lost 11 lbs since 10/29 ( 8% wt loss x 1.5 months, significant for time frame).  Medications reviewed.  Labs reviewed: CBGs: 137  NUTRITION - FOCUSED PHYSICAL EXAM:  No depletions noted.  Diet Order:   Diet Order            Diet regular Room service appropriate? Yes; Fluid consistency: Thin  Diet effective now                 EDUCATION NEEDS:   No education needs have been identified at this time  Skin:  Skin Assessment: Reviewed RN Assessment  Last BM:  12/8  Height:   Ht Readings from Last 1 Encounters:  05/16/20 4\' 11"  (1.499 m)     Weight:   Wt Readings from Last 1 Encounters:  05/16/20 57.2 kg   BMI:  25 kg/m^2  Estimated Nutritional Needs:   Kcal:  1650-1850  Protein:  75-90g  Fluid:  1.8L/day  14/07/21, MS, RD, LDN Inpatient Clinical Dietitian Contact information available via Amion

## 2020-05-19 NOTE — Progress Notes (Signed)
2 Days Post-Op Subjective: No acute events over the past shift. Generalized abdominal pain is improving. She does complain of incisional pain. IV pain medication has been withheld due to postoperative hypotension, which is progressively improving. She denies nausea/vomiting. JP drain output has been scant. JP creatinine today was not consistent with urine. Urine output has been more than adequate.  Objective: Vital signs in last 24 hours: Temp:  [97.5 F (36.4 C)-98.9 F (37.2 C)] 97.5 F (36.4 C) (12/10 1342) Pulse Rate:  [50-70] 70 (12/10 1342) Resp:  [14-18] 14 (12/10 1342) BP: (77-104)/(42-68) 101/60 (12/10 1342) SpO2:  [98 %-100 %] 98 % (12/10 1342)  Intake/Output from previous day: 12/09 0701 - 12/10 0700 In: 2071.6 [P.O.:960; I.V.:1011.6; IV Piggyback:100] Out: 5120 [Urine:5050; Drains:70]  Intake/Output this shift: Total I/O In: 300 [P.O.:300] Out: 1370 [Urine:1350; Drains:20]  Physical Exam:  General: Alert and oriented CV: RRR, palpable distal pulses Lungs: CTAB, equal chest rise Abdomen: Soft, NTND, no rebound or guarding. JP drain with bloody/serosanguineous output Incisions: Pfannenstiel incision is clean, dry and intact Gu: Foley catheter in place with pink-tinged urine Ext: NT, No erythema  Lab Results: Recent Labs    05/18/20 0449 05/18/20 1023 05/19/20 0840  HGB 11.3* 11.0* 9.6*  HCT 33.0* 32.6* 28.7*   BMET Recent Labs    05/16/20 1640 05/19/20 0840  NA 138 138  K 4.2 3.8  CL 105 109  CO2 25 24  GLUCOSE 99 84  BUN 15 7  CREATININE 0.70 0.67  CALCIUM 9.2 7.8*     Studies/Results: No results found.  Assessment/Plan: Postop day 2 status post exploratory laparotomy for intraperitoneal bladder perforation of unknown etiology -DC JP drain tomorrow -Continue regular diet -Out of bed to chair and ambulate -P.o. pain medications as tolerated.  Continue to monitor hypotension.  -Likely home tomorrow with Foley catheter. I will arrange a  cystogram in the office prior to catheter removal.   LOS: 2 days   Rhoderick Moody, MD [facility] Pager: [phone]  05/19/2020, 2:13 PM

## 2020-05-20 ENCOUNTER — Other Ambulatory Visit: Payer: Self-pay

## 2020-05-20 NOTE — Discharge Summary (Signed)
Date of admission: 05/17/2020  Date of discharge: 05/20/2020  Admission diagnosis: Intraperitoneal bladder rupture  Discharge diagnosis: Intraperitoneal bladder rupture  Secondary diagnoses:  Patient Active Problem List   Diagnosis Date Noted  . Extraperitoneal bladder perforation 05/17/2020  . History of recurrent UTI (urinary tract infection) 05/08/2020  . Free intraperitoneal air 05/08/2020  . Anxiety   . Abnormal CT of the abdomen   . Hepatic steatosis 05/07/2020  . Pneumoperitoneum 05/07/2020  . Foley catheter in place 05/07/2020  . Anal fissure 05/07/2020  . Change in bowel habit 05/07/2020  . Periumbilical pain 27/51/7001  . Rectal bleeding 05/07/2020  . History of gestational diabetes 05/07/2020  . History of depression 05/07/2020  . Portal hypertension ([place])   . History of urinary retention 04/15/2020  . Chronic abdominal pain 04/13/2020  . Narcolepsy without cataplexy 02/02/2020  . Chronic gastritis 05/08/2019  . Depression, major, single episode, moderate ([place]) 10/28/2018  . Elevated CA-125 08/06/2018  . ARF (acute renal failure) ([place]) 06/25/2018  . Alcohol abuse 06/24/2018  . Constipation, chronic 06/24/2018  . Ascites 06/24/2018  . Body mass index (BMI) of 25.0 to 29.9 05/29/2018  . Fibrocystic disease of breast 05/29/2018  . Metrorrhagia 05/29/2018  . Urinary tract infectious disease 05/29/2018  . Oral hypoglycemic controlled White classification A2 gestational diabetes mellitus (GDM) 11/18/2017  . Previous cesarean delivery, delivered: arrest of dilation  11/18/2017  . Abnormal glucose tolerance test (GTT) during pregnancy, antepartum 09/17/2017  . Chronic active viral hepatitis B ([place]) 06/14/2014    Procedures performed: Procedure(s): EXPLORATORY LAPAROTOMY WITH CYSTORRHAPHY  History and Physical: For full details, please see admission history and physical. Briefly, Kim Pacheco is a 34 y.o. year old patient with intraperitoneal bladder  rupture.   Hospital Course: Patient tolerated the procedure well.  She was then transferred to the floor after an uneventful PACU stay.  Her hospital course was uncomplicated.  On POD#2 she had met discharge criteria: was eating a regular diet, was up and ambulating independently,  pain was well controlled, JP was removed and was ready to for discharge.   Laboratory values:  Recent Labs    05/18/20 0449 05/18/20 1023 05/19/20 0840  WBC  --   --  6.5  HGB 11.3* 11.0* 9.6*  HCT 33.0* 32.6* 28.7*   Recent Labs    05/19/20 0840  NA 138  K 3.8  CL 109  CO2 24  GLUCOSE 84  BUN 7  CREATININE 0.67  CALCIUM 7.8*   No results for input(s): LABPT, INR in the last 72 hours. No results for input(s): LABURIN in the last 72 hours. Results for orders placed or performed during the hospital encounter of 05/17/20  Resp Panel by RT-PCR (Flu A&B, Covid) Nasopharyngeal Swab     Status: None   Collection Time: 05/17/20  3:12 PM   Specimen: Nasopharyngeal Swab; Nasopharyngeal(NP) swabs in vial transport medium  Result Value Ref Range Status   SARS Coronavirus 2 by RT PCR NEGATIVE NEGATIVE Final    Comment: (NOTE) SARS-CoV-2 target nucleic acids are NOT DETECTED.  The SARS-CoV-2 RNA is generally detectable in upper respiratory specimens during the acute phase of infection. The lowest concentration of SARS-CoV-2 viral copies this assay can detect is 138 copies/mL. A negative result does not preclude SARS-Cov-2 infection and should not be used as the sole basis for treatment or other patient management decisions. A negative result may occur with  improper specimen collection/handling, submission of specimen other than nasopharyngeal swab, presence of viral mutation(s)  within the areas targeted by this assay, and inadequate number of viral copies(<138 copies/mL). A negative result must be combined with clinical observations, patient history, and epidemiological information. The expected result  is Negative.  Fact Sheet for Patients:  EntrepreneurPulse.com.au  Fact Sheet for Healthcare Providers:  IncredibleEmployment.be  This test is no t yet approved or cleared by the [place] FDA and  has been authorized for detection and/or diagnosis of SARS-CoV-2 by FDA under an Emergency Use Authorization (EUA). This EUA will remain  in effect (meaning this test can be used) for the duration of the COVID-19 declaration under Section 564(b)(1) of the Act, 21 U.S.C.section 360bbb-3(b)(1), unless the authorization is terminated  or revoked sooner.       Influenza A by PCR NEGATIVE NEGATIVE Final   Influenza B by PCR NEGATIVE NEGATIVE Final    Comment: (NOTE) The Xpert Xpress SARS-CoV-2/FLU/RSV plus assay is intended as an aid in the diagnosis of influenza from Nasopharyngeal swab specimens and should not be used as a sole basis for treatment. Nasal washings and aspirates are unacceptable for Xpert Xpress SARS-CoV-2/FLU/RSV testing.  Fact Sheet for Patients: EntrepreneurPulse.com.au  Fact Sheet for Healthcare Providers: IncredibleEmployment.be  This test is not yet approved or cleared by the [place] FDA and has been authorized for detection and/or diagnosis of SARS-CoV-2 by FDA under an Emergency Use Authorization (EUA). This EUA will remain in effect (meaning this test can be used) for the duration of the COVID-19 declaration under Section 564(b)(1) of the Act, 21 U.S.C. section 360bbb-3(b)(1), unless the authorization is terminated or revoked.  Performed at [facility], [place] [address] [place], [place] [address]     Disposition: Home  Discharge instruction: The patient was instructed to be ambulatory but told to refrain from heavy lifting, strenuous activity, or driving.   Discharge medications:  Allergies as of 05/20/2020      Reactions   Contrast Media [iodinated  Diagnostic Agents] Anaphylaxis, Shortness Of Breath, Rash   IV contrast   Nsaids Other (See Comments)   Hx gastritis & gastric perforation    Prednisone & Diphenhydramine Other (See Comments)   Don't like to take it      Medication List    STOP taking these medications   docusate sodium 100 MG capsule Commonly known as: Colace   oxyCODONE 5 MG immediate release tablet Commonly known as: Oxy IR/ROXICODONE     TAKE these medications   cephALEXin 500 MG capsule Commonly known as: KEFLEX Take 1 capsule (500 mg total) by mouth 2 (two) times daily for 3 days. Start taking the day prior to your scheduled follow-up appointment with Dr. Lovena Neighbours for catheter removal.   furosemide 20 MG tablet Commonly known as: LASIX Take 1 tablet (20 mg total) by mouth daily.   HYDROcodone-acetaminophen 5-325 MG tablet Commonly known as: Norco Take 1 tablet by mouth every 4 (four) hours as needed for moderate pain.   methocarbamol 500 MG tablet Commonly known as: ROBAXIN Take 1-2 tablets (500-1,000 mg total) by mouth every 8 (eight) hours as needed for muscle spasms. What changed: how much to take   ondansetron 4 MG disintegrating tablet Commonly known as: Zofran ODT Take 1 tablet (4 mg total) by mouth every 8 (eight) hours as needed for nausea or vomiting.   oxybutynin 5 MG tablet Commonly known as: DITROPAN Take 1 tablet (5 mg total) by mouth every 8 (eight) hours as needed for bladder spasms.   phenazopyridine 200 MG tablet Commonly known  as: Pyridium Take 1 tablet (200 mg total) by mouth 3 (three) times daily as needed (for pain with urination).   polyethylene glycol 17 g packet Commonly known as: MIRALAX / GLYCOLAX Take 17 g by mouth 2 (two) times daily.   spironolactone 25 MG tablet Commonly known as: ALDACTONE Take 1 tablet (25 mg total) by mouth daily.       Followup:   Follow-up Information    Ceasar Mons, MD In 2 weeks.   Specialty: Urology Why: My office  will call to schedule your follow-up appointment Contact information: [address] [place] [address] [phone]

## 2020-05-20 NOTE — Discharge Instructions (Signed)
· Activity:  You are encouraged to ambulate frequently (about every hour during waking hours) to help prevent blood clots from forming in your legs or lungs.  However, you should not engage in any heavy lifting (> 10-15 lbs), strenuous activity, or straining. ° °· Diet: You should advance your diet as instructed by your physician.  It will be normal to have some bloating, nausea, and abdominal discomfort intermittently. ° °· Prescriptions:  You will be provided a prescription for pain medication to take as needed.  If your pain is not severe enough to require the prescription pain medication, you may take extra strength Tylenol instead which will have less side effects.  You should also take a prescribed stool softener to avoid straining with bowel movements as the prescription pain medication may constipate you. ° °· Incisions: You may remove your dressing bandages 48 hours after surgery if not removed in the hospital.  You will either have some small staples or special tissue glue at each of the incision sites. Once the bandages are removed (if present), the incisions may stay open to air.  You may start showering (but not soaking or bathing in water) the 2nd day after surgery and the incisions simply need to be patted dry after the shower.  No additional care is needed. ° °· What to call us about: You should call the office ([phone]) if you develop fever > 101 or develop persistent vomiting. Activity:  You are encouraged to ambulate frequently (about every hour during waking hours) to help prevent blood clots from forming in your legs or lungs.  However, you should not engage in any heavy lifting (> 10-15 lbs), strenuous activity, or straining. ° ° °Foley Catheter Care, Adult °A soft, flexible tube (Foley catheter) has been placed in your bladder. This may be done to temporarily help with urine drainage after an operation or to relieve blockage from an enlarged prostate gland. °HOME CARE INSTRUCTIONS  °If  you are going home with a Foley catheter in place, follow these instructions: °Taking Care of the Catheter: °· Keep the area where the catheter leaves your body clean. °· Attach the catheter to the leg so there is no tension on the catheter. °· Keep the drainage bag below the level of the bladder, but keep it OFF the floor. °· Do not take long soaking baths.  °· Wash your hands before touching ANYTHING related to the catheter or bag. °· Using mild soap and warm water on a washcloth: °· Clean the area closest to the catheter insertion site using a circular motion around the catheter. °· Clean the catheter itself by wiping AWAY from the insertion site for several inches down the tube. °· NEVER wipe upward as this could sweep bacteria up into the urethra (tube in your body that normally drains the bladder) and cause infection. °Taking Care of the Drainage Bags: °· Two drainage bags will be taken home: a large overnight drainage bag, and a smaller leg bag which fits underneath clothing. °· It is okay to wear the overnight bag at any time, but NEVER wear the smaller leg bag at night. °· Keep the drainage bag well below the level of your bladder. This prevents backflow of urine into the bladder and allows the urine to drain freely. °· Anchor the tubing to your leg to prevent pulling or tension on the catheter. Use tape or a leg strap provided by the hospital. °· Empty the drainage bag when it is ½ to ¾   full. Wash your hands before and after touching the bag. °· Periodically check the tubing for kinks to make sure there is no pressure on the tubing which could restrict the flow of urine. °Changing the Drainage Bags: °· Cleanse both ends of the clean bag with alcohol before changing. °· Pinch off the rubber catheter to avoid urine spillage during the disconnection. °· Disconnect the dirty bag and connect the clean one. °· Empty the dirty bag carefully to avoid a urine spill. °· Attach the new bag to the leg with tape or a  leg strap. °Cleaning the Drainage Bags: °· Whenever a drainage bag is disconnected, it must be cleaned quickly so it is ready for the next use. °· Wash the bag in warm, soapy water. °· Rinse the bag thoroughly with warm water. °· Soak the bag for 30 minutes in a solution of white vinegar and water (1 cup vinegar to 1 quart warm water). °· Rinse with warm water. °SEEK MEDICAL CARE IF:  °· Some pain develops in the kidney (lower back) area. °· The urine is cloudy or smells bad. °· There is some blood in the urine. °· The catheter becomes clogged and/or there is no urine drainage. °SEEK IMMEDIATE MEDICAL CARE IF:  °· You have moderate or severe pain in the kidney region. °· You start to throw up (vomit). °· Blood fills the tube. °· Worsening belly (abdominal) pain develops. °· You have a fever. °MAKE SURE YOU:  °· Understand these instructions. °· Will watch your condition. °· Will get help right away if you are not doing well or get worse. ° °Call [facility] if you have any questions or concerns: [phone] ° ° ° °

## 2020-05-20 NOTE — Progress Notes (Signed)
Reviewed written d/c instructions w pt and husband and all questions answered. Reviewed basics of cath care ( drainage bag, leg bag, peri care, etc) and pt verb understanding. D/C via w/c with all of her belongings.

## 2020-05-20 NOTE — Progress Notes (Signed)
Urology Inpatient Progress Report  Extraperitoneal bladder perforation [N32.89]  Procedure(s): EXPLORATORY LAPAROTOMY WITH CYSTORRHAPHY  3 Days Post-Op   Intv/Subj: No acute events overnight.  Pain controlled.  Tolerating diet.   Active Problems:   Extraperitoneal bladder perforation  Current Facility-Administered Medications  Medication Dose Route Frequency Provider Last Rate Last Admin  . 0.45 % sodium chloride infusion   Intravenous Continuous Marcine Matar, MD 75 mL/hr at 05/20/20 0541 New Bag at 05/20/20 0541  . acetaminophen (TYLENOL) tablet 650 mg  650 mg Oral Q4H PRN Marcine Matar, MD   650 mg at 05/19/20 1153  . ceFAZolin (ANCEF) IVPB 1 g/50 mL premix  1 g Intravenous Robyn Haber, MD 100 mL/hr at 05/20/20 0031 1 g at 05/20/20 0031  . Chlorhexidine Gluconate Cloth 2 % PADS 6 each  6 each Topical Daily Marcine Matar, MD   6 each at 05/19/20 1146  . feeding supplement (ENSURE SURGERY) liquid 237 mL  237 mL Oral BID BM Marcine Matar, MD   237 mL at 05/19/20 1337  . HYDROmorphone (DILAUDID) injection 0.5-1 mg  0.5-1 mg Intravenous Q2H PRN Marcine Matar, MD   0.5 mg at 05/20/20 0404  . methocarbamol (ROBAXIN) tablet 500 mg  500 mg Oral Q8H PRN Marcine Matar, MD   500 mg at 05/19/20 2249  . ondansetron (ZOFRAN-ODT) disintegrating tablet 4 mg  4 mg Oral Q8H PRN Marcine Matar, MD   4 mg at 05/18/20 0253  . oxybutynin (DITROPAN) tablet 5 mg  5 mg Oral Q8H PRN Marcine Matar, MD   5 mg at 05/18/20 2012  . oxyCODONE (Oxy IR/ROXICODONE) immediate release tablet 5 mg  5 mg Oral Q4H PRN Marcine Matar, MD   5 mg at 05/20/20 0609  . promethazine (PHENERGAN) injection 12.5 mg  12.5 mg Intravenous Q6H PRN Marcine Matar, MD   12.5 mg at 05/18/20 0509  . simethicone (MYLICON) chewable tablet 80 mg  80 mg Oral QID PRN Patience Musca, MD   80 mg at 05/20/20 0155  . spironolactone (ALDACTONE) tablet 25 mg  25 mg Oral Daily Marcine Matar,  MD   25 mg at 05/18/20 0902     Objective: Vital: Vitals:   05/20/20 0231 05/20/20 0405 05/20/20 0436 05/20/20 0606  BP: 113/69 103/67 101/62 (!) 103/59  Pulse: 64 74 64 64  Resp:    14  Temp:    98.3 F (36.8 C)  TempSrc:    Oral  SpO2: 100% 100% 99% 96%   I/Os: I/O last 3 completed shifts: In: 3885 [P.O.:1740; I.V.:1990.8; IV Piggyback:154.2] Out: 7830 [Urine:7725; Drains:105]  Physical Exam:  General: Patient is in no apparent distress Lungs: Normal respiratory effort, chest expands symmetrically. GI: Incisions are c/d/i. The abdomen is soft and nontender without mass. JP drain with serosanguinous drainage (20/35), removed this AM Foley: draining clear yellow urine  Ext: lower extremities symmetric  Lab Results: Recent Labs    05/18/20 0449 05/18/20 1023 05/19/20 0840  WBC  --   --  6.5  HGB 11.3* 11.0* 9.6*  HCT 33.0* 32.6* 28.7*   Recent Labs    05/19/20 0840  NA 138  K 3.8  CL 109  CO2 24  GLUCOSE 84  BUN 7  CREATININE 0.67  CALCIUM 7.8*   No results for input(s): LABPT, INR in the last 72 hours. No results for input(s): LABURIN in the last 72 hours. Results for orders placed or performed during the hospital encounter of 05/17/20  Resp Panel by RT-PCR (Flu A&B,  Covid) Nasopharyngeal Swab     Status: None   Collection Time: 05/17/20  3:12 PM   Specimen: Nasopharyngeal Swab; Nasopharyngeal(NP) swabs in vial transport medium  Result Value Ref Range Status   SARS Coronavirus 2 by RT PCR NEGATIVE NEGATIVE Final    Comment: (NOTE) SARS-CoV-2 target nucleic acids are NOT DETECTED.  The SARS-CoV-2 RNA is generally detectable in upper respiratory specimens during the acute phase of infection. The lowest concentration of SARS-CoV-2 viral copies this assay can detect is 138 copies/mL. A negative result does not preclude SARS-Cov-2 infection and should not be used as the sole basis for treatment or other patient management decisions. A negative result may  occur with  improper specimen collection/handling, submission of specimen other than nasopharyngeal swab, presence of viral mutation(s) within the areas targeted by this assay, and inadequate number of viral copies(<138 copies/mL). A negative result must be combined with clinical observations, patient history, and epidemiological information. The expected result is Negative.  Fact Sheet for Patients:  BloggerCourse.com  Fact Sheet for Healthcare Providers:  SeriousBroker.it  This test is no t yet approved or cleared by the [place] FDA and  has been authorized for detection and/or diagnosis of SARS-CoV-2 by FDA under an Emergency Use Authorization (EUA). This EUA will remain  in effect (meaning this test can be used) for the duration of the COVID-19 declaration under Section 564(b)(1) of the Act, 21 U.S.C.section 360bbb-3(b)(1), unless the authorization is terminated  or revoked sooner.       Influenza A by PCR NEGATIVE NEGATIVE Final   Influenza B by PCR NEGATIVE NEGATIVE Final    Comment: (NOTE) The Xpert Xpress SARS-CoV-2/FLU/RSV plus assay is intended as an aid in the diagnosis of influenza from Nasopharyngeal swab specimens and should not be used as a sole basis for treatment. Nasal washings and aspirates are unacceptable for Xpert Xpress SARS-CoV-2/FLU/RSV testing.  Fact Sheet for Patients: BloggerCourse.com  Fact Sheet for Healthcare Providers: SeriousBroker.it  This test is not yet approved or cleared by the [place] FDA and has been authorized for detection and/or diagnosis of SARS-CoV-2 by FDA under an Emergency Use Authorization (EUA). This EUA will remain in effect (meaning this test can be used) for the duration of the COVID-19 declaration under Section 564(b)(1) of the Act, 21 U.S.C. section 360bbb-3(b)(1), unless the authorization is terminated  or revoked.  Performed at [facility], [address] [place], [place] [address]      Assessment: 34 yo woman POD3 status post exploratory laparotomy for intraperitoneal bladder perforation of unknown etiology -JP removed today -Continue regular diet -Out of bed to chair and ambulate -P.o. pain medications as tolerated.  Continue to monitor hypotension.  -home today with Foley catheter and cystogram prior to removal in the office   Kasandra Knudsen, MD Urology 05/20/2020, 7:05 AM

## 2020-05-22 ENCOUNTER — Ambulatory Visit: Payer: BC Managed Care – PPO | Admitting: Gastroenterology

## 2020-05-22 ENCOUNTER — Encounter: Payer: Self-pay | Admitting: Family Medicine

## 2020-05-22 ENCOUNTER — Encounter: Payer: Self-pay | Admitting: Gastroenterology

## 2020-05-22 VITALS — BP 100/56 | HR 84 | Ht 59.0 in | Wt 121.6 lb

## 2020-05-22 DIAGNOSIS — B181 Chronic viral hepatitis B without delta-agent: Secondary | ICD-10-CM | POA: Diagnosis not present

## 2020-05-22 NOTE — Patient Instructions (Signed)
If you are age 36 or younger, your body mass index should be between 19-25. Your Body mass index is 24.56 kg/m. If this is out of the aformentioned range listed, please consider follow up with your Primary Care Provider.   You will follow up with our office in March 2022.  We will call you to schedule the appointment.  Thank you for entrusting me with your care and choosing [facility].  Dr Christella Hartigan

## 2020-05-22 NOTE — Progress Notes (Signed)
Review of pertinent gastrointestinal problems: 1.  Chronic hepatitis B, alcohol related liver disease: Alcoholic, drinking 1 bottle of wine daily for many years.  Hep BE antigen positive, high hepatitis B DNA titer at 4 billion copies.  Mildly elevated transaminases ALT 41.  Chronic hepatitis B managed by [facility], started tenofovir March 2019  Liver biopsy November 2021.  Mildly active chronic hepatitis (grade 2 out of 4) consistent with history of chronic hepatitis B.  Mild fibrosis (stage 1-2 out of 4) 2.  Urinary bladder rupture, proven December 2021 eventually urologic surgery.  Unclear etiology however her bladder wall was noted to be quite thin and there were some small bowel adhesions up against the bladder wall. 3.  Abdominal pain and pneumoperitoneum November 2021 eventually underwent laparoscopy by general surgery without clear etiology noted.  EGD at the same hospitalization showed mild gastritis, pathology showed no H. pylori, small bowel ampullary biopsies were also normal, benign small bowel. 4.  Colonoscopy July 2020 Dr. Christella Hartigan for abdominal pain showed normal terminal ileum and normal colon.  EGD February 2020 Dr. Christella Hartigan for upper abdominal pain found mild gastritis which was negative for H. pylori.  The examination was otherwise normal.  HPI: This is a very pleasant 34 year old woman who I last saw over a year ago.  She is here with her husband today.  She suffered an intraperitoneal urinary bladder rupture.  This was almost certainly the cause of her abdominal free fluid worsening abdominal pain throughout November 2021.  It was very possibly source of her pneumoperitoneum as well which precipitated an unrevealing laparotomy and unrevealing upper endoscopy.  Findings of May 17, 2020 laparotomy  By urology: The patient had had 2 prior cesarean deliveries and recent laparoscopic surgery.  There was significant perivesical adhesions and finding the peritoneal  cavity was quite difficult.  Once dissected and the bladder opened anteriorly, the bladder lesion was at the left posterior dome area.  This was approximately 1 cm in size.  The bladder wall was quite thin.  There were multiple adhesions between small bowel and the dome of the bladder/uterus.  The remaining bladder urothelium appeared normal outside of the lesion.  There was a significant amount of intra-abdominal fluid, most likely urine.  She is still recovering from her May 08, 2020 laparoscopy with lysis of adhesions for pneumoperitoneum of uncertain etiology and her May 17, 2020 exploratory laparotomy and cystorrhaphy for intraperitoneal bladder rupture.  She has an indwelling Foley in place still.  She is following up with the urologist's in about 10 days in their office.  ROS: complete GI ROS as described in HPI, all other review negative.  Constitutional:  No unintentional weight loss   Past Medical History:  Diagnosis Date  . Abdominal pain    started in mid abd and spread to left side/on and off/since Jan 2020/ pt states, she has fluid in abd/unknown  . Abnormal Pap smear    normal 06/2013  . Anxiety   . Constipation    hx of  . Depression    suicide attempt in college  . Diabetes mellitus without complication (HCC)    gestational, no issues since pregnancy per her report - resolved  . Gestational diabetes    /no meds now - resolved  . Hepatitis    Hep B, managed by Dr. Kinnie Scales  . History of chicken pox   . History of suicide attempt 2003   Attempt in college  . History of UTI   .  Vaginal Pap smear, abnormal     Past Surgical History:  Procedure Laterality Date  . BIOPSY  05/10/2020   Procedure: BIOPSY;  Surgeon: Benancio Deeds, MD;  Location: [facility];  Service: Gastroenterology;;  . BLADDER REPAIR N/A 05/17/2020   Procedure: EXPLORATORY LAPAROTOMY WITH CYSTORRHAPHY;  Surgeon: Marcine Matar, MD;  Location: [facility];  Service: Urology;  Laterality:  N/A;  . CESAREAN SECTION N/A 01/26/2013   Procedure: Primary Cesarean Section Delivery Baby  Boy @ 0017, Apgars 4/8/9;  Surgeon: Freddrick March. Tenny Craw, MD;  Location: [facility];  Service: Obstetrics;  Laterality: N/A;  . CESAREAN SECTION N/A 11/18/2017   Procedure: CESAREAN SECTION;  Surgeon: Olivia Mackie, MD;  Location: [facility];  Service: Obstetrics;  Laterality: N/A;  . COLONOSCOPY  2020  . DIAGNOSTIC LAPAROSCOPIC LIVER BIOPSY N/A 05/07/2020   Procedure: DIAGNOSTIC LAPAROSCOPIC;  Surgeon: Karie Soda, MD;  Location: [facility];  Service: General;  Laterality: N/A;  . DIAGNOSTIC LAPAROSCOPY  08/25/2018   Dr Clifton James with Ob/Gyn3/2020  . ESOPHAGOGASTRODUODENOSCOPY (EGD) WITH PROPOFOL N/A 05/10/2020   Procedure: ESOPHAGOGASTRODUODENOSCOPY (EGD) WITH PROPOFOL;  Surgeon: Benancio Deeds, MD;  Location: [facility];  Service: Gastroenterology;  Laterality: N/A;  . ESOPHAGOGASTRODUODENOSCOPY ENDOSCOPY    . IR TRANSCATHETER BX  04/18/2020  . IR US GUIDE VASC ACCESS RIGHT  04/18/2020  . IR VENOGRAM HEPATIC W HEMODYNAMIC EVALUATION  04/18/2020  . NASAL SINUS SURGERY  1996  . WISDOM TOOTH EXTRACTION      Current Outpatient Medications  Medication Sig Dispense Refill  . furosemide (LASIX) 20 MG tablet Take 1 tablet (20 mg total) by mouth daily. 30 tablet 0  . methocarbamol (ROBAXIN) 500 MG tablet Take 1-2 tablets (500-1,000 mg total) by mouth every 8 (eight) hours as needed for muscle spasms. (Patient taking differently: Take 500 mg by mouth every 8 (eight) hours as needed for muscle spasms.) 25 tablet 0  . oxybutynin (DITROPAN) 5 MG tablet Take 1 tablet (5 mg total) by mouth every 8 (eight) hours as needed for bladder spasms. 30 tablet 1  . phenazopyridine (PYRIDIUM) 200 MG tablet Take 1 tablet (200 mg total) by mouth 3 (three) times daily as needed (for pain with urination). 30 tablet 0  . spironolactone (ALDACTONE) 25 MG tablet Take 1 tablet (25 mg total) by mouth daily. 30 tablet 0  .  cephALEXin (KEFLEX) 500 MG capsule Take 1 capsule (500 mg total) by mouth 2 (two) times daily for 3 days. Start taking the day prior to your scheduled follow-up appointment with Dr. Liliane Shi for catheter removal. (Patient not taking: Reported on 05/22/2020) 6 capsule 0  . HYDROcodone-acetaminophen (NORCO) 5-325 MG tablet Take 1 tablet by mouth every 4 (four) hours as needed for moderate pain. (Patient not taking: Reported on 05/22/2020) 20 tablet 0  . ondansetron (ZOFRAN ODT) 4 MG disintegrating tablet Take 1 tablet (4 mg total) by mouth every 8 (eight) hours as needed for nausea or vomiting. (Patient not taking: Reported on 05/22/2020) 30 tablet 0  . polyethylene glycol (MIRALAX / GLYCOLAX) 17 g packet Take 17 g by mouth 2 (two) times daily. (Patient not taking: Reported on 05/22/2020) 14 each 0   No current facility-administered medications for this visit.    Allergies as of 05/22/2020 - Review Complete 05/22/2020  Allergen Reaction Noted  . Contrast media [iodinated diagnostic agents] Anaphylaxis, Shortness Of Breath, and Rash 06/26/2018  . Nsaids Other (See Comments) 05/07/2020  . Prednisone & diphenhydramine Other (See Comments) 02/28/2020    Family  History  Adopted: Yes  Family history unknown: Yes    Social History   Socioeconomic History  . Marital status: Married    Spouse name: Not on file  . Number of children: 1  . Years of education: college  . Highest education level: Not on file  Occupational History  . Occupation: Environmental manager  Tobacco Use  . Smoking status: Former Smoker    Packs/day: 1.00    Types: Cigarettes    Quit date: 07/23/2011    Years since quitting: 8.8  . Smokeless tobacco: Never Used  . Tobacco comment: smoked for 8 years, quit in 2013  Vaping Use  . Vaping Use: Never used  Substance and Sexual Activity  . Alcohol use: Not Currently    Alcohol/week: 5.0 standard drinks    Types: 5 Glasses of wine per week    Comment: h/o binge drinking - more  abstinent 2021-  . Drug use: Not Currently    Types: Marijuana    Comment: Last use 12/19/18  . Sexual activity: Yes  Other Topics Concern  . Not on file  Social History Narrative   Work or School: dog show - Glass blower/designer Situation: lives with husband, son born in 01/2013      Spiritual Beliefs: none      Lifestyle: no regular exercise; diet is fair - [place] diet      ADOPTED: believes biologic family from [place].  FHx unknown   Social Determinants of Corporate investment banker Strain: Not on file  Food Insecurity: Not on file  Transportation Needs: Not on file  Physical Activity: Not on file  Stress: Not on file  Social Connections: Not on file  Intimate Partner Violence: Not on file    Physical Exam: BP (!) 100/56   Pulse 84   Ht 4\' 11"  (1.499 m)   Wt 121 lb 9.6 oz (55.2 kg)   LMP 04/23/2020 (Approximate)   BMI 24.56 kg/m  Constitutional: generally well-appearing Psychiatric: alert and oriented x3 Abdomen: soft, appropriately tender status post 2 abdominal surgeries recently, nondistended, no obvious ascites, no peritoneal signs, normal bowel sounds No peripheral edema noted in lower extremities  Assessment and plan: 34 y.o. female with chronic hepatitis B, recent surgery for pneumoperitoneum, also recent surgery for urinary bladder rupture  Urinary bladder rupture is a very unusual source of free fluid in the abdomen.  I have never personally seen this type of thing.  I think there is certainly a possibility that the fluid that has waxed and waned in her abdomen and also her intermittent severe abdominal pains were related to urologic issues and may have had nothing to do with her chronic liver disease or alcoholism.  From here, she has a lot of recovering to do from her 2 abdominal surgeries which she underwent in the past 2 weeks.  She still has a Foley catheter in her bladder which hopefully will be able to be removed in the next week or 2 by  urology.  She has had no alcohol at all in 3 or 4 weeks since the severe issues have been going on.  Hopefully this will be a springboard for her to quit alcohol drinking permanently.  She understands and fully admits that she is an alcoholic.  Generally drinks 1 bottle of wine nightly, sometimes more.  This has been a sticking point for her liver care and treating her chronic hepatitis B.  She is due to follow-up with  [facility] in early February.  Hopefully she will be able to maintain abstinence and will be a better candidate for restarting hepatitis B antiviral therapy.  She will return to see me in March and knows to reach out if she has any serious issues with, questions or concerns prior to then.  Please see the "Patient Instructions" section for addition details about the plan.  Rob Buntinganiel Jaksen Fiorella, MD [facility] 05/22/2020, 3:44 PM   Total time on date of encounter was 30 minutes (this included time spent preparing to see the patient reviewing records; obtaining and/or reviewing separately obtained history; performing a medically appropriate exam and/or evaluation; counseling and educating the patient and family if present; ordering medications, tests or procedures if applicable; and documenting clinical information in the health record).

## 2020-05-23 ENCOUNTER — Encounter: Payer: Self-pay | Admitting: Gastroenterology

## 2020-05-23 NOTE — Telephone Encounter (Signed)
Patient is scheduled for a MyChart visit with Dr. Hassan Rowan on 05/29/2020 at 10 AM

## 2020-05-29 ENCOUNTER — Telehealth: Payer: BC Managed Care – PPO | Admitting: Family Medicine

## 2020-06-01 DIAGNOSIS — S3720XD Unspecified injury of bladder, subsequent encounter: Secondary | ICD-10-CM | POA: Diagnosis not present

## 2020-06-29 DIAGNOSIS — S3720XD Unspecified injury of bladder, subsequent encounter: Secondary | ICD-10-CM | POA: Diagnosis not present

## 2020-07-13 DIAGNOSIS — R102 Pelvic and perineal pain: Secondary | ICD-10-CM | POA: Diagnosis not present

## 2020-07-26 DIAGNOSIS — R1012 Left upper quadrant pain: Secondary | ICD-10-CM | POA: Diagnosis not present

## 2020-08-02 DIAGNOSIS — R1032 Left lower quadrant pain: Secondary | ICD-10-CM | POA: Diagnosis not present

## 2020-08-03 DIAGNOSIS — R102 Pelvic and perineal pain: Secondary | ICD-10-CM | POA: Diagnosis not present

## 2020-08-03 DIAGNOSIS — B181 Chronic viral hepatitis B without delta-agent: Secondary | ICD-10-CM | POA: Diagnosis not present

## 2020-08-04 ENCOUNTER — Encounter: Payer: Self-pay | Admitting: Family Medicine

## 2020-08-30 DIAGNOSIS — B181 Chronic viral hepatitis B without delta-agent: Secondary | ICD-10-CM | POA: Diagnosis not present

## 2020-08-30 DIAGNOSIS — Z7289 Other problems related to lifestyle: Secondary | ICD-10-CM | POA: Diagnosis not present

## 2020-09-04 DIAGNOSIS — N3289 Other specified disorders of bladder: Secondary | ICD-10-CM | POA: Diagnosis not present

## 2020-09-04 DIAGNOSIS — Z9889 Other specified postprocedural states: Secondary | ICD-10-CM | POA: Diagnosis not present

## 2020-09-04 DIAGNOSIS — K668 Other specified disorders of peritoneum: Secondary | ICD-10-CM | POA: Diagnosis not present

## 2020-09-04 DIAGNOSIS — L7682 Other postprocedural complications of skin and subcutaneous tissue: Secondary | ICD-10-CM | POA: Diagnosis not present

## 2020-09-05 ENCOUNTER — Ambulatory Visit (INDEPENDENT_AMBULATORY_CARE_PROVIDER_SITE_OTHER): Payer: BC Managed Care – PPO | Admitting: Gastroenterology

## 2020-09-05 ENCOUNTER — Other Ambulatory Visit: Payer: Self-pay

## 2020-09-05 ENCOUNTER — Encounter: Payer: Self-pay | Admitting: Gastroenterology

## 2020-09-05 VITALS — BP 104/60 | HR 60 | Ht 59.0 in | Wt 124.8 lb

## 2020-09-05 DIAGNOSIS — B181 Chronic viral hepatitis B without delta-agent: Secondary | ICD-10-CM | POA: Diagnosis not present

## 2020-09-05 DIAGNOSIS — R1032 Left lower quadrant pain: Secondary | ICD-10-CM | POA: Diagnosis not present

## 2020-09-05 DIAGNOSIS — K739 Chronic hepatitis, unspecified: Secondary | ICD-10-CM | POA: Diagnosis not present

## 2020-09-05 DIAGNOSIS — R1013 Epigastric pain: Secondary | ICD-10-CM | POA: Diagnosis not present

## 2020-09-05 NOTE — Progress Notes (Signed)
Review of pertinent gastrointestinal problems: 1.  Chronic hepatitis B, alcohol related liver disease: Alcoholic, drinking 1 bottle of wine daily for many years.  Hep BE antigen positive, high hepatitis B DNA titer at 4 billion copies.  Mildly elevated transaminases ALT 41.  Chronic hepatitis B managed by [facility], started tenofovir March 2019  Liver biopsy November 2021.  Mildly active chronic hepatitis (grade 2 out of 4) consistent with history of chronic hepatitis B.  Mild fibrosis (stage 1-2 out of 4) 2.  Urinary bladder rupture, proven December 2021 eventually urologic surgery.  Unclear etiology however her bladder wall was noted to be quite thin and there were some small bowel adhesions up against the bladder wall. 3.  Abdominal pain and pneumoperitoneum November 2021 eventually underwent laparoscopy by general surgery without clear etiology noted.  EGD at the same hospitalization showed mild gastritis, pathology showed no H. pylori, small bowel ampullary biopsies were also normal, benign small bowel. 4.  Colonoscopy July 2020 Dr. Christella Hartigan for abdominal pain showed normal terminal ileum and normal colon.  EGD February 2020 Dr. Christella Hartigan for upper abdominal pain found mild gastritis which was negative for H. pylori.  The examination was otherwise normal.   HPI: This is a very pleasant 35 year old woman  I saw her last in December, about 4 months ago.  She was still recovering on her back to back exploratory laparoscopy November 29 and exploratory laparotomy for intraperitoneal bladder rupture December 8.  She still has an indwelling Foley in place.  She had stopped drinking at that point.  Her last drink was 3 or 4 weeks prior.  Her weight is up 3 pounds since her last office visit here.  Same scale.  Today she tells me that she drinks about 2 bottles of wine per week still.  She is bothered by intermittent epigastric pains and chronic left lower quadrant discomforts.  These do  not wax and wane with food intake or moving her bowels.  She tells me she was seen at [facility] just last week and labs were drawn.  She does not know the results of those labs and I cannot find them on epic.  She does tell me that she was not restarted on tenofovir.  She also tells me that her hematologist felt that the free fluid in her abdomen was probably much more likely to be urine than ascites given the way everything played out 3 or 4 months ago.  I told her the same at her office visit several months ago  Yesterday she had several BMs.  They were nonbloody.  Normally she has a BM every 2 to 3 days.  She stopped nearly all of her medicines recently.  She is not on any diuretics or any other prescription medicines   ROS: complete GI ROS as described in HPI, all other review negative.  Constitutional:  No unintentional weight loss   Past Medical History:  Diagnosis Date  . Abdominal pain    started in mid abd and spread to left side/on and off/since Jan 2020/ pt states, she has fluid in abd/unknown  . Abnormal Pap smear    normal 06/2013  . Anxiety   . Constipation    hx of  . Depression    suicide attempt in college  . Diabetes mellitus without complication (HCC)    gestational, no issues since pregnancy per her report - resolved  . Gestational diabetes    /no meds now - resolved  .  Hepatitis    Hep B, managed by Dr. Kinnie Scales  . History of chicken pox   . History of suicide attempt 2003   Attempt in college  . History of UTI   . Vaginal Pap smear, abnormal     Past Surgical History:  Procedure Laterality Date  . BIOPSY  05/10/2020   Procedure: BIOPSY;  Surgeon: Benancio Deeds, MD;  Location: [facility];  Service: Gastroenterology;;  . BLADDER REPAIR N/A 05/17/2020   Procedure: EXPLORATORY LAPAROTOMY WITH CYSTORRHAPHY;  Surgeon: Marcine Matar, MD;  Location: [facility];  Service: Urology;  Laterality: N/A;  . CESAREAN SECTION N/A 01/26/2013    Procedure: Primary Cesarean Section Delivery Baby  Boy @ 0017, Apgars 4/8/9;  Surgeon: Freddrick March. Tenny Craw, MD;  Location: [facility];  Service: Obstetrics;  Laterality: N/A;  . CESAREAN SECTION N/A 11/18/2017   Procedure: CESAREAN SECTION;  Surgeon: Olivia Mackie, MD;  Location: [facility];  Service: Obstetrics;  Laterality: N/A;  . COLONOSCOPY  2020  . DIAGNOSTIC LAPAROSCOPIC LIVER BIOPSY N/A 05/07/2020   Procedure: DIAGNOSTIC LAPAROSCOPIC;  Surgeon: Karie Soda, MD;  Location: [facility];  Service: General;  Laterality: N/A;  . DIAGNOSTIC LAPAROSCOPY  08/25/2018   Dr Clifton James with Ob/Gyn3/2020  . ESOPHAGOGASTRODUODENOSCOPY (EGD) WITH PROPOFOL N/A 05/10/2020   Procedure: ESOPHAGOGASTRODUODENOSCOPY (EGD) WITH PROPOFOL;  Surgeon: Benancio Deeds, MD;  Location: [facility];  Service: Gastroenterology;  Laterality: N/A;  . ESOPHAGOGASTRODUODENOSCOPY ENDOSCOPY    . IR TRANSCATHETER BX  04/18/2020  . IR US GUIDE VASC ACCESS RIGHT  04/18/2020  . IR VENOGRAM HEPATIC W HEMODYNAMIC EVALUATION  04/18/2020  . NASAL SINUS SURGERY  1996  . WISDOM TOOTH EXTRACTION      No current outpatient medications on file.   No current facility-administered medications for this visit.    Allergies as of 09/05/2020 - Review Complete 09/05/2020  Allergen Reaction Noted  . Contrast media [iodinated diagnostic agents] Anaphylaxis, Shortness Of Breath, and Rash 06/26/2018  . Nsaids Other (See Comments) 05/07/2020  . Prednisone & diphenhydramine Other (See Comments) 02/28/2020    Family History  Adopted: Yes  Family history unknown: Yes    Social History   Socioeconomic History  . Marital status: Married    Spouse name: Not on file  . Number of children: 1  . Years of education: college  . Highest education level: Not on file  Occupational History  . Occupation: Environmental manager  Tobacco Use  . Smoking status: Former Smoker    Packs/day: 1.00    Types: Cigarettes    Quit date: 07/23/2011    Years since  quitting: 9.1  . Smokeless tobacco: Never Used  . Tobacco comment: smoked for 8 years, quit in 2013  Vaping Use  . Vaping Use: Never used  Substance and Sexual Activity  . Alcohol use: Not Currently    Alcohol/week: 5.0 standard drinks    Types: 5 Glasses of wine per week    Comment: h/o binge drinking - more abstinent 2021-  . Drug use: Not Currently    Types: Marijuana    Comment: Last use 12/19/18  . Sexual activity: Yes  Other Topics Concern  . 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  FHx unknown   Social Determinants of Corporate investment banker Strain: Not on file  Food Insecurity: Not on file  Transportation Needs: Not on file  Physical Activity: Not on file  Stress: Not on file  Social Connections: Not on file  Intimate Partner Violence: Not on file     Physical Exam: Ht 4\' 11"  (1.499 m)   Wt 124 lb 12.8 oz (56.6 kg)   BMI 25.21 kg/m  Constitutional: generally well-appearing Psychiatric: alert and oriented x3 Abdomen: soft, mildly tender epigastrium and left lower quadrant, nondistended, no obvious ascites, no peritoneal signs, normal bowel sounds No peripheral edema noted in lower extremities  Assessment and plan: 35 y.o. female with chronic hepatitis B without cirrhosis, left lower quadrant and epigastric pain  I suspect some of her epigastric and left lower quadrant pains are still her recovering from her nearly back to back abdominal surgeries late November early December 2021.  She does not have lower extremity edema.  She was [facility] seen [facility].  She tells me that they drew a battery of blood tests.  I cannot see those on epic currently.  [facility] will [facility] to get those  results and set of redrawing her labs today.  I am getting an ultrasound of her abdomen for her epigastric and left lower quadrant pains.  She will return to see me in 4 months and sooner if needed.  I again told her that she should completely stop drinking alcohol.  She drinks about 2 bottles of wine per week.  Please see the "Patient Instructions" section for addition details about the plan.  January 2022, MD [facility] 09/05/2020, 8:26 AM   Total time on date of encounter was 30 minutes (this included time spent preparing to see the patient reviewing records; obtaining and/or reviewing separately obtained history; performing a medically appropriate exam and/or evaluation; counseling and educating the patient and family if present; ordering medications, tests or procedures if applicable; and documenting clinical information in the health record).

## 2020-09-05 NOTE — Patient Instructions (Addendum)
If you are age 37 or older, your body mass index should be between 23-30. Your Body mass index is 25.21 kg/m. If this is out of the aforementioned range listed, please consider follow up with your Primary Care Provider.  If you are age 70 or younger, your body mass index should be between 19-25. Your Body mass index is 25.21 kg/m. If this is out of the aformentioned range listed, please consider follow up with your Primary Care Provider.   You will be contacted by [facility] Scheduling in the next 2 days to arrange an Abdominal Ultra Sound.  The number on your caller ID will be [phone], please answer when they call.  If you have not heard from them in 2 days please call [phone] to schedule.    Call the office next month to see if our July schedule is open to schedule your 4 month follow up.  Thank you for entrusting me with your care and choosing [facility].  Dr Christella Hartigan

## 2020-09-11 ENCOUNTER — Ambulatory Visit (HOSPITAL_COMMUNITY)
Admission: RE | Admit: 2020-09-11 | Discharge: 2020-09-11 | Disposition: A | Discharge status: Home / Self Care | Payer: BC Managed Care – PPO | Source: Ambulatory Visit | Attending: Gastroenterology | Admitting: Gastroenterology

## 2020-09-11 ENCOUNTER — Other Ambulatory Visit: Payer: Self-pay

## 2020-09-11 DIAGNOSIS — K739 Chronic hepatitis, unspecified: Secondary | ICD-10-CM | POA: Diagnosis not present

## 2020-09-11 DIAGNOSIS — B181 Chronic viral hepatitis B without delta-agent: Secondary | ICD-10-CM | POA: Diagnosis not present

## 2020-09-11 DIAGNOSIS — R1013 Epigastric pain: Secondary | ICD-10-CM | POA: Insufficient documentation

## 2020-09-11 DIAGNOSIS — R1032 Left lower quadrant pain: Secondary | ICD-10-CM | POA: Diagnosis not present

## 2020-10-09 DIAGNOSIS — R059 Cough, unspecified: Secondary | ICD-10-CM | POA: Diagnosis not present

## 2020-10-09 DIAGNOSIS — R0981 Nasal congestion: Secondary | ICD-10-CM | POA: Diagnosis not present

## 2020-10-09 DIAGNOSIS — R5383 Other fatigue: Secondary | ICD-10-CM | POA: Diagnosis not present

## 2020-10-10 ENCOUNTER — Encounter: Payer: Self-pay | Admitting: Family Medicine

## 2020-10-10 ENCOUNTER — Telehealth (INDEPENDENT_AMBULATORY_CARE_PROVIDER_SITE_OTHER): Payer: BC Managed Care – PPO | Admitting: Family Medicine

## 2020-10-10 ENCOUNTER — Other Ambulatory Visit (HOSPITAL_COMMUNITY): Payer: Self-pay

## 2020-10-10 ENCOUNTER — Telehealth: Payer: Self-pay | Admitting: *Deleted

## 2020-10-10 DIAGNOSIS — U071 COVID-19: Secondary | ICD-10-CM

## 2020-10-10 MED ORDER — BENZONATATE 100 MG PO CAPS
100.0000 mg | ORAL_CAPSULE | Freq: Three times a day (TID) | ORAL | 0 refills | Status: DC | PRN
  Filled 2020-10-10: qty 20, 7d supply, fill #0

## 2020-10-10 MED ORDER — MOLNUPIRAVIR EUA 200MG CAPSULE
4.0000 | ORAL_CAPSULE | Freq: Two times a day (BID) | ORAL | 0 refills | Status: AC
  Filled 2020-10-10: qty 40, 5d supply, fill #0

## 2020-10-10 NOTE — Patient Instructions (Signed)
HOME CARE TIPS:   -I sent the medication(s) we discussed to your pharmacy: Meds ordered this encounter  Medications  . molnupiravir EUA 200 mg CAPS    Sig: Take 4 capsules (800 mg total) by mouth 2 (two) times daily for 5 days.    Dispense:  40 capsule    Refill:  0  . benzonatate (TESSALON PERLES) 100 MG capsule    Sig: Take 1 capsule (100 mg total) by mouth 3 (three) times daily as needed.    Dispense:  20 capsule    Refill:  0     -I sent in the [place] treatment or referral you requested per our discussion. Please see the information provided below and discuss further with the pharmacist/treatment team.   .LAGEVRIO (Molnupiravir) may cause harm to your unborn baby. It is not known if Molnupiravier(LAGEVRIO) will harm your baby if you take LAGEVRIO during pregnancy. o LAGEVRIO is not recommended for use in pregnancy. o LAGEVRIO has not been studied in pregnancy. LAGEVRIO was studied in pregnant animals only. When LAGEVRIO was given to pregnant animals, LAGEVRIO caused harm to their unborn babies. For individuals who are able to become pregnant: . You should use a reliable method of birth control (contraception) consistently and correctly during treatment with LAGEVRIO and for 4 days after the last dose of LAGEVRIO. Talk to your healthcare provider about reliable birth control methods.  -can use tylenol or aleve if needed for fevers, aches and pains per instructions  -can use nasal saline a few times per day if you have nasal congestion; sometimes  a short course of Afrin nasal spray for 3 days can help with symptoms as well  -stay hydrated, drink plenty of fluids and eat small healthy meals - avoid dairy  -can take 1000 IU (65mg) Vit D3 and 100-500 mg of Vit C daily per instructions  -If the Covid test is positive, check out the [facility] for more information on home care, transmission and treatment for COVID19  -follow up with your doctor in 2-3 days unless improving  and feeling better  -stay home while sick, except to seek medical care. If you have COVID19, ideally it would be best to stay home for a full 10 days since the onset of symptoms PLUS one day of no fever and feeling better. Wear a good mask that fits snugly (such as N95 or KN95) if around others to reduce the risk of transmission.  It was nice to meet you today, and I really hope you are feeling better soon. I help [place] out with telemedicine visits on Tuesdays and Thursdays and am available for visits on those days. If you have any concerns or questions following this visit please schedule a follow up visit with your Primary Care doctor or seek care at a local urgent care clinic to avoid delays in care.     Fact Sheet for Patients And Caregivers Emergency Use Authorization (EUA) Of LAGEVRIOT (molnupiravir) capsules For Coronavirus Disease 2019 (COVID-19)  What is the most important information I should know about LAGEVRIO? LAGEVRIO may cause serious side effects, including: . LAGEVRIO may cause harm to your unborn baby. It is not known if LAGEVRIO will harm your baby if you take LAGEVRIO during pregnancy. o LAGEVRIO is not recommended for use in pregnancy. o LAGEVRIO has not been studied in pregnancy. LAGEVRIO was studied in pregnant animals only. When LAGEVRIO was given to pregnant animals, LAGEVRIO caused harm to their unborn babies. o You and your healthcare  provider may decide that you should take LAGEVRIO during pregnancy if there are no other COVID-19 treatment options approved or authorized by the FDA that are accessible or clinically appropriate for you. o If you and your healthcare provider decide that you should take LAGEVRIO during pregnancy, you and your healthcare provider should discuss the known and potential benefits and the potential risks of taking LAGEVRIO during pregnancy. For individuals who are able to become pregnant: . You should use a reliable method of birth  control (contraception) consistently and correctly during treatment with LAGEVRIO and for 4 days after the last dose of LAGEVRIO. Talk to your healthcare provider about reliable birth control methods. . Before starting treatment with [facility] your healthcare provider may do a pregnancy test to see if you are pregnant before starting treatment with LAGEVRIO. . Tell your healthcare provider right away if you become pregnant or think you may be pregnant during treatment with LAGEVRIO. Pregnancy Surveillance Program: . There is a pregnancy surveillance program for individuals who take LAGEVRIO during pregnancy. The purpose of this program is to collect information about the health of you and your baby. Talk to your healthcare provider about how to take part in this program. . If you take LAGEVRIO during pregnancy and you agree to participate in the pregnancy surveillance program and allow your healthcare provider to share your information with [place], then your healthcare provider will report your use of [place] during pregnancy to [place]. by calling [phone] or [email or web address]. For individuals who are sexually active with partners who are able to become pregnant: . It is not known if LAGEVRIO can affect sperm. While the risk is regarded as low, animal studies to fully assess the potential for LAGEVRIO to affect the babies of males treated with LAGEVRIO have not been completed. A reliable method of birth control (contraception) should be used consistently and correctly during treatment with LAGEVRIO and for at least 3 months after the last dose. The risk to sperm beyond 3 months is not known. Studies to understand the risk to sperm beyond 3 months are ongoing. Talk to your healthcare provider about reliable birth control methods. Talk to your healthcare provider if you have questions or concerns about how LAGEVRIO may affect sperm. You are being  given this fact sheet because your healthcare provider believes it is necessary to provide you with LAGEVRIO for the treatment of adults with mild-to-moderate coronavirus disease 2019 (COVID-19) with positive results of direct SARS-CoV-2 viral testing, and who are at high risk for progression to severe COVID-19 including hospitalization or death, and for whom other COVID-19 treatment options approved or authorized by the FDA are not accessible or clinically appropriate. The U.S. Food and Drug Administration (FDA) has issued an Emergency Use Authorization (EUA) to make LAGEVRIO available during the COVID-19 pandemic (for more details about an EUA please see "What is an Emergency Use Authorization?" at the end of this document). LAGEVRIO is not an FDA-approved medicine in the [place]. Read this Fact Sheet for information about LAGEVRIO. Talk to your healthcare provider about your options if you have any questions. It is your choice to take LAGEVRIO.  What is COVID-19? COVID-19 is caused by a virus called a coronavirus. You can get COVID-19 through close contact with another person who has the virus. COVID-19 illnesses have ranged from very mild-to-severe, including illness resulting in death. While information so far suggests that most COVID-19 illness is mild, serious illness can happen and  may cause some of your other medical conditions to become worse. Older people and people of all ages with severe, long lasting (chronic) medical conditions like heart disease, lung disease and diabetes, for example seem to be at higher risk of being hospitalized for COVID-19.  What is LAGEVRIO? LAGEVRIO is an investigational medicine used to treat mild-to-moderate COVID-19 in adults: . with positive results of direct SARS-CoV-2 viral testing, and . who are at high risk for progression to severe COVID-19 including hospitalization or death, and for whom other COVID-19 treatment options approved or  authorized by the FDA are not accessible or clinically appropriate. The FDA has authorized the emergency use of LAGEVRIO for the treatment of mild-tomoderate COVID-19 in adults under an EUA. For more information on EUA, see the "What is an Emergency Use Authorization (EUA)?" section at the end of this Fact Sheet. LAGEVRIO is not authorized: . for use in people less than 32 years of age. . for prevention of COVID-19. . for people needing hospitalization for COVID-19. . for use for longer than 5 consecutive days.  What should I tell my healthcare provider before I take LAGEVRIO? Tell your healthcare provider if you: . Have any allergies . Are breastfeeding or plan to breastfeed . Have any serious illnesses . Are taking any medicines (prescription, over-the-counter, vitamins, or herbal products).  How do I take LAGEVRIO? Marland Kitchen Take LAGEVRIO exactly as your healthcare provider tells you to take it. . Take 4 capsules of LAGEVRIO every 12 hours (for example, at 8 am and at 8 pm) . Take LAGEVRIO for 5 days. It is important that you complete the full 5 days of treatment with LAGEVRIO. Do not stop taking LAGEVRIO before you complete the full 5 days of treatment, even if you feel better. . Take LAGEVRIO with or without food. . You should stay in isolation for as long as your healthcare provider tells you to. Talk to your healthcare provider if you are not sure about how to properly isolate while you have COVID-19. Marland Kitchen Swallow LAGEVRIO capsules whole. Do not open, break, or crush the capsules. If you cannot swallow capsules whole, tell your healthcare provider. . What to do if you miss a dose: o If it has been less than 10 hours since the missed dose, take it as soon as you remember o If it has been more than 10 hours since the missed dose, skip the missed dose and take your dose at the next scheduled time. . Do not double the dose of LAGEVRIO to make up for a missed dose.  What are the important  possible side effects of LAGEVRIO? . See, "What is the most important information I should know about LAGEVRIO?" . Allergic Reactions. Allergic reactions can happen in people taking LAGEVRIO, even after only 1 dose. Stop taking LAGEVRIO and call your healthcare provider right away if you get any of the following symptoms of an allergic reaction: o hives o rapid heartbeat o trouble swallowing or breathing o swelling of the mouth, lips, or face o throat tightness o hoarseness o skin rash The most common side effects of LAGEVRIO are: . diarrhea . nausea . dizziness These are not all the possible side effects of LAGEVRIO. Not many people have taken LAGEVRIO. Serious and unexpected side effects may happen. This medicine is still being studied, so it is possible that all of the risks are not known at this time.  What other treatment choices are there?  Veklury (remdesivir) is FDA-approved as an  intravenous (IV) infusion for the treatment of mildto-moderate KJZPH-15 in certain adults and children. Talk with your doctor to see if Marijean Heath is appropriate for you. Like LAGEVRIO, FDA may also allow for the emergency use of other medicines to treat people with COVID-19. Go to LacrosseProperties.si for more information. It is your choice to be treated or not to be treated with LAGEVRIO. Should you decide not to take it, it will not change your standard medical care.  What if I am breastfeeding? Breastfeeding is not recommended during treatment with LAGEVRIO and for 4 days after the last dose of LAGEVRIO. If you are breastfeeding or plan to breastfeed, talk to your healthcare provider about your options and specific situation before taking LAGEVRIO.  How do I report side effects with LAGEVRIO? Contact your healthcare provider if you have any side effects that bother you or do not go away. Report  side effects to FDA MedWatch at SmoothHits.hu or call 1-800-FDA-1088 (1- 5412760864).  How should I store [place]? Marland Kitchen Store LAGEVRIO capsules at room temperature between 54F to 105F (20C to 25C). Marland Kitchen Keep LAGEVRIO and all medicines out of the reach of children and pets. How can I learn more about COVID-19? Marland Kitchen Ask your healthcare provider. . Visit SeekRooms.co.uk . Contact your local or state public health department. . Call [place] at [phone] (toll free in the [place] . Visit [email or web address].com  What Is an Emergency Use Authorization (EUA)? The [place] FDA has made [place] available under an emergency access mechanism called an Emergency Use Authorization (EUA) The EUA is supported by a [place], broadcasting Health and Human Service (HHS) declaration that circumstances exist to justify emergency use of drugs and biological products during the COVID-19 pandemic. LAGEVRIO for the treatment of mild-to-moderate COVID-19 in adults with positive results of direct SARS-CoV-2 viral testing, who are at high risk for progression to severe COVID-19, including hospitalization or death, and for whom alternative COVID-19 treatment options approved or authorized by FDA are not accessible or clinically appropriate, has not undergone the same type of review as an FDA-approved product. In issuing an EUA under the PVVZS-82 public health emergency, the FDA has determined, among other things, that based on the total amount of scientific evidence available including data from adequate and well-controlled clinical trials, if available, it is reasonable to believe that the product may be effective for diagnosing, treating, or preventing COVID-19, or a serious or life-threatening disease or condition caused by COVID-19; that the known and potential benefits of the product, when used to diagnose, treat, or prevent such disease or condition, outweigh the known and potential risks of  such product; and that there are no adequate, approved, and available alternatives.  All of these criteria must be met to allow for the product to be used in the treatment of patients during the COVID-19 pandemic. The EUA for LAGEVRIO is in effect for the duration of the COVID-19 declaration justifying emergency use of LAGEVRIO, unless terminated or revoked (after which LAGEVRIO may no longer be used under the EUA). For patent information: http://rogers.info/ Copyright  2021-2022 [place] and its affiliates. All rights reserved. [id number] Revised: March 2022  Seek in person care or schedule a follow up video visit promptly if your symptoms worsen, new concerns arise or you are not improving with treatment. Call 911 and/or seek emergency care if your symptoms are severe or life threatening.

## 2020-10-10 NOTE — Progress Notes (Signed)
Virtual Visit via Video Note  I connected with Kim Pacheco  on 10/10/20 at  1:20 PM EDT by a video enabled telemedicine application and verified that I am speaking with the correct person using two identifiers.  Location patient: home, [place] Location provider:work or home office Persons participating in the virtual visit: patient, provider  I discussed the limitations of evaluation and management by telemedicine and the availability of in person appointments. The patient expressed understanding and agreed to proceed.   HPI:  Acute telemedicine visit for COVID19: -Onset: 3 days ago, tested positive for covid yesterday -Symptoms include: scratchy throat, cough, sinus congestion -Denies: CP, SOB, NVD, inability to eat/drink/get out of bed -was assessed at urgent care yesterday with normal O2sats -Pertinent past medical history: chronic hepatitis B, hx ? Portal hypertension -LFT and renal function normal last labs but those were > 3 months ago -she does not wish to go anywhere in person as does not feel she is that serious and has both kids at home to watch-she is requesting a pill to take for COVID19 -denies any chance of pregnancy and reports husband with vasectomy and agrees to condom use if any interactions while on treatment and after per EUA for molnupiravir -Pertinent medication allergies: nsaids, prednisone, diphenhydramine, iodinated contrast dye -COVID-19 vaccine status: vaccinated and had booster - pfizer -taking tenofovir  -alcohol use glass of wine per night   ROS: See pertinent positives and negatives per HPI.  Past Medical History:  Diagnosis Date  . Abdominal pain    started in mid abd and spread to left side/on and off/since Jan 2020/ pt states, she has fluid in abd/unknown  . Abnormal Pap smear    normal 06/2013  . Anxiety   . Constipation    hx of  . Depression    suicide attempt in college  . Diabetes mellitus without complication (HCC)    gestational, no issues since  pregnancy per her report - resolved  . Gestational diabetes    /no meds now - resolved  . Hepatitis    Hep B, managed by Dr. Kinnie Scales  . History of chicken pox   . History of suicide attempt 2003   Attempt in college  . History of UTI   . Vaginal Pap smear, abnormal     Past Surgical History:  Procedure Laterality Date  . BIOPSY  05/10/2020   Procedure: BIOPSY;  Surgeon: Benancio Deeds, MD;  Location: [facility];  Service: Gastroenterology;;  . BLADDER REPAIR N/A 05/17/2020   Procedure: EXPLORATORY LAPAROTOMY WITH CYSTORRHAPHY;  Surgeon: Marcine Matar, MD;  Location: [facility]S;  Service: Urology;  Laterality: N/A;  . CESAREAN SECTION N/A 01/26/2013   Procedure: Primary Cesarean Section Delivery Baby  Boy @ 0017, Apgars 4/8/9;  Surgeon: Freddrick March. Tenny Craw, MD;  Location: [facility];  Service: Obstetrics;  Laterality: N/A;  . CESAREAN SECTION N/A 11/18/2017   Procedure: CESAREAN SECTION;  Surgeon: Olivia Mackie, MD;  Location: [facility];  Service: Obstetrics;  Laterality: N/A;  . COLONOSCOPY  2020  . DIAGNOSTIC LAPAROSCOPIC LIVER BIOPSY N/A 05/07/2020   Procedure: DIAGNOSTIC LAPAROSCOPIC;  Surgeon: Karie Soda, MD;  Location: [facility];  Service: General;  Laterality: N/A;  . DIAGNOSTIC LAPAROSCOPY  08/25/2018   Dr Clifton James with Ob/Gyn3/2020  . ESOPHAGOGASTRODUODENOSCOPY (EGD) WITH PROPOFOL N/A 05/10/2020   Procedure: ESOPHAGOGASTRODUODENOSCOPY (EGD) WITH PROPOFOL;  Surgeon: Benancio Deeds, MD;  Location: [facility];  Service: Gastroenterology;  Laterality: N/A;  . ESOPHAGOGASTRODUODENOSCOPY ENDOSCOPY    . IR TRANSCATHETER  BX  04/18/2020  . IR US GUIDE VASC ACCESS RIGHT  04/18/2020  . IR VENOGRAM HEPATIC W HEMODYNAMIC EVALUATION  04/18/2020  . NASAL SINUS SURGERY  1996  . WISDOM TOOTH EXTRACTION      No current outpatient medications on file.  EXAM:  VITALS per patient if applicable:  GENERAL: alert, oriented, appears well and in no acute distress  HEENT:  atraumatic, conjunttiva clear, no obvious abnormalities on inspection of external nose and ears  NECK: normal movements of the head and neck  LUNGS: on inspection no signs of respiratory distress, breathing rate appears normal, no obvious gross SOB, gasping or wheezing  CV: no obvious cyanosis  MS: moves all visible extremities without noticeable abnormality  PSYCH/NEURO: pleasant and cooperative, no obvious depression or anxiety, speech and thought processing grossly intact  ASSESSMENT AND PLAN:  Discussed the following assessment and plan:  No diagnosis found.  -we discussed possible serious and likely etiologies, options for evaluation and workup, limitations of telemedicine visit vs in person visit, treatment, treatment risks and precautions. Pt prefers to treat via telemedicine empirically rather than in person at this moment.  Discussed treatment options, ideal treatment window, potential complications, isolation and precautions for COVID-19.  The patient opted for treatment with Molnupiravir due to being higher risk for complications of covid or severe disease, no recent labs per chart and pt and prefers not to go anywhere in persons for treatment or evaluation as is watching her children. Tried to contact liver specialist for input, but was unable to reach. Did discuss with Infectious disease on call, Dr. Drue Second, to ensure ok to use with her liver disease and tenofovir before prescribing. Discussed EUA status limited knowledge of risks/interactions/side effects per EUA document vs possible benefits and precautions shared with patient and provided in patient instructions. Also, advised that patient discuss risks/interactions and use with pharmacist/treatment team as well. Tessalon Rx sent.  Other symptomatic care measures summarized in patient instructions. Work/School slipped offered: declined Scheduled follow up with PCP offered:agrees to follow up as needed with PCP. Advised to seek  prompt in person care if worsening, new symptoms arise, or if is not improving with treatment. Discussed options for inperson care if PCP office not available. Did let this patient know that I only do telemedicine on Tuesdays and Thursdays for [place]. Advised to schedule follow up visit with PCP or U[facility] if any further questions or concerns to avoid delays in care.   I discussed the assessment and treatment plan with the patient. The patient was provided an opportunity to ask questions and all were answered. The patient agreed with the plan and demonstrated an understanding of the instructions.     [name], DO

## 2020-10-10 NOTE — Telephone Encounter (Signed)
Per Dr Selena Batten, I called the pt and informed her she still has not heard back from the liver doctor or the ID specialist regarding which treatment would be best for her. Advised the pt, Dr Selena Batten stated if she does not hear back by 4pm or so, she will send a referral for outpatient treatment to the [facility] outpatient treatment center to see if they can help choose the best option for her.  Medication list updated also.

## 2020-10-15 ENCOUNTER — Encounter: Payer: Self-pay | Admitting: Family Medicine

## 2020-10-18 ENCOUNTER — Encounter: Payer: Self-pay | Admitting: Family Medicine

## 2020-11-30 DIAGNOSIS — K766 Portal hypertension: Secondary | ICD-10-CM | POA: Diagnosis not present

## 2020-11-30 DIAGNOSIS — K7400 Hepatic fibrosis, unspecified: Secondary | ICD-10-CM | POA: Insufficient documentation

## 2020-11-30 DIAGNOSIS — F1010 Alcohol abuse, uncomplicated: Secondary | ICD-10-CM | POA: Diagnosis not present

## 2020-11-30 DIAGNOSIS — B181 Chronic viral hepatitis B without delta-agent: Secondary | ICD-10-CM | POA: Diagnosis not present

## 2020-11-30 HISTORY — DX: Hepatic fibrosis, unspecified: K74.00

## 2020-12-03 ENCOUNTER — Encounter: Payer: Self-pay | Admitting: Family Medicine

## 2020-12-07 DIAGNOSIS — K766 Portal hypertension: Secondary | ICD-10-CM | POA: Insufficient documentation

## 2020-12-07 HISTORY — DX: Portal hypertension: K76.6

## 2020-12-15 ENCOUNTER — Other Ambulatory Visit (INDEPENDENT_AMBULATORY_CARE_PROVIDER_SITE_OTHER): Payer: BC Managed Care – PPO

## 2020-12-15 ENCOUNTER — Ambulatory Visit (INDEPENDENT_AMBULATORY_CARE_PROVIDER_SITE_OTHER): Payer: BC Managed Care – PPO | Admitting: Gastroenterology

## 2020-12-15 ENCOUNTER — Other Ambulatory Visit: Payer: Self-pay

## 2020-12-15 ENCOUNTER — Encounter: Payer: Self-pay | Admitting: Gastroenterology

## 2020-12-15 VITALS — BP 98/64 | HR 68 | Ht 59.0 in | Wt 124.0 lb

## 2020-12-15 DIAGNOSIS — R300 Dysuria: Secondary | ICD-10-CM

## 2020-12-15 DIAGNOSIS — R1030 Lower abdominal pain, unspecified: Secondary | ICD-10-CM

## 2020-12-15 LAB — COMPREHENSIVE METABOLIC PANEL
ALT: 33 U/L (ref 0–35)
AST: 26 U/L (ref 0–37)
Albumin: 4.3 g/dL (ref 3.5–5.2)
Alkaline Phosphatase: 38 U/L — ABNORMAL LOW (ref 39–117)
BUN: 21 mg/dL (ref 6–23)
CO2: 25 mEq/L (ref 19–32)
Calcium: 9 mg/dL (ref 8.4–10.5)
Chloride: 105 mEq/L (ref 96–112)
Creatinine, Ser: 0.67 mg/dL (ref 0.40–1.20)
GFR: 112.03 mL/min (ref >60.00)
Glucose, Bld: 86 mg/dL (ref 70–99)
Potassium: 4.6 mEq/L (ref 3.5–5.1)
Sodium: 137 mEq/L (ref 135–145)
Total Bilirubin: 0.6 mg/dL (ref 0.2–1.2)
Total Protein: 7.8 g/dL (ref 6.0–8.3)

## 2020-12-15 LAB — CBC
HCT: 38.8 % (ref 36.0–46.0)
Hemoglobin: 13.4 g/dL (ref 12.0–15.0)
MCHC: 34.5 g/dL (ref 30.0–36.0)
MCV: 89.7 fl (ref 78.0–100.0)
Platelets: 206 10*3/uL (ref 150.0–400.0)
RBC: 4.33 Mil/uL (ref 3.87–5.11)
RDW: 13.4 % (ref 11.5–15.5)
WBC: 5.4 10*3/uL (ref 4.0–10.5)

## 2020-12-15 NOTE — Patient Instructions (Signed)
If you are age 37 or younger, your body mass index should be between 19-25. Your Body mass index is 25.04 kg/m. If this is out of the aformentioned range listed, please consider follow up with your Primary Care Provider.   __________________________________________________________  The [facility] providers would like to encourage you to use [facility] to communicate with providers for non-urgent requests or questions.  Due to long hold times on the telephone, sending your provider a message by [facility] may be a faster and more efficient way to get a response.  Please allow 48 business hours for a response.  Please remember that this is for non-urgent requests.  __________________________________________________________  Your provider has requested that you go to the basement level for lab work before leaving today. Press "B" on the elevator. The lab is located at the first door on the left as you exit the elevator.  Due to recent changes in healthcare laws, you may see the results of your imaging and laboratory studies on MyChart before your provider has had a chance to review them.  We understand that in some cases there may be results that are confusing or concerning to you. Not all laboratory results come back in the same time frame and the provider may be waiting for multiple results in order to interpret others.  Please give [place] 48 hours in order for your provider to thoroughly review all the results before contacting the office for clarification of your results.   Thank you for entrusting me with your care and choosing [facility].  Dr Christella Hartigan

## 2020-12-15 NOTE — Progress Notes (Signed)
Review of pertinent gastrointestinal problems: 1.  Chronic hepatitis B, alcohol related liver disease: Alcoholic, drinking 1 bottle of wine daily for many years.  Hep BE antigen positive, high hepatitis B DNA titer at 4 billion copies.  Mildly elevated transaminases ALT 41. Chronic hepatitis B managed by [facility], started tenofovir March 2019 Liver biopsy November 2021.  Mildly active chronic hepatitis (grade 2 out of 4) consistent with history of chronic hepatitis B.  Mild fibrosis (stage 1-2 out of 4) Last office visit June 2022 at [facility] 2.  Urinary bladder rupture, proven December 2021 eventually urologic surgery.  Unclear etiology however her bladder wall was noted to be quite thin and there were some small bowel adhesions up against the bladder wall. 3.  Abdominal pain and pneumoperitoneum November 2021 eventually underwent laparoscopy by general surgery without clear etiology noted.  EGD at the same hospitalization showed mild gastritis, pathology showed no H. pylori, small bowel ampullary biopsies were also normal, benign small bowel. 4.  Colonoscopy July 2020 Dr. Christella Hartigan for abdominal pain showed normal terminal ileum and normal colon.  EGD February 2020 Dr. Christella Hartigan for upper abdominal pain found mild gastritis which was negative for H. pylori.  The examination was otherwise normal.   HPI: This is a pleasant 35 year old woman whom I last saw 3 to 4 months ago.  Her weight is exactly the same today as it was 4 months ago.   2 weeks she has noticed periumbilical pains.  These occur daily, they last about 20 minutes, she calls them a dull ache, she has had no fevers chills nausea or vomiting.  Pains can radiate to her right flank or her left flank.  She has also had some urinary burning.  She tells me she took some antibiotics which she has been given for postcoital UTIs.  This helped her burning.  She missed her period last month.  She thinks she has been  a bit constipated but she cannot explain exactly why she thinks that.  She is still moving her bowels every other day and she does not have to push or strain.  She still drinks alcohol, seemingly daily.  She drinks a full bottle of 1 herself per week and also 6 or 7 beers per week.   ROS: complete GI ROS as described in HPI, all other review negative.  Constitutional:  No unintentional weight loss   Past Medical History:  Diagnosis Date   Abdominal pain    started in mid abd and spread to left side/on and off/since Jan 2020/ pt states, she has fluid in abd/unknown   Abnormal Pap smear    normal 06/2013   Anxiety    Constipation    hx of   Depression    suicide attempt in college   Diabetes mellitus without complication (HCC)    gestational, no issues since pregnancy per her report - resolved   Gestational diabetes    /no meds now - resolved   Hepatitis    Hep B, managed by Dr. Kinnie Scales   History of chicken pox    History of suicide attempt 2003   Attempt in college   History of UTI    Vaginal Pap smear, abnormal     Past Surgical History:  Procedure Laterality Date   BIOPSY  05/10/2020   Procedure: BIOPSY;  Surgeon: Benancio Deeds, MD;  Location: [facility];  Service: Gastroenterology;;   BLADDER REPAIR N/A 05/17/2020   Procedure: EXPLORATORY LAPAROTOMY  WITH CYSTORRHAPHY;  Surgeon: Marcine Matar, MD;  Location: [facility];  Service: Urology;  Laterality: N/A;   CESAREAN SECTION N/A 01/26/2013   Procedure: Primary Cesarean Section Delivery Baby  Boy @ 0017, Apgars 4/8/9;  Surgeon: Freddrick March. Tenny Craw, MD;  Location: [facility];  Service: Obstetrics;  Laterality: N/A;   CESAREAN SECTION N/A 11/18/2017   Procedure: CESAREAN SECTION;  Surgeon: Olivia Mackie, MD;  Location: [facility];  Service: Obstetrics;  Laterality: N/A;   COLONOSCOPY  2020   DIAGNOSTIC LAPAROSCOPIC LIVER BIOPSY N/A 05/07/2020   Procedure: DIAGNOSTIC LAPAROSCOPIC;  Surgeon: Karie Soda, MD;   Location: [facility];  Service: General;  Laterality: N/A;   DIAGNOSTIC LAPAROSCOPY  08/25/2018   Dr Clifton James with Ob/Gyn3/2020   ESOPHAGOGASTRODUODENOSCOPY (EGD) WITH PROPOFOL N/A 05/10/2020   Procedure: ESOPHAGOGASTRODUODENOSCOPY (EGD) WITH PROPOFOL;  Surgeon: Benancio Deeds, MD;  Location: [facility];  Service: Gastroenterology;  Laterality: N/A;   ESOPHAGOGASTRODUODENOSCOPY ENDOSCOPY     IR TRANSCATHETER BX  04/18/2020   IR US GUIDE VASC ACCESS RIGHT  04/18/2020   IR VENOGRAM HEPATIC W HEMODYNAMIC EVALUATION  04/18/2020   NASAL SINUS SURGERY  1996   WISDOM TOOTH EXTRACTION      Current Outpatient Medications  Medication Sig Dispense Refill   tenofovir (VIREAD) 300 MG tablet Take 300 mg by mouth daily.     No current facility-administered medications for this visit.    Allergies as of 12/15/2020 - Review Complete 12/15/2020  Allergen Reaction Noted   Contrast media [iodinated diagnostic agents] Anaphylaxis, Shortness Of Breath, and Rash 06/26/2018   Nsaids Other (See Comments) 05/07/2020   Prednisone & diphenhydramine Other (See Comments) 02/28/2020    Family History  Adopted: Yes  Family history unknown: Yes    Social History   Socioeconomic History   Marital status: Married    Spouse name: Not on file   Number of children: 1   Years of education: college   Highest education level: Not on file  Occupational History   Occupation: Environmental manager  Tobacco Use   Smoking status: Former    Packs/day: 1.00    Pack years: 0.00    Types: Cigarettes    Quit date: 07/23/2011    Years since quitting: 9.4   Smokeless tobacco: Never   Tobacco comments:    smoked for 8 years, quit in 2013  Vaping Use   Vaping Use: Never used  Substance and Sexual Activity   Alcohol use: Not Currently    Alcohol/week: 5.0 standard drinks    Types: 5 Glasses of wine per week    Comment: h/o binge drinking - more abstinent 2021-   Drug use: Not Currently    Types: Marijuana    Comment: Last  use 12/19/18   Sexual activity: Yes  Other Topics Concern   Not on file  Social History Narrative   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  FHx unknown   Social Determinants of Corporate investment banker Strain: Not on file  Food Insecurity: Not on file  Transportation Needs: Not on file  Physical Activity: Not on file  Stress: Not on file  Social Connections: Not on file  Intimate Partner Violence: Not on file  Physical Exam: BP 98/64   Pulse 68   Ht 4\' 11"  (1.499 m)   Wt 124 lb (56.2 kg)   LMP 11/03/2020 (Exact Date)   SpO2 99%   BMI 25.04 kg/m  Constitutional: generally well-appearing Psychiatric: alert and oriented x3 Abdomen: soft, mildly tender periumbilically nondistended, no obvious ascites, no peritoneal signs, normal bowel sounds No peripheral edema noted in lower extremities  Assessment and plan: 35 y.o. female with intermittent periumbilical discomforts, recent dysuria, missed her last period  She tells me her husband has had a vasectomy and she has not had intercourse with anyone else.  Obviously quite unlikely that she is pregnant but with her missed period.  I am getting a urine pregnancy test to be safe.  She has an appointment with a gynecologist already set up as well.  Unclear etiology of her lower abdominal discomforts.  Perhaps they are related to urinary issues.  She did have a ruptured urinary bladder less than a year ago.  Admitting blood work including CBC, complete metabolic profile as well as urinalysis and urine culture today.   Please see the "Patient Instructions" section for addition details about the plan.  31, MD [facility] 12/15/2020, 8:46 AM   Total time on date of encounter was 45 minutes  (this included time spent preparing to see the patient reviewing records; obtaining and/or reviewing separately obtained history; performing a medically appropriate exam and/or evaluation; counseling and educating the patient and family if present; ordering medications, tests or procedures if applicable; and documenting clinical information in the health record).

## 2020-12-17 LAB — URINE CULTURE
MICRO NUMBER:: 12096990
SPECIMEN QUALITY:: ADEQUATE

## 2020-12-17 LAB — PREGNANCY, URINE: Preg Test, Ur: NEGATIVE

## 2021-01-01 DIAGNOSIS — R102 Pelvic and perineal pain: Secondary | ICD-10-CM | POA: Diagnosis not present

## 2021-01-05 ENCOUNTER — Telehealth: Payer: BC Managed Care – PPO | Admitting: Emergency Medicine

## 2021-01-05 DIAGNOSIS — L299 Pruritus, unspecified: Secondary | ICD-10-CM

## 2021-01-05 MED ORDER — HYDROXYZINE HCL 10 MG PO TABS: 10.0000 mg | ORAL_TABLET | Freq: Four times a day (QID) | ORAL | 0 refills | Status: DC | PRN

## 2021-01-05 MED ORDER — CETIRIZINE HCL 10 MG PO TABS: 10.0000 mg | ORAL_TABLET | Freq: Every day | ORAL | 1 refills | Status: DC

## 2021-01-05 NOTE — Patient Instructions (Signed)
Discontinue your body wash.  Take medications as directed.  If your symptoms don't improve in 2 days, you should be seen in person.  Call 911 for throat swelling sensation or difficulty breathing.

## 2021-01-05 NOTE — Progress Notes (Signed)
Virtual Visit Consent   Kim Pacheco, you are scheduled for a virtual visit with a [facility] provider today.     Just as with appointments in the office, your consent must be obtained to participate.  Your consent will be active for this visit and any virtual visit you may have with one of our providers in the next 365 days.     If you have a MyChart account, a copy of this consent can be sent to you electronically.  All virtual visits are billed to your insurance company just like a traditional visit in the office.    As this is a virtual visit, video technology does not allow for your provider to perform a traditional examination.  This may limit your provider's ability to fully assess your condition.  If your provider identifies any concerns that need to be evaluated in person or the need to arrange testing (such as labs, EKG, etc.), we will make arrangements to do so.     Although advances in technology are sophisticated, we cannot ensure that it will always work on either your end or our end.  If the connection with a video visit is poor, the visit may have to be switched to a telephone visit.  With either a video or telephone visit, we are not always able to ensure that we have a secure connection.     I need to obtain your verbal consent now.   Are you willing to proceed with your visit today?    Kim Pacheco has provided verbal consent on 01/05/2021 for a virtual visit video.   Roxy Horseman, PA-C   Date: 01/05/2021 3:47 PM   Virtual Visit via Video Note   I, Roxy Horseman, connected with  Kim Pacheco  ([id number], 04-23-1986) on 01/05/21 at  3:45 PM EDT by a video-enabled telemedicine application and verified that I am speaking with the correct person using two identifiers.  Location: Patient: Virtual Visit Location Patient: Home Provider: Virtual Visit Location Provider: Home Office   I discussed the limitations of evaluation and management  by telemedicine and the availability of in person appointments. The patient expressed understanding and agreed to proceed.    History of Present Illness: Kim Pacheco is a 35 y.o. who identifies as a female who was assigned female at birth, and is being seen today for generalized pruritus.  Onset after starting a new body wash, but pt doesn't think that is the source because she is itchy on her scalp too.  Has tried benadryl, but it "knocks her out."  Denies fevers or chills.  Denies SOB.  Denies n/v/d.  HPI: HPI  Problems:  Patient Active Problem List   Diagnosis Date Noted   Extraperitoneal bladder perforation 05/17/2020   History of recurrent UTI (urinary tract infection) 05/08/2020   Free intraperitoneal air 05/08/2020   Anxiety    Abnormal CT of the abdomen    Hepatic steatosis 05/07/2020   Pneumoperitoneum 05/07/2020   Foley catheter in place 05/07/2020   Anal fissure 05/07/2020   Change in bowel habit 05/07/2020   Periumbilical pain 05/07/2020   Rectal bleeding 05/07/2020   History of gestational diabetes 05/07/2020   History of depression 05/07/2020   Portal hypertension (HCC)    History of urinary retention 04/15/2020   Chronic abdominal pain 04/13/2020   Narcolepsy without cataplexy 02/02/2020   Depression, major, single episode, moderate (HCC) 10/28/2018   Elevated CA-125 08/06/2018   ARF (acute renal failure) (HCC)  06/25/2018   Alcohol abuse 06/24/2018   Constipation, chronic 06/24/2018   Ascites 06/24/2018   Body mass index (BMI) of 25.0 to 29.9 05/29/2018   Fibrocystic disease of breast 05/29/2018   Metrorrhagia 05/29/2018   Urinary tract infectious disease 05/29/2018   Oral hypoglycemic controlled White classification A2 gestational diabetes mellitus (GDM) 11/18/2017   Previous cesarean delivery, delivered: arrest of dilation  11/18/2017   Abnormal glucose tolerance test (GTT) during pregnancy, antepartum 09/17/2017   Chronic active viral hepatitis B  (HCC) 06/14/2014    Allergies:  Allergies  Allergen Reactions   Contrast Media [Iodinated Diagnostic Agents] Anaphylaxis, Shortness Of Breath and Rash    IV contrast    Nsaids Other (See Comments)    Hx gastritis & gastric perforation    Prednisone & Diphenhydramine Other (See Comments)    Don't like to take it   Medications:  Current Outpatient Medications:    tenofovir (VIREAD) 300 MG tablet, Take 300 mg by mouth daily., Disp: , Rfl:   Observations/Objective: Patient is well-developed, well-nourished in no acute distress.  Resting comfortably at home.  Head is normocephalic, atraumatic.  No labored breathing.  Speech is clear and coherent with logical content.  Patient is alert and oriented at baseline.  No hives seen Clear speech  Assessment and Plan: 1. Pruritus - Discontinue body wash - Zyrtec daily - Vistaril q6hr PRN for itching  Follow Up Instructions: I discussed the assessment and treatment plan with the patient. The patient was provided an opportunity to ask questions and all were answered. The patient agreed with the plan and demonstrated an understanding of the instructions.  A copy of instructions were sent to the patient via MyChart.  The patient was advised to call back or seek an in-person evaluation if the symptoms worsen or if the condition fails to improve as anticipated.  Time:  I spent 10 minutes with the patient via telehealth technology discussing the above problems/concerns.    Roxy Horseman, PA-C

## 2021-01-24 ENCOUNTER — Telehealth (INDEPENDENT_AMBULATORY_CARE_PROVIDER_SITE_OTHER): Payer: BC Managed Care – PPO | Admitting: Adult Health

## 2021-01-24 ENCOUNTER — Encounter: Payer: Self-pay | Admitting: Adult Health

## 2021-01-24 VITALS — HR 71 | Temp 98.6°F | Ht 59.0 in | Wt 125.0 lb

## 2021-01-24 DIAGNOSIS — J029 Acute pharyngitis, unspecified: Secondary | ICD-10-CM

## 2021-01-24 DIAGNOSIS — R110 Nausea: Secondary | ICD-10-CM | POA: Diagnosis not present

## 2021-01-24 DIAGNOSIS — R059 Cough, unspecified: Secondary | ICD-10-CM | POA: Diagnosis not present

## 2021-01-24 DIAGNOSIS — B349 Viral infection, unspecified: Secondary | ICD-10-CM | POA: Diagnosis not present

## 2021-01-24 MED ORDER — ONDANSETRON HCL 4 MG PO TABS: 4.0000 mg | ORAL_TABLET | Freq: Three times a day (TID) | ORAL | 0 refills | Status: DC | PRN

## 2021-01-24 MED ORDER — MAGIC MOUTHWASH W/LIDOCAINE
5.0000 mL | Freq: Three times a day (TID) | ORAL | 0 refills | Status: DC | PRN
Start: 2021-01-24 — End: 2021-03-09

## 2021-01-24 NOTE — Progress Notes (Signed)
Virtual Visit via Video Note  I connected with Kim Pacheco on 01/24/21 at 10:30 AM EDT by a video enabled telemedicine application and verified that I am speaking with the correct person using two identifiers.  Location patient: home Location provider:work or home office Persons participating in the virtual visit: patient, provider  I discussed the limitations of evaluation and management by telemedicine and the availability of in person appointments. The patient expressed understanding and agreed to proceed.   HPI: 35 year old female who is being evaluated today for an acute issue.  Reports that she woke up yesterday with a sore throat.  Today when she woke up she had developed a dry cough and some mild nausea and fatigue, the sore throat has continued.  She denies fever, chills, vomiting, sinus pain or pressure, abdominal pain, swollen lymph nodes, or pain when she swallows.  She took a COVID test yesterday which was negative.  He has not used anything over-the-counter     ROS: See pertinent positives and negatives per HPI.  Past Medical History:  Diagnosis Date   Abdominal pain    started in mid abd and spread to left side/on and off/since Jan 2020/ pt states, she has fluid in abd/unknown   Abnormal Pap smear    normal 06/2013   Anxiety    Constipation    hx of   Depression    suicide attempt in college   Diabetes mellitus without complication (HCC)    gestational, no issues since pregnancy per her report - resolved   Gestational diabetes    /no meds now - resolved   Hepatitis    Hep B, managed by Dr. Kinnie Scales   History of chicken pox    History of suicide attempt 2003   Attempt in college   History of UTI    Vaginal Pap smear, abnormal     Past Surgical History:  Procedure Laterality Date   BIOPSY  05/10/2020   Procedure: BIOPSY;  Surgeon: Benancio Deeds, MD;  Location: [facility];  Service: Gastroenterology;;   BLADDER REPAIR N/A 05/17/2020    Procedure: EXPLORATORY LAPAROTOMY WITH CYSTORRHAPHY;  Surgeon: Marcine Matar, MD;  Location: [facility];  Service: Urology;  Laterality: N/A;   CESAREAN SECTION N/A 01/26/2013   Procedure: Primary Cesarean Section Delivery Baby  Boy @ 0017, Apgars 4/8/9;  Surgeon: Freddrick March. Tenny Craw, MD;  Location: [facility];  Service: Obstetrics;  Laterality: N/A;   CESAREAN SECTION N/A 11/18/2017   Procedure: CESAREAN SECTION;  Surgeon: Olivia Mackie, MD;  Location: [facility];  Service: Obstetrics;  Laterality: N/A;   COLONOSCOPY  2020   DIAGNOSTIC LAPAROSCOPIC LIVER BIOPSY N/A 05/07/2020   Procedure: DIAGNOSTIC LAPAROSCOPIC;  Surgeon: Karie Soda, MD;  Location: [facility];  Service: General;  Laterality: N/A;   DIAGNOSTIC LAPAROSCOPY  08/25/2018   Dr Clifton James with Ob/Gyn3/2020   ESOPHAGOGASTRODUODENOSCOPY (EGD) WITH PROPOFOL N/A 05/10/2020   Procedure: ESOPHAGOGASTRODUODENOSCOPY (EGD) WITH PROPOFOL;  Surgeon: Benancio Deeds, MD;  Location: [facility];  Service: Gastroenterology;  Laterality: N/A;   ESOPHAGOGASTRODUODENOSCOPY ENDOSCOPY     IR TRANSCATHETER BX  04/18/2020   IR US GUIDE VASC ACCESS RIGHT  04/18/2020   IR VENOGRAM HEPATIC W HEMODYNAMIC EVALUATION  04/18/2020   NASAL SINUS SURGERY  1996   WISDOM TOOTH EXTRACTION      Family History  Adopted: Yes  Family history unknown: Yes       Current Outpatient Medications:    tenofovir (VIREAD) 300 MG tablet, Take 300 mg by mouth  daily., Disp: , Rfl:    cetirizine (ZYRTEC ALLERGY) 10 MG tablet, Take 1 tablet (10 mg total) by mouth daily. (Patient not taking: Reported on 01/24/2021), Disp: 30 tablet, Rfl: 1  EXAM:  VITALS per patient if applicable:  GENERAL: alert, oriented, appears well and in no acute distress  HEENT: atraumatic, conjunttiva clear, no obvious abnormalities on inspection of external nose and ears  NECK: normal movements of the head and neck  LUNGS: on inspection no signs of respiratory distress, breathing rate appears  normal, no obvious gross SOB, gasping or wheezing  CV: no obvious cyanosis  MS: moves all visible extremities without noticeable abnormality  PSYCH/NEURO: pleasant and cooperative, no obvious depression or anxiety, speech and thought processing grossly intact  ASSESSMENT AND PLAN:  Discussed the following assessment and plan:  1. Viral illness -Symptoms likely viral.  Described the natural course of a viral infection with the patient.  If not feeling any better in the next 7 to 10 days and follow-up with PCP.  Can follow-up if sooner if symptoms worsen  2. Nausea  - ondansetron (ZOFRAN) 4 MG tablet; Take 1 tablet (4 mg total) by mouth every 8 (eight) hours as needed for nausea or vomiting.  Dispense: 20 tablet; Refill: 0  3. Cough - OTC Mucinex recommended  4. Sore throat  - magic mouthwash w/lidocaine SOLN; Take 5 mLs by mouth 3 (three) times daily as needed for mouth pain (gargle and spit).  Dispense: 180 mL; Refill: 0      I discussed the assessment and treatment plan with the patient. The patient was provided an opportunity to ask questions and all were answered. The patient agreed with the plan and demonstrated an understanding of the instructions.   The patient was advised to call back or seek an in-person evaluation if the symptoms worsen or if the condition fails to improve as anticipated.   Shirline Frees, NP

## 2021-01-29 ENCOUNTER — Other Ambulatory Visit: Payer: Self-pay

## 2021-01-30 ENCOUNTER — Ambulatory Visit: Payer: BC Managed Care – PPO | Admitting: Family Medicine

## 2021-01-30 VITALS — BP 124/70 | HR 80 | Temp 97.6°F | Wt 127.0 lb

## 2021-01-30 DIAGNOSIS — J219 Acute bronchiolitis, unspecified: Secondary | ICD-10-CM | POA: Diagnosis not present

## 2021-01-30 MED ORDER — ALBUTEROL SULFATE HFA 108 (90 BASE) MCG/ACT IN AERS: 2.0000 | INHALATION_SPRAY | RESPIRATORY_TRACT | 0 refills | Status: DC | PRN

## 2021-01-30 NOTE — Progress Notes (Signed)
Established Patient Office Visit  Subjective:  Patient ID: Kim Pacheco, female    DOB: [birth year]  Age: 35 y.o. MRN: [id number]  CC:  Chief Complaint  Patient presents with   Nasal Congestion    Drainage, sore throat, productive cough, x 1 week     HPI Kim Pacheco presents for some persistent upper respiratory symptoms.  She had virtual visit last week.  She states she developed symptoms last week around Tuesday or Wednesday of some ear pressure, sinus pressure, cough, sore throat, nasal congestion.  She did COVID test at home Wednesday which was negative and then repeated that yesterday again negative.  No fever.  Her cough is occasionally productive of clear sputum.  No hemoptysis.  No dyspnea.  Cough is her main symptom at this time.  Non-smoker.  Her sore throat is starting to improve some.  Past Medical History:  Diagnosis Date   Abdominal pain    started in mid abd and spread to left side/on and off/since Jan 2020/ pt states, she has fluid in abd/unknown   Abnormal Pap smear    normal 06/2013   Anxiety    Constipation    hx of   Depression    suicide attempt in college   Diabetes mellitus without complication (HCC)    gestational, no issues since pregnancy per her report - resolved   Gestational diabetes    /no meds now - resolved   Hepatitis    Hep B, managed by Dr. Kinnie Scales   History of chicken pox    History of suicide attempt 2003   Attempt in college   History of UTI    Vaginal Pap smear, abnormal     Past Surgical History:  Procedure Laterality Date   BIOPSY  05/10/2020   Procedure: BIOPSY;  Surgeon: Benancio Deeds, MD;  Location: [facility];  Service: Gastroenterology;;   BLADDER REPAIR N/A 05/17/2020   Procedure: EXPLORATORY LAPAROTOMY WITH CYSTORRHAPHY;  Surgeon: Marcine Matar, MD;  Location: [facility];  Service: Urology;  Laterality: N/A;   CESAREAN SECTION N/A 01/26/2013   Procedure: Primary Cesarean Section Delivery Baby   Boy @ 0017, Apgars 4/8/9;  Surgeon: Freddrick March. Tenny Craw, MD;  Location: [facility];  Service: Obstetrics;  Laterality: N/A;   CESAREAN SECTION N/A 11/18/2017   Procedure: CESAREAN SECTION;  Surgeon: Olivia Mackie, MD;  Location: [facility];  Service: Obstetrics;  Laterality: N/A;   COLONOSCOPY  2020   DIAGNOSTIC LAPAROSCOPIC LIVER BIOPSY N/A 05/07/2020   Procedure: DIAGNOSTIC LAPAROSCOPIC;  Surgeon: Karie Soda, MD;  Location: [facility];  Service: General;  Laterality: N/A;   DIAGNOSTIC LAPAROSCOPY  08/25/2018   Dr Clifton James with Ob/Gyn3/2020   ESOPHAGOGASTRODUODENOSCOPY (EGD) WITH PROPOFOL N/A 05/10/2020   Procedure: ESOPHAGOGASTRODUODENOSCOPY (EGD) WITH PROPOFOL;  Surgeon: Benancio Deeds, MD;  Location: [facility];  Service: Gastroenterology;  Laterality: N/A;   ESOPHAGOGASTRODUODENOSCOPY ENDOSCOPY     IR TRANSCATHETER BX  04/18/2020   IR US GUIDE VASC ACCESS RIGHT  04/18/2020   IR VENOGRAM HEPATIC W HEMODYNAMIC EVALUATION  04/18/2020   NASAL SINUS SURGERY  1996   WISDOM TOOTH EXTRACTION      Family History  Adopted: Yes  Family history unknown: Yes    Social History   Socioeconomic History   Marital status: Married    Spouse name: Not on file   Number of children: 1   Years of education: college   Highest education level: Not on file  Occupational History   Occupation:  photographer  Tobacco Use   Smoking status: Former    Packs/day: 1.00    Types: Cigarettes    Quit date: 07/23/2011    Years since quitting: 9.5   Smokeless tobacco: Never   Tobacco comments:    smoked for 8 years, quit in 2013  Vaping Use   Vaping Use: Never used  Substance and Sexual Activity   Alcohol use: Not Currently    Alcohol/week: 5.0 standard drinks    Types: 5 Glasses of wine per week    Comment: h/o binge drinking - more abstinent 2021-   Drug use: Not Currently    Types: Marijuana    Comment: Last use 12/19/18   Sexual activity: Yes  Other Topics Concern   Not on file  Social  History Narrative   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  FHx unknown   Social Determinants of Corporate investment banker Strain: Not on file  Food Insecurity: Not on file  Transportation Needs: Not on file  Physical Activity: Not on file  Stress: Not on file  Social Connections: Not on file  Intimate Partner Violence: Not on file    Outpatient Medications Prior to Visit  Medication Sig Dispense Refill   cetirizine (ZYRTEC ALLERGY) 10 MG tablet Take 1 tablet (10 mg total) by mouth daily. 30 tablet 1   magic mouthwash w/lidocaine SOLN Take 5 mLs by mouth 3 (three) times daily as needed for mouth pain (gargle and spit). 180 mL 0   ondansetron (ZOFRAN) 4 MG tablet Take 1 tablet (4 mg total) by mouth every 8 (eight) hours as needed for nausea or vomiting. 20 tablet 0   tenofovir (VIREAD) 300 MG tablet Take 300 mg by mouth daily.     No facility-administered medications prior to visit.    Allergies  Allergen Reactions   Contrast Media [Iodinated Diagnostic Agents] Anaphylaxis, Shortness Of Breath and Rash    IV contrast    Nsaids Other (See Comments)    Hx gastritis & gastric perforation    Prednisone & Diphenhydramine Other (See Comments)    Don't like to take it    ROS Review of Systems  Constitutional:  Negative for chills and fever.  HENT:  Positive for congestion.   Respiratory:  Positive for cough. Negative for shortness of breath.      Objective:    Physical Exam Vitals reviewed.  Constitutional:      Appearance: Normal appearance.  HENT:     Right Ear: Tympanic membrane normal.     Left Ear: Tympanic membrane normal.     Mouth/Throat:     Mouth: Mucous membranes are moist.     Pharynx: Oropharynx is clear. No oropharyngeal exudate or posterior  oropharyngeal erythema.  Cardiovascular:     Rate and Rhythm: Normal rate and regular rhythm.  Pulmonary:     Effort: Pulmonary effort is normal. No respiratory distress.     Comments: She has a few faint wheezes in her lower lungs left greater than right but no rales.  No respiratory distress. Musculoskeletal:     Cervical back: Neck supple.  Lymphadenopathy:     Cervical: No cervical adenopathy.  Neurological:     Mental  Status: She is alert.    BP 124/70 (BP Location: Left Arm, Patient Position: Sitting, Cuff Size: Normal)   Pulse 80   Temp 97.6 F (36.4 C) (Oral)   Wt 127 lb (57.6 kg)   SpO2 97%   BMI 25.65 kg/m  Wt Readings from Last 3 Encounters:  01/30/21 127 lb (57.6 kg)  01/24/21 125 lb (56.7 kg)  12/15/20 124 lb (56.2 kg)     Health Maintenance Due  Topic Date Due   Pneumococcal Vaccine 55-61 Years old (1 - PCV) Never done   COVID-19 Vaccine (3 - Booster for Pfizer series) 02/21/2020   PAP SMEAR-Modifier  02/26/2020   INFLUENZA VACCINE  01/08/2021    There are no preventive care reminders to display for this patient.  Lab Results  Component Value Date   TSH 1.241 04/14/2020   Lab Results  Component Value Date   WBC 5.4 12/15/2020   HGB 13.4 12/15/2020   HCT 38.8 12/15/2020   MCV 89.7 12/15/2020   PLT 206.0 12/15/2020   Lab Results  Component Value Date   NA 137 12/15/2020   K 4.6 12/15/2020   CO2 25 12/15/2020   GLUCOSE 86 12/15/2020   BUN 21 12/15/2020   CREATININE 0.67 12/15/2020   BILITOT 0.6 12/15/2020   ALKPHOS 38 (L) 12/15/2020   AST 26 12/15/2020   ALT 33 12/15/2020   PROT 7.8 12/15/2020   ALBUMIN 4.3 12/15/2020   CALCIUM 9.0 12/15/2020   ANIONGAP 5 05/19/2020   GFR 112.03 12/15/2020   Lab Results  Component Value Date   CHOL 166 08/27/2019   Lab Results  Component Value Date   HDL 56.80 08/27/2019   Lab Results  Component Value Date   LDLCALC 90 08/27/2019   Lab Results  Component Value Date   TRIG 96.0 08/27/2019    Lab Results  Component Value Date   CHOLHDL 3 08/27/2019   Lab Results  Component Value Date   HGBA1C 5.6 08/27/2019      Assessment & Plan:   Probable viral URI with cough.  Faint wheezing on exam but no respiratory distress.  COVID testing negative x2  -We discussed possible prednisone taper but she prefers to avoid prednisone if possible. -Albuterol MDI 2 puffs every 4-6 hours as needed for cough and wheeze -Follow-up for persistent or worsening symptoms especially for any new symptoms such as fever  Meds ordered this encounter  Medications   albuterol (VENTOLIN HFA) 108 (90 Base) MCG/ACT inhaler    Sig: Inhale 2 puffs into the lungs every 4 (four) hours as needed for wheezing or shortness of breath.    Dispense:  8 g    Refill:  0    Follow-up: No follow-ups on file.    Evelena Peat, MD

## 2021-02-05 ENCOUNTER — Other Ambulatory Visit: Payer: Self-pay

## 2021-02-05 ENCOUNTER — Ambulatory Visit: Payer: BC Managed Care – PPO | Admitting: Family Medicine

## 2021-02-05 ENCOUNTER — Encounter: Payer: Self-pay | Admitting: Family Medicine

## 2021-02-05 VITALS — BP 110/82 | HR 75 | Temp 98.1°F | Wt 127.6 lb

## 2021-02-05 DIAGNOSIS — B084 Enteroviral vesicular stomatitis with exanthem: Secondary | ICD-10-CM

## 2021-02-05 NOTE — Progress Notes (Signed)
+Subjective:    Patient ID: Kim Pacheco, female    DOB: 10-09-1985, 35 y.o.   MRN: [id number]  Chief Complaint  Patient presents with   bumps    Got mani pedi on Friday, noticed bumps on Sunday, states they are painful. Has not taken anything or used any cream for relief. Also states she has bumps in mouth but does not think it is not related. Said she though it was Hands mouth feet but she has not had a fever.     HPI Patient was seen today for acute concern.  Patient endorses bumps on hands and feet after antimanic pedicure on Friday while out of town.  Bumps are more painful than pruritic.  Patient's family without symptoms.  Has not tried anything for symptoms.  Patient endorses recent viral illness seen via e-visit on 8/17 and in office on 8/23 for bronchitis.    Past Medical History:  Diagnosis Date   Abdominal pain    started in mid abd and spread to left side/on and off/since Jan 2020/ pt states, she has fluid in abd/unknown   Abnormal Pap smear    normal 06/2013   Anxiety    Constipation    hx of   Depression    suicide attempt in college   Diabetes mellitus without complication (HCC)    gestational, no issues since pregnancy per her report - resolved   Gestational diabetes    /no meds now - resolved   Hepatitis    Hep B, managed by Dr. Kinnie Scales   History of chicken pox    History of suicide attempt 2003   Attempt in college   History of UTI    Vaginal Pap smear, abnormal     Allergies  Allergen Reactions   Contrast Media [Iodinated Diagnostic Agents] Anaphylaxis, Shortness Of Breath and Rash    IV contrast    Nsaids Other (See Comments)    Hx gastritis & gastric perforation    Prednisone & Diphenhydramine Other (See Comments)    Don't like to take it    ROS General: Denies fever, chills, night sweats, changes in weight, changes in appetite HEENT: Denies headaches, ear pain, changes in vision, rhinorrhea, sore throat CV: Denies CP, palpitations, SOB,  orthopnea Pulm: Denies SOB, cough, wheezing GI: Denies abdominal pain, nausea, vomiting, diarrhea, constipation GU: Denies dysuria, hematuria, frequency, vaginal discharge Msk: Denies muscle cramps, joint pains Neuro: Denies weakness, numbness, tingling Skin: Denies rashes, bruising  + bumps on hands, feet, tongue Psych: Denies depression, anxiety, hallucinations    Objective:    Blood pressure 110/82, pulse 75, temperature 98.1 F (36.7 C), temperature source Oral, weight 127 lb 9.6 oz (57.9 kg), SpO2 99 %.  Gen. Pleasant, well-nourished, in no distress, normal affect   HEENT: [place]/AT, face symmetric, conjunctiva clear, no scleral icterus, PERRLA, EOMI, nares patent without drainage, erythematous papule on tongue pharynx without erythema or exudate. Lungs: no accessory muscle use, CTAB, no wheezes or rales Cardiovascular: RRR, no m/r/g, no peripheral edema Musculoskeletal: No deformities, no cyanosis or clubbing, normal tone Neuro:  A&Ox3, CN II-XII intact, normal gait Skin:  Warm, dry, intact.  Several erythematous/flesh colored papules on bilateral hands, feet.  An erythematous papule on tongue.   Wt Readings from Last 3 Encounters:  02/05/21 127 lb 9.6 oz (57.9 kg)  01/30/21 127 lb (57.6 kg)  01/24/21 125 lb (56.7 kg)    Lab Results  Component Value Date   WBC 5.4 12/15/2020   HGB 13.4  12/15/2020   HCT 38.8 12/15/2020   PLT 206.0 12/15/2020   GLUCOSE 86 12/15/2020   CHOL 166 08/27/2019   TRIG 96.0 08/27/2019   HDL 56.80 08/27/2019   LDLCALC 90 08/27/2019   ALT 33 12/15/2020   AST 26 12/15/2020   NA 137 12/15/2020   K 4.6 12/15/2020   CL 105 12/15/2020   CREATININE 0.67 12/15/2020   BUN 21 12/15/2020   CO2 25 12/15/2020   TSH 1.241 04/14/2020   INR 1.0 05/07/2020   HGBA1C 5.6 08/27/2019    Assessment/Plan:  Hand, foot and mouth disease -Discussed supportive care including topical analgesics if needed given history of GI bleed and hep B on tenofovir, cortisone  cream, gargling with warm salt water Chloraseptic spray as needed. -Given handout -Continue to monitor  F/u as needed with PCP  Abbe Amsterdam, MD

## 2021-02-13 ENCOUNTER — Encounter (HOSPITAL_COMMUNITY): Payer: Self-pay

## 2021-02-13 ENCOUNTER — Emergency Department (HOSPITAL_COMMUNITY)
Admission: EM | Admit: 2021-02-13 | Discharge: 2021-02-13 | Disposition: A | Discharge status: Home / Self Care | Payer: BC Managed Care – PPO | Attending: Emergency Medicine | Admitting: Emergency Medicine

## 2021-02-13 ENCOUNTER — Other Ambulatory Visit: Payer: Self-pay

## 2021-02-13 ENCOUNTER — Emergency Department (HOSPITAL_COMMUNITY): Payer: BC Managed Care – PPO

## 2021-02-13 DIAGNOSIS — R39198 Other difficulties with micturition: Secondary | ICD-10-CM | POA: Diagnosis not present

## 2021-02-13 DIAGNOSIS — Z87891 Personal history of nicotine dependence: Secondary | ICD-10-CM | POA: Insufficient documentation

## 2021-02-13 DIAGNOSIS — B181 Chronic viral hepatitis B without delta-agent: Secondary | ICD-10-CM | POA: Diagnosis not present

## 2021-02-13 DIAGNOSIS — R1032 Left lower quadrant pain: Secondary | ICD-10-CM | POA: Diagnosis not present

## 2021-02-13 DIAGNOSIS — N898 Other specified noninflammatory disorders of vagina: Secondary | ICD-10-CM | POA: Insufficient documentation

## 2021-02-13 DIAGNOSIS — K820 Obstruction of gallbladder: Secondary | ICD-10-CM | POA: Diagnosis not present

## 2021-02-13 DIAGNOSIS — B182 Chronic viral hepatitis C: Secondary | ICD-10-CM | POA: Diagnosis not present

## 2021-02-13 DIAGNOSIS — R6883 Chills (without fever): Secondary | ICD-10-CM | POA: Diagnosis not present

## 2021-02-13 DIAGNOSIS — R1030 Lower abdominal pain, unspecified: Secondary | ICD-10-CM

## 2021-02-13 DIAGNOSIS — R0602 Shortness of breath: Secondary | ICD-10-CM | POA: Diagnosis not present

## 2021-02-13 DIAGNOSIS — E119 Type 2 diabetes mellitus without complications: Secondary | ICD-10-CM | POA: Insufficient documentation

## 2021-02-13 DIAGNOSIS — I878 Other specified disorders of veins: Secondary | ICD-10-CM | POA: Diagnosis not present

## 2021-02-13 DIAGNOSIS — R1031 Right lower quadrant pain: Secondary | ICD-10-CM | POA: Insufficient documentation

## 2021-02-13 LAB — URINALYSIS, ROUTINE W REFLEX MICROSCOPIC
Bilirubin Urine: NEGATIVE
Glucose, UA: NEGATIVE mg/dL
Hgb urine dipstick: NEGATIVE
Ketones, ur: NEGATIVE mg/dL
Leukocytes,Ua: NEGATIVE
Nitrite: NEGATIVE
Protein, ur: NEGATIVE mg/dL
Specific Gravity, Urine: 1.025 (ref 1.005–1.030)
pH: 6 (ref 5.0–8.0)

## 2021-02-13 LAB — COMPREHENSIVE METABOLIC PANEL
ALT: 31 U/L (ref 0–44)
AST: 27 U/L (ref 15–41)
Albumin: 3.9 g/dL (ref 3.5–5.0)
Alkaline Phosphatase: 36 U/L — ABNORMAL LOW (ref 38–126)
Anion gap: 6 (ref 5–15)
BUN: 14 mg/dL (ref 6–20)
CO2: 26 mmol/L (ref 22–32)
Calcium: 8.7 mg/dL — ABNORMAL LOW (ref 8.9–10.3)
Chloride: 106 mmol/L (ref 98–111)
Creatinine, Ser: 0.52 mg/dL (ref 0.44–1.00)
GFR, Estimated: >60 mL/min (ref >60)
Glucose, Bld: 118 mg/dL — ABNORMAL HIGH (ref 70–99)
Potassium: 4.2 mmol/L (ref 3.5–5.1)
Sodium: 138 mmol/L (ref 135–145)
Total Bilirubin: 0.6 mg/dL (ref 0.3–1.2)
Total Protein: 7.6 g/dL (ref 6.5–8.1)

## 2021-02-13 LAB — CBC WITH DIFFERENTIAL/PLATELET
Abs Immature Granulocytes: 0.04 10*3/uL (ref 0.00–0.07)
Basophils Absolute: 0 10*3/uL (ref 0.0–0.1)
Basophils Relative: 0 %
Eosinophils Absolute: 0.1 10*3/uL (ref 0.0–0.5)
Eosinophils Relative: 1 %
HCT: 39.1 % (ref 36.0–46.0)
Hemoglobin: 13.1 g/dL (ref 12.0–15.0)
Immature Granulocytes: 1 %
Lymphocytes Relative: 31 %
Lymphs Abs: 2.6 10*3/uL (ref 0.7–4.0)
MCH: 30.8 pg (ref 26.0–34.0)
MCHC: 33.5 g/dL (ref 30.0–36.0)
MCV: 91.8 fL (ref 80.0–100.0)
Monocytes Absolute: 0.6 10*3/uL (ref 0.1–1.0)
Monocytes Relative: 7 %
Neutro Abs: 5 10*3/uL (ref 1.7–7.7)
Neutrophils Relative %: 60 %
Platelets: 212 10*3/uL (ref 150–400)
RBC: 4.26 MIL/uL (ref 3.87–5.11)
RDW: 12.3 % (ref 11.5–15.5)
WBC: 8.4 10*3/uL (ref 4.0–10.5)
nRBC: 0 % (ref 0.0–0.2)

## 2021-02-13 LAB — LIPASE, BLOOD: Lipase: 37 U/L (ref 11–51)

## 2021-02-13 LAB — I-STAT BETA HCG BLOOD, ED (MC, WL, AP ONLY): I-stat hCG, quantitative: <5 m[IU]/mL (ref <5)

## 2021-02-13 MED ORDER — SODIUM CHLORIDE 0.9 % IV BOLUS: 1000.0000 mL | Freq: Once | INTRAVENOUS | Status: DC

## 2021-02-13 MED ORDER — ONDANSETRON HCL 4 MG/2ML IJ SOLN
4.0000 mg | Freq: Once | INTRAMUSCULAR | Status: AC
  Administered 2021-02-13: 4 mg via INTRAVENOUS
  Filled 2021-02-13: qty 2

## 2021-02-13 MED ORDER — HYDROCODONE-ACETAMINOPHEN 5-325 MG PO TABS: 1.0000 | ORAL_TABLET | ORAL | 0 refills | Status: DC | PRN

## 2021-02-13 MED ORDER — LACTATED RINGERS IV BOLUS
1000.0000 mL | Freq: Once | INTRAVENOUS | Status: AC
  Administered 2021-02-13: 1000 mL via INTRAVENOUS

## 2021-02-13 MED ORDER — MORPHINE SULFATE (PF) 4 MG/ML IV SOLN
4.0000 mg | Freq: Once | INTRAVENOUS | Status: AC
  Administered 2021-02-13: 4 mg via INTRAVENOUS
  Filled 2021-02-13: qty 1

## 2021-02-13 MED ORDER — METOCLOPRAMIDE HCL 5 MG/ML IJ SOLN
10.0000 mg | Freq: Once | INTRAMUSCULAR | Status: AC
  Administered 2021-02-13: 10 mg via INTRAVENOUS
  Filled 2021-02-13: qty 2

## 2021-02-13 NOTE — ED Provider Notes (Signed)
[facility]-EMERGENCY DEPT Provider Note   CSN: [id number] Arrival date & time: 02/13/21  0351     History Chief Complaint  Patient presents with   Abdominal Pain    Kim Pacheco is a 35 y.o. female with a history of intraperitoneal bladder rupture s/p laparotomy with cystorrhaphy, UTI, constipation, diabetes mellitus, anxiety who presents the emergency department with a chief complaint of abdominal pain.  The patient reports that she awoke from sleep at 3 AM with severe, nonradiating, constant sharp, pressure-like bilateral lower abdominal pain.  She states that the pain feels similar to when she was previously diagnosed with an intraperitoneal bladder rupture.    She had a small bowel movement prior to arrival, but this did not improve her pain.  No known aggravating or alleviating factors. She is concerned that she could also be constipated.  She has had some associated chills and discomfort with urination, but denies dysuria, fever, vaginal bleeding or discharge, back pain, flank pain, nausea, vomiting, chest pain, cough.  She does feel minimally short of breath, but is unsure if she is anxious.  No other treatment prior to arrival.  She is a non-smoker.  Reports that she had 3 glasses of wine earlier in the night with dinner.  Denies illicit or recreational substance use.  The history is provided by the patient and medical records. No language interpreter was used.      Past Medical History:  Diagnosis Date   Abdominal pain    started in mid abd and spread to left side/on and off/since Jan 2020/ pt states, she has fluid in abd/unknown   Abnormal Pap smear    normal 06/2013   Anxiety    Constipation    hx of   Depression    suicide attempt in college   Diabetes mellitus without complication (HCC)    gestational, no issues since pregnancy per her report - resolved   Gestational diabetes    /no meds now - resolved   Hepatitis    Hep B, managed by  Dr. Kinnie Scales   History of chicken pox    History of suicide attempt 2003   Attempt in college   History of UTI    Vaginal Pap smear, abnormal     Patient Active Problem List   Diagnosis Date Noted   Extraperitoneal bladder perforation 05/17/2020   History of recurrent UTI (urinary tract infection) 05/08/2020   Free intraperitoneal air 05/08/2020   Anxiety    Abnormal CT of the abdomen    Hepatic steatosis 05/07/2020   Pneumoperitoneum 05/07/2020   Foley catheter in place 05/07/2020   Anal fissure 05/07/2020   Change in bowel habit 05/07/2020   Periumbilical pain 05/07/2020   Rectal bleeding 05/07/2020   History of gestational diabetes 05/07/2020   History of depression 05/07/2020   Portal hypertension (HCC)    History of urinary retention 04/15/2020   Chronic abdominal pain 04/13/2020   Narcolepsy without cataplexy 02/02/2020   Depression, major, single episode, moderate (HCC) 10/28/2018   Elevated CA-125 08/06/2018   ARF (acute renal failure) (HCC) 06/25/2018   Alcohol abuse 06/24/2018   Constipation, chronic 06/24/2018   Ascites 06/24/2018   Body mass index (BMI) of 25.0 to 29.9 05/29/2018   Fibrocystic disease of breast 05/29/2018   Metrorrhagia 05/29/2018   Urinary tract infectious disease 05/29/2018   Oral hypoglycemic controlled White classification A2 gestational diabetes mellitus (GDM) 11/18/2017   Previous cesarean delivery, delivered: arrest of dilation  11/18/2017  Abnormal glucose tolerance test (GTT) during pregnancy, antepartum 09/17/2017   Chronic active viral hepatitis B (HCC) 06/14/2014    Past Surgical History:  Procedure Laterality Date   BIOPSY  05/10/2020   Procedure: BIOPSY;  Surgeon: Benancio Deeds, MD;  Location: [facility];  Service: Gastroenterology;;   BLADDER REPAIR N/A 05/17/2020   Procedure: EXPLORATORY LAPAROTOMY WITH CYSTORRHAPHY;  Surgeon: Marcine Matar, MD;  Location: [facility];  Service: Urology;  Laterality: N/A;   CESAREAN  SECTION N/A 01/26/2013   Procedure: Primary Cesarean Section Delivery Baby  Boy @ 0017, Apgars 4/8/9;  Surgeon: Freddrick March. Tenny Craw, MD;  Location: [facility];  Service: Obstetrics;  Laterality: N/A;   CESAREAN SECTION N/A 11/18/2017   Procedure: CESAREAN SECTION;  Surgeon: Olivia Mackie, MD;  Location: [facility];  Service: Obstetrics;  Laterality: N/A;   COLONOSCOPY  2020   DIAGNOSTIC LAPAROSCOPIC LIVER BIOPSY N/A 05/07/2020   Procedure: DIAGNOSTIC LAPAROSCOPIC;  Surgeon: Karie Soda, MD;  Location: [facility];  Service: General;  Laterality: N/A;   DIAGNOSTIC LAPAROSCOPY  08/25/2018   Dr Clifton James with Ob/Gyn3/2020   ESOPHAGOGASTRODUODENOSCOPY (EGD) WITH PROPOFOL N/A 05/10/2020   Procedure: ESOPHAGOGASTRODUODENOSCOPY (EGD) WITH PROPOFOL;  Surgeon: Benancio Deeds, MD;  Location: [facility];  Service: Gastroenterology;  Laterality: N/A;   ESOPHAGOGASTRODUODENOSCOPY ENDOSCOPY     IR TRANSCATHETER BX  04/18/2020   IR US GUIDE VASC ACCESS RIGHT  04/18/2020   IR VENOGRAM HEPATIC W HEMODYNAMIC EVALUATION  04/18/2020   NASAL SINUS SURGERY  1996   WISDOM TOOTH EXTRACTION       OB History     Gravida  7   Para  2   Term  2   Preterm      AB  5   Living  2      SAB  0   IAB  5   Ectopic      Multiple  0   Live Births  2           Family History  Adopted: Yes  Family history unknown: Yes    Social History   Tobacco Use   Smoking status: Former    Packs/day: 1.00    Types: Cigarettes    Quit date: 07/23/2011    Years since quitting: 9.5   Smokeless tobacco: Never   Tobacco comments:    smoked for 8 years, quit in 2013  Vaping Use   Vaping Use: Never used  Substance Use Topics   Alcohol use: Not Currently    Alcohol/week: 5.0 standard drinks    Types: 5 Glasses of wine per week    Comment: h/o binge drinking - more abstinent 2021-   Drug use: Not Currently    Types: Marijuana    Comment: Last use 12/19/18    Home Medications Prior to Admission  medications   Medication Sig Start Date End Date Taking? Authorizing Provider  albuterol (VENTOLIN HFA) 108 (90 Base) MCG/ACT inhaler Inhale 2 puffs into the lungs every 4 (four) hours as needed for wheezing or shortness of breath. 01/30/21   Burchette, Elberta Fortis, MD  cetirizine (ZYRTEC ALLERGY) 10 MG tablet Take 1 tablet (10 mg total) by mouth daily. 01/05/21   Roxy Horseman, PA-C  magic mouthwash w/lidocaine SOLN Take 5 mLs by mouth 3 (three) times daily as needed for mouth pain (gargle and spit). 01/24/21   Nafziger, Kandee Keen, NP  ondansetron (ZOFRAN) 4 MG tablet Take 1 tablet (4 mg total) by mouth every 8 (eight) hours as needed  for nausea or vomiting. 01/24/21   Nafziger, Kandee Keenory, NP  tenofovir (VIREAD) 300 MG tablet Take 300 mg by mouth daily.    [provider]    Allergies    Contrast media [iodinated diagnostic agents], Nsaids, and Prednisone & diphenhydramine  Review of Systems   Review of Systems  Constitutional:  Positive for chills. Negative for activity change, diaphoresis and fever.  Respiratory:  Positive for shortness of breath. Negative for cough and wheezing.   Cardiovascular:  Negative for chest pain and palpitations.  Gastrointestinal:  Positive for abdominal pain and constipation. Negative for abdominal distention, blood in stool, diarrhea, nausea and vomiting.  Genitourinary:  Positive for difficulty urinating. Negative for decreased urine volume, dysuria, flank pain, frequency, hematuria, pelvic pain, urgency, vaginal bleeding, vaginal discharge and vaginal pain.  Musculoskeletal:  Negative for back pain.  Skin:  Negative for rash.  Allergic/Immunologic: Negative for immunocompromised state.  Neurological:  Negative for seizures, syncope, weakness, light-headedness, numbness and headaches.  Psychiatric/Behavioral:  Negative for confusion.    Physical Exam Updated Vital Signs BP (!) 99/56   Pulse 68   Temp 98 F (36.7 C) (Oral)   Resp 18   SpO2 100%   Physical  Exam Vitals and nursing note reviewed.  Constitutional:      General: She is not in acute distress.    Appearance: She is not ill-appearing, toxic-appearing or diaphoretic.  HENT:     Head: Normocephalic.  Eyes:     Conjunctiva/sclera: Conjunctivae normal.  Cardiovascular:     Rate and Rhythm: Normal rate and regular rhythm.     Pulses: Normal pulses.     Heart sounds: Normal heart sounds. No murmur heard.   No friction rub. No gallop.  Pulmonary:     Effort: Pulmonary effort is normal. No respiratory distress.     Breath sounds: No stridor. No wheezing, rhonchi or rales.  Chest:     Chest wall: No tenderness.  Abdominal:     General: There is no distension.     Palpations: Abdomen is soft. There is no mass.     Tenderness: There is abdominal tenderness. There is no right CVA tenderness, left CVA tenderness, guarding or rebound.     Hernia: No hernia is present.     Comments: Tender to palpation in the bilateral lower abdomen.  No rebound, but there is guarding.  Abdomen is soft and mildly protuberant, but not distended.  Hyperactive bowel sounds in all 4 quadrants.  No CVA tenderness.  No tenderness over McBurney's point.  Negative Murphy sign.  Musculoskeletal:        General: No tenderness.     Cervical back: Neck supple.     Right lower leg: No edema.     Left lower leg: No edema.  Skin:    General: Skin is warm.     Capillary Refill: Capillary refill takes less than 2 seconds.     Findings: No rash.  Neurological:     General: No focal deficit present.     Mental Status: She is alert.  Psychiatric:        Behavior: Behavior normal.    ED Results / Procedures / Treatments   Labs (all labs ordered are listed, but only abnormal results are displayed) Labs Reviewed  COMPREHENSIVE METABOLIC PANEL - Abnormal; Notable for the following components:      Result Value   Glucose, Bld 118 (*)    Calcium 8.7 (*)    Alkaline Phosphatase 36 (*)  All other components within  normal limits  CBC WITH DIFFERENTIAL/PLATELET  URINALYSIS, ROUTINE W REFLEX MICROSCOPIC  LIPASE, BLOOD  I-STAT BETA HCG BLOOD, ED ([facility], [facility], [facility] ONLY)    EKG None  Radiology CT ABDOMEN PELVIS WO CONTRAST  Result Date: 02/13/2021 CLINICAL DATA:  35 year old female who woke with abdominal pain. Chronic hepatitis-B. Status post intraperitoneal bladder rupture last year with subsequent exploratory laparotomy and bladder repair in December. EXAM: CT ABDOMEN AND PELVIS WITHOUT CONTRAST TECHNIQUE: Multidetector CT imaging of the abdomen and pelvis was performed following the standard protocol without IV contrast. COMPARISON:  CT Abdomen and Pelvis 05/07/2020 and earlier. FINDINGS: Lower chest: Negative. Hepatobiliary: Partially contracted gallbladder today. Negative noncontrast liver. Pancreas: Negative. Spleen: Negative.  No splenomegaly. Adrenals/Urinary Tract: Normal adrenal glands. Noncontrast kidneys appear stable and nonobstructed. No nephrolithiasis or pararenal inflammation. Both ureters appear decompressed. Somewhat diminutive urinary bladder with acute versus chronic soft tissue stranding in the space of Retzius, contiguous with the anterior bladder wall and bladder dome (series 2, image 68 and sagittal image 74). But no other perivesical stranding. No perivesical fluid. Occasional pelvic phleboliths are stable. Stomach/Bowel: Negative large bowel aside from retained stool throughout and some redundancy. Normal appendix on series 2, image 54. Terminal ileum is within normal limits. No dilated small bowel. Stomach and duodenum appear negative. No free air or free fluid. Vascular/Lymphatic: Normal caliber abdominal aorta. No lymphadenopathy. Reproductive: Noncontrast appearance is within normal limits. Other: No pelvic free fluid today. Musculoskeletal: Lower thoracic posterior element hypertrophy and calcification at T11-T12 greater on the left. No acute osseous abnormality identified. IMPRESSION: 1.  Probable postoperative soft tissue stranding in the Space of Retzius anterior to the bladder. No obstructive uropathy or other urinary abnormality on this non-contrast exam. 2. No other acute or inflammatory process. Normal appendix. Retained stool in the colon. Electronically Signed   By: Odessa Fleming M.D.   On: 02/13/2021 05:54    Procedures Procedures   Medications Ordered in ED Medications  morphine 4 MG/ML injection 4 mg (4 mg Intravenous Given 02/13/21 0543)  ondansetron (ZOFRAN) injection 4 mg (4 mg Intravenous Given 02/13/21 0542)  metoCLOPramide (REGLAN) injection 10 mg (10 mg Intravenous Given 02/13/21 6294)    ED Course  I have reviewed the triage vital signs and the nursing notes.  Pertinent labs & imaging results that were available during my care of the patient were reviewed by me and considered in my medical decision making (see chart for details).    MDM Rules/Calculators/A&P                            35 year old female with a history of intraperitoneal bladder rupture s/p laparotomy with cystorrhaphy, UTI, constipation, diabetes mellitus, anxiety who presents the emergency department with a chief complaint of lower abdominal pain.  Patient states that the pain feels similar to previous bladder rupture.  Vital signs are stable.  On exam, she is tender to palpation in the bilateral lower abdomen.  Abdomen is not distended.  There is guarding but no rebound.  Pregnancy test is negative.  No metabolic derangements.  CBC is unremarkable.  Urinalysis is pending.  CT with probable postoperative tissue stranding in the space of Retzius anterior to the bladder.  No other urinary abnormalities or obstructive uropathy, but CT is limited due to patient's history of anaphylaxis with iodinated contrast.  Appendix is normal.  She does have some retained stool in the colon concerning for constipation.  Discussed the patient with Dr. Read Drivers, attending physician.  Will check urinalysis.  Likely  plan to touch base with urology to discuss further work-up and plan as patient may benefit from renal ultrasound.  Patient care transferred to PA Upstill at the end of my shift. Patient presentation, ED course, and plan of care discussed with review of all pertinent labs and imaging. Please see his/her note for further details regarding further ED course and disposition.   Final Clinical Impression(s) / ED Diagnoses Final diagnoses:  None    Rx / DC Orders ED Discharge Orders     None        Aveline Daus A, PA-C 02/13/21 0700    Molpus, Jonny Ruiz, MD 02/13/21 [phone]

## 2021-02-13 NOTE — ED Triage Notes (Signed)
Pt states that she woke up this morning with abdominal pain. Pt states that the last time she had this pain, she had a hole in her bladder. Pt reports pain 8/10 across her lower abdomen.

## 2021-02-13 NOTE — ED Notes (Signed)
Pt ambulated to the restroom gait steady with no assistance 

## 2021-02-13 NOTE — ED Provider Notes (Signed)
7:00 - INtroduction and re-exam: Patient signed out by Frederik Pear, PA-C, at end of shift. Here for evaluation of lower abdominal pain that woke her early this morning. H/o bladder rupture (12/21), and symptoms this morning felt similar. CT abd/pel limited by anaphylactic reaction to CM, but shows stranding around the bladder.   Pending UA, urology consult for close follow up.   The patient is mildly tender. She denies vaginal bleeding, dyspareunia, vaginal discharge, hematuria. Last BM just prior to arrival.   UA negative for infection. She is resting comfortably. Will run it by urology for further recommendation, likely close in-office follow up.   9:00 - discussed with Dr. Marlou Porch. From urology standpoint, stranding felt reassuring.   BP fell to 84 systolic, no tachycardia. No fever. Looks like her baseline is around 100. IVF's ordered with improvement.   Discussed CT scan, labs, discussion with urology and recommended office follow up with the patient. Offered pelvic exam - she does not feel there is a pelvic infection. She feels she is having regular normal BM's and does not feel constipated.   Discussed discharge home and strict return precautions. She will call urology today to schedule a time to be seen this week.     Elpidio Anis, PA-C 02/13/21 0933    Molpus, Jonny Ruiz, MD 02/13/21 1004

## 2021-02-13 NOTE — Discharge Instructions (Addendum)
A cause for your pain has not been identified but your exam and CT scan are reassuring and no infection is found. There is no indication that you have a recurrent bladder rupture either.   You can be discharged home and are encouraged to follow up closely in office. Call [facility] this morning to schedule an outpatient visit this week.   If you have severe/worsening pain, fever, vomiting or new symptoms of concern, have a very low threshold to return to the ED for further assessment.

## 2021-02-15 DIAGNOSIS — R3982 Chronic bladder pain: Secondary | ICD-10-CM | POA: Diagnosis not present

## 2021-02-28 DIAGNOSIS — K7400 Hepatic fibrosis, unspecified: Secondary | ICD-10-CM | POA: Diagnosis not present

## 2021-02-28 DIAGNOSIS — K760 Fatty (change of) liver, not elsewhere classified: Secondary | ICD-10-CM | POA: Diagnosis not present

## 2021-02-28 DIAGNOSIS — F1010 Alcohol abuse, uncomplicated: Secondary | ICD-10-CM | POA: Diagnosis not present

## 2021-02-28 DIAGNOSIS — B181 Chronic viral hepatitis B without delta-agent: Secondary | ICD-10-CM | POA: Diagnosis not present

## 2021-03-08 ENCOUNTER — Encounter: Payer: Self-pay | Admitting: Family Medicine

## 2021-03-09 ENCOUNTER — Encounter: Payer: Self-pay | Admitting: Family Medicine

## 2021-03-09 ENCOUNTER — Ambulatory Visit: Payer: BC Managed Care – PPO | Admitting: Family Medicine

## 2021-03-09 ENCOUNTER — Other Ambulatory Visit: Payer: Self-pay

## 2021-03-09 VITALS — BP 110/80 | HR 63 | Temp 98.0°F | Ht 59.0 in | Wt 129.8 lb

## 2021-03-09 DIAGNOSIS — M25562 Pain in left knee: Secondary | ICD-10-CM

## 2021-03-09 DIAGNOSIS — M25571 Pain in right ankle and joints of right foot: Secondary | ICD-10-CM

## 2021-03-09 NOTE — Progress Notes (Signed)
  Kim Pacheco DOB: May 14, 1986 Encounter date: 03/09/2021  This is a 35 y.o. female who presents with No chief complaint on file.   History of present illness: Started hurting about a week ago - notes more if putting extra weight on it - like going up and down stairs or if bent for a long time and then she unbends it. No swelling. Just left knee. No history of knee injury. Not taken or done anything for knee. Was having issues with right ankle last year and still has some issues - just feels really tight. Didn't hurt right ankle that she recalls. No swelling. She wasn't able to follow up for ankle due to all of bladder issues/rupture that occurred.   Abdominal pain is better. She feels some of pain is related to constipation.    Allergies  Allergen Reactions   Contrast Media [Iodinated Diagnostic Agents] Anaphylaxis, Shortness Of Breath and Rash    IV contrast    Nsaids Other (See Comments)    Hx gastritis & gastric perforation    Prednisone & Diphenhydramine Other (See Comments)    Don't like to take it   No outpatient medications have been marked as taking for the 03/09/21 encounter (Appointment) with Wynn Banker, MD.    Review of Systems  Constitutional:  Negative for chills, fatigue and fever.  Respiratory:  Negative for cough, chest tightness, shortness of breath and wheezing.   Cardiovascular:  Negative for chest pain, palpitations and leg swelling.  Musculoskeletal:        Left knee pain, right ankle pain/stiffness    Objective:  There were no vitals taken for this visit.      BP Readings from Last 3 Encounters:  02/13/21 93/79  02/05/21 110/82  01/30/21 124/70   Wt Readings from Last 3 Encounters:  02/05/21 127 lb 9.6 oz (57.9 kg)  01/30/21 127 lb (57.6 kg)  01/24/21 125 lb (56.7 kg)    Physical Exam Vitals reviewed.  Musculoskeletal:     Comments: Popping right knee with flexion/extension, no pain; crepitance with palpation patella left  knee; mild with flexion. No edema. Suprapatellar tenderness to palpation. Tenderness IT band. No instability appreciated or laxity.   Right ankle normal rom. No pain reproduced with palpation. No noted laxity.   Neurological:     Mental Status: She is alert.    Assessment/Plan 1. Pain in right ankle and joints of right foot She wasn't able to complete therapy for ankle. Referring today and can follow up with sports med.  - Ambulatory referral to Physical Therapy  2. Acute pain of left knee Discussed patellafemoral syndrome. She started running about a week ago. Ice after activity, discussed stretches. Will have her follow up with PT as suspect some of knee issues related to change in walking secondary to right ankle pain.  - Ambulatory referral to Physical Therapy  Return if symptoms worsen or fail to improve.       Theodis Shove, MD

## 2021-03-28 ENCOUNTER — Encounter: Payer: Self-pay | Admitting: Physical Therapy

## 2021-03-28 ENCOUNTER — Ambulatory Visit: Payer: BC Managed Care – PPO | Attending: Family Medicine | Admitting: Physical Therapy

## 2021-03-28 ENCOUNTER — Other Ambulatory Visit: Payer: Self-pay

## 2021-03-28 DIAGNOSIS — M25571 Pain in right ankle and joints of right foot: Secondary | ICD-10-CM | POA: Insufficient documentation

## 2021-03-28 DIAGNOSIS — R262 Difficulty in walking, not elsewhere classified: Secondary | ICD-10-CM | POA: Diagnosis not present

## 2021-03-28 DIAGNOSIS — M25562 Pain in left knee: Secondary | ICD-10-CM | POA: Insufficient documentation

## 2021-03-28 DIAGNOSIS — M25671 Stiffness of right ankle, not elsewhere classified: Secondary | ICD-10-CM | POA: Insufficient documentation

## 2021-03-28 DIAGNOSIS — M25371 Other instability, right ankle: Secondary | ICD-10-CM | POA: Insufficient documentation

## 2021-03-28 NOTE — Patient Instructions (Signed)
Access Code: [email or web address] URL: [email or web address] Date: 03/28/2021 Prepared by: Loistine Simas Tymara Saur  Exercises Gastroc Stretch on Wall - 1 x daily - 7 x weekly - 1 sets - 3 reps - 30 hold Soleus Stretch on Wall - 1 x daily - 7 x weekly - 1 sets - 3 reps - 30 hold Single Leg Stance - 1 x daily - 7 x weekly - 1 sets - 5 reps - 30 sec hold Single Leg Heel Raise with Counter Support - 1 x daily - 7 x weekly - 3 sets - 5 reps Seated Ankle Eversion with Resistance - 1 x daily - 7 x weekly - 3 sets - 10 reps

## 2021-03-28 NOTE — Therapy (Signed)
[facility] [address] [place], [place], [address] Phone: [phone]   Fax:  [phone]  Physical Therapy Evaluation  Patient Details  Name: Kim Pacheco MRN: [id number] Date of Birth: 03-Nov-1985 Referring Provider (PT): Wynn Banker, MD   Encounter Date: 03/28/2021   PT End of Session - 03/28/21 1156     Visit Number 1    Date for PT Re-Evaluation 05/09/21    Authorization Type BCBS    Authorization - Visit Number 1    Authorization - Number of Visits 30    Progress Note Due on Visit 10    PT Start Time 1125    PT Stop Time 1155    PT Time Calculation (min) 30 min    Activity Tolerance Patient tolerated treatment well    Behavior During Therapy [facility] for tasks assessed/performed             Past Medical History:  Diagnosis Date   Abdominal pain    started in mid abd and spread to left side/on and off/since Jan 2020/ pt states, she has fluid in abd/unknown   Abnormal Pap smear    normal 06/2013   Anxiety    Constipation    hx of   Depression    suicide attempt in college   Diabetes mellitus without complication (HCC)    gestational, no issues since pregnancy per her report - resolved   Gestational diabetes    /no meds now - resolved   Hepatitis    Hep B, managed by Dr. Kinnie Scales   History of chicken pox    History of suicide attempt 2003   Attempt in college   History of UTI    Vaginal Pap smear, abnormal     Past Surgical History:  Procedure Laterality Date   BIOPSY  05/10/2020   Procedure: BIOPSY;  Surgeon: Benancio Deeds, MD;  Location: [facility];  Service: Gastroenterology;;   BLADDER REPAIR N/A 05/17/2020   Procedure: EXPLORATORY LAPAROTOMY WITH CYSTORRHAPHY;  Surgeon: Marcine Matar, MD;  Location: [facility];  Service: Urology;  Laterality: N/A;   CESAREAN SECTION N/A 01/26/2013   Procedure: Primary Cesarean Section Delivery Baby  Boy @ 0017, Apgars 4/8/9;   Surgeon: Freddrick March. Tenny Craw, MD;  Location: [facility];  Service: Obstetrics;  Laterality: N/A;   CESAREAN SECTION N/A 11/18/2017   Procedure: CESAREAN SECTION;  Surgeon: Olivia Mackie, MD;  Location: [facility];  Service: Obstetrics;  Laterality: N/A;   COLONOSCOPY  2020   DIAGNOSTIC LAPAROSCOPIC LIVER BIOPSY N/A 05/07/2020   Procedure: DIAGNOSTIC LAPAROSCOPIC;  Surgeon: Karie Soda, MD;  Location: [facility];  Service: General;  Laterality: N/A;   DIAGNOSTIC LAPAROSCOPY  08/25/2018   Dr Clifton James with Ob/Gyn3/2020   ESOPHAGOGASTRODUODENOSCOPY (EGD) WITH PROPOFOL N/A 05/10/2020   Procedure: ESOPHAGOGASTRODUODENOSCOPY (EGD) WITH PROPOFOL;  Surgeon: Benancio Deeds, MD;  Location: [facility];  Service: Gastroenterology;  Laterality: N/A;   ESOPHAGOGASTRODUODENOSCOPY ENDOSCOPY     IR TRANSCATHETER BX  04/18/2020   IR US GUIDE VASC ACCESS RIGHT  04/18/2020   IR VENOGRAM HEPATIC W HEMODYNAMIC EVALUATION  04/18/2020   NASAL SINUS SURGERY  1996   WISDOM TOOTH EXTRACTION      There were no vitals filed for this visit.    Subjective Assessment - 03/28/21 1117     Subjective Patient referred for left knee pain and right ankle pain.  Patient states the left knee seems to be resolving and her primary concern  is right ankle stiffness.  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  She is also limited in ascending and descending stairs.    Limitations Other (comment)   Deep squat and climbing stairs   Currently in Pain? 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  No    Is the patient reluctant to leave their home because of a fear of falling?  No      Prior Function   Vocation Full time employment    Market researcher - deep squatting      Functional Tests   Functional tests Squat      Squat   Comments able to perform deep squat with heels on ground but signif eversion      Posture/Postural Control   Posture/Postural Control Postural limitations    Posture Comments Bilateral pronation      ROM / Strength   AROM / PROM / Strength AROM;Strength   with exception of right plantar flexion     AROM   AROM Assessment Site Ankle    Right/Left Ankle Right;Left    Right Ankle Dorsiflexion 6    Right Ankle Plantar Flexion 60    Right Ankle Inversion 35    Right Ankle Eversion 12    Left Ankle Dorsiflexion 10    Left Ankle Plantar Flexion 68    Left Ankle Inversion 28    Left Ankle Eversion 20      Strength   Overall Strength Deficits    Overall Strength Comments 5/5 throughout bilateral ankles with exception of right plantar flexion 4/5 and right ankle eversion 4/5    Strength Assessment Site Ankle    Right/Left Ankle Right    Right Ankle Plantar Flexion 4/5    Right Ankle Eversion 4/5      Flexibility   Soft Tissue Assessment /Muscle Length yes   limited Rt gastroc soleus complex approx 25%     Palpation   Spinal mobility hypomobile midfoot on right vs. left and hypomobile  subtalar eversion    Palpation comment right medial gastroc and soleus TTP and with trigger points      Ambulation/Gait   Gait  Pattern Within Functional Limits      Balance   Balance Assessed Yes    Balance comment SLS on right: able to balance but with increased challenge compared to left                        Objective measurements completed on examination: See above findings.       [facility] Adult PT Treatment/Exercise - 03/28/21 0001       Self-Care   Self-Care Other Self-Care Comments    Other Self-Care Comments  initiated HEP for ankle strength and flexibility, balance in SLS                     PT Education - 03/28/21 1145     Education Details Access Code: [email or web address]    Person(s) Educated Patient    Methods Explanation;Demonstration;Handout    Comprehension Verbalized understanding;Returned demonstration              PT Short Term Goals - 03/28/21 1215       PT SHORT TERM GOAL #1   Title Pt to be independent with initial HEP    Time 2    Period Weeks    Status New    Target Date 04/11/21               PT Long Term Goals - 03/28/21 1216       PT LONG TERM GOAL #1   Title Pt to be indepednent with final HEP    Time 6    Period Weeks    Target Date 05/09/21      PT LONG TERM GOAL #2   Title Patient to demonstrate improvement in ankle mobility and ROM to be symmetrical to left ankle to allow for deep squatting    Baseline Patient reports signficant stiffness on a constant basis    Time 6    Period Weeks    Status New    Target Date 05/09/21      PT LONG TERM GOAL #3   Title Patient to report 0-1/10 pain with bending and 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 Hassan Rowan initially for left knee pain and right ankle stiffness and moility issues.  She states the knee is improved and that she would like to focus on the right ankle at this time.  She presents with limited ROM in DF and hypermobility with inversion and plantarflexion.  Strength is limited in right ankle eversion and plantarflexion.  She has some difficulty with SLS on right vs. left. 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    Examination-Activity Limitations Squat  Examination-Participation Restrictions Occupation    Stability/Clinical Decision Making Evolving/Moderate complexity    Clinical Decision Making Moderate    Rehab Potential Good    PT Frequency 1x / week    PT Duration 6 weeks    PT Treatment/Interventions ADLs/Self Care Home Management;Aquatic Therapy;Cryotherapy;Ultrasound;Iontophoresis 4mg /ml Dexamethasone;Electrical Stimulation;Gait training;Stair training;Functional mobility training;Neuromuscular re-education;Balance training;Therapeutic exercise;Therapeutic activities;Patient/family education;Manual techniques;Passive range of motion;Taping;Joint Manipulations;Vasopneumatic Device    PT Next Visit Plan Assess compliance and effectiveness of HEP, progres ankle stability and ROM exercises    Consulted and Agree with Plan of Care Patient             Patient will benefit from skilled therapeutic intervention in order to improve the following deficits and impairments:  Decreased mobility, Difficulty walking, Hypomobility, Decreased range of motion, Decreased strength, Impaired flexibility  Visit Diagnosis: Stiffness of right ankle, not elsewhere  classified - Plan: PT plan of care cert/re-cert  Ankle instability, right - Plan: PT plan of care cert/re-cert  Difficulty in walking, not elsewhere classified - Plan: PT plan of care cert/re-cert     Problem List Patient Active Problem List   Diagnosis Date Noted   Extraperitoneal bladder perforation 05/17/2020   History of recurrent UTI (urinary tract infection) 05/08/2020   Free intraperitoneal air 05/08/2020   Anxiety    Abnormal CT of the abdomen    Hepatic steatosis 05/07/2020   Pneumoperitoneum 05/07/2020   Foley catheter in place 05/07/2020   Anal fissure 05/07/2020   Change in bowel habit 05/07/2020   Periumbilical pain 05/07/2020   Rectal bleeding 05/07/2020   History of gestational diabetes 05/07/2020   History of depression 05/07/2020   Portal hypertension (HCC)    History of urinary retention 04/15/2020   Chronic abdominal pain 04/13/2020   Narcolepsy without cataplexy 02/02/2020   Depression, major, single episode, moderate (HCC) 10/28/2018   Elevated CA-125 08/06/2018   ARF (acute renal failure) (HCC) 06/25/2018   Alcohol abuse 06/24/2018   Constipation, chronic 06/24/2018   Ascites 06/24/2018   Body mass index (BMI) of 25.0 to 29.9 05/29/2018   Fibrocystic disease of breast 05/29/2018   Metrorrhagia 05/29/2018   Urinary tract infectious disease 05/29/2018   Oral hypoglycemic controlled White classification A2 gestational diabetes mellitus (GDM) 11/18/2017   Previous cesarean delivery, delivered: arrest of dilation  11/18/2017   Abnormal glucose tolerance test (GTT) during pregnancy, antepartum 09/17/2017   Chronic active viral hepatitis B (HCC) 06/14/2014    Demitrios Molyneux B. Travas Schexnayder, PT 10/19/221:18 PM   [facility] @ [facility] [address] [place], [place], [place] Phone: [phone]   Fax:  [phone]  Name: Kim Pacheco MRN: Juluis Mire Date of Birth: 1986/05/20

## 2021-04-04 ENCOUNTER — Other Ambulatory Visit: Payer: Self-pay

## 2021-04-04 ENCOUNTER — Ambulatory Visit: Payer: BC Managed Care – PPO | Admitting: Rehabilitative and Restorative Service Providers"

## 2021-04-04 ENCOUNTER — Encounter: Payer: Self-pay | Admitting: Rehabilitative and Restorative Service Providers"

## 2021-04-04 DIAGNOSIS — M25371 Other instability, right ankle: Secondary | ICD-10-CM

## 2021-04-04 DIAGNOSIS — R262 Difficulty in walking, not elsewhere classified: Secondary | ICD-10-CM

## 2021-04-04 DIAGNOSIS — M25571 Pain in right ankle and joints of right foot: Secondary | ICD-10-CM | POA: Diagnosis not present

## 2021-04-04 DIAGNOSIS — M25562 Pain in left knee: Secondary | ICD-10-CM | POA: Diagnosis not present

## 2021-04-04 DIAGNOSIS — M25671 Stiffness of right ankle, not elsewhere classified: Secondary | ICD-10-CM | POA: Diagnosis not present

## 2021-04-04 NOTE — Therapy (Signed)
[facility] @ [facility] [address] [place], [place], [address] Phone: [phone]   Fax:  [phone]  Physical Therapy Treatment  Patient Details  Name: Kim Pacheco MRN: [id number] Date of Birth: Jun 16, 1985 Referring Provider (PT): Wynn Banker, MD   Encounter Date: 04/04/2021   PT End of Session - 04/04/21 1021     Visit Number 2    Date for PT Re-Evaluation 05/09/21    Authorization Type BCBS    Authorization - Visit Number 2    Authorization - Number of Visits 30    Progress Note Due on Visit 10    PT Start Time 1019    PT Stop Time 1057    PT Time Calculation (min) 38 min    Activity Tolerance Patient tolerated treatment well    Behavior During Therapy [facility] for tasks assessed/performed             Past Medical History:  Diagnosis Date   Abdominal pain    started in mid abd and spread to left side/on and off/since Jan 2020/ pt states, she has fluid in abd/unknown   Abnormal Pap smear    normal 06/2013   Anxiety    Constipation    hx of   Depression    suicide attempt in college   Diabetes mellitus without complication (HCC)    gestational, no issues since pregnancy per her report - resolved   Gestational diabetes    /no meds now - resolved   Hepatitis    Hep B, managed by Dr. Kinnie Scales   History of chicken pox    History of suicide attempt 2003   Attempt in college   History of UTI    Vaginal Pap smear, abnormal     Past Surgical History:  Procedure Laterality Date   BIOPSY  05/10/2020   Procedure: BIOPSY;  Surgeon: Benancio Deeds, MD;  Location: [facility];  Service: Gastroenterology;;   BLADDER REPAIR N/A 05/17/2020   Procedure: EXPLORATORY LAPAROTOMY WITH CYSTORRHAPHY;  Surgeon: Marcine Matar, MD;  Location: [facility];  Service: Urology;  Laterality: N/A;   CESAREAN SECTION N/A 01/26/2013   Procedure: Primary Cesarean Section Delivery Baby  Boy @ 0017, Apgars 4/8/9;  Surgeon:  Freddrick March. Tenny Craw, MD;  Location: [facility];  Service: Obstetrics;  Laterality: N/A;   CESAREAN SECTION N/A 11/18/2017   Procedure: CESAREAN SECTION;  Surgeon: Olivia Mackie, MD;  Location: [facility];  Service: Obstetrics;  Laterality: N/A;   COLONOSCOPY  2020   DIAGNOSTIC LAPAROSCOPIC LIVER BIOPSY N/A 05/07/2020   Procedure: DIAGNOSTIC LAPAROSCOPIC;  Surgeon: Karie Soda, MD;  Location: [facility];  Service: General;  Laterality: N/A;   DIAGNOSTIC LAPAROSCOPY  08/25/2018   Dr Clifton James with Ob/Gyn3/2020   ESOPHAGOGASTRODUODENOSCOPY (EGD) WITH PROPOFOL N/A 05/10/2020   Procedure: ESOPHAGOGASTRODUODENOSCOPY (EGD) WITH PROPOFOL;  Surgeon: Benancio Deeds, MD;  Location: [facility];  Service: Gastroenterology;  Laterality: N/A;   ESOPHAGOGASTRODUODENOSCOPY ENDOSCOPY     IR TRANSCATHETER BX  04/18/2020   IR US GUIDE VASC ACCESS RIGHT  04/18/2020   IR VENOGRAM HEPATIC W HEMODYNAMIC EVALUATION  04/18/2020   NASAL SINUS SURGERY  1996   WISDOM TOOTH EXTRACTION      There were no vitals filed for this visit.   Subjective Assessment - 04/04/21 1020     Subjective I worked out yesterday and I felt a little tight after.    Currently in Pain? Yes    Pain Score 6  Pain Location Ankle    Pain Orientation Right    Pain Descriptors / Indicators Tightness    Pain Type Acute pain                               [facility] Adult PT Treatment/Exercise - 04/04/21 0001       Exercises   Exercises Ankle      Manual Therapy   Manual Therapy Soft tissue mobilization;Myofascial release    Manual therapy comments in sitting    Soft tissue mobilization to plantar surface of foot and calf    Myofascial Release manual trigger point to plantar fascia and gastroc      Ankle Exercises: Aerobic   Recumbent Bike L1.0 x6 min with PT present to provide discussion about progress      Ankle Exercises: Machines for Strengthening   Cybex Leg Press 90# 2x10, RLE only 30# 2x10      Ankle  Exercises: Standing   Rocker Board 2 minutes    Rocker Board Limitations 2 min fwd/back, 2 min lateral R/L    Heel Raises Both;20 reps;1 second    Toe Raise 20 reps;1 second      Ankle Exercises: Seated   ABC's 1 rep    Ankle Circles/Pumps AROM;Right;20 reps    Towel Crunch Other (comment)   x20   Other Seated Ankle Exercises 4 way theraband:  2x10 yellow tband                       PT Short Term Goals - 04/04/21 1105       PT SHORT TERM GOAL #1   Title Pt to be independent with initial HEP    Status Achieved               PT Long Term Goals - 04/04/21 1105       PT LONG TERM GOAL #1   Title Pt to be indepednent with final HEP    Status On-going      PT LONG TERM GOAL #2   Title Patient to demonstrate improvement in ankle mobility and ROM to be symmetrical to left ankle to allow for deep squatting    Status On-going      PT LONG TERM GOAL #3   Title 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  Started session with recumbent bike to loosen up ankle, then ended with manual therapy secondary to pt complaints of stiffness following working out yesterday. Pt reports that she has been performing her HEP and can tell her ankle is some better.    PT Treatment/Interventions ADLs/Self Care Home Management;Aquatic Therapy;Cryotherapy;Ultrasound;Iontophoresis 4mg /ml Dexamethasone;Electrical Stimulation;Gait training;Stair training;Functional mobility training;Neuromuscular re-education;Balance training;Therapeutic exercise;Therapeutic activities;Patient/family education;Manual techniques;Passive range of motion;Taping;Joint  Manipulations;Vasopneumatic Device    PT Next Visit Plan Assess compliance and effectiveness of HEP, progres ankle stability and ROM exercises    Consulted and Agree with Plan of Care Patient  Patient will benefit from skilled therapeutic intervention in order to improve the following deficits and impairments:  Decreased mobility, Difficulty walking, Hypomobility, Decreased range of motion, Decreased strength, Impaired flexibility  Visit Diagnosis: Stiffness of right ankle, not elsewhere classified  Ankle instability, right  Difficulty in walking, not elsewhere classified     Problem List Patient Active Problem List   Diagnosis Date Noted   Extraperitoneal bladder perforation 05/17/2020   History of recurrent UTI (urinary tract infection) 05/08/2020   Free intraperitoneal air 05/08/2020   Anxiety    Abnormal CT of the abdomen    Hepatic steatosis 05/07/2020   Pneumoperitoneum 05/07/2020   Foley catheter in place 05/07/2020   Anal fissure 05/07/2020   Change in bowel habit 05/07/2020   Periumbilical pain 05/07/2020   Rectal bleeding 05/07/2020   History of gestational diabetes 05/07/2020   History of depression 05/07/2020   Portal hypertension (HCC)    History of urinary retention 04/15/2020   Chronic abdominal pain 04/13/2020   Narcolepsy without cataplexy 02/02/2020   Depression, major, single episode, moderate (HCC) 10/28/2018   Elevated CA-125 08/06/2018   ARF (acute renal failure) (HCC) 06/25/2018   Alcohol abuse 06/24/2018   Constipation, chronic 06/24/2018   Ascites 06/24/2018   Body mass index (BMI) of 25.0 to 29.9 05/29/2018   Fibrocystic disease of breast 05/29/2018   Metrorrhagia 05/29/2018   Urinary tract infectious disease 05/29/2018   Oral hypoglycemic controlled White classification A2 gestational diabetes mellitus (GDM) 11/18/2017   Previous cesarean delivery, delivered: arrest of dilation  11/18/2017   Abnormal glucose tolerance test  (GTT) during pregnancy, antepartum 09/17/2017   Chronic active viral hepatitis B (HCC) 06/14/2014    Reather Laurence, PT, DPT 04/04/2021, 11:07 AM  [facility] @ [facility] [address] [place], [place], [address] Phone: [phone]   Fax:  [phone]  Name: Kim Pacheco MRN: [id number] Date of Birth: July 19, 1985

## 2021-04-10 ENCOUNTER — Ambulatory Visit: Payer: BC Managed Care – PPO | Attending: Family Medicine | Admitting: Physical Therapy

## 2021-04-10 ENCOUNTER — Other Ambulatory Visit: Payer: Self-pay

## 2021-04-10 ENCOUNTER — Encounter: Payer: Self-pay | Admitting: Physical Therapy

## 2021-04-10 DIAGNOSIS — R262 Difficulty in walking, not elsewhere classified: Secondary | ICD-10-CM | POA: Diagnosis not present

## 2021-04-10 DIAGNOSIS — M25371 Other instability, right ankle: Secondary | ICD-10-CM | POA: Insufficient documentation

## 2021-04-10 DIAGNOSIS — R102 Pelvic and perineal pain: Secondary | ICD-10-CM | POA: Diagnosis not present

## 2021-04-10 DIAGNOSIS — M25671 Stiffness of right ankle, not elsewhere classified: Secondary | ICD-10-CM | POA: Insufficient documentation

## 2021-04-10 NOTE — Therapy (Signed)
[facility] @ [facility] [address] [place], [place], [address] Phone: [phone]   Fax:  [phone]  Physical Therapy Treatment  Patient Details  Name: SAMYIA TARAN MRN: [id number] Date of Birth: July 17, 1985 Referring Provider (PT): Wynn Banker, MD   Encounter Date: 04/10/2021   PT End of Session - 04/10/21 1026     Visit Number 3    Date for PT Re-Evaluation 05/09/21    Authorization Type BCBS    Authorization - Visit Number 3    Authorization - Number of Visits 30    Progress Note Due on Visit 10    PT Start Time 1022    PT Stop Time 1101    PT Time Calculation (min) 39 min    Activity Tolerance Patient tolerated treatment well    Behavior During Therapy [facility] for tasks assessed/performed             Past Medical History:  Diagnosis Date   Abdominal pain    started in mid abd and spread to left side/on and off/since Jan 2020/ pt states, she has fluid in abd/unknown   Abnormal Pap smear    normal 06/2013   Anxiety    Constipation    hx of   Depression    suicide attempt in college   Diabetes mellitus without complication (HCC)    gestational, no issues since pregnancy per her report - resolved   Gestational diabetes    /no meds now - resolved   Hepatitis    Hep B, managed by Dr. Kinnie Scales   History of chicken pox    History of suicide attempt 2003   Attempt in college   History of UTI    Vaginal Pap smear, abnormal     Past Surgical History:  Procedure Laterality Date   BIOPSY  05/10/2020   Procedure: BIOPSY;  Surgeon: Benancio Deeds, MD;  Location: [facility];  Service: Gastroenterology;;   BLADDER REPAIR N/A 05/17/2020   Procedure: EXPLORATORY LAPAROTOMY WITH CYSTORRHAPHY;  Surgeon: Marcine Matar, MD;  Location: [facility];  Service: Urology;  Laterality: N/A;   CESAREAN SECTION N/A 01/26/2013   Procedure: Primary Cesarean Section Delivery Baby  Boy @ 0017, Apgars 4/8/9;  Surgeon:  Freddrick March. Tenny Craw, MD;  Location: [facility];  Service: Obstetrics;  Laterality: N/A;   CESAREAN SECTION N/A 11/18/2017   Procedure: CESAREAN SECTION;  Surgeon: Olivia Mackie, MD;  Location: [facility];  Service: Obstetrics;  Laterality: N/A;   COLONOSCOPY  2020   DIAGNOSTIC LAPAROSCOPIC LIVER BIOPSY N/A 05/07/2020   Procedure: DIAGNOSTIC LAPAROSCOPIC;  Surgeon: Karie Soda, MD;  Location: [facility];  Service: General;  Laterality: N/A;   DIAGNOSTIC LAPAROSCOPY  08/25/2018   Dr Clifton James with Ob/Gyn3/2020   ESOPHAGOGASTRODUODENOSCOPY (EGD) WITH PROPOFOL N/A 05/10/2020   Procedure: ESOPHAGOGASTRODUODENOSCOPY (EGD) WITH PROPOFOL;  Surgeon: Benancio Deeds, MD;  Location: [facility];  Service: Gastroenterology;  Laterality: N/A;   ESOPHAGOGASTRODUODENOSCOPY ENDOSCOPY     IR TRANSCATHETER BX  04/18/2020   IR US GUIDE VASC ACCESS RIGHT  04/18/2020   IR VENOGRAM HEPATIC W HEMODYNAMIC EVALUATION  04/18/2020   NASAL SINUS SURGERY  1996   WISDOM TOOTH EXTRACTION      There were no vitals filed for this visit.   Subjective Assessment - 04/10/21 1026     Subjective I continue to feel about a 7/10 level of tightness along the top of my Rt ankle.    Currently in Pain? Yes  Pain Score 7     Pain Location Ankle    Pain Orientation Right    Pain Descriptors / Indicators Tightness    Pain Onset More than a month ago    Pain Frequency Constant    Aggravating Factors  squatting, stairs not as much                               [facility] Adult PT Treatment/Exercise - 04/10/21 0001       Exercises   Exercises Ankle;Knee/Hip      Knee/Hip Exercises: Standing   Functional Squat 10 reps    Functional Squat Limitations red loop band around knees, light bil UE support edge of TM      Manual Therapy   Manual Therapy Soft tissue mobilization;Joint mobilization    Joint Mobilization subtalar eversion Gr III/IV mob with movement into DF and knee flexion, prone    Soft tissue  mobilization medial and lateral gastroc stripping, soleus and post tib stripping, Rt, in prone      Ankle Exercises: Stretches   Plantar Fascia Stretch Limitations Rt x 30 sec    Slant Board Stretch 2 reps;30 seconds   1x30 each for gastroc (knee straight) and soleus (knee bent)     Ankle Exercises: Aerobic   Recumbent Bike L3 x3 min with PT present to provide discussion about progress      Ankle Exercises: Machines for Strengthening   Cybex Leg Press 90lb 1x10 bil LEs, 1x10 30lb Rt LE only      Ankle Exercises: Standing   SLS Rt on foam pad x 1' with decreasing UE support until no UE support throughout minute    Heel Raises Both;15 reps   VC to avoid rolling out, stay over 1st two toes   Other Standing Ankle Exercises step downs Rt LE on step, step down with Lt heel tap floor 1x10 from 4" riser (for functional ankle DF on Rt)      Ankle Exercises: Seated   Other Seated Ankle Exercises Rt ankle eversion 2x10 green loop band around feet, seated on mat table                       PT Short Term Goals - 04/04/21 1105       PT SHORT TERM GOAL #1   Title Pt to be independent with initial HEP    Status Achieved               PT Long Term Goals - 04/04/21 1105       PT LONG TERM GOAL #1   Title Pt to be indepednent with final HEP    Status On-going      PT LONG TERM GOAL #2   Title Patient to demonstrate improvement in ankle mobility and ROM to be symmetrical to left ankle to allow for deep squatting    Status On-going      PT LONG TERM GOAL #3   Title Patient to report 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   She has limited flexibility of soleus, gastroc and posterior tibialis along with limited subtalar eversion needed for proper mechanics with deep squat.  PT performed joint mobs and STM with improved deep squat end of session. Pt has ongoing weakness in ankle DF and improper mechanics with heel raise using supination but this improved with cueing.   PT added functional ankle DF with step downs and functional squats with VC to avoid ankle supination today with good tolerance.  Pt has decreased joint proprioception as seen in SLS x 1' on foam pad on Rt vs Lt.  Pt will continue to benefit from skilled PT to address ankle pain and deficits.    PT Frequency 1x / week    PT Duration 6 weeks    PT Next Visit Plan how did 3 weddings go over weekend, Rt subtalar eversion mobs, step downs and squats for ankle DF, STM Rt calf, stretch Rt calf    PT Home Exercise Plan Access Code: [email or web address]    Consulted and Agree with Plan of Care Patient             Patient will benefit from skilled therapeutic intervention in order to improve the following deficits and impairments:     Visit Diagnosis: Stiffness of right ankle, not elsewhere classified  Ankle instability, right  Difficulty in walking, not elsewhere classified     Problem List Patient Active Problem List   Diagnosis Date Noted   Extraperitoneal bladder perforation 05/17/2020   History of recurrent UTI (urinary tract infection) 05/08/2020   Free intraperitoneal air 05/08/2020   Anxiety    Abnormal CT of the abdomen    Hepatic steatosis 05/07/2020   Pneumoperitoneum 05/07/2020   Foley catheter in place 05/07/2020   Anal fissure 05/07/2020   Change in bowel habit 05/07/2020   Periumbilical pain 05/07/2020   Rectal bleeding 05/07/2020   History of gestational diabetes 05/07/2020   History of depression 05/07/2020   Portal hypertension (HCC)    History of urinary retention 04/15/2020   Chronic abdominal pain 04/13/2020   Narcolepsy  without cataplexy 02/02/2020   Depression, major, single episode, moderate (HCC) 10/28/2018   Elevated CA-125 08/06/2018   ARF (acute renal failure) (HCC) 06/25/2018   Alcohol abuse 06/24/2018   Constipation, chronic 06/24/2018   Ascites 06/24/2018   Body mass index (BMI) of 25.0 to 29.9 05/29/2018   Fibrocystic disease of breast 05/29/2018   Metrorrhagia 05/29/2018   Urinary tract infectious disease 05/29/2018   Oral hypoglycemic controlled White classification A2 gestational diabetes mellitus (GDM) 11/18/2017   Previous cesarean delivery, delivered: arrest of dilation  11/18/2017   Abnormal glucose tolerance test (GTT) during pregnancy, antepartum 09/17/2017   Chronic active viral hepatitis B (HCC) 06/14/2014    Lafayette Dunlevy, PT 04/10/21 1:36 PM   [facility] @ [facility] [address] [place], [place], [address] Phone: [phone]   Fax:  [phone]  Name: BERKLEIGH BECKLES MRN: [id number] Date of Birth: [birth year]

## 2021-04-11 ENCOUNTER — Encounter: Payer: BC Managed Care – PPO | Admitting: Physical Therapy

## 2021-04-18 ENCOUNTER — Other Ambulatory Visit: Payer: Self-pay

## 2021-04-18 ENCOUNTER — Encounter: Payer: Self-pay | Admitting: Physical Therapy

## 2021-04-18 ENCOUNTER — Ambulatory Visit: Payer: BC Managed Care – PPO | Admitting: Physical Therapy

## 2021-04-18 DIAGNOSIS — M25671 Stiffness of right ankle, not elsewhere classified: Secondary | ICD-10-CM

## 2021-04-18 DIAGNOSIS — R262 Difficulty in walking, not elsewhere classified: Secondary | ICD-10-CM

## 2021-04-18 DIAGNOSIS — B181 Chronic viral hepatitis B without delta-agent: Secondary | ICD-10-CM | POA: Diagnosis not present

## 2021-04-18 DIAGNOSIS — M25371 Other instability, right ankle: Secondary | ICD-10-CM

## 2021-04-18 NOTE — Therapy (Signed)
[facility] @ [facility] [place], [place], [address] Phone: [phone]   Fax:  [phone]  Physical Therapy Treatment  Patient Details  Name: Kim Pacheco MRN: [id number] Date of Birth: 03-10-1986 Referring Provider (PT): Caren Macadam, MD   Encounter Date: 04/18/2021   PT End of Session - 04/18/21 1058     Visit Number 4    Date for PT Re-Evaluation 05/09/21    Authorization Type BCBS    Authorization - Visit Number 4    Authorization - Number of Visits 30    Progress Note Due on Visit 10    PT Start Time [id number]    PT Stop Time 1058    PT Time Calculation (min) 43 min    Activity Tolerance Patient tolerated treatment well    Behavior During Therapy [facility] for tasks assessed/performed             Past Medical History:  Diagnosis Date   Abdominal pain    started in mid abd and spread to left side/on and off/since Jan 2020/ pt states, she has fluid in abd/unknown   Abnormal Pap smear    normal 06/2013   Anxiety    Constipation    hx of   Depression    suicide attempt in college   Diabetes mellitus without complication ([place])    gestational, no issues since pregnancy per her report - resolved   Gestational diabetes    /no meds now - resolved   Hepatitis    Hep B, managed by Dr. Earlean Shawl   History of chicken pox    History of suicide attempt 2003   Attempt in college   History of UTI    Vaginal Pap smear, abnormal     Past Surgical History:  Procedure Laterality Date   BIOPSY  05/10/2020   Procedure: BIOPSY;  Surgeon: Yetta Flock, MD;  Location: [facility];  Service: Gastroenterology;;   BLADDER REPAIR N/A 05/17/2020   Procedure: EXPLORATORY LAPAROTOMY WITH CYSTORRHAPHY;  Surgeon: Franchot Gallo, MD;  Location: [facility];  Service: Urology;  Laterality: N/A;   CESAREAN SECTION N/A 01/26/2013   Procedure: Primary Cesarean Section Delivery Baby  Boy @ 0017, Apgars 4/8/9;  Surgeon:  Farrel Gobble. Harrington Challenger, MD;  Location: [facility];  Service: Obstetrics;  Laterality: N/A;   CESAREAN SECTION N/A 11/18/2017   Procedure: CESAREAN SECTION;  Surgeon: Brien Few, MD;  Location: [facility];  Service: Obstetrics;  Laterality: N/A;   COLONOSCOPY  2020   DIAGNOSTIC LAPAROSCOPIC LIVER BIOPSY N/A 05/07/2020   Procedure: DIAGNOSTIC LAPAROSCOPIC;  Surgeon: Michael Boston, MD;  Location: [facility];  Service: General;  Laterality: N/A;   DIAGNOSTIC LAPAROSCOPY  08/25/2018   Dr Polly Cobia with Ob/Gyn3/2020   ESOPHAGOGASTRODUODENOSCOPY (EGD) WITH PROPOFOL N/A 05/10/2020   Procedure: ESOPHAGOGASTRODUODENOSCOPY (EGD) WITH PROPOFOL;  Surgeon: Yetta Flock, MD;  Location: [facility];  Service: Gastroenterology;  Laterality: N/A;   ESOPHAGOGASTRODUODENOSCOPY ENDOSCOPY     IR TRANSCATHETER BX  04/18/2020   IR US GUIDE VASC ACCESS RIGHT  04/18/2020   IR VENOGRAM HEPATIC W HEMODYNAMIC EVALUATION  04/18/2020   NASAL SINUS SURGERY  1996   WISDOM TOOTH EXTRACTION      There were no vitals filed for this visit.   Subjective Assessment - 04/18/21 1022     Subjective I continue to feel stiff in the Rt ankle.  7-9/10 stiffness.  Loosens with PT but then tightens back up.    Currently in  Pain? Yes    Pain Score 9     Pain Location Ankle    Pain Orientation Right    Pain Descriptors / Indicators Tightness    Pain Type Chronic pain    Pain Onset More than a month ago    Pain Frequency Constant    Aggravating Factors  squatting, stairs                               [facility] Adult PT Treatment/Exercise - 04/18/21 0001       Exercises   Exercises Knee/Hip;Ankle      Knee/Hip Exercises: Standing   Functional Squat 10 reps    Functional Squat Limitations black loop around knees, deep squat    Other Standing Knee Exercises lat and fwd step up onto rounded side of BOSU x 10 each, Rt      Manual Therapy   Manual Therapy Soft tissue mobilization;Other (comment);Joint  mobilization    Joint Mobilization subtalar eversion Gr III/IV mob with movement into DF and knee flexion, prone, seated talocrural mobs for DF mob with movement    Soft tissue mobilization soleus, post tibialis, medial gastroc stripping and TP release    Other Manual Therapy active release Lt soleus and medial gastroc, prone      Ankle Exercises: Stretches   Plantar Fascia Stretch Limitations Rt x 30 sec    Slant Board Stretch 2 reps;30 seconds   1x30 each for gastroc (knee straight) and soleus (knee bent)     Ankle Exercises: Aerobic   Recumbent Bike L2 x 4' PT present to discuss symptoms      Ankle Exercises: Machines for Strengthening   Cybex Leg Press 30lb heel raise Rt only x 10      Ankle Exercises: Standing   SLS Rt foot on foam pad x 1' no UE support   then with beach ball toss x 15 throws   Other Standing Ankle Exercises step downs Rt LE on step, step down with Lt heel tap floor 1x10 from 6" riser (for functional ankle DF on Rt)                       PT Short Term Goals - 04/04/21 1105       PT SHORT TERM GOAL #1   Title Pt to be independent with initial HEP    Status Achieved               PT Long Term Goals - 04/04/21 1105       PT LONG TERM GOAL #1   Title Pt to be indepednent with final HEP    Status On-going      PT LONG TERM GOAL #2   Title Patient to demonstrate improvement in ankle mobility and ROM to be symmetrical to left ankle to allow for deep squatting    Status On-going      PT LONG TERM GOAL #3   Title Patient to report 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  She has stiffness in talocrural  joint and subtalar joint for ankle DF and eversion.  She has lateral ankle weakness and fair proprioception on Rt ankle likely secondary to past sprains.  She has TPs in medial soleus, posterior tib and medial gastroc which improved with deep tissue stripping and active release today.  Pt reported short lived improvement after last session so addition of active release and more mobs will hopefully provide greater carry over from today. Pt noted much looser ankle end of session.    PT Next Visit Plan continue active release, stripping Rt calf, ankle mobs for DF and eversion, continue proprioception and lateral ankle strengthening    PT Home Exercise Plan Access Code: [email or web address]    Consulted and Agree with Plan of Care Patient             Patient will benefit from skilled therapeutic intervention in order to improve the following deficits and impairments:     Visit Diagnosis: Stiffness of right ankle, not elsewhere classified  Ankle instability, right  Difficulty in walking, not elsewhere classified     Problem List Patient Active Problem List   Diagnosis Date Noted   Extraperitoneal bladder perforation 05/17/2020   History of recurrent UTI (urinary tract infection) 05/08/2020   Free intraperitoneal air 05/08/2020   Anxiety    Abnormal CT of the abdomen    Hepatic steatosis 05/07/2020   Pneumoperitoneum 05/07/2020   Foley catheter in place 05/07/2020   Anal fissure 05/07/2020   Change in bowel habit 0000000   Periumbilical pain 0000000   Rectal bleeding 05/07/2020   History of gestational diabetes 05/07/2020   History of depression 05/07/2020   Portal hypertension ([place])    History of urinary retention 04/15/2020   Chronic abdominal pain 04/13/2020   Narcolepsy without cataplexy 02/02/2020   Depression, major, single episode, moderate ([place]) 10/28/2018   Elevated CA-125 08/06/2018   ARF (acute renal failure) ([place]) 06/25/2018   Alcohol abuse 06/24/2018   Constipation,  chronic 06/24/2018   Ascites 06/24/2018   Body mass index (BMI) of 25.0 to 29.9 05/29/2018   Fibrocystic disease of breast 05/29/2018   Metrorrhagia 05/29/2018   Urinary tract infectious disease 05/29/2018   Oral hypoglycemic controlled White classification A2 gestational diabetes mellitus (GDM) 11/18/2017   Previous cesarean delivery, delivered: arrest of dilation  11/18/2017   Abnormal glucose tolerance test (GTT) during pregnancy, antepartum 09/17/2017   Chronic active viral hepatitis B ([place]) 06/14/2014   Ryna Beckstrom, PT 04/18/21 11:02 AM    [facility] @ [facility] [place], [place], [address] Phone: [phone]   Fax:  [phone]  Name: BURNITA TRYBUS MRN: [id number] Date of Birth: 11/14/1985

## 2021-04-25 ENCOUNTER — Encounter: Payer: Self-pay | Admitting: Physical Therapy

## 2021-04-25 ENCOUNTER — Ambulatory Visit: Payer: BC Managed Care – PPO | Admitting: Physical Therapy

## 2021-04-25 ENCOUNTER — Other Ambulatory Visit: Payer: Self-pay

## 2021-04-25 DIAGNOSIS — M25671 Stiffness of right ankle, not elsewhere classified: Secondary | ICD-10-CM | POA: Diagnosis not present

## 2021-04-25 DIAGNOSIS — R262 Difficulty in walking, not elsewhere classified: Secondary | ICD-10-CM

## 2021-04-25 DIAGNOSIS — M25371 Other instability, right ankle: Secondary | ICD-10-CM

## 2021-04-25 NOTE — Patient Instructions (Signed)
Access Code: [email or web address] URL: [email or web address] Date: 04/25/2021 Prepared by: Loistine Simas Kayana Thoen  Exercises Gastroc Stretch on Wall - 1 x daily - 7 x weekly - 1 sets - 3 reps - 30 hold Soleus Stretch on Wall - 1 x daily - 7 x weekly - 1 sets - 3 reps - 30 hold Single Leg Stance - 1 x daily - 7 x weekly - 1 sets - 5 reps - 30 sec hold Single Leg Heel Raise with Counter Support - 1 x daily - 7 x weekly - 3 sets - 5 reps Seated Ankle Eversion with Resistance - 1 x daily - 7 x weekly - 3 sets - 10 reps Seated Toe Towel Scrunches - 1 x daily - 7 x weekly - 1 sets - 60 reps Lateral Lunge - 1 x daily - 7 x weekly - 1 sets - 10 reps Reverse Lunge on Slider - 1 x daily - 7 x weekly - 1 sets - 10 reps

## 2021-04-25 NOTE — Therapy (Signed)
[facility] @ [facility] [address] [place], [place], [address] Phone: [phone]   Fax:  [phone]  Physical Therapy Treatment  Patient Details  Name: Kim Pacheco MRN: [id number] Date of Birth: 03-28-1986 Referring Provider (PT): Wynn Banker, MD   Encounter Date: 04/25/2021   PT End of Session - 04/25/21 1021     Visit Number 5    Date for PT Re-Evaluation 05/09/21    Authorization Type BCBS    Authorization - Visit Number 5    Authorization - Number of Visits 30    Progress Note Due on Visit 10    PT Start Time 1017    PT Stop Time 1100    PT Time Calculation (min) 43 min    Activity Tolerance Patient tolerated treatment well    Behavior During Therapy [facility] for tasks assessed/performed             Past Medical History:  Diagnosis Date   Abdominal pain    started in mid abd and spread to left side/on and off/since Jan 2020/ pt states, she has fluid in abd/unknown   Abnormal Pap smear    normal 06/2013   Anxiety    Constipation    hx of   Depression    suicide attempt in college   Diabetes mellitus without complication (HCC)    gestational, no issues since pregnancy per her report - resolved   Gestational diabetes    /no meds now - resolved   Hepatitis    Hep B, managed by Dr. Kinnie Scales   History of chicken pox    History of suicide attempt 2003   Attempt in college   History of UTI    Vaginal Pap smear, abnormal     Past Surgical History:  Procedure Laterality Date   BIOPSY  05/10/2020   Procedure: BIOPSY;  Surgeon: Benancio Deeds, MD;  Location: [facility];  Service: Gastroenterology;;   BLADDER REPAIR N/A 05/17/2020   Procedure: EXPLORATORY LAPAROTOMY WITH CYSTORRHAPHY;  Surgeon: Marcine Matar, MD;  Location: [facility];  Service: Urology;  Laterality: N/A;   CESAREAN SECTION N/A 01/26/2013   Procedure: Primary Cesarean Section Delivery Baby  Boy @ 0017, Apgars 4/8/9;  Surgeon:  Freddrick March. Tenny Craw, MD;  Location: [facility];  Service: Obstetrics;  Laterality: N/A;   CESAREAN SECTION N/A 11/18/2017   Procedure: CESAREAN SECTION;  Surgeon: Olivia Mackie, MD;  Location: [facility];  Service: Obstetrics;  Laterality: N/A;   COLONOSCOPY  2020   DIAGNOSTIC LAPAROSCOPIC LIVER BIOPSY N/A 05/07/2020   Procedure: DIAGNOSTIC LAPAROSCOPIC;  Surgeon: Karie Soda, MD;  Location: [facility];  Service: General;  Laterality: N/A;   DIAGNOSTIC LAPAROSCOPY  08/25/2018   Dr Clifton James with Ob/Gyn3/2020   ESOPHAGOGASTRODUODENOSCOPY (EGD) WITH PROPOFOL N/A 05/10/2020   Procedure: ESOPHAGOGASTRODUODENOSCOPY (EGD) WITH PROPOFOL;  Surgeon: Benancio Deeds, MD;  Location: [facility];  Service: Gastroenterology;  Laterality: N/A;   ESOPHAGOGASTRODUODENOSCOPY ENDOSCOPY     IR TRANSCATHETER BX  04/18/2020   IR US GUIDE VASC ACCESS RIGHT  04/18/2020   IR VENOGRAM HEPATIC W HEMODYNAMIC EVALUATION  04/18/2020   NASAL SINUS SURGERY  1996   WISDOM TOOTH EXTRACTION      There were no vitals filed for this visit.   Subjective Assessment - 04/25/21 1019     Subjective I was better after last time.  The ankle felt looser and stayed looser longer this time before tightening back up.  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  I am willing to try the DN if it will help speed things up.    Currently in Pain? Yes    Pain Score 8     Pain Location Ankle    Pain Orientation Right    Pain Descriptors / Indicators Tightness    Pain Type Chronic pain    Pain Onset More than a month ago                               [facility] Adult PT Treatment/Exercise - 04/25/21 0001       Self-Care   Self-Care Other Self-Care Comments    Other Self-Care Comments  plantar fascia rolling, Rt, using spikey ball and lacrosse ball each x 30"      Exercises   Exercises Knee/Hip;Ankle      Knee/Hip Exercises: Aerobic   Recumbent Bike L2 x 6' PT present to  discuss status      Knee/Hip Exercises: Standing   Other Standing Knee Exercises slider lunge x 10: lateral and posterior standing on Rt LE, Lt leg moves on slider      Manual Therapy   Manual Therapy Soft tissue mobilization    Soft tissue mobilization soleus, post tibialis, medial gastroc stripping and TP release, Addaday assist to medial calf x 2'      Ankle Exercises: Standing   SLS Rt foot on green oval disc, move Lt leg in/out/fwd/back x 20 pulses each   no UE support     Ankle Exercises: Seated   Towel Crunch --   1 min, Rt   Other Seated Ankle Exercises blue tband ankle eversion, inversion, and plantar flexion x 20 each, Rt              Trigger Point Dry Needling - 04/25/21 0001     Consent Given? Yes    Education Handout Provided Yes    Muscles Treated Lower Quadrant Gastrocnemius    Dry Needling Comments medial head, Pt declined further DN after single DN    Gastrocnemius Response Twitch response elicited;Palpable increased muscle length                   PT Education - 04/25/21 1057     Education Details progress tband to blue for ankle, added towel scrunch and slider 2-way lunge    Person(s) Educated Patient    Methods Explanation;Demonstration;Handout    Comprehension Verbalized understanding;Returned demonstration              PT Short Term Goals - 04/04/21 1105       PT SHORT TERM GOAL #1   Title Pt to be independent with initial HEP    Status Achieved               PT Long Term Goals - 04/04/21 1105       PT LONG TERM GOAL #1   Title Pt to be indepednent with final HEP    Status On-going      PT LONG TERM GOAL #2   Title Patient to demonstrate improvement in ankle mobility and ROM to be symmetrical to left ankle to allow for deep squatting    Status On-going      PT LONG TERM GOAL #3   Title Patient 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  She noted her original Rt ankle pain and stiffness were not as bad and she had improved mobility x 4 days following last session.  She did agree to try DN today which was performed to medial gastroc x 1 but Pt reported she didn't want to do any more after that.  Pt does have improved subtalar eversion and talocrural DF carried over from last visit.  She has ongoing TPs along medial aspect of gastroc and soleus.  PT progressed ankle tband to blue today and added slider lunges, and towel crunch for arch strengthening.  Pt reported reduced pain and stiffness to 4/10 end of session which was the lowest she has been since starting PT.  She believes the single DN may have helped.  Continue along POC with ongoing assessment of response to treatment.    Rehab Potential Good    PT Frequency 1x / week    PT Duration 6 weeks    PT Treatment/Interventions ADLs/Self Care Home Management;Aquatic Therapy;Cryotherapy;Ultrasound;Iontophoresis 4mg /ml Dexamethasone;Electrical Stimulation;Gait training;Stair training;Functional mobility training;Neuromuscular re-education;Balance training;Therapeutic exercise;Therapeutic activities;Patient/family education;Manual techniques;Passive range of motion;Taping;Joint Manipulations;Vasopneumatic Device    PT Next Visit Plan STM to soleus and medial gastroc, ankle mobs for DF and eversion, continue proprioception, hip strength, and lateral ankle strengthening    PT Home Exercise Plan Access Code: [email or web address]    Consulted and Agree with Plan of Care Patient             Patient will benefit from skilled therapeutic intervention in order to improve the following deficits and impairments:     Visit Diagnosis: Stiffness of  right ankle, not elsewhere classified  Ankle instability, right  Difficulty in walking, not elsewhere classified     Problem List Patient Active Problem List   Diagnosis Date Noted   Extraperitoneal bladder perforation 05/17/2020   History of recurrent UTI (urinary tract infection) 05/08/2020   Free intraperitoneal air 05/08/2020   Anxiety    Abnormal CT of the abdomen    Hepatic steatosis 05/07/2020   Pneumoperitoneum 05/07/2020   Foley catheter in place 05/07/2020   Anal fissure 05/07/2020   Change in bowel habit 0000000   Periumbilical pain 0000000   Rectal bleeding 05/07/2020   History of gestational diabetes 05/07/2020   History of depression 05/07/2020   Portal hypertension ([place])    History of urinary retention 04/15/2020   Chronic abdominal pain 04/13/2020   Narcolepsy without cataplexy 02/02/2020   Depression, major, single episode, moderate ([place]) 10/28/2018   Elevated CA-125 08/06/2018   ARF (acute renal failure) ([place]) 06/25/2018   Alcohol abuse 06/24/2018   Constipation, chronic 06/24/2018   Ascites 06/24/2018   Body mass index (BMI) of 25.0 to 29.9 05/29/2018   Fibrocystic disease of breast 05/29/2018   Metrorrhagia 05/29/2018   Urinary tract infectious disease 05/29/2018   Oral hypoglycemic controlled White classification A2 gestational diabetes mellitus (GDM) 11/18/2017   Previous cesarean delivery, delivered: arrest of dilation  11/18/2017   Abnormal glucose tolerance test (GTT) during pregnancy, antepartum 09/17/2017   Chronic active viral hepatitis B ([place]) 06/14/2014    Kim Pacheco  Kim Pacheco, PT 04/25/21 1:39 PM   [facility] @ [facility] [place], [place], [address] Phone: [phone]   Fax:  [phone]  Name: Kim Pacheco MRN: [id number] Date of Birth: April 14, 1986

## 2021-04-27 DIAGNOSIS — K7400 Hepatic fibrosis, unspecified: Secondary | ICD-10-CM | POA: Diagnosis not present

## 2021-04-27 DIAGNOSIS — B181 Chronic viral hepatitis B without delta-agent: Secondary | ICD-10-CM | POA: Diagnosis not present

## 2021-04-27 DIAGNOSIS — K760 Fatty (change of) liver, not elsewhere classified: Secondary | ICD-10-CM | POA: Diagnosis not present

## 2021-05-02 ENCOUNTER — Other Ambulatory Visit: Payer: Self-pay

## 2021-05-02 ENCOUNTER — Ambulatory Visit: Payer: BC Managed Care – PPO

## 2021-05-02 ENCOUNTER — Encounter: Payer: Self-pay | Admitting: Family Medicine

## 2021-05-02 DIAGNOSIS — M25371 Other instability, right ankle: Secondary | ICD-10-CM | POA: Diagnosis not present

## 2021-05-02 DIAGNOSIS — R262 Difficulty in walking, not elsewhere classified: Secondary | ICD-10-CM

## 2021-05-02 DIAGNOSIS — M25671 Stiffness of right ankle, not elsewhere classified: Secondary | ICD-10-CM

## 2021-05-02 NOTE — Therapy (Signed)
[facility] @ [place] [address] [place], [place], [address] Phone: [phone]   Fax:  [phone]  Physical Therapy Treatment  Patient Details  Name: Kim Pacheco MRN: [id number] Date of Birth: 1986-05-31 Referring Provider (PT): Caren Macadam, MD   Encounter Date: 05/02/2021   PT End of Session - 05/02/21 1105     Visit Number 6    Date for PT Re-Evaluation 05/09/21    Authorization Type BCBS    Authorization - Visit Number 6    Authorization - Number of Visits 30    Progress Note Due on Visit 10    PT Start Time 1018    PT Stop Time 1101    PT Time Calculation (min) 43 min    Activity Tolerance Patient tolerated treatment well    Behavior During Therapy [facility] for tasks assessed/performed             Past Medical History:  Diagnosis Date   Abdominal pain    started in mid abd and spread to left side/on and off/since Jan 2020/ pt states, she has fluid in abd/unknown   Abnormal Pap smear    normal 06/2013   Anxiety    Constipation    hx of   Depression    suicide attempt in college   Diabetes mellitus without complication ([place])    gestational, no issues since pregnancy per her report - resolved   Gestational diabetes    /no meds now - resolved   Hepatitis    Hep B, managed by Dr. Earlean Shawl   History of chicken pox    History of suicide attempt 2003   Attempt in college   History of UTI    Vaginal Pap smear, abnormal     Past Surgical History:  Procedure Laterality Date   BIOPSY  05/10/2020   Procedure: BIOPSY;  Surgeon: Yetta Flock, MD;  Location: [facility];  Service: Gastroenterology;;   BLADDER REPAIR N/A 05/17/2020   Procedure: EXPLORATORY LAPAROTOMY WITH CYSTORRHAPHY;  Surgeon: Franchot Gallo, MD;  Location: [facility];  Service: Urology;  Laterality: N/A;   CESAREAN SECTION N/A 01/26/2013   Procedure: Primary Cesarean Section Delivery Baby  Boy @ 0017, Apgars 4/8/9;  Surgeon:  Farrel Gobble. Harrington Challenger, MD;  Location: [facility];  Service: Obstetrics;  Laterality: N/A;   CESAREAN SECTION N/A 11/18/2017   Procedure: CESAREAN SECTION;  Surgeon: Brien Few, MD;  Location: [facility];  Service: Obstetrics;  Laterality: N/A;   COLONOSCOPY  2020   DIAGNOSTIC LAPAROSCOPIC LIVER BIOPSY N/A 05/07/2020   Procedure: DIAGNOSTIC LAPAROSCOPIC;  Surgeon: Michael Boston, MD;  Location: [facility];  Service: General;  Laterality: N/A;   DIAGNOSTIC LAPAROSCOPY  08/25/2018   Dr Polly Cobia with Ob/Gyn3/2020   ESOPHAGOGASTRODUODENOSCOPY (EGD) WITH PROPOFOL N/A 05/10/2020   Procedure: ESOPHAGOGASTRODUODENOSCOPY (EGD) WITH PROPOFOL;  Surgeon: Yetta Flock, MD;  Location: [facility];  Service: Gastroenterology;  Laterality: N/A;   ESOPHAGOGASTRODUODENOSCOPY ENDOSCOPY     IR TRANSCATHETER BX  04/18/2020   IR US GUIDE VASC ACCESS RIGHT  04/18/2020   IR VENOGRAM HEPATIC W HEMODYNAMIC EVALUATION  04/18/2020   NASAL SINUS SURGERY  1996   WISDOM TOOTH EXTRACTION      There were no vitals filed for this visit.   Subjective Assessment - 05/02/21 1019     Subjective I felt tight in my calf after DN.  I think the stretching and massage help,    Currently in Pain? Yes  Pain Score 1     Pain Location Ankle    Pain Orientation Right    Pain Descriptors / Indicators Tightness    Pain Type Chronic pain    Pain Onset More than a month ago    Pain Frequency Constant    Aggravating Factors  squatting, stairs                               [facility] Adult PT Treatment/Exercise - 05/02/21 0001       Knee/Hip Exercises: Aerobic   Recumbent Bike L2 x 6' PT present to discuss status      Knee/Hip Exercises: Standing   Rocker Board 3 minutes    Other Standing Knee Exercises slider lunge x 10: lateral, diagonal and posterior standing on Rt LE, Lt leg moves on slider      Manual Therapy   Manual Therapy Soft tissue mobilization    Soft tissue mobilization soleus, post tibialis,  medial gastroc stripping and TP release, Addaday assist to medial calf x 2'    Other Manual Therapy active release Lt soleus and medial gastroc, prone      Ankle Exercises: Stretches   Slant Board Stretch 2 reps;30 seconds   1x30 each for gastroc (knee straight) and soleus (knee bent)     Ankle Exercises: Standing   SLS Rt foot on green oval disc, move Lt leg in/out/fwd/back x 20 pulses each   no UE support                      PT Short Term Goals - 04/04/21 1105       PT SHORT TERM GOAL #1   Title Pt to be independent with initial HEP    Status Achieved               PT Long Term Goals - 04/04/21 1105       PT LONG TERM GOAL #1   Title Pt to be indepednent with final HEP    Status On-going      PT LONG TERM GOAL #2   Title Patient to demonstrate improvement in ankle mobility and ROM to be symmetrical to left ankle to allow for deep squatting    Status On-going      PT LONG TERM GOAL #3   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  She does not want to do DN again today as it felt like her calf was tighter after last session. Pt is independent and compliant with her current HEP.  Marland Kitchen  She has ongoing TPs along medial aspect of gastroc and soleus and responded well with manual therapy today including suction cup and use of blades.  Continue along POC with ongoing assessment of response to treatment.    PT Frequency 1x / week    PT Duration 6 weeks    PT Treatment/Interventions ADLs/Self Care Home Management;Aquatic Therapy;Cryotherapy;Ultrasound;Iontophoresis 4mg /ml Dexamethasone;Electrical Stimulation;Gait training;Stair  training;Functional mobility  training;Neuromuscular re-education;Balance training;Therapeutic exercise;Therapeutic activities;Patient/family education;Manual techniques;Passive range of motion;Taping;Joint Manipulations;Vasopneumatic Device    PT Next Visit Plan STM to soleus and medial gastroc, ankle mobs for DF and eversion, continue proprioception, hip strength, and lateral ankle strengthening    PT Home Exercise Plan Access Code: [email or web address]    Consulted and Agree with Plan of Care Patient             Patient will benefit from skilled therapeutic intervention in order to improve the following deficits and impairments:  Decreased mobility, Difficulty walking, Hypomobility, Decreased range of motion, Decreased strength, Impaired flexibility  Visit Diagnosis: Stiffness of right ankle, not elsewhere classified  Ankle instability, right  Difficulty in walking, not elsewhere classified     Problem List Patient Active Problem List   Diagnosis Date Noted   Extraperitoneal bladder perforation 05/17/2020   History of recurrent UTI (urinary tract infection) 05/08/2020   Free intraperitoneal air 05/08/2020   Anxiety    Abnormal CT of the abdomen    Hepatic steatosis 05/07/2020   Pneumoperitoneum 05/07/2020   Foley catheter in place 05/07/2020   Anal fissure 05/07/2020   Change in bowel habit 0000000   Periumbilical pain 0000000   Rectal bleeding 05/07/2020   History of gestational diabetes 05/07/2020   History of depression 05/07/2020   Portal hypertension ([place])    History of urinary retention 04/15/2020   Chronic abdominal pain 04/13/2020   Narcolepsy without cataplexy 02/02/2020   Depression, major, single episode, moderate (HCC) 10/28/2018   Elevated CA-125 08/06/2018   ARF (acute renal failure) ([place]) 06/25/2018   Alcohol abuse 06/24/2018   Constipation, chronic 06/24/2018   Ascites 06/24/2018   Body mass index (BMI) of 25.0 to 29.9 05/29/2018   Fibrocystic disease of  breast 05/29/2018   Metrorrhagia 05/29/2018   Urinary tract infectious disease 05/29/2018   Oral hypoglycemic controlled White classification A2 gestational diabetes mellitus (GDM) 11/18/2017   Previous cesarean delivery, delivered: arrest of dilation  11/18/2017   Abnormal glucose tolerance test (GTT) during pregnancy, antepartum 09/17/2017   Chronic active viral hepatitis B ([place]) 06/14/2014   Sigurd Sos, PT 05/02/21 11:08 AM   [facility] @ [address] [place], [place], [address] Phone: [phone]   Fax:  [phone]  Name: Kim Pacheco MRN: [id number] Date of Birth: Aug 23, 1985

## 2021-05-09 ENCOUNTER — Telehealth: Payer: Self-pay

## 2021-05-09 ENCOUNTER — Other Ambulatory Visit: Payer: Self-pay

## 2021-05-09 ENCOUNTER — Encounter: Payer: Self-pay | Admitting: Rehabilitative and Restorative Service Providers"

## 2021-05-09 ENCOUNTER — Ambulatory Visit: Payer: BC Managed Care – PPO | Admitting: Rehabilitative and Restorative Service Providers"

## 2021-05-09 DIAGNOSIS — R262 Difficulty in walking, not elsewhere classified: Secondary | ICD-10-CM | POA: Diagnosis not present

## 2021-05-09 DIAGNOSIS — M25371 Other instability, right ankle: Secondary | ICD-10-CM | POA: Diagnosis not present

## 2021-05-09 DIAGNOSIS — M25671 Stiffness of right ankle, not elsewhere classified: Secondary | ICD-10-CM | POA: Diagnosis not present

## 2021-05-09 DIAGNOSIS — M25562 Pain in left knee: Secondary | ICD-10-CM

## 2021-05-09 NOTE — Telephone Encounter (Signed)
Patient called asking for a referral to an Orthopedic in relation to previous referral to physical therapy

## 2021-05-09 NOTE — Telephone Encounter (Signed)
Ok for referral to ortho for knee pain

## 2021-05-09 NOTE — Therapy (Signed)
[facility] [place], [place], [address] Phone: [phone]   Fax:  [phone]  Physical Therapy Treatment and Discharge Summary  Patient Details  Name: Kim Pacheco MRN: [id number] Date of Birth: 11-07-1985 Referring Provider (PT): Caren Macadam, MD   Encounter Date: 05/09/2021   PT End of Session - 05/09/21 1018     Visit Number 7    Date for PT Re-Evaluation 05/09/21    Authorization Type BCBS    Authorization - Visit Number 7    Authorization - Number of Visits 30    Progress Note Due on Visit 10    PT Start Time 0923    PT Stop Time 1055    PT Time Calculation (min) 40 min             Past Medical History:  Diagnosis Date   Abdominal pain    started in mid abd and spread to left side/on and off/since Jan 2020/ pt states, she has fluid in abd/unknown   Abnormal Pap smear    normal 06/2013   Anxiety    Constipation    hx of   Depression    suicide attempt in college   Diabetes mellitus without complication ([place])    gestational, no issues since pregnancy per her report - resolved   Gestational diabetes    /no meds now - resolved   Hepatitis    Hep B, managed by Dr. Earlean Shawl   History of chicken pox    History of suicide attempt 2003   Attempt in college   History of UTI    Vaginal Pap smear, abnormal     Past Surgical History:  Procedure Laterality Date   BIOPSY  05/10/2020   Procedure: BIOPSY;  Surgeon: Yetta Flock, MD;  Location: [facility];  Service: Gastroenterology;;   BLADDER REPAIR N/A 05/17/2020   Procedure: EXPLORATORY LAPAROTOMY WITH CYSTORRHAPHY;  Surgeon: Franchot Gallo, MD;  Location: [facility];  Service: Urology;  Laterality: N/A;   CESAREAN SECTION N/A 01/26/2013   Procedure: Primary Cesarean Section Delivery Baby  Boy @ 0017, Apgars 4/8/9;  Surgeon: Farrel Gobble. Harrington Challenger, MD;  Location: [facility];  Service: Obstetrics;  Laterality: N/A;   CESAREAN  SECTION N/A 11/18/2017   Procedure: CESAREAN SECTION;  Surgeon: Brien Few, MD;  Location: [facility];  Service: Obstetrics;  Laterality: N/A;   COLONOSCOPY  2020   DIAGNOSTIC LAPAROSCOPIC LIVER BIOPSY N/A 05/07/2020   Procedure: DIAGNOSTIC LAPAROSCOPIC;  Surgeon: Michael [place], MD;  Location: [facility];  Service: General;  Laterality: N/A;   DIAGNOSTIC LAPAROSCOPY  08/25/2018   Dr Polly Cobia with Ob/Gyn3/2020   ESOPHAGOGASTRODUODENOSCOPY (EGD) WITH PROPOFOL N/A 05/10/2020   Procedure: ESOPHAGOGASTRODUODENOSCOPY (EGD) WITH PROPOFOL;  Surgeon: Yetta Flock, MD;  Location: [facility];  Service: Gastroenterology;  Laterality: N/A;   ESOPHAGOGASTRODUODENOSCOPY ENDOSCOPY     IR TRANSCATHETER BX  04/18/2020   IR US GUIDE VASC ACCESS RIGHT  04/18/2020   IR VENOGRAM HEPATIC W HEMODYNAMIC EVALUATION  04/18/2020   NASAL SINUS SURGERY  1996   WISDOM TOOTH EXTRACTION      There were no vitals filed for this visit.   Subjective Assessment - 05/09/21 1017     Subjective Pt continues to report main complaint of tightness.    Currently in Pain? Yes    Pain Score 8     Pain Location Ankle    Pain Orientation Right    Pain Descriptors / Indicators Tightness  Pain Type Chronic pain                [facility] PT Assessment - 05/09/21 0001       Assessment   Medical Diagnosis M25.571 (ICD-10-CM) - Pain in right ankle and joints of right foot  M25.562 (ICD-10-CM) - Acute pain of left knee    Referring Provider (PT) Caren Macadam, MD      AROM   Right Ankle Dorsiflexion 8    Right Ankle Plantar Flexion 70    Right Ankle Inversion 35    Right Ankle Eversion 25      Strength   Strength Assessment Site Ankle    Right/Left Ankle Right    Right Ankle Plantar Flexion 4+/5    Right Ankle Inversion 4/5    Right Ankle Eversion 4+/5                           [facility] Adult PT Treatment/Exercise - 05/09/21 0001       Knee/Hip Exercises: Aerobic   Recumbent Bike  L2 x 6' PT present to discuss status      Knee/Hip Exercises: Standing   Lateral Step Up Both;1 set;10 reps;Hand Hold: 0    Lateral Step Up Limitations onto bosu    Forward Step Up Both;1 set;10 reps;Hand Hold: 0    Forward Step Up Limitations onto bosu    Rocker Board 3 minutes    Other Standing Knee Exercises slider lunge x 10: lateral, diagonal and posterior standing on Rt LE, Lt leg moves on slider      Manual Therapy   Manual Therapy Soft tissue mobilization    Soft tissue mobilization soleus, post tibialis, medial gastroc stripping and TP release,      Ankle Exercises: Stretches   Slant Board Stretch 2 reps;30 seconds      Ankle Exercises: Standing   SLS Rt foot on green oval disc, move Lt leg in/out/fwd/back x 20 pulses each                       PT Short Term Goals - 04/04/21 1105       PT SHORT TERM GOAL #1   Title Pt to be independent with initial HEP    Status Achieved               PT Long Term Goals - 05/09/21 1033       PT LONG TERM GOAL #1   Title Pt to be indepednent with final HEP    Status Achieved      PT LONG TERM GOAL #2   Title Patient to demonstrate improvement in ankle mobility and ROM to be symmetrical to left ankle to allow for deep squatting    Status Achieved      PT LONG TERM GOAL #3   Title Patient to report 0-1/10 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     PT LONG TERM GOAL #4   Title Patient to report 0-1/10 pain with stair climbing    Status Partially Met   Pt able to perform stair negotiation without increase of pain, but did have baseline pain noted.                  Plan - 05/09/21 1059  Clinical Impression Statement Pt reports that she has had approx 35% improvement since starting PT 6 weeks ago.  Pt continues to have increased pain and states that it is primarily a  tightness feeling and states some tenderness at her [place] tendon area. Pt has made improvements in R ankle ROM and in R ankle strength.  However, she has not improved on her pain, as she continues to have the same level of pain. Pt recommended to follow up with PCP to obtain a referral to orthopedic MD to further assess, as PT has only had partial improvements with therapy. Pt is compliant with HEP and is going to the gym regularly.  Pt discharged at this time to continue with HEP and advised to obtain a referral to an orthopedic specialist for further follow up and assessment secondary to pt continuing to have pain.    PT Treatment/Interventions ADLs/Self Care Home Management;Aquatic Therapy;Cryotherapy;Ultrasound;Iontophoresis 9m/ml Dexamethasone;Electrical Stimulation;Gait training;Stair training;Functional mobility training;Neuromuscular re-education;Balance training;Therapeutic exercise;Therapeutic activities;Patient/family education;Manual techniques;Passive range of motion;Taping;Joint Manipulations;Vasopneumatic Device    PT Next Visit Plan Discharge outpatient PT    Recommended Other Services recommend follow up with ortho    Consulted and Agree with Plan of Care Patient             Patient will benefit from skilled therapeutic intervention in order to improve the following deficits and impairments:  Decreased mobility, Difficulty walking, Hypomobility, Decreased range of motion, Decreased strength, Impaired flexibility  Visit Diagnosis: Stiffness of right ankle, not elsewhere classified  Ankle instability, right  Difficulty in walking, not elsewhere classified     Problem List Patient Active Problem List   Diagnosis Date Noted   Extraperitoneal bladder perforation 05/17/2020   History of recurrent UTI (urinary tract infection) 05/08/2020   Free intraperitoneal air 05/08/2020   Anxiety    Abnormal CT of the abdomen    Hepatic steatosis 05/07/2020   Pneumoperitoneum  05/07/2020   Foley catheter in place 05/07/2020   Anal fissure 05/07/2020   Change in bowel habit 193/26/7124  Periumbilical pain 158/02/9832  Rectal bleeding 05/07/2020   History of gestational diabetes 05/07/2020   History of depression 05/07/2020   Portal hypertension ([facility]    History of urinary retention 04/15/2020   Chronic abdominal pain 04/13/2020   Narcolepsy without cataplexy 02/02/2020   Depression, major, single episode, moderate ([facility] 10/28/2018   Elevated CA-125 08/06/2018   ARF (acute renal failure) ([facility] 06/25/2018   Alcohol abuse 06/24/2018   Constipation, chronic 06/24/2018   Ascites 06/24/2018   Body mass index (BMI) of 25.0 to 29.9 05/29/2018   Fibrocystic disease of breast 05/29/2018   Metrorrhagia 05/29/2018   Urinary tract infectious disease 05/29/2018   Oral hypoglycemic controlled White classification A2 gestational diabetes mellitus (GDM) 11/18/2017   Previous cesarean delivery, delivered: arrest of dilation  11/18/2017   Abnormal glucose tolerance test (GTT) during pregnancy, antepartum 09/17/2017   Chronic active viral hepatitis B ([place] 06/14/2014    PHYSICAL THERAPY DISCHARGE SUMMARY   Patient agrees to discharge. Patient goals were partially met. Patient is being discharged due to maximized rehab potential.    SJuel Burrow PT, DPT 05/09/2021, 11:11 AM  [facility] [email or web address] [id number]: [phone]  Fax:  [phone] Name: Kim GIOVANNINIMRN: [id number] of Birth: 725-Dec-1987

## 2021-05-10 NOTE — Addendum Note (Signed)
Addended by: Johnella Moloney on: 05/10/2021 09:03 AM   Modules accepted: Orders

## 2021-05-10 NOTE — Telephone Encounter (Signed)
Referral placed as below.  Spoke with the patient and informed her of this. Patient stated she needed the referral for ankle pain, not knee pain.  Message sent to PCP.

## 2021-05-15 ENCOUNTER — Ambulatory Visit (INDEPENDENT_AMBULATORY_CARE_PROVIDER_SITE_OTHER): Payer: BC Managed Care – PPO | Admitting: Orthopaedic Surgery

## 2021-05-15 ENCOUNTER — Encounter: Payer: Self-pay | Admitting: Orthopaedic Surgery

## 2021-05-15 ENCOUNTER — Ambulatory Visit: Payer: Self-pay

## 2021-05-15 DIAGNOSIS — M25571 Pain in right ankle and joints of right foot: Secondary | ICD-10-CM | POA: Diagnosis not present

## 2021-05-15 NOTE — Progress Notes (Signed)
Office Visit Note   Patient: Kim Pacheco           Date of Birth: 15-Sep-1985           MRN: [id number] Visit Date: 05/15/2021              Requested by: Caren Macadam, MD [place],   [id number] PCP: Caren Macadam, MD   Assessment & Plan: Visit Diagnoses:  1. Pain in right ankle and joints of right foot     Plan: Impression is right ankle peroneal tendinitis.  I have discussed immobilizing the ankle in a cam walker while bearing weight.  Have also discussed topical anti-inflammatories as she is unable to take oral anti-inflammatories due to history of gastric perforation.  She is agreeable to this plan.  She will follow-up with [place] in 4 to 6 weeks time if her symptoms have not improved.  Call with concerns or questions in the meantime.  Follow-Up Instructions: Return if symptoms worsen or fail to improve.   Orders:  Orders Placed This Encounter  Procedures   XR Ankle Complete Right   No orders of the defined types were placed in this encounter.     Procedures: No procedures performed   Clinical Data: No additional findings.   Subjective: Chief Complaint  Patient presents with   Right Ankle - Pain    HPI patient is a pleasant 35 year old female who comes in today with right ankle pain for the past 2 years.  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  The pain she has is to the lateral ankle.  She describes this as a constant tightness with worsening pain after photographing a wedding for 10 to 12 hours.  She does not take any medication for this.  She does have a history of gastric perforation and is unable to take anti-inflammatories.  She has however been to physical therapy for 6 weeks without improvement.  She has not been immobilized in a cam walker.  Review of Systems as detailed in HPI.  All  others reviewed and are negative.   Objective: Vital Signs: There were no vitals taken for this visit.  Physical Exam well-developed well-nourished female no acute distress.  Alert and oriented x3.  Ortho Exam right ankle exam shows no swelling.  Mild tenderness along the peroneal tendon along the distal Achilles.  No pain with plantar flexion, dorsiflexion, inversion or eversion.  No bony tenderness.  She is neurovascular intact distally.  Specialty Comments:  No specialty comments available.  Imaging: XR Ankle Complete Right  Result Date: 05/15/2021 Negative for acute findings.    PMFS History: Patient Active Problem List   Diagnosis Date Noted   Extraperitoneal bladder perforation 05/17/2020   History of recurrent UTI (urinary tract infection) 05/08/2020   Free intraperitoneal air 05/08/2020   Anxiety    Abnormal CT of the abdomen    Hepatic steatosis 05/07/2020   Pneumoperitoneum 05/07/2020   Foley catheter in place 05/07/2020   Anal fissure 05/07/2020   Change in bowel habit 0000000   Periumbilical pain 0000000   Rectal bleeding 05/07/2020   History of gestational diabetes 05/07/2020   History of depression 05/07/2020   Portal hypertension ([place])    History of urinary retention 04/15/2020   Chronic abdominal pain 04/13/2020   Narcolepsy without cataplexy 02/02/2020  Depression, major, single episode, moderate ([facility]) 10/28/2018   Elevated CA-125 08/06/2018   ARF (acute renal failure) ([facility]) 06/25/2018   Alcohol abuse 06/24/2018   Constipation, chronic 06/24/2018   Ascites 06/24/2018   Body mass index (BMI) of 25.0 to 29.9 05/29/2018   Fibrocystic disease of breast 05/29/2018   Metrorrhagia 05/29/2018   Urinary tract infectious disease 05/29/2018   Oral hypoglycemic controlled White classification A2 gestational diabetes mellitus (GDM) 11/18/2017   Previous cesarean delivery, delivered: arrest of dilation  11/18/2017   Abnormal glucose tolerance test (GTT)  during pregnancy, antepartum 09/17/2017   Chronic active viral hepatitis B (H[facility]) 06/14/2014   Past Medical History:  Diagnosis Date   Abdominal pain    started in mid abd and spread to left side/on and off/since Jan 2020/ pt states, she has fluid in abd/unknown   Abnormal Pap smear    normal 06/2013   Anxiety    Constipation    hx of   Depression    suicide attempt in college   Diabetes mellitus without complication ([facility])    gestational, no issues since pregnancy per her report - resolved   Gestational diabetes    /no meds now - resolved   Hepatitis    Hep B, managed by Dr. Kinnie Scales   History of chicken pox    History of suicide attempt 2003   Attempt in college   History of UTI    Vaginal Pap smear, abnormal     Family History  Adopted: Yes  Family history unknown: Yes    Past Surgical History:  Procedure Laterality Date   BIOPSY  05/10/2020   Procedure: BIOPSY;  Surgeon: Benancio Deeds, MD;  Location: [facility];  Service: Gastroenterology;;   BLADDER REPAIR N/A 05/17/2020   Procedure: EXPLORATORY LAPAROTOMY WITH CYSTORRHAPHY;  Surgeon: Marcine Matar, MD;  Location: [facility];  Service: Urology;  Laterality: N/A;   CESAREAN SECTION N/A 01/26/2013   Procedure: Primary Cesarean Section Delivery Baby  Boy @ 0017, Apgars 4/8/9;  Surgeon: Freddrick March. Tenny Craw, MD;  Location: [facility];  Service: Obstetrics;  Laterality: N/A;   CESAREAN SECTION N/A 11/18/2017   Procedure: CESAREAN SECTION;  Surgeon: Olivia Mackie, MD;  Location: [facility];  Service: Obstetrics;  Laterality: N/A;   COLONOSCOPY  2020   DIAGNOSTIC LAPAROSCOPIC LIVER BIOPSY N/A 05/07/2020   Procedure: DIAGNOSTIC LAPAROSCOPIC;  Surgeon: Karie Soda, MD;  Location: [facility];  Service: General;  Laterality: N/A;   DIAGNOSTIC LAPAROSCOPY  08/25/2018   Dr Clifton James with Ob/Gyn3/2020   ESOPHAGOGASTRODUODENOSCOPY (EGD) WITH PROPOFOL N/A 05/10/2020   Procedure: ESOPHAGOGASTRODUODENOSCOPY (EGD) WITH PROPOFOL;  Surgeon:  Benancio Deeds, MD;  Location: [facility];  Service: Gastroenterology;  Laterality: N/A;   ESOPHAGOGASTRODUODENOSCOPY ENDOSCOPY     IR TRANSCATHETER BX  04/18/2020   IR US GUIDE VASC ACCESS RIGHT  04/18/2020   IR VENOGRAM HEPATIC W HEMODYNAMIC EVALUATION  04/18/2020   NASAL SINUS SURGERY  1996   WISDOM TOOTH EXTRACTION     Social History   Occupational History   Occupation: Environmental manager  Tobacco Use   Smoking status: Former    Packs/day: 1.00    Types: Cigarettes    Quit date: 07/23/2011    Years since quitting: 9.8   Smokeless tobacco: Never   Tobacco comments:    smoked for 8 years, quit in 2013  Vaping Use   Vaping Use: Never used  Substance and Sexual Activity   Alcohol use: Not Currently    Alcohol/week: 5.0 standard drinks  Types: 5 Glasses of wine per week    Comment: h/o binge drinking - more abstinent 2021-   Drug use: Not Currently    Types: Marijuana    Comment: Last use 12/19/18   Sexual activity: Yes

## 2021-05-16 ENCOUNTER — Encounter: Payer: BC Managed Care – PPO | Admitting: Physical Therapy

## 2021-05-24 DIAGNOSIS — Z91199 Patient's noncompliance with other medical treatment and regimen due to unspecified reason: Secondary | ICD-10-CM | POA: Insufficient documentation

## 2021-05-24 DIAGNOSIS — K760 Fatty (change of) liver, not elsewhere classified: Secondary | ICD-10-CM | POA: Diagnosis not present

## 2021-05-24 DIAGNOSIS — F1010 Alcohol abuse, uncomplicated: Secondary | ICD-10-CM | POA: Diagnosis not present

## 2021-05-24 DIAGNOSIS — B181 Chronic viral hepatitis B without delta-agent: Secondary | ICD-10-CM | POA: Diagnosis not present

## 2021-05-24 DIAGNOSIS — K7400 Hepatic fibrosis, unspecified: Secondary | ICD-10-CM | POA: Diagnosis not present

## 2021-05-24 HISTORY — DX: Patient's noncompliance with other medical treatment and regimen due to unspecified reason: Z91.199

## 2021-06-06 ENCOUNTER — Ambulatory Visit (INDEPENDENT_AMBULATORY_CARE_PROVIDER_SITE_OTHER): Payer: BC Managed Care – PPO | Admitting: Family Medicine

## 2021-06-06 ENCOUNTER — Encounter: Payer: Self-pay | Admitting: Family Medicine

## 2021-06-06 VITALS — BP 100/68 | HR 71 | Temp 97.9°F | Wt 135.5 lb

## 2021-06-06 DIAGNOSIS — F419 Anxiety disorder, unspecified: Secondary | ICD-10-CM | POA: Diagnosis not present

## 2021-06-06 DIAGNOSIS — G4700 Insomnia, unspecified: Secondary | ICD-10-CM

## 2021-06-06 DIAGNOSIS — F1010 Alcohol abuse, uncomplicated: Secondary | ICD-10-CM | POA: Diagnosis not present

## 2021-06-06 DIAGNOSIS — R5383 Other fatigue: Secondary | ICD-10-CM

## 2021-06-06 DIAGNOSIS — R739 Hyperglycemia, unspecified: Secondary | ICD-10-CM | POA: Diagnosis not present

## 2021-06-06 DIAGNOSIS — Z23 Encounter for immunization: Secondary | ICD-10-CM | POA: Diagnosis not present

## 2021-06-06 DIAGNOSIS — K760 Fatty (change of) liver, not elsewhere classified: Secondary | ICD-10-CM

## 2021-06-06 LAB — COMPREHENSIVE METABOLIC PANEL
ALT: 51 U/L — ABNORMAL HIGH (ref 0–35)
AST: 33 U/L (ref 0–37)
Albumin: 4.5 g/dL (ref 3.5–5.2)
Alkaline Phosphatase: 37 U/L — ABNORMAL LOW (ref 39–117)
BUN: 11 mg/dL (ref 6–23)
CO2: 27 mEq/L (ref 19–32)
Calcium: 9.5 mg/dL (ref 8.4–10.5)
Chloride: 101 mEq/L (ref 96–112)
Creatinine, Ser: 0.62 mg/dL (ref 0.40–1.20)
GFR: 113.76 mL/min (ref >60.00)
Glucose, Bld: 105 mg/dL — ABNORMAL HIGH (ref 70–99)
Potassium: 4.3 mEq/L (ref 3.5–5.1)
Sodium: 137 mEq/L (ref 135–145)
Total Bilirubin: 0.5 mg/dL (ref 0.2–1.2)
Total Protein: 8.1 g/dL (ref 6.0–8.3)

## 2021-06-06 LAB — VITAMIN D 25 HYDROXY (VIT D DEFICIENCY, FRACTURES): VITD: 13.95 ng/mL — ABNORMAL LOW (ref 30.00–100.00)

## 2021-06-06 LAB — FOLATE: Folate: >24.2 ng/mL (ref >5.9)

## 2021-06-06 LAB — CBC WITH DIFFERENTIAL/PLATELET
Basophils Absolute: 0 10*3/uL (ref 0.0–0.1)
Basophils Relative: 0.5 % (ref 0.0–3.0)
Eosinophils Absolute: 0.1 10*3/uL (ref 0.0–0.7)
Eosinophils Relative: 1.8 % (ref 0.0–5.0)
HCT: 41.8 % (ref 36.0–46.0)
Hemoglobin: 13.9 g/dL (ref 12.0–15.0)
Lymphocytes Relative: 39.1 % (ref 12.0–46.0)
Lymphs Abs: 2.1 10*3/uL (ref 0.7–4.0)
MCHC: 33.2 g/dL (ref 30.0–36.0)
MCV: 91.4 fl (ref 78.0–100.0)
Monocytes Absolute: 0.4 10*3/uL (ref 0.1–1.0)
Monocytes Relative: 7.3 % (ref 3.0–12.0)
Neutro Abs: 2.7 10*3/uL (ref 1.4–7.7)
Neutrophils Relative %: 51.3 % (ref 43.0–77.0)
Platelets: 217 10*3/uL (ref 150.0–400.0)
RBC: 4.58 Mil/uL (ref 3.87–5.11)
RDW: 13.1 % (ref 11.5–15.5)
WBC: 5.3 10*3/uL (ref 4.0–10.5)

## 2021-06-06 LAB — LIPID PANEL
Cholesterol: 192 mg/dL (ref 0–200)
HDL: 64.1 mg/dL (ref >39.00)
LDL Cholesterol: 99 mg/dL (ref 0–99)
NonHDL: 127.8
Total CHOL/HDL Ratio: 3
Triglycerides: 144 mg/dL (ref 0.0–149.0)
VLDL: 28.8 mg/dL (ref 0.0–40.0)

## 2021-06-06 LAB — TSH: TSH: 0.86 u[IU]/mL (ref 0.35–5.50)

## 2021-06-06 LAB — HEMOGLOBIN A1C: Hgb A1c MFr Bld: 5.8 % (ref 4.6–6.5)

## 2021-06-06 LAB — VITAMIN B12: Vitamin B-12: 598 pg/mL (ref 211–911)

## 2021-06-06 MED ORDER — TRAZODONE HCL 50 MG PO TABS
25.0000 mg | ORAL_TABLET | Freq: Every evening | ORAL | 2 refills | Status: DC | PRN
Start: 2021-06-06 — End: 2021-12-10

## 2021-06-06 NOTE — Patient Instructions (Signed)
Work on cutting back week day alcohol to half of current intake for the next week. (Ie 4 glasses of wine/night down to 2 glasses/night)  On weekends still cut to half or 3/4.   Then on second week; limit yourself to your current  goal. Have no alcohol days. Light alcohol days for week days. Still work on max limit for weekends.   Stay at this goal for 3 months.

## 2021-06-06 NOTE — Progress Notes (Signed)
Kim Pacheco DOB: 1986/05/26 Encounter date: 06/06/2021  This is a 35 y.o. female who presents with Chief Complaint  Patient presents with   patient requests labs from liver specialist    History of present illness: Her liver doc requested lipids, diabetes, fat/cholesterol to be checked. Liver has been "up and down" and states that has had ongoing issues with fatty liver.   Tried to stop drinking alcohol but had some symptoms after stopping for a couple of days. Had some shakiness. Doesn't feel ready to quit drinking entirely.  Tends to drink more with stress; but also some social stressors. Does sometimes drink more to help her sleep. Sleep study about a year and a half ago.   Definitely feels like she is anxious. More so in last couple of years.  PHQ9 SCORE ONLY 06/06/2021 08/27/2019 08/27/2019  PHQ-9 Total Score 5 7 7    No more abdominal pain issues. No nausea, vomiting.   Feels tired overall.   Diet overall - not much fruit, some vegetables. Generally good about getting well rounded.   Would like to weigh less. Working on exercise, eats pretty healthy. Goal 120-125lb.     Allergies  Allergen Reactions   Contrast Media [Iodinated Contrast Media] Anaphylaxis, Shortness Of Breath and Rash    IV contrast    Nsaids Other (See Comments)    Hx gastritis & gastric perforation    Prednisone & Diphenhydramine Other (See Comments)    Don't like to take it   Current Meds  Medication Sig   tenofovir (VIREAD) 300 MG tablet Take 300 mg by mouth daily.   traZODone (DESYREL) 50 MG tablet Take 0.5-1 tablets (25-50 mg total) by mouth at bedtime as needed for sleep.    Review of Systems  Constitutional:  Negative for chills, fatigue and fever.  Respiratory:  Negative for cough, chest tightness, shortness of breath and wheezing.   Cardiovascular:  Negative for chest pain, palpitations and leg swelling.  Gastrointestinal:  Negative for abdominal pain.   Psychiatric/Behavioral:  Positive for sleep disturbance. The patient is nervous/anxious.    Objective:  BP 100/68 (BP Location: Left Arm, Patient Position: Sitting, Cuff Size: Normal)    Pulse 71    Temp 97.9 F (36.6 C) (Oral)    Wt 135 lb 8 oz (61.5 kg)    LMP 05/15/2021 (Exact Date)    SpO2 99%    BMI 27.37 kg/m   Weight: 135 lb 8 oz (61.5 kg)   BP Readings from Last 3 Encounters:  06/06/21 100/68  03/09/21 110/80  02/13/21 93/79   Wt Readings from Last 3 Encounters:  06/06/21 135 lb 8 oz (61.5 kg)  03/09/21 129 lb 12.8 oz (58.9 kg)  02/05/21 127 lb 9.6 oz (57.9 kg)    Physical Exam Constitutional:      General: She is not in acute distress.    Appearance: She is well-developed.  Cardiovascular:     Rate and Rhythm: Normal rate and regular rhythm.     Heart sounds: Normal heart sounds. No murmur heard.   No friction rub.  Pulmonary:     Effort: Pulmonary effort is normal. No respiratory distress.     Breath sounds: Normal breath sounds. No wheezing or rales.  Abdominal:     General: Bowel sounds are normal. There is distension (mild).     Palpations: Abdomen is soft.     Tenderness: There is no abdominal tenderness.  Musculoskeletal:     Right lower leg: No edema.  Left lower leg: No edema.  Neurological:     Mental Status: She is alert and oriented to person, place, and time.  Psychiatric:        Behavior: Behavior normal.   PHQ9 SCORE ONLY 06/06/2021 08/27/2019 08/27/2019  PHQ-9 Total Score 5 7 7    GAD 7 : Generalized Anxiety Score 06/06/2021  Nervous, Anxious, on Edge 0  Control/stop worrying 0  Worry too much - different things 1  Trouble relaxing 1  Restless 1  Easily annoyed or irritable 1  Afraid - awful might happen 0  Total GAD 7 Score 4      Assessment/Plan 1. Need for immunization against influenza - Flu Vaccine QUAD 6+ mos PF IM (Fluarix Quad PF)  2. Fatigue, unspecified type Multifactorial -we discussed alcohol's role in sleep  disruption. We can try trazodone to aid with sleep as well. Start with bloodwork. Further eval pending results.  - CBC with Differential/Platelet; Future - Comprehensive metabolic panel; Future - TSH; Future - Vitamin B12; Future - Folate; Future - VITAMIN D 25 Hydroxy (Vit-D Deficiency, Fractures); Future - VITAMIN D 25 Hydroxy (Vit-D Deficiency, Fractures) - Folate - Vitamin B12 - TSH - Comprehensive metabolic panel - CBC with Differential/Platelet  3. Hepatic steatosis She is following with liver specialist regularly. They have advised abstinence of alcohol.  - Lipid panel; Future - Lipid panel  4. Alcohol abuse She is not ready to quit drinking, but is ready to cut back. She was asking for medication to help her with cutting back on alcohol,but we discussed that medications used for treatment with alcohol use (outside of medications for mood) are intended for use with no alcohol in system. Will continue to discuss. I encouraged her to talk with her husband as it is helpful for her to have support with cutting back, and they tend to drink together. In the same way, being up front with friends about her goals and considering how to handle social situations to plan ahead for limiting drinking will help with her success. We discussed plan for cutting back week day drinking as well as weekend drinking and setting goals to get to lower weekly alcohol use.  5. Anxiety Does have some baseline anxiety. Has had difficult time with sexual side effects with zoloft in the past. Has also tried hydralazine.   6. Hyperglycemia - Hemoglobin A1c; Future - Hemoglobin A1c  7. Insomnia, unspecified type - traZODone (DESYREL) 50 MG tablet; Take 0.5-1 tablets (25-50 mg total) by mouth at bedtime as needed for sleep.  Dispense: 30 tablet; Refill: 2   Can discuss further options for weight loss pending lab results.     Micheline Rough, MD

## 2021-06-12 ENCOUNTER — Encounter: Payer: Self-pay | Admitting: Family Medicine

## 2021-06-13 MED ORDER — VITAMIN D (ERGOCALCIFEROL) 1.25 MG (50000 UNIT) PO CAPS: 50000.0000 [IU] | ORAL_CAPSULE | ORAL | 0 refills | Status: DC

## 2021-06-13 NOTE — Addendum Note (Signed)
Addended by: Johnella Moloney on: 06/13/2021 03:19 PM   Modules accepted: Orders

## 2021-06-14 DIAGNOSIS — J029 Acute pharyngitis, unspecified: Secondary | ICD-10-CM | POA: Diagnosis not present

## 2021-06-14 DIAGNOSIS — L739 Follicular disorder, unspecified: Secondary | ICD-10-CM | POA: Diagnosis not present

## 2021-06-19 DIAGNOSIS — M6281 Muscle weakness (generalized): Secondary | ICD-10-CM | POA: Diagnosis not present

## 2021-06-19 DIAGNOSIS — R3982 Chronic bladder pain: Secondary | ICD-10-CM | POA: Diagnosis not present

## 2021-06-19 DIAGNOSIS — R102 Pelvic and perineal pain: Secondary | ICD-10-CM | POA: Diagnosis not present

## 2021-07-18 ENCOUNTER — Encounter: Payer: Self-pay | Admitting: Family Medicine

## 2021-07-18 DIAGNOSIS — E663 Overweight: Secondary | ICD-10-CM

## 2021-07-18 DIAGNOSIS — R739 Hyperglycemia, unspecified: Secondary | ICD-10-CM

## 2021-07-24 NOTE — Addendum Note (Signed)
Addended by: Wynn Banker on: 07/24/2021 02:39 PM   Modules accepted: Orders

## 2021-08-06 ENCOUNTER — Other Ambulatory Visit: Payer: Self-pay | Admitting: Family Medicine

## 2021-08-06 DIAGNOSIS — N898 Other specified noninflammatory disorders of vagina: Secondary | ICD-10-CM | POA: Diagnosis not present

## 2021-08-06 DIAGNOSIS — N760 Acute vaginitis: Secondary | ICD-10-CM | POA: Diagnosis not present

## 2021-08-06 DIAGNOSIS — L292 Pruritus vulvae: Secondary | ICD-10-CM | POA: Diagnosis not present

## 2021-08-21 ENCOUNTER — Encounter: Payer: Self-pay | Admitting: Family Medicine

## 2021-08-21 ENCOUNTER — Encounter: Payer: Self-pay | Admitting: Skilled Nursing Facility1

## 2021-08-21 ENCOUNTER — Other Ambulatory Visit: Payer: Self-pay

## 2021-08-21 ENCOUNTER — Encounter: Payer: BC Managed Care – PPO | Attending: Family Medicine | Admitting: Skilled Nursing Facility1

## 2021-08-21 DIAGNOSIS — R7303 Prediabetes: Secondary | ICD-10-CM | POA: Insufficient documentation

## 2021-08-21 DIAGNOSIS — E663 Overweight: Secondary | ICD-10-CM | POA: Diagnosis not present

## 2021-08-21 NOTE — Progress Notes (Signed)
Medical Nutrition Therapy  ?Appointment Start time:  9:10  Appointment End time:  10:00 ? ?Primary concerns today: to lose weight   ?Referral diagnosis: r73.9 ?Preferred learning style: auditory, visual ?Learning readiness: change in progress ? ? ?NUTRITION ASSESSMENT  ? ?Clinical ?Medical Hx: GDM, depression, hepatitis  ?Medications: once weekly vitamin D  ?Labs: A1C 5.8, ALT 51, vitamin D 13.95 ?Notable Signs/Symptoms: some constipation  ? ?Lifestyle & Dietary Hx ? ?Pt states she has a body comp scale at home. ?Body fat% 28.3 ?TBW 49.2% ? ?Pt states she has been ding keto for about 2 months. Pt states she has cut out all carbs in her diet and has been tracking her food for about 1 month and has lost 10 pounds. Pt states she wants to get to about 115 pounds. Pt states her weight has been about 130-135 pounds as an adult. Pt states she had a hole in her bladder.  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  ? ?Estimated daily fluid intake: aiming for 64 but landing about 40-50 oz ?Supplements: vitamin D ?Sleep: prescribed a medicine to help her go to sleep ?Stress / self-care: a couple of glasses of wine to help calm her when stressed with work  ?Current average weekly physical activity: gym 7am-8am weights and core 3 days a week ? ?24-Hr Dietary Recall: wakes 6am ?First Meal 8:30am: skipped coffee + heavy cream ?Snack:  ?Second Meal 12-1 pm smoked salmon + crackers or eggs and bacon ?Snack:  ?Third Meal 6pm: steak or chicken or pork + spaghetti squash or lemon chicken ?Snack:  ?Beverages: water, wine ? ?Estimated Energy Needs ?Calories: 1600 ? ? ?NUTRITION DIAGNOSIS  ?NB-1.1 Food and nutrition-related knowledge deficit As related to no prior education from a nutrition professional.  As evidenced by 24 hr recall, excessive fat and protein intake. ? ?Education topics: ? ?When should you have a bowel movement?: Everyone has their own bowel regimen, some people have a few bowel movements a  day whereas other may go every 2-3 days. According to the [employer] for Gastrointestinal Disorders, most people have 3 bowel movements a week and anything less can be alerting. When referring to the [facility] Stool Chart; type 1-2 can indicate constipation, 3-4 are ideal, and 5-7 may indicate diarrhea.  ?https://aboutconstipation.org/stool-form-guide.html, AdvisorRank.be.pdf ? ?Fiber recommendations: Adults need anywhere from 20-30 grams of fiber daily to ensure adequate intake. Both kinds of fiber, insoluble and soluble, provide health benefits to the body. Insoluble fiber does not dissolve in water and thus will help move digested food through the GI tract, promoting regular bowel movements and preventing constipation. Soluble fiber does dissolve in water and will help lower blood glucose and cholesterol levels. An increased intake of fiber may decrease the risk for cardiovascular disease and will help people with type 2 diabetes control their blood glucose levels.  ?CropWizard.com.pt, SkincareIndustry.com.pt ?Why physical activity is needed:  Physical activity has various benefits for the body such as reducing stress, helping control blood glucose levels, strengthening the heart, reducing incidence of cognitive impairment, and has a neuroprotective effect on memory function. Research reports that physical activity (whether it be aerobic, resistance training, or high intensity interval training) help control the level of reactive oxygen species which can lead to oxidative stress on the brain therefor possibly impairing memory, learning abilities, and other cognitive functions. The CDC recognizes that physical activity can help prevent premature death, may prevent the development of type 2 diabetes, different cancers, and  heart disease. Not  only are there are health benefits to physical activity, the CDC also reports that physical activity would decrease health care costs, increase property values, and people who are physically active generally have less sick days. ?Resource https://www-sciencedirect-com.libproxy.MarathonSkier.co.uk, CDC ?Why you need complex carbohydrates: Whole grains and other complex carbohydrates are required to have a healthy diet. Whole grains provide fiber which can help with blood glucose levels and help keep you satiated. Fruits and starchy vegetables provide essential vitamins and minerals required for immune function, eyesight support, brain support, bone density, wound healing and many other functions within the body. According to the current evidenced based 2020-2025 Dietary Guidelines for Americans, complex carbohydrates are part of a healthy eating pattern which is associated with a decreased risk for type 2 diabetes, cancers, and cardiovascular disease.  ? ? ?NUTRITION INTERVENTION  ?Nutrition education (E-1) on the following topics:  ?Renal health within the context of nitrogen waste and under hydration ?Adipose tissue mobilization and cardiovascular activity ?Gut microbiota health within the context of prebiotics  ?Appropriate BMI for one of asian decent  ?Creation of balanced meals ?Macronutrient distribution  ? ?Handouts Provided Include  ?Detailed Myplate ? ?Learning Style & Readiness for Change ?Teaching method utilized: Visual & Auditory  ?Demonstrated degree of understanding via: Teach Back  ?Barriers to learning/adherence to lifestyle change: preconceived notions of carbohydrates usage in the human body  ? ?Goals Established by Pt ?Macronutrient distribution: 45% CHO, 25% protein, 30 % fat and if not the desired results try 40% CHO, 30% protein, 30% fat ?Increase your complex carbohydrates  ?Increase your non starchy vegetables to 1 full cup with lunch and dinner daily ?Increase  fluid ounces to a minimum 64 ounces per day ? ?MONITORING & EVALUATION ?Dietary intake, weekly physical activity ? ?Next Steps  ?Patient is to call or e-mail as needed. ?

## 2021-09-26 ENCOUNTER — Ambulatory Visit: Payer: BC Managed Care – PPO | Admitting: Family Medicine

## 2021-09-26 VITALS — BP 127/80 | HR 62 | Temp 98.5°F | Wt 126.4 lb

## 2021-09-26 DIAGNOSIS — H6982 Other specified disorders of Eustachian tube, left ear: Secondary | ICD-10-CM | POA: Diagnosis not present

## 2021-09-26 DIAGNOSIS — S93602A Unspecified sprain of left foot, initial encounter: Secondary | ICD-10-CM | POA: Diagnosis not present

## 2021-09-26 DIAGNOSIS — H9202 Otalgia, left ear: Secondary | ICD-10-CM | POA: Diagnosis not present

## 2021-09-26 DIAGNOSIS — M79672 Pain in left foot: Secondary | ICD-10-CM

## 2021-09-26 NOTE — Patient Instructions (Signed)
Your cam boot or other supportive shoes to keep your foot flat from bending while it is healing. ? ? ?

## 2021-09-26 NOTE — Progress Notes (Signed)
Subjective:  ? ? Patient ID: Kim Pacheco, female    DOB: Oct 28, 1985, 36 y.o.   MRN: [id number] ? ?Chief Complaint  ?Patient presents with  ? Foot Injury  ?  Larey Seat last week playing soccer with son. Left ankle bent and landed on top of foot. No ankle pain, just foot pain  ? Ear Pain  ?  States she has otoscope at home and ear looked red  ? ? ?HPI ?Patient was seen today for acute concern.  Patient endorses rolling left ankle and landing on top of left foot while playing soccer with her son.  Endorses limping immediately after accident, then able to bear weight.  Patient denies significant edema.  Pain of left lateral hindfoot with palpation.  Tried ice, elevation, and Advil.  Patient able to walk without pain.  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 ? ?Patient also endorses left ear pain starting yesterday.  Has otoscope at home and endorses seeing redness TM. ? ?Past Medical History:  ?Diagnosis Date  ? Abdominal pain   ? started in mid abd and spread to left side/on and off/since Jan 2020/ pt states, she has fluid in abd/unknown  ? Abnormal Pap smear   ? normal 06/2013  ? Anxiety   ? Constipation   ? hx of  ? Depression   ? suicide attempt in college  ? Diabetes mellitus without complication (HCC)   ? gestational, no issues since pregnancy per her report - resolved  ? Gestational diabetes   ? /no meds now - resolved  ? Hepatitis   ? Hep B, managed by Dr. Kinnie Scales  ? History of chicken pox   ? History of suicide attempt 2003  ? Attempt in college  ? History of UTI   ? Vaginal Pap smear, abnormal   ? ? ?Allergies  ?Allergen Reactions  ? Contrast Media [Iodinated Contrast Media] Anaphylaxis, Shortness Of Breath and Rash  ?  IV contrast ?  ? Nsaids Other (See Comments)  ?  Hx gastritis & gastric perforation   ? Prednisone & Diphenhydramine Other (See Comments)  ?  Don't like to take it  ? ? ?ROS ?General: Denies fever, chills, night sweats, changes in weight, changes in  appetite ?HEENT: Denies headaches, ear pain, changes in vision, rhinorrhea, sore throat + left otalgia ?CV: Denies CP, palpitations, SOB, orthopnea ?Pulm: Denies SOB, cough, wheezing ?GI: Denies abdominal pain, nausea, vomiting, diarrhea, constipation ?GU: Denies dysuria, hematuria, frequency, vaginal discharge ?Msk: Denies muscle cramps, joint pains + left foot pain ?Neuro: Denies weakness, numbness, tingling ?Skin: Denies rashes, bruising ?Psych: Denies depression, anxiety, hallucinations ? ?   ?Objective:  ?  ?Blood pressure 127/80, pulse 62, temperature 98.5 ?F (36.9 ?C), temperature source Oral, weight 126 lb 6.4 oz (57.3 kg), SpO2 100 %. ? ?Gen. Pleasant, well-nourished, in no distress, normal affect   ?HEENT: Stacy/AT, face symmetric, conjunctiva clear, no scleral icterus, PERRLA, EOMI, nares patent without drainage ?Lungs: no accessory muscle use, CTAB, no wheezes or rales ?Cardiovascular: RRR, no peripheral edema ?Musculoskeletal: B/l LEs normal in appearance. Pt wearing flip flops.  Mild, subtle area of edema at L lateral 5th metatarsal proximal to ankle, TTP.  No ankle instability b/l.  No pain in foot with inversion, eversion, flexion of toes.  Extension of toes causes discomfort in left lateral foot.  No pain in L lateral foot with vibration of tuning fork along 5th metatarsal.  No deformities, no cyanosis or clubbing, normal  tone ?Neuro:  A&Ox3, CN II-XII intact, normal gait ?Skin:  Warm, no lesions/ rash ? ? ?Wt Readings from Last 3 Encounters:  ?08/21/21 121 lb 6.4 oz (55.1 kg)  ?06/06/21 135 lb 8 oz (61.5 kg)  ?03/09/21 129 lb 12.8 oz (58.9 kg)  ? ? ?Lab Results  ?Component Value Date  ? WBC 5.3 06/06/2021  ? HGB 13.9 06/06/2021  ? HCT 41.8 06/06/2021  ? PLT 217.0 06/06/2021  ? GLUCOSE 105 (H) 06/06/2021  ? CHOL 192 06/06/2021  ? TRIG 144.0 06/06/2021  ? HDL 64.10 06/06/2021  ? LDLCALC 99 06/06/2021  ? ALT 51 (H) 06/06/2021  ? AST 33 06/06/2021  ? NA 137 06/06/2021  ? K 4.3 06/06/2021  ? CL 101  06/06/2021  ? CREATININE 0.62 06/06/2021  ? BUN 11 06/06/2021  ? CO2 27 06/06/2021  ? TSH 0.86 06/06/2021  ? INR 1.0 05/07/2020  ? HGBA1C 5.8 06/06/2021  ? ? ?Assessment/Plan: ? ?Left foot pain ?-acute  ?-s/p fall causing injury ?-symptoms likely 2/2 sprain.  Fracture less likely both to be considered for continued/worsening symptoms ?-continue expectant management with ice, heat, compression, elevation. ?-Imaging not indicated at this time. 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 ?-discussed likely duration of symptoms ?-Given precautions for increased/worsening symptoms.   ? 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 ? ?Left ear pain ?-Bilateral TMs normal on exam. ?-Photos TM from pt reviewed and normal in appearance ?-likely 2/2 eustachian tube dysfunction ?-Discussed expectant management ibuprofen as needed flank pain/discomfort ?-Antihistamine or Flonase nasal spray for eustachian tube dysfunction. ?-Given precautions ? ?Dysfunction of left eustachian tube ?-Likely causing acute left otalgia ?-Start antihistamine or Flonase nasal spray ?-Given precautions ? ?F/u prn ? ?Abbe Amsterdam, MD ?

## 2021-09-27 ENCOUNTER — Other Ambulatory Visit: Payer: Self-pay | Admitting: Family Medicine

## 2021-10-01 ENCOUNTER — Encounter: Payer: Self-pay | Admitting: Family Medicine

## 2021-10-02 ENCOUNTER — Other Ambulatory Visit: Payer: Self-pay | Admitting: Family Medicine

## 2021-10-02 NOTE — Telephone Encounter (Signed)
This refill request reminded me I was going to send her that 1500 cal diet plan I had printed. Can you mail her a copy? Thanks! ?

## 2021-10-08 NOTE — Telephone Encounter (Signed)
Info via [employer] from [employer] 1500 calorie diet mailed to the patient's home address. ?

## 2021-11-05 IMAGING — CT CT ABD-PELV W/O CM
2 of 4 series · 16 of 46 positions shown, 18 images · non-contrast
Comparison: CT Abdomen and Pelvis 05/07/2020 and earlier.

CLINICAL DATA: 35-year-old female who woke with abdominal pain.
Chronic hepatitis-B.

Status post intraperitoneal bladder rupture last year with
subsequent exploratory laparotomy and bladder repair in [REDACTED].
EXAM:
CT ABDOMEN AND PELVIS WITHOUT CONTRAST
TECHNIQUE: Multidetector CT imaging of the abdomen and pelvis was performed
following the standard protocol without IV contrast.

[Series 2: axial st · axial · 0.62mm/px · z∈[+1142,+1487]mm · 13 of 79 slices shown, 15 images]
[im 5/79  soft-tissue]
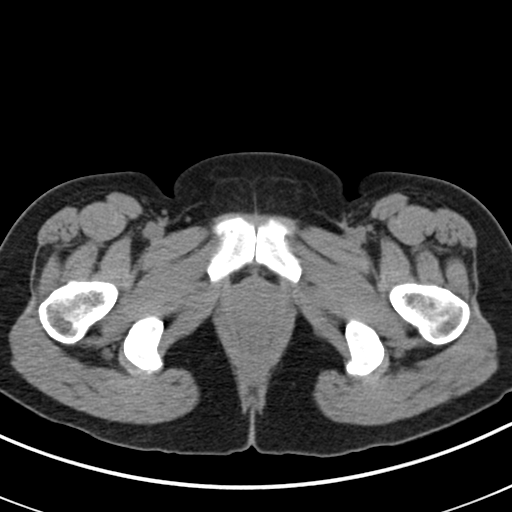
[im 5/79  bone]
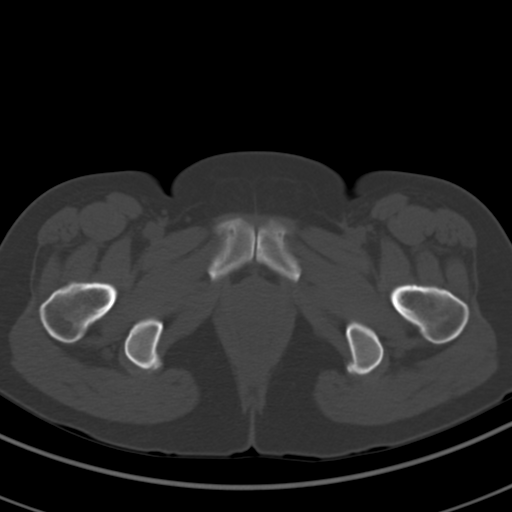
[im 13/79  soft-tissue]
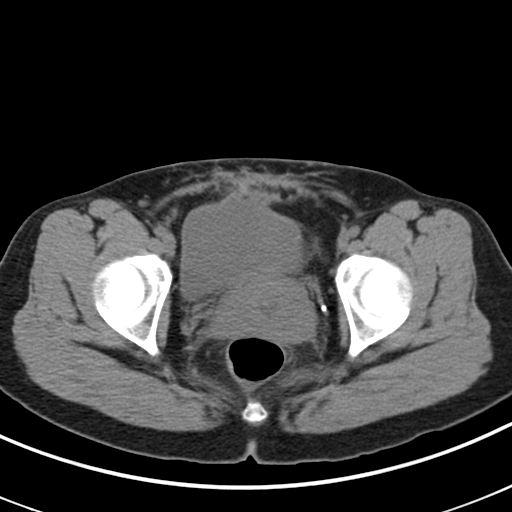
[im 17/79  soft-tissue]
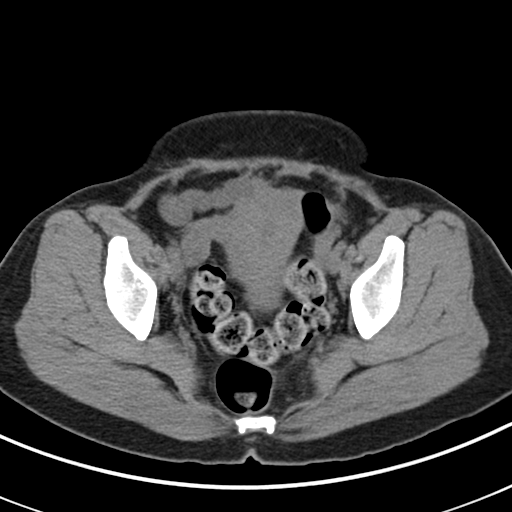
[im 21/79  soft-tissue]
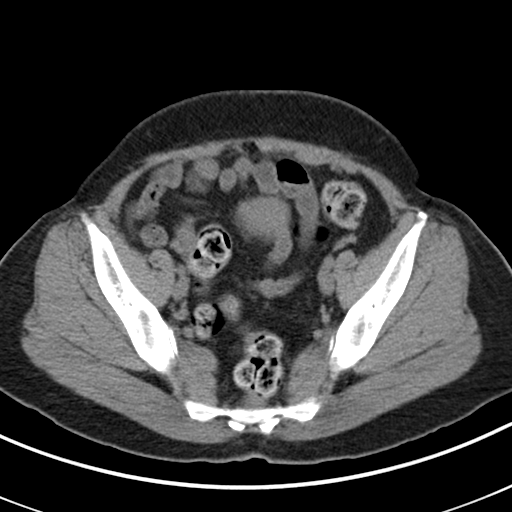
[im 29/79  soft-tissue]
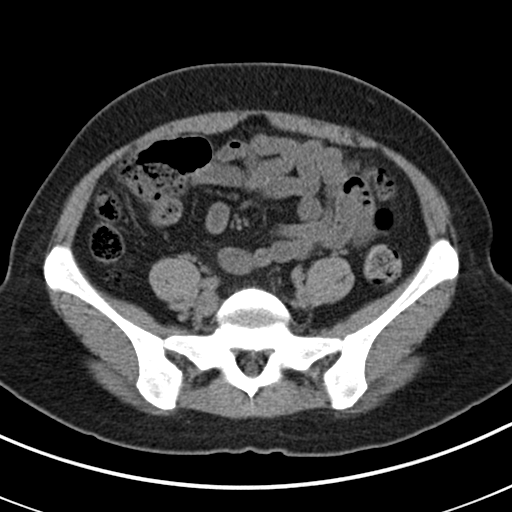
[im 33/79  soft-tissue]
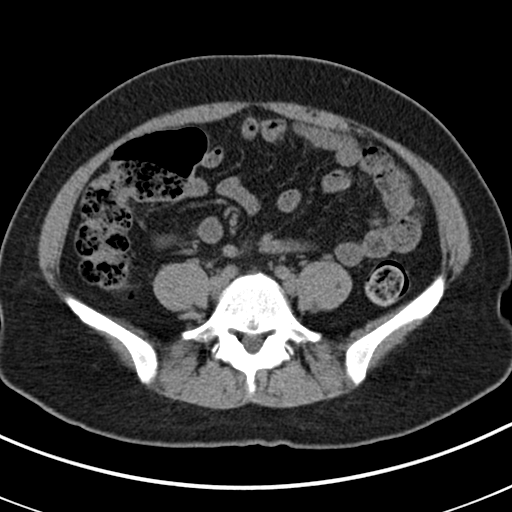
[im 42/79  soft-tissue]
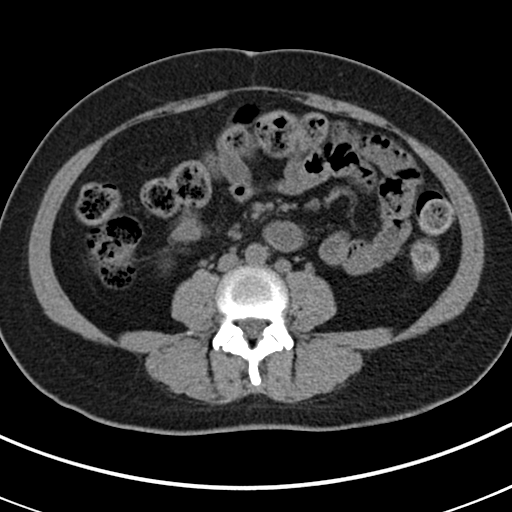
[im 46/79  soft-tissue]
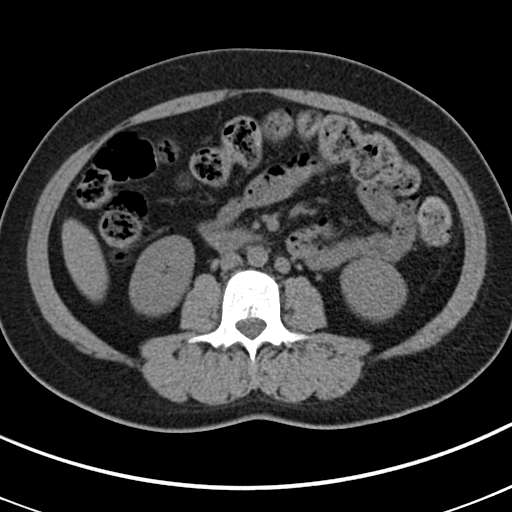
[im 50/79  soft-tissue]
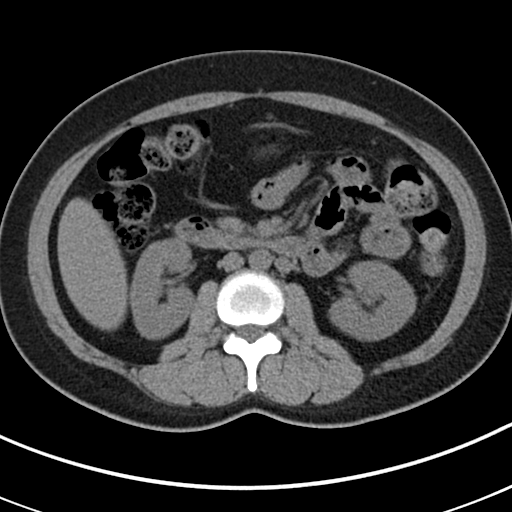
[im 50/79  bone]
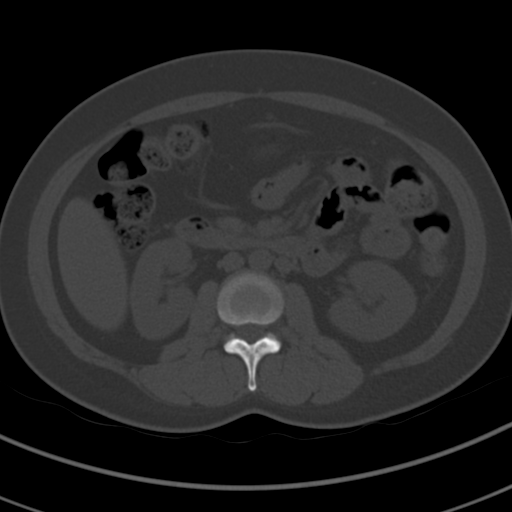
[im 58/79  soft-tissue]
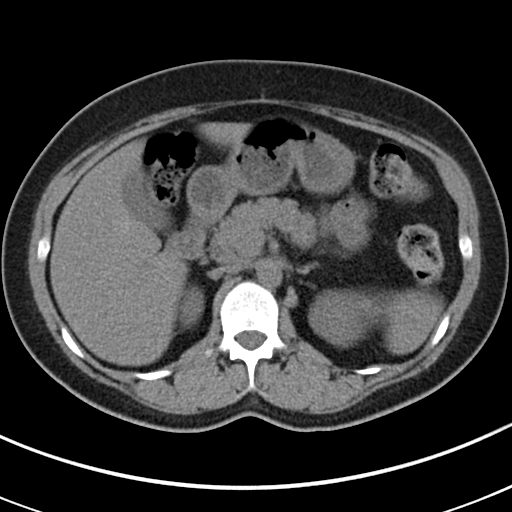
[im 62/79  soft-tissue]
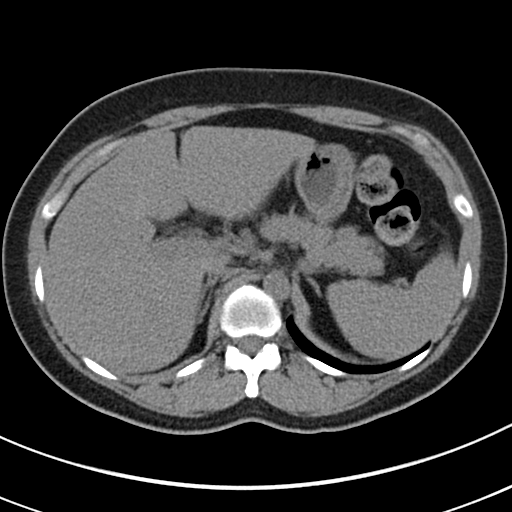
[im 66/79  soft-tissue]
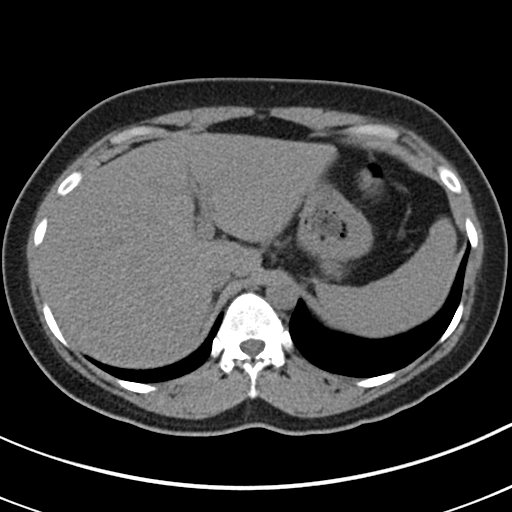
[im 74/79  soft-tissue]
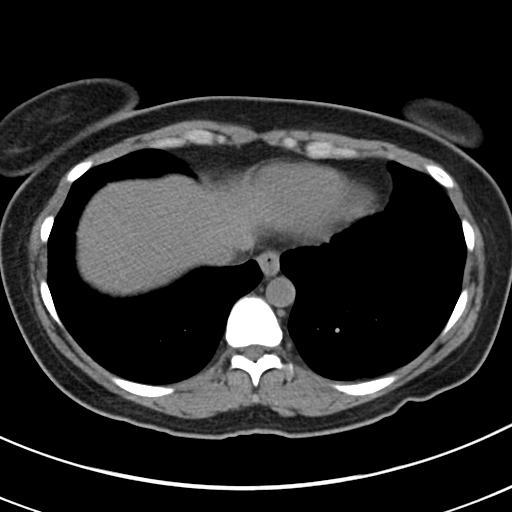

[Series 4: coronal st · coronal · 0.63mm/px · 3 of 122 slices shown]
[im 41/122  soft-tissue]
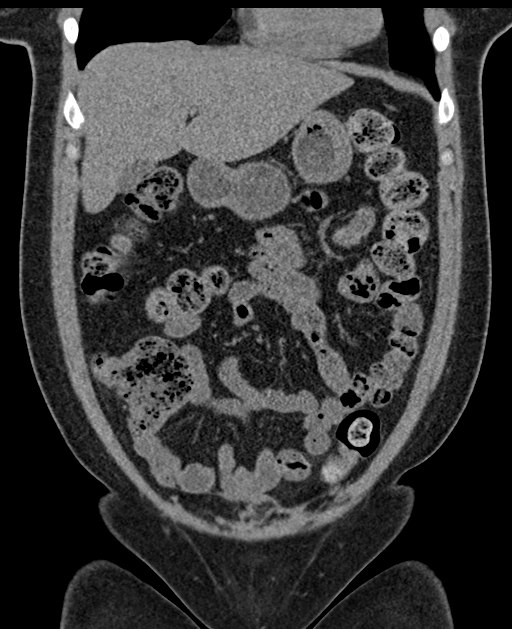
[im 54/122  soft-tissue]
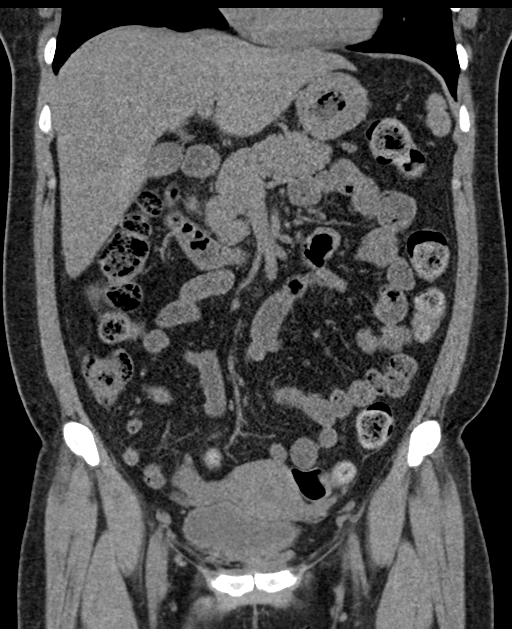
[im 68/122  soft-tissue]
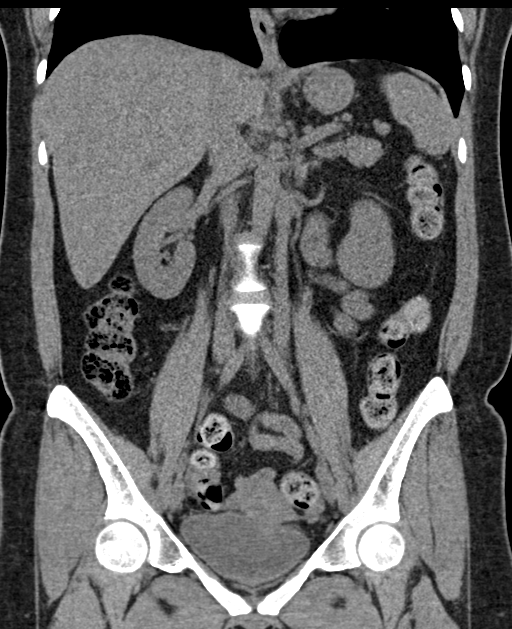

[16 of 46 positions shown; findings below may reference images not displayed]

FINDINGS: Lower chest: Negative.

Hepatobiliary: Partially contracted gallbladder today. Negative
noncontrast liver.

Pancreas: Negative.

Spleen: Negative.  No splenomegaly.

Adrenals/Urinary Tract: Normal adrenal glands. Noncontrast kidneys
appear stable and nonobstructed. No nephrolithiasis or pararenal
inflammation. Both ureters appear decompressed.

Somewhat diminutive urinary bladder with acute versus chronic soft
tissue stranding in the space of Retzius, contiguous with the
anterior bladder wall and bladder dome (series 2, image 68 and
sagittal image 74). But no other perivesical stranding. No
perivesical fluid. Occasional pelvic phleboliths are stable.

Stomach/Bowel: Negative large bowel aside from retained stool
throughout and some redundancy. Normal appendix on series 2, image
54. Terminal ileum is within normal limits. No dilated small bowel.
Stomach and duodenum appear negative. No free air or free fluid.

Vascular/Lymphatic: Normal caliber abdominal aorta. No
lymphadenopathy.

Reproductive: Noncontrast appearance is within normal limits.

Other: No pelvic free fluid today.

Musculoskeletal: Lower thoracic posterior element hypertrophy and
calcification at T11-T12 greater on the left. No acute osseous
abnormality identified.
IMPRESSION: 1. Probable postoperative soft tissue stranding in the Space of
Retzius anterior to the bladder. No obstructive uropathy or other
urinary abnormality on this non-contrast exam.
2. No other acute or inflammatory process. Normal appendix. Retained
stool in the colon.

## 2021-11-22 ENCOUNTER — Other Ambulatory Visit: Payer: Self-pay

## 2021-11-22 ENCOUNTER — Encounter (HOSPITAL_BASED_OUTPATIENT_CLINIC_OR_DEPARTMENT_OTHER): Payer: Self-pay | Admitting: Emergency Medicine

## 2021-11-22 ENCOUNTER — Emergency Department (HOSPITAL_BASED_OUTPATIENT_CLINIC_OR_DEPARTMENT_OTHER): Payer: BC Managed Care – PPO | Admitting: Radiology

## 2021-11-22 ENCOUNTER — Telehealth: Payer: BC Managed Care – PPO | Admitting: Physician Assistant

## 2021-11-22 ENCOUNTER — Emergency Department (HOSPITAL_BASED_OUTPATIENT_CLINIC_OR_DEPARTMENT_OTHER): Payer: BC Managed Care – PPO

## 2021-11-22 ENCOUNTER — Emergency Department (HOSPITAL_BASED_OUTPATIENT_CLINIC_OR_DEPARTMENT_OTHER)
Admission: EM | Admit: 2021-11-22 | Discharge: 2021-11-22 | Disposition: A | Discharge status: Home / Self Care | Payer: BC Managed Care – PPO | Attending: Emergency Medicine | Admitting: Emergency Medicine

## 2021-11-22 DIAGNOSIS — R42 Dizziness and giddiness: Secondary | ICD-10-CM | POA: Insufficient documentation

## 2021-11-22 DIAGNOSIS — R1032 Left lower quadrant pain: Secondary | ICD-10-CM | POA: Diagnosis not present

## 2021-11-22 DIAGNOSIS — R1031 Right lower quadrant pain: Secondary | ICD-10-CM | POA: Insufficient documentation

## 2021-11-22 DIAGNOSIS — R1010 Upper abdominal pain, unspecified: Secondary | ICD-10-CM | POA: Diagnosis not present

## 2021-11-22 DIAGNOSIS — R0602 Shortness of breath: Secondary | ICD-10-CM | POA: Insufficient documentation

## 2021-11-22 DIAGNOSIS — R079 Chest pain, unspecified: Secondary | ICD-10-CM | POA: Diagnosis not present

## 2021-11-22 DIAGNOSIS — R0789 Other chest pain: Secondary | ICD-10-CM | POA: Diagnosis not present

## 2021-11-22 DIAGNOSIS — R109 Unspecified abdominal pain: Secondary | ICD-10-CM | POA: Diagnosis not present

## 2021-11-22 LAB — CBC
HCT: 41 % (ref 36.0–46.0)
Hemoglobin: 13.5 g/dL (ref 12.0–15.0)
MCH: 29.9 pg (ref 26.0–34.0)
MCHC: 32.9 g/dL (ref 30.0–36.0)
MCV: 90.9 fL (ref 80.0–100.0)
Platelets: 192 10*3/uL (ref 150–400)
RBC: 4.51 MIL/uL (ref 3.87–5.11)
RDW: 12.5 % (ref 11.5–15.5)
WBC: 5.1 10*3/uL (ref 4.0–10.5)
nRBC: 0 % (ref 0.0–0.2)

## 2021-11-22 LAB — URINALYSIS, ROUTINE W REFLEX MICROSCOPIC
Bilirubin Urine: NEGATIVE
Glucose, UA: NEGATIVE mg/dL
Hgb urine dipstick: NEGATIVE
Ketones, ur: NEGATIVE mg/dL
Leukocytes,Ua: NEGATIVE
Nitrite: NEGATIVE
Specific Gravity, Urine: 1.032 — ABNORMAL HIGH (ref 1.005–1.030)
pH: 5.5 (ref 5.0–8.0)

## 2021-11-22 LAB — BASIC METABOLIC PANEL
Anion gap: 9 (ref 5–15)
BUN: 17 mg/dL (ref 6–20)
CO2: 28 mmol/L (ref 22–32)
Calcium: 10.4 mg/dL — ABNORMAL HIGH (ref 8.9–10.3)
Chloride: 101 mmol/L (ref 98–111)
Creatinine, Ser: 0.69 mg/dL (ref 0.44–1.00)
GFR, Estimated: >60 mL/min (ref >60)
Glucose, Bld: 131 mg/dL — ABNORMAL HIGH (ref 70–99)
Potassium: 4.5 mmol/L (ref 3.5–5.1)
Sodium: 138 mmol/L (ref 135–145)

## 2021-11-22 LAB — LIPASE, BLOOD: Lipase: 45 U/L (ref 11–51)

## 2021-11-22 LAB — HEPATIC FUNCTION PANEL
ALT: 23 U/L (ref 0–44)
AST: 27 U/L (ref 15–41)
Albumin: 4.3 g/dL (ref 3.5–5.0)
Alkaline Phosphatase: 30 U/L — ABNORMAL LOW (ref 38–126)
Bilirubin, Direct: 0.1 mg/dL (ref 0.0–0.2)
Indirect Bilirubin: 0.4 mg/dL (ref 0.3–0.9)
Total Bilirubin: 0.5 mg/dL (ref 0.3–1.2)
Total Protein: 7.4 g/dL (ref 6.5–8.1)

## 2021-11-22 LAB — TROPONIN I (HIGH SENSITIVITY)
Troponin I (High Sensitivity): <2 ng/L (ref <18)
Troponin I (High Sensitivity): <2 ng/L (ref <18)

## 2021-11-22 LAB — PREGNANCY, URINE: Preg Test, Ur: NEGATIVE

## 2021-11-22 LAB — D-DIMER, QUANTITATIVE: D-Dimer, Quant: 0.34 ug/mL-FEU (ref 0.00–0.50)

## 2021-11-22 MED ORDER — PANTOPRAZOLE SODIUM 40 MG PO TBEC: 40.0000 mg | DELAYED_RELEASE_TABLET | Freq: Every day | ORAL | 0 refills | Status: DC

## 2021-11-22 MED ORDER — SODIUM CHLORIDE 0.9 % IV BOLUS
1000.0000 mL | Freq: Once | INTRAVENOUS | Status: AC
  Administered 2021-11-22: 1000 mL via INTRAVENOUS

## 2021-11-22 NOTE — ED Provider Notes (Signed)
[facility] EMERGENCY DEPT Provider Note   CSN: [id number] Arrival date & time: 11/22/21  1344     History  Chief Complaint  Patient presents with   Chest Pain    Kim Pacheco is a 36 y.o. female.  HPI 36 year old female presents with chest pain and shortness of breath.  She was sent here from urgent care.  She has been dealing with chest pressure on and off for 3 days.  She mostly notices it when she is sitting still.  When she is walking upstairs or walking around she does not notice it.  Developed shortness of breath and some lightheadedness today.  She was getting lightheaded when taking deep breath at urgent care.  She states she was also told she might have pancreatitis.  She has been dealing with a little bit of lower abdominal pain that she describes as a sharp but 2 out of 10 pain in her bilateral lower abdomen for 1 week.  No urinary symptoms or vaginal symptoms.  Home Medications Prior to Admission medications   Medication Sig Start Date End Date Taking? Authorizing Provider  pantoprazole (PROTONIX) 40 MG tablet Take 1 tablet (40 mg total) by mouth daily. 11/22/21  Yes Pricilla Loveless, MD  tenofovir (VIREAD) 300 MG tablet Take 300 mg by mouth daily.    [provider]  traZODone (DESYREL) 50 MG tablet Take 0.5-1 tablets (25-50 mg total) by mouth at bedtime as needed for sleep. 06/06/21   Wynn Banker, MD  Vitamin D, Ergocalciferol, (DRISDOL) 1.25 MG (50000 UNIT) CAPS capsule TAKE ONE CAPSULE BY MOUTH ONCE WEEKLY 10/02/21   Wynn Banker, MD      Allergies    Contrast media [iodinated contrast media], Nsaids, and Prednisone & diphenhydramine    Review of Systems   Review of Systems  Respiratory:  Positive for shortness of breath.   Cardiovascular:  Positive for chest pain. Negative for leg swelling.  Gastrointestinal:  Positive for abdominal pain.  Genitourinary:  Negative for dysuria and vaginal bleeding.    Physical  Exam Updated Vital Signs BP (!) 147/88 (BP Location: Left Arm) Comment: Simultaneous filing. User may not have seen previous data.  Pulse 75 Comment: Simultaneous filing. User may not have seen previous data.  Temp 98.1 F (36.7 C)   Resp 13 Comment: Simultaneous filing. User may not have seen previous data.  Ht 4\' 11"  (1.499 m)   Wt 56.2 kg   LMP 11/10/2021 Comment: negative U-preg 11/22/21  SpO2 100% Comment: Simultaneous filing. User may not have seen previous data.  BMI 25.04 kg/m  Physical Exam Vitals and nursing note reviewed.  Constitutional:      Appearance: She is well-developed.  HENT:     Head: Normocephalic and atraumatic.  Cardiovascular:     Rate and Rhythm: Normal rate and regular rhythm.     Heart sounds: Murmur (soft murmur appreciated) heard.  Pulmonary:     Effort: Pulmonary effort is normal.     Breath sounds: Normal breath sounds.  Abdominal:     Palpations: Abdomen is soft.     Tenderness: There is abdominal tenderness (mild) in the right lower quadrant and left lower quadrant.  Musculoskeletal:     Right lower leg: No tenderness. No edema.     Left lower leg: No tenderness. No edema.  Skin:    General: Skin is warm and dry.  Neurological:     Mental Status: She is alert.     ED Results /  Procedures / Treatments   Labs (all labs ordered are listed, but only abnormal results are displayed) Labs Reviewed  BASIC METABOLIC PANEL - Abnormal; Notable for the following components:      Result Value   Glucose, Bld 131 (*)    Calcium 10.4 (*)    All other components within normal limits  HEPATIC FUNCTION PANEL - Abnormal; Notable for the following components:   Alkaline Phosphatase 30 (*)    All other components within normal limits  URINALYSIS, ROUTINE W REFLEX MICROSCOPIC - Abnormal; Notable for the following components:   Color, Urine ORANGE (*)    APPearance CLOUDY (*)    Specific Gravity, Urine 1.032 (*)    Protein, ur TRACE (*)    All other  components within normal limits  CBC  PREGNANCY, URINE  LIPASE, BLOOD  D-DIMER, QUANTITATIVE  TROPONIN I (HIGH SENSITIVITY)  TROPONIN I (HIGH SENSITIVITY)    EKG EKG Interpretation  Date/Time:  Thursday November 22 2021 13:55:18 EDT Ventricular Rate:  73 PR Interval:  146 QRS Duration: 72 QT Interval:  396 QTC Calculation: 436 R Axis:   62 Text Interpretation: Normal sinus rhythm Nonspecific T wave abnormality Abnormal ECG When compared with ECG of 08-May-2020 17:03, No significant change was found Confirmed by Meridee Score [id number]) on 11/22/2021 1:59:21 PM  Radiology CT ABDOMEN PELVIS WO CONTRAST  Result Date: 11/22/2021 CLINICAL DATA:  Left lower quadrant abdominal pain. EXAM: CT ABDOMEN AND PELVIS WITHOUT CONTRAST TECHNIQUE: Multidetector CT imaging of the abdomen and pelvis was performed following the standard protocol without IV contrast. RADIATION DOSE REDUCTION: This exam was performed according to the departmental dose-optimization program which includes automated exposure control, adjustment of the mA and/or kV according to patient size and/or use of iterative reconstruction technique. COMPARISON:  CT abdomen pelvis dated 02/13/2021. FINDINGS: Evaluation of this exam is limited in the absence of intravenous contrast. Lower chest: The visualized lung bases are clear. No intra-abdominal free air or free fluid. Hepatobiliary: Fatty liver. No intrahepatic biliary dilatation. Probable tiny gallstones or sludge. 1 no pericholecystic fluid. Pancreas: Unremarkable. No pancreatic ductal dilatation or surrounding inflammatory changes. Spleen: Normal in size without focal abnormality. Adrenals/Urinary Tract: The adrenal glands unremarkable. The kidneys, visualized ureters, and urinary bladder appear unremarkable. Stomach/Bowel: There is no bowel obstruction or active inflammation. The appendix is normal. Vascular/Lymphatic: The abdominal aorta is unremarkable. The bracketed infrarenal IVC anatomy.  No portal venous gas. There is no adenopathy. Reproductive: The uterus is anteverted.  No adnexal masses. Other: Anterior pelvic wall C-section scar and scarring anterior to the bladder. Musculoskeletal: No acute or significant osseous findings. IMPRESSION: 1. No acute intra-abdominal or pelvic pathology. No bowel obstruction. Normal appendix. 2. Fatty liver. Electronically Signed   By: Elgie Collard M.D.   On: 11/22/2021 20:10   DG Chest 2 View  Result Date: 11/22/2021 CLINICAL DATA:  Chest pain since Monday. Shortness of breath. EXAM: CHEST - 2 VIEW COMPARISON:  05/07/2020 FINDINGS: The cardiac silhouette, mediastinal and hilar contours are within normal limits and stable. The lungs are clear. No pleural effusions or pulmonary lesions. The bony thorax is intact. IMPRESSION: No acute cardiopulmonary findings. Electronically Signed   By: Rudie Meyer M.D.   On: 11/22/2021 14:25    Procedures Procedures    Medications Ordered in ED Medications  sodium chloride 0.9 % bolus 1,000 mL (0 mLs Intravenous Stopped 11/22/21 1940)    ED Course/ Medical Decision Making/ A&P  Medical Decision Making Amount and/or Complexity of Data Reviewed External Data Reviewed: notes. Labs: ordered. Radiology: ordered and independent interpretation performed. ECG/medicine tests: ordered and independent interpretation performed.  Risk Prescription drug management.   Patient presents with chest pain that seems unlikely to be cardiac given no exertional component.  ECG and troponins are unremarkable.  Chest x-ray viewed by myself and no pneumonia.  CT was obtained given the lower abdominal pain I personally viewed/interpreted his images and there is no acute findings.  She does have a history of prior abdominal pathology requiring surgical bladder repair but I think this is less likely today.  Perhaps this is GI, will give Protonix.  However troponins are normal and other labs are  unremarkable besides a slight hypercalcemia and she was given IV fluids.  She is well-appearing.  At this point I think she is stable for discharge home with return precautions.  Of note, I did hear what seem to be a slight murmur though my suspicion that she has an acute valvular emergency is pretty low.  I do not think this is necessarily related and she could use an echo but I do not think needs to be emergent.  We will refer her to cardiology.        Final Clinical Impression(s) / ED Diagnoses Final diagnoses:  Nonspecific chest pain    Rx / DC Orders ED Discharge Orders          Ordered    pantoprazole (PROTONIX) 40 MG tablet  Daily        11/22/21 2029    Ambulatory referral to Cardiology       Comments: If you have not heard from the Cardiology office within the next 72 hours please call [phone].   11/22/21 2030              Pricilla Loveless, MD 11/22/21 2038

## 2021-11-22 NOTE — Discharge Instructions (Addendum)
If you develop recurrent, continued, or worsening chest pain, shortness of breath, fever, vomiting, abdominal or back pain, or any other new/concerning symptoms then return to the ER for evaluation.  

## 2021-11-22 NOTE — ED Notes (Signed)
Reviewed AVS/discharge instruction with patient. Time allotted for and all questions answered. Patient is agreeable for d/c and escorted to ed exit by staff.  

## 2021-11-22 NOTE — Progress Notes (Signed)
Virtual Visit Consent   Kim Pacheco, you are scheduled for a virtual visit with a [name] provider today. Just as with appointments in the office, your consent must be obtained to participate. Your consent will be active for this visit and any virtual visit you may have with one of our providers in the next 365 days. If you have a MyChart account, a copy of this consent can be sent to you electronically.  As this is a virtual visit, video technology does not allow for your provider to perform a traditional examination. This may limit your provider's ability to fully assess your condition. If your provider identifies any concerns that need to be evaluated in person or the need to arrange testing (such as labs, EKG, etc.), we will make arrangements to do so. Although advances in technology are sophisticated, we cannot ensure that it will always work on either your end or our end. If the connection with a video visit is poor, the visit may have to be switched to a telephone visit. With either a video or telephone visit, we are not always able to ensure that we have a secure connection.  By engaging in this virtual visit, you consent to the provision of healthcare and authorize for your insurance to be billed (if applicable) for the services provided during this visit. Depending on your insurance coverage, you may receive a charge related to this service.  I need to obtain your verbal consent now. Are you willing to proceed with your visit today? Kim Pacheco has provided verbal consent on 11/22/2021 for a virtual visit (video or telephone). Kim Pacheco, [place]  Date: 11/22/2021 11:25 AM  Virtual Visit via Video Note   I, Kim Pacheco, connected with  Kim Pacheco  ([id number], 15-Mar-1986) on 11/22/21 at 11:15 AM EDT by a video-enabled telemedicine application and verified that I am speaking with the correct person using two  identifiers.  Location: Patient: Virtual Visit Location Patient: Home Provider: Virtual Visit Location Provider: Home Office   I discussed the limitations of evaluation and management by telemedicine and the availability of in person appointments. The patient expressed understanding and agreed to proceed.    History of Present Illness: Kim Pacheco is a 36 y.o. who identifies as a female who was assigned female at birth, and is being seen today for chest pain/pressure starting on Monday. Noted symptoms on waking Monday morning. Initially thought related to drinking the night before but has continued. Pain/pressure is constant and non-exertional but she does note some mild windedness on occasion that has been going on for a while now. Denies feeling any heartburn or abdominal pain. Denies melena, hematochezia or other change to bowel habits. Denies lightheadedness, dizziness. Does have history of viral hepatitis with portal hypertension, hx alcohol abuse and tobacco use, hx gastritis with perforated ulcer. Has taken Tylenol without any real improvement.    HPI: HPI  Problems:  Patient Active Problem List   Diagnosis Date Noted   Extraperitoneal bladder perforation 05/17/2020   History of recurrent UTI (urinary tract infection) 05/08/2020   Free intraperitoneal air 05/08/2020   Anxiety    Abnormal CT of the abdomen    Hepatic steatosis 05/07/2020   Pneumoperitoneum 05/07/2020   Foley catheter in place 05/07/2020   Anal fissure 05/07/2020   Change in bowel habit 05/07/2020   Periumbilical pain 05/07/2020   Rectal bleeding 05/07/2020   History of gestational diabetes 05/07/2020   History of depression 05/07/2020  Portal hypertension (HCC)    History of urinary retention 04/15/2020   Chronic abdominal pain 04/13/2020   Narcolepsy without cataplexy 02/02/2020   Depression, major, single episode, moderate (HCC) 10/28/2018   Elevated CA-125 08/06/2018   ARF (acute renal  failure) (HCC) 06/25/2018   Alcohol abuse 06/24/2018   Constipation, chronic 06/24/2018   Ascites 06/24/2018   Body mass index (BMI) of 25.0 to 29.9 05/29/2018   Fibrocystic disease of breast 05/29/2018   Metrorrhagia 05/29/2018   Urinary tract infectious disease 05/29/2018   Oral hypoglycemic controlled White classification A2 gestational diabetes mellitus (GDM) 11/18/2017   Previous cesarean delivery, delivered: arrest of dilation  11/18/2017   Abnormal glucose tolerance test (GTT) during pregnancy, antepartum 09/17/2017   Chronic active viral hepatitis B (HCC) 06/14/2014    Allergies:  Allergies  Allergen Reactions   Contrast Media [Iodinated Contrast Media] Anaphylaxis, Shortness Of Breath and Rash    IV contrast    Nsaids Other (See Comments)    Hx gastritis & gastric perforation    Prednisone & Diphenhydramine Other (See Comments)    Don't like to take it   Medications:  Current Outpatient Medications:    tenofovir (VIREAD) 300 MG tablet, Take 300 mg by mouth daily., Disp: , Rfl:    traZODone (DESYREL) 50 MG tablet, Take 0.5-1 tablets (25-50 mg total) by mouth at bedtime as needed for sleep., Disp: 30 tablet, Rfl: 2   Vitamin D, Ergocalciferol, (DRISDOL) 1.25 MG (50000 UNIT) CAPS capsule, TAKE ONE CAPSULE BY MOUTH ONCE WEEKLY, Disp: 8 capsule, Rfl: 0  Observations/Objective: Patient is well-developed, well-nourished in no acute distress.  Resting comfortably at desk at home.  Head is normocephalic, atraumatic.  No labored breathing. Speech is clear and coherent with logical content.  Patient is alert and oriented at baseline.   Assessment and Plan: 1. Atypical chest pain  Suspecting this is GI related -- silent reflux with gastritis/esophageal inflammation but giving her substantial medical history and mention of some SOB she needs in-person evaluation ASAP. Since going on since Monday without progression, she is safe to reach out to PCP for evaluation first but if  unavailable, agrees to be seen at UC/ER.   Follow Up Instructions: I discussed the assessment and treatment plan with the patient. The patient was provided an opportunity to ask questions and all were answered. The patient agreed with the plan and demonstrated an understanding of the instructions.  A copy of instructions were sent to the patient via MyChart unless otherwise noted below.   The patient was advised to call back or seek an in-person evaluation if the symptoms worsen or if the condition fails to improve as anticipated.  Time:  I spent 10 minutes with the patient via telehealth technology discussing the above problems/concerns.    Kim Climes, PA-C

## 2021-11-22 NOTE — Patient Instructions (Signed)
  Kim Pacheco, thank you for joining [name], PA-C for today's virtual visit.  While this provider is not your primary care provider (PCP), if your PCP is located in our provider database this encounter information will be shared with them immediately following your visit.  Consent: (Patient) Kim Pacheco provided verbal consent for this virtual visit at the beginning of the encounter.  Current Medications:  Current Outpatient Medications:    tenofovir (VIREAD) 300 MG tablet, Take 300 mg by mouth daily., Disp: , Rfl:    traZODone (DESYREL) 50 MG tablet, Take 0.5-1 tablets (25-50 mg total) by mouth at bedtime as needed for sleep., Disp: 30 tablet, Rfl: 2   Vitamin D, Ergocalciferol, (DRISDOL) 1.25 MG (50000 UNIT) CAPS capsule, TAKE ONE CAPSULE BY MOUTH ONCE WEEKLY, Disp: 8 capsule, Rfl: 0   Medications ordered in this encounter:  No orders of the defined types were placed in this encounter.    *If you need refills on other medications prior to your next appointment, please contact your pharmacy*  Follow-Up: Call back or seek an in-person evaluation if the symptoms worsen or if the condition fails to improve as anticipated.  Other Instructions Please contact your PCP. If unable to be seen today, please use link below to be scheduled at urgent care, or be seen at nearest ER for any worsening symptoms.    If you have been instructed to have an in-person evaluation today at a local Urgent Care facility, please use the link below. It will take you to a list of all of our available [facility], including address, phone number and hours of operation. Please do not delay care.  [facility]  If you or a family member do not have a primary care provider, use the link below to schedule a visit and establish care. When you choose a [facility] primary care physician or advanced practice provider, you gain a long-term partner in health. Find  a Primary Care Provider  Learn more about [facility]'s in-office and virtual care options: [facility] - Get Care Now

## 2021-11-22 NOTE — ED Notes (Signed)
Patient transported to Imaging.

## 2021-11-22 NOTE — ED Triage Notes (Signed)
Patient presents to ED via POV from urgent care. Patient reports chest pain that began on Monday. Today patient developed SOB and dizziness.

## 2021-11-22 NOTE — ED Notes (Signed)
Patient returned from Imaging.

## 2021-11-22 NOTE — ED Notes (Signed)
MD at the Bedside. 

## 2021-12-10 ENCOUNTER — Emergency Department (HOSPITAL_BASED_OUTPATIENT_CLINIC_OR_DEPARTMENT_OTHER)
Admission: EM | Admit: 2021-12-10 | Discharge: 2021-12-10 | Disposition: A | Discharge status: Home / Self Care | Payer: BC Managed Care – PPO | Attending: Emergency Medicine | Admitting: Emergency Medicine

## 2021-12-10 ENCOUNTER — Emergency Department (HOSPITAL_BASED_OUTPATIENT_CLINIC_OR_DEPARTMENT_OTHER): Payer: BC Managed Care – PPO

## 2021-12-10 ENCOUNTER — Encounter (HOSPITAL_BASED_OUTPATIENT_CLINIC_OR_DEPARTMENT_OTHER): Payer: Self-pay | Admitting: *Deleted

## 2021-12-10 ENCOUNTER — Other Ambulatory Visit: Payer: Self-pay

## 2021-12-10 DIAGNOSIS — K769 Liver disease, unspecified: Secondary | ICD-10-CM | POA: Diagnosis not present

## 2021-12-10 DIAGNOSIS — N2889 Other specified disorders of kidney and ureter: Secondary | ICD-10-CM | POA: Diagnosis not present

## 2021-12-10 DIAGNOSIS — R1084 Generalized abdominal pain: Secondary | ICD-10-CM | POA: Diagnosis not present

## 2021-12-10 DIAGNOSIS — K6389 Other specified diseases of intestine: Secondary | ICD-10-CM | POA: Diagnosis not present

## 2021-12-10 LAB — LIPASE, BLOOD: Lipase: 41 U/L (ref 11–51)

## 2021-12-10 LAB — CBC WITH DIFFERENTIAL/PLATELET
Abs Immature Granulocytes: 0.02 10*3/uL (ref 0.00–0.07)
Basophils Absolute: 0 10*3/uL (ref 0.0–0.1)
Basophils Relative: 0 %
Eosinophils Absolute: 0.1 10*3/uL (ref 0.0–0.5)
Eosinophils Relative: 1 %
HCT: 37.3 % (ref 36.0–46.0)
Hemoglobin: 12.8 g/dL (ref 12.0–15.0)
Immature Granulocytes: 0 %
Lymphocytes Relative: 18 %
Lymphs Abs: 1.4 10*3/uL (ref 0.7–4.0)
MCH: 31 pg (ref 26.0–34.0)
MCHC: 34.3 g/dL (ref 30.0–36.0)
MCV: 90.3 fL (ref 80.0–100.0)
Monocytes Absolute: 0.7 10*3/uL (ref 0.1–1.0)
Monocytes Relative: 9 %
Neutro Abs: 5.5 10*3/uL (ref 1.7–7.7)
Neutrophils Relative %: 72 %
Platelets: 209 10*3/uL (ref 150–400)
RBC: 4.13 MIL/uL (ref 3.87–5.11)
RDW: 12.3 % (ref 11.5–15.5)
WBC: 7.6 10*3/uL (ref 4.0–10.5)
nRBC: 0 % (ref 0.0–0.2)

## 2021-12-10 LAB — COMPREHENSIVE METABOLIC PANEL
ALT: 24 U/L (ref 0–44)
AST: 20 U/L (ref 15–41)
Albumin: 4.3 g/dL (ref 3.5–5.0)
Alkaline Phosphatase: 29 U/L — ABNORMAL LOW (ref 38–126)
Anion gap: 7 (ref 5–15)
BUN: 18 mg/dL (ref 6–20)
CO2: 27 mmol/L (ref 22–32)
Calcium: 9.5 mg/dL (ref 8.9–10.3)
Chloride: 104 mmol/L (ref 98–111)
Creatinine, Ser: 0.73 mg/dL (ref 0.44–1.00)
GFR, Estimated: >60 mL/min (ref >60)
Glucose, Bld: 125 mg/dL — ABNORMAL HIGH (ref 70–99)
Potassium: 4.4 mmol/L (ref 3.5–5.1)
Sodium: 138 mmol/L (ref 135–145)
Total Bilirubin: 0.5 mg/dL (ref 0.3–1.2)
Total Protein: 7.5 g/dL (ref 6.5–8.1)

## 2021-12-10 LAB — URINALYSIS, ROUTINE W REFLEX MICROSCOPIC
Bilirubin Urine: NEGATIVE
Glucose, UA: NEGATIVE mg/dL
Hgb urine dipstick: NEGATIVE
Ketones, ur: NEGATIVE mg/dL
Leukocytes,Ua: NEGATIVE
Nitrite: NEGATIVE
Protein, ur: NEGATIVE mg/dL
Specific Gravity, Urine: 1.021 (ref 1.005–1.030)
pH: 6 (ref 5.0–8.0)

## 2021-12-10 LAB — PREGNANCY, URINE: Preg Test, Ur: NEGATIVE

## 2021-12-10 MED ORDER — ONDANSETRON HCL 4 MG/2ML IJ SOLN
4.0000 mg | Freq: Once | INTRAMUSCULAR | Status: AC
  Administered 2021-12-10: 4 mg via INTRAVENOUS
  Filled 2021-12-10: qty 2

## 2021-12-10 MED ORDER — SODIUM CHLORIDE 0.9 % IV BOLUS
1000.0000 mL | Freq: Once | INTRAVENOUS | Status: AC
  Administered 2021-12-10: 1000 mL via INTRAVENOUS

## 2021-12-10 MED ORDER — ONDANSETRON 4 MG PO TBDP: ORAL_TABLET | ORAL | 0 refills | Status: DC

## 2021-12-10 MED ORDER — MORPHINE SULFATE (PF) 4 MG/ML IV SOLN
4.0000 mg | Freq: Once | INTRAVENOUS | Status: AC
  Administered 2021-12-10: 4 mg via INTRAVENOUS
  Filled 2021-12-10: qty 1

## 2021-12-10 NOTE — ED Triage Notes (Addendum)
C/o of general abd pain that started last night. Describes pain as intermittent and cramping. Has not taken anything for pain. Denies n/v or fevers. Denies any urinary symptoms. Pt states 3 glasses of wine per day.

## 2021-12-10 NOTE — ED Notes (Signed)
Pt verbalizes understanding of discharge instructions. Opportunity for questioning and answers were provided. Pt discharged from ED to home in uber.   ? ?

## 2021-12-10 NOTE — Discharge Instructions (Signed)
Follow up with your GI doc and family doc.  Return for worsening pain, fever, inability to eat or drink.

## 2021-12-10 NOTE — ED Provider Notes (Signed)
[facility] EMERGENCY DEPT Provider Note   CSN: [id number] Arrival date & time: 12/10/21  0240     History  Chief Complaint  Patient presents with   Abdominal Pain    Kim Pacheco is a 36 y.o. female.  36 yo F with a chief complaints of abdominal discomfort.  This is diffusely about the abdomen.  Been going on since last night.  Has been moving her bowels denies nausea or vomiting denies fevers.  Has a history of bladder wall perforation.  Has been able to eat and drink.   Abdominal Pain      Home Medications Prior to Admission medications   Medication Sig Start Date End Date Taking? Authorizing Provider  ondansetron (ZOFRAN-ODT) 4 MG disintegrating tablet 4mg  ODT q4 hours prn nausea/vomit 12/10/21  Yes 02/10/22, DO      Allergies    Contrast media [iodinated contrast media], Nsaids, and Prednisone & diphenhydramine    Review of Systems   Review of Systems  Gastrointestinal:  Positive for abdominal pain.    Physical Exam Updated Vital Signs BP 129/79   Pulse 86   Temp 99.4 F (37.4 C) (Oral)   Resp 18   LMP 11/10/2021 Comment: negative U-preg 11/22/21  SpO2 97%  Physical Exam Vitals and nursing note reviewed.  Constitutional:      General: She is not in acute distress.    Appearance: She is well-developed. She is not diaphoretic.  HENT:     Head: Normocephalic and atraumatic.  Eyes:     Pupils: Pupils are equal, round, and reactive to light.  Cardiovascular:     Rate and Rhythm: Normal rate and regular rhythm.     Heart sounds: No murmur heard.    No friction rub. No gallop.  Pulmonary:     Effort: Pulmonary effort is normal.     Breath sounds: No wheezing or rales.  Abdominal:     General: There is no distension.     Palpations: Abdomen is soft.     Tenderness: There is no abdominal tenderness.     Comments: Mid diffuse abdominal discomfort without focality  Musculoskeletal:        General: No tenderness.     Cervical  back: Normal range of motion and neck supple.  Skin:    General: Skin is warm and dry.  Neurological:     Mental Status: She is alert and oriented to person, place, and time.  Psychiatric:        Behavior: Behavior normal.     ED Results / Procedures / Treatments   Labs (all labs ordered are listed, but only abnormal results are displayed) Labs Reviewed  COMPREHENSIVE METABOLIC PANEL - Abnormal; Notable for the following components:      Result Value   Glucose, Bld 125 (*)    Alkaline Phosphatase 29 (*)    All other components within normal limits  CBC WITH DIFFERENTIAL/PLATELET  LIPASE, BLOOD  URINALYSIS, ROUTINE W REFLEX MICROSCOPIC  PREGNANCY, URINE    EKG None  Radiology CT Renal Stone Study  Result Date: 12/10/2021 CLINICAL DATA:  Abdominal pain.  Kidney stone suspected. EXAM: CT ABDOMEN AND PELVIS WITHOUT CONTRAST TECHNIQUE: Multidetector CT imaging of the abdomen and pelvis was performed following the standard protocol without IV contrast. RADIATION DOSE REDUCTION: This exam was performed according to the departmental dose-optimization program which includes automated exposure control, adjustment of the mA and/or kV according to patient size and/or use of iterative reconstruction technique. COMPARISON:  Similar CT studies without contrast of 11/22/2021, 02/13/2021. FINDINGS: Lower chest: No acute abnormality. Hepatobiliary: No focal liver abnormality is seen. The liver is hypoattenuating consistent with fatty replacement. There are tiny stones in the proximal gallbladder better demonstrated on the prior study when they were in the fundus. Pancreas: Unremarkable without contrast. Spleen: Unremarkable without contrast.  No splenomegaly. Adrenals/Urinary Tract: There is no adrenal mass no focal renal cortical abnormality visible without contrast. There is no urinary stone or obstruction. Both of the kidneys are small in length, the right measuring 7.8 cm in length and the left  measuring 8.4 cm length, but this was seen previously. Does the patient have normal renal function? There are no perinephric fluid collections or acute inflammatory changes. There is no bladder thickening. Stomach/Bowel: The gastric wall is unremarkable. There are 2 overlapping mildly dilated small bowel segments in the anterior mid abdomen just inferior to the transverse colon, the more inferior of the 2 with mild wall thickening but there are no inflammatory changes. Rest of the small bowel is normal caliber. The appendix is unremarkable. Large intestine wall is normal. Vascular/Lymphatic: Duplicated infrarenal IVC. No atherosclerotic calcifications in the aortoiliac vasculature. No AAA. No adenopathy. Normal portal vein caliber. Reproductive: The uterus is intact.  The ovaries are not enlarged. Other: There is horizontal scarring of the low anterior pelvic wall which is most likely due to the patient's prior C-section. There is no incarcerated hernia. There is no free air, free hemorrhage or abscess. Minimal low-density fluid is present in the pelvic cul-de-sac likely physiologic given age. There are pelvic phleboliths. Musculoskeletal: No acute or significant osseous findings. IMPRESSION: 1. Two overlapping mildly dilated anterior mid abdominal small bowel segments, the more inferior which shows wall thickening without inflammatory change. This could be due to a regional ileus or low-grade partial small bowel obstruction and was not seen on either prior study. If this is a partial small bowel obstruction, etiology is indeterminate. Follow-up as indicated. 2. No urinary stones or obstruction on the current and prior studies. 3. Both kidneys are small in length. This was seen previously. Is there normal renal function? 4. Mild hepatic steatosis. 5. Pelvic cul-de-sac fluid usually physiologic at this age. Nonspecific. 6. Cholelithiasis. 7. Duplicated infrarenal IVC. Electronically Signed   By: Telford Nab M.D.    On: 12/10/2021 05:09    Procedures Procedures    Medications Ordered in ED Medications  morphine (PF) 4 MG/ML injection 4 mg (4 mg Intravenous Given 12/10/21 0311)  ondansetron (ZOFRAN) injection 4 mg (4 mg Intravenous Given 12/10/21 0311)  sodium chloride 0.9 % bolus 1,000 mL (0 mLs Intravenous Stopped 12/10/21 0356)    ED Course/ Medical Decision Making/ A&P                           Medical Decision Making Amount and/or Complexity of Data Reviewed Labs: ordered. Radiology: ordered.  Risk Prescription drug management.   36 yo F with a chief complaint of diffuse abdominal discomfort.  On my record review the patient had an episode that was somewhat similar and she was admitted for pneumoperitoneum.  No obvious known source, was thought to be gastric but had an endoscopy at that visit without obvious etiology.  The patient tells me that she had a perforated bladder.  We will obtain a laboratory evaluation and CT scan treat pain and nausea and reassess.  Patient's blood work is unremarkable no leukocytosis no anemia UA negative for  infection pregnancy test negative LFTs and lipase are unremarkable.  CT scan of the abdomen pelvis was performed radiology read with possible ileus versus early partial small bowel obstruction.  Patient is doing quite a bit better on reassessment.  Continues to tolerate by mouth.  We will have her follow-up with her GI and PCP.  5:27 AM:  I have discussed the diagnosis/risks/treatment options with the patient.  Evaluation and diagnostic testing in the emergency department does not suggest an emergent condition requiring admission or immediate intervention beyond what has been performed at this time.  They will follow up with  PCP, GI. We also discussed returning to the ED immediately if new or worsening sx occur. We discussed the sx which are most concerning (e.g., sudden worsening pain, fever, inability to tolerate by mouth) that necessitate immediate return.  Medications administered to the patient during their visit and any new prescriptions provided to the patient are listed below.  Medications given during this visit Medications  morphine (PF) 4 MG/ML injection 4 mg (4 mg Intravenous Given 12/10/21 0311)  ondansetron (ZOFRAN) injection 4 mg (4 mg Intravenous Given 12/10/21 0311)  sodium chloride 0.9 % bolus 1,000 mL (0 mLs Intravenous Stopped 12/10/21 0356)     The patient appears reasonably screen and/or stabilized for discharge and I doubt any other medical condition or other [facility] requiring further screening, evaluation, or treatment in the ED at this time prior to discharge.          Final Clinical Impression(s) / ED Diagnoses Final diagnoses:  Generalized abdominal pain    Rx / DC Orders ED Discharge Orders          Ordered    ondansetron (ZOFRAN-ODT) 4 MG disintegrating tablet        12/10/21 0514              Melene Plan, DO 12/10/21 [id number]

## 2022-03-14 ENCOUNTER — Ambulatory Visit: Payer: BC Managed Care – PPO | Admitting: Family Medicine

## 2022-03-14 ENCOUNTER — Encounter: Payer: Self-pay | Admitting: Family Medicine

## 2022-03-14 VITALS — BP 130/80 | HR 66 | Temp 98.2°F | Ht 59.0 in | Wt 129.5 lb

## 2022-03-14 DIAGNOSIS — S161XXA Strain of muscle, fascia and tendon at neck level, initial encounter: Secondary | ICD-10-CM | POA: Insufficient documentation

## 2022-03-14 DIAGNOSIS — Z23 Encounter for immunization: Secondary | ICD-10-CM

## 2022-03-14 DIAGNOSIS — R739 Hyperglycemia, unspecified: Secondary | ICD-10-CM | POA: Diagnosis not present

## 2022-03-14 DIAGNOSIS — R0789 Other chest pain: Secondary | ICD-10-CM

## 2022-03-14 HISTORY — DX: Other chest pain: R07.89

## 2022-03-14 LAB — POCT GLYCOSYLATED HEMOGLOBIN (HGB A1C): Hemoglobin A1C: 5.5 % (ref 4.0–5.6)

## 2022-03-14 NOTE — Assessment & Plan Note (Signed)
Pt was seen for this in the ED on 11/22/2021 and was supposed to be referred to cardiology however pt reports no one has contacted her about the referral. I will place a new referral to the cardiologist for evaluation. I do not think that it is cardiac related, however the symptom has persisted, I will place the referral to cardiology for her

## 2022-03-14 NOTE — Patient Instructions (Signed)
Try Naproxen, 500 mg twice a day as needed for the neck pain.

## 2022-03-14 NOTE — Progress Notes (Signed)
Established Patient Office Visit  Subjective   Patient ID: Kim Pacheco, female    DOB: 1986/04/01  Age: 36 y.o. MRN: [id number]  Chief Complaint  Patient presents with   Transitions Of Care    Patient is here for transition of care visit.  She is reporting increasing pressure on the back of her neck and also a feeling of chest pressure in the middle of her chest. States that the chest pressure comes and goes, states that she notices it mostly when she is exercising. States that when she sits down and rests the pressure resolves. No shortness of breath.  States that she has been weight lifting at the gym in order to help lose weight.  Neck pain-- pt reports she is having some soreness like pain on the neck at the base of her skull. States this is fairly constant, not associated with the chest pressure. She reports she sleeps on her stomach and works on a computer for most of the day.    No current outpatient medications   Patient Active Problem List   Diagnosis Date Noted   Atypical chest pain 03/14/2022   Neck strain, initial encounter 03/14/2022   Extraperitoneal bladder perforation 05/17/2020   History of recurrent UTI (urinary tract infection) 05/08/2020   Free intraperitoneal air 05/08/2020   Anxiety    Abnormal CT of the abdomen    Hepatic steatosis 05/07/2020   Pneumoperitoneum 05/07/2020   Foley catheter in place 05/07/2020   Anal fissure 05/07/2020   Change in bowel habit 93/81/0175   Periumbilical pain 04/03/8526   Rectal bleeding 05/07/2020   History of gestational diabetes 05/07/2020   History of depression 05/07/2020   Portal hypertension ([place])    History of urinary retention 04/15/2020   Chronic abdominal pain 04/13/2020   Narcolepsy without cataplexy 02/02/2020   Depression, major, single episode, moderate ([place]) 10/28/2018   Elevated CA-125 08/06/2018   ARF (acute renal failure) ([place]) 06/25/2018   Alcohol abuse 06/24/2018   Constipation,  chronic 06/24/2018   Ascites 06/24/2018   Body mass index (BMI) of 25.0 to 29.9 05/29/2018   Fibrocystic disease of breast 05/29/2018   Metrorrhagia 05/29/2018   Urinary tract infectious disease 05/29/2018   Oral hypoglycemic controlled White classification A2 gestational diabetes mellitus (GDM) 11/18/2017   Previous cesarean delivery, delivered: arrest of dilation  11/18/2017   Abnormal glucose tolerance test (GTT) during pregnancy, antepartum 09/17/2017   Chronic active viral hepatitis B ([place]) 06/14/2014      Review of Systems  Respiratory:  Negative for shortness of breath.   Cardiovascular:  Negative for palpitations and leg swelling.  Neurological:  Negative for dizziness and loss of consciousness.  All other systems reviewed and are negative.     Objective:     BP 130/80   Pulse 66   Temp 98.2 F (36.8 C) (Oral)   Ht 4\' 11"  (1.499 m)   Wt 129 lb 8 oz (58.7 kg)   SpO2 97%   BMI 26.16 kg/m    Physical Exam Vitals reviewed.  Constitutional:      Appearance: Normal appearance. She is well-groomed and normal weight.  HENT:     Head: Normocephalic and atraumatic.  Eyes:     Extraocular Movements: Extraocular movements intact.     Conjunctiva/sclera: Conjunctivae normal.     Pupils: Pupils are equal, round, and reactive to light.  Cardiovascular:     Rate and Rhythm: Normal rate and regular rhythm.     Pulses: Normal  pulses.     Heart sounds: S1 normal and S2 normal.  Pulmonary:     Effort: Pulmonary effort is normal.     Breath sounds: Normal breath sounds and air entry.  Abdominal:     General: Abdomen is flat. Bowel sounds are normal.     Palpations: Abdomen is soft.  Musculoskeletal:        General: Normal range of motion.     Cervical back: Normal range of motion and neck supple.     Right lower leg: No edema.     Left lower leg: No edema.  Skin:    General: Skin is warm and dry.  Neurological:     Mental Status: She is alert and oriented to person,  place, and time. Mental status is at baseline.     Gait: Gait is intact.  Psychiatric:        Mood and Affect: Mood and affect normal.        Speech: Speech normal.        Behavior: Behavior normal.        Judgment: Judgment normal.      Results for orders placed or performed in visit on 03/14/22  POC HgB A1c  Result Value Ref Range   Hemoglobin A1C 5.5 4.0 - 5.6 %   HbA1c POC (<> result, manual entry)     HbA1c, POC (prediabetic range)     HbA1c, POC (controlled diabetic range)        The ASCVD Risk score (Arnett DK, et al., 2019) failed to calculate for the following reasons:   The 2019 ASCVD risk score is only valid for ages 68 to 62    Assessment & Plan:   Problem List Items Addressed This Visit       Musculoskeletal and Integument   Neck strain, initial encounter    Most likely MS in origin, recommended NSAIDS, changing her sleep position and trying a more supportive pillow, also repositioning her iphone and computer screen to prevent constant neck flexion.        Other   Atypical chest pain    Pt was seen for this in the ED on 11/22/2021 and was supposed to be referred to cardiology however pt reports no one has contacted her about the referral. I will place a new referral to the cardiologist for evaluation. I do not think that it is cardiac related, however the symptom has persisted, I will place the referral to cardiology for her      Relevant Orders   Ambulatory referral to Cardiology   Other Visit Diagnoses     Hyperglycemia    -  Primary   Relevant Orders   POC HgB A1c (Completed)   Need for immunization against influenza       Relevant Orders   Flu Vaccine QUAD 53mo+IM (Fluarix, Fluzone & Alfiuria Quad PF) (Completed)       Return in about 6 months (around 09/13/2022).    Farrel Conners, MD

## 2022-03-14 NOTE — Assessment & Plan Note (Signed)
Most likely MS in origin, recommended NSAIDS, changing her sleep position and trying a more supportive pillow, also repositioning her iphone and computer screen to prevent constant neck flexion.

## 2022-03-22 ENCOUNTER — Encounter: Payer: Self-pay | Admitting: Family Medicine

## 2022-03-22 DIAGNOSIS — R0609 Other forms of dyspnea: Secondary | ICD-10-CM

## 2022-03-23 NOTE — Progress Notes (Unsigned)
Cardiology Office Note:    Date:  03/25/2022   ID:  Kim Pacheco, DOB 1985/09/22, MRN [id number]  PCP:  Farrel Conners, MD   [place] Providers Cardiologist:  None     Referring MD: Farrel Conners, MD   CC: Chest pressure Consulted for the evaluation of chest pain at the behest of Dr. Legrand Como  History of Present Illness:    Kim Pacheco is a 36 y.o. female with a hx of Gestational DM, anxiety, depression, history of portal HTN NOS ; hep B carrier (she is not able verify PHTN to this form MAR), former smoker, notes she had a hole in her bladder, who presents with chest pain.  Pain with 2021 Echo.  Was last feeling well in the beginning of this year, around April or May. In May had chest pressure; this occurred when she was sitting from home.  Not associated with exercise.  She works out with strength training and barbells lifts, does little cardio. Chest pressure occurs with weight lifting so she has cut down weight and did more reps. No pain after weight lifting. Had one episode of sharp chest pain while eating lunch with her husband; left sided. Pain goes a way with time, and drinking water or lying down.  No clear alleviating factor. Patient notes that she has been having chest pressure.    Had ED visit because of this.  Largely benign work up.  Past Medical History:  Diagnosis Date   Abdominal pain    started in mid abd and spread to left side/on and off/since Jan 2020/ pt states, she has fluid in abd/unknown   Abnormal Pap smear    normal 06/2013   Anxiety    Constipation    hx of   Depression    suicide attempt in college   Diabetes mellitus without complication ([place])    gestational, no issues since pregnancy per her report - resolved   Gestational diabetes    /no meds now - resolved   Hepatitis    Hep B, managed by Dr. Earlean Shawl   History of chicken pox    History of suicide attempt 2003   Attempt in college   History of  UTI    Vaginal Pap smear, abnormal     Past Surgical History:  Procedure Laterality Date   BIOPSY  05/10/2020   Procedure: BIOPSY;  Surgeon: Yetta Flock, MD;  Location: [facility];  Service: Gastroenterology;;   BLADDER REPAIR N/A 05/17/2020   Procedure: EXPLORATORY LAPAROTOMY WITH CYSTORRHAPHY;  Surgeon: Franchot Gallo, MD;  Location: [facility];  Service: Urology;  Laterality: N/A;   CESAREAN SECTION N/A 01/26/2013   Procedure: Primary Cesarean Section Delivery Baby  Boy @ 0017, Apgars 4/8/9;  Surgeon: Farrel Gobble. Harrington Challenger, MD;  Location: [facility];  Service: Obstetrics;  Laterality: N/A;   CESAREAN SECTION N/A 11/18/2017   Procedure: CESAREAN SECTION;  Surgeon: Brien Few, MD;  Location: [facility];  Service: Obstetrics;  Laterality: N/A;   COLONOSCOPY  2020   DIAGNOSTIC LAPAROSCOPIC LIVER BIOPSY N/A 05/07/2020   Procedure: DIAGNOSTIC LAPAROSCOPIC;  Surgeon: Michael Boston, MD;  Location: [facility];  Service: General;  Laterality: N/A;   DIAGNOSTIC LAPAROSCOPY  08/25/2018   Dr Polly Cobia with Ob/Gyn3/2020   ESOPHAGOGASTRODUODENOSCOPY (EGD) WITH PROPOFOL N/A 05/10/2020   Procedure: ESOPHAGOGASTRODUODENOSCOPY (EGD) WITH PROPOFOL;  Surgeon: Yetta Flock, MD;  Location: [facility];  Service: Gastroenterology;  Laterality: N/A;   ESOPHAGOGASTRODUODENOSCOPY ENDOSCOPY     IR  TRANSCATHETER BX  04/18/2020   IR US GUIDE VASC ACCESS RIGHT  04/18/2020   IR VENOGRAM HEPATIC W HEMODYNAMIC EVALUATION  04/18/2020   NASAL SINUS SURGERY  1996   WISDOM TOOTH EXTRACTION      Current Medications: Current Meds  Medication Sig   Tenofovir Alafenamide Fumarate 25 MG TABS Take 1 tablet by mouth daily.     Allergies:   Contrast media [iodinated contrast media], Nsaids, Prednisone & diphenhydramine, and Prednisone   Social History   Socioeconomic History   Marital status: Married    Spouse name: Not on file   Number of children: 1   Years of education: college   Highest education level:  Not on file  Occupational History   Occupation: Environmental manager  Tobacco Use   Smoking status: Former    Packs/day: 1.00    Types: Cigarettes    Quit date: 07/23/2011    Years since quitting: 10.6   Smokeless tobacco: Never   Tobacco comments:    smoked for 8 years, quit in 2013  Vaping Use   Vaping Use: Never used  Substance and Sexual Activity   Alcohol use: Yes    Alcohol/week: 3.0 standard drinks of alcohol    Types: 3 Glasses of wine per week    Comment: 3 glasses of wine per day   Drug use: Not Currently    Types: Marijuana    Comment: Last use 12/19/18   Sexual activity: Yes  Other Topics Concern   Not on file  Social History Narrative   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  FHx unknown   Social Determinants of Corporate investment banker Strain: Not on file  Food Insecurity: Not on file  Transportation Needs: Not on file  Physical Activity: Not on file  Stress: Not on file  Social Connections: Not on file     Family History: The patient's She was adopted. Family history is unknown by patient.  ROS:   Please see the history of present illness.     All other systems reviewed and are negative.  EKGs/Labs/Other Studies Reviewed:    The following studies were reviewed today:  EKG:  EKG is  ordered today.  The ekg ordered today demonstrates  11/22/21: SR   ECHO COMPLETE WO IMAGING ENHANCING AGENT 04/14/2020  Narrative ECHOCARDIOGRAM REPORT    Patient Name:   Kim Pacheco Date of Exam: 04/14/2020 Medical Rec #:  [id number]               Height:       60.0 in Accession #:    [id number]              Weight:       134.1 lb Date of Birth:  08/24/1985               BSA:          1.575 m Patient Age:    34 years                BP:            119/86 mmHg Patient Gender: F  HR:           81 bpm. Exam Location:  Inpatient  Procedure: 2D Echo  Indications:    CHF-Acute Diastolic 428.31 / I50.31  History:        Patient has no prior history of Echocardiogram examinations. Past medical history of hepatitis B, ascites of unclear etiology.  Sonographer:    Leta Junglingiffany Cooper RDCS Referring Phys: [phone] STEPHEN K CHIU   Sonographer Comments: Technically difficult study due to poor echo windows. Exam ended per patients request. IMPRESSIONS   1. Left ventricular ejection fraction, by estimation, is 55 to 60%. The left ventricle has normal function. The left ventricle has no regional wall motion abnormalities. Left ventricular diastolic function could not be evaluated. 2. Right ventricular systolic function is normal. The right ventricular size is normal. 3. Large pleural effusion. 4. The mitral valve is normal in structure. No evidence of mitral valve regurgitation. 5. The aortic valve is normal in structure. Aortic valve regurgitation is not visualized.  FINDINGS Left Ventricle: Left ventricular ejection fraction, by estimation, is 55 to 60%. The left ventricle has normal function. The left ventricle has no regional wall motion abnormalities. The left ventricular internal cavity size was normal in size. There is no left ventricular hypertrophy. Left ventricular diastolic function could not be evaluated.  Right Ventricle: The right ventricular size is normal. Right vetricular wall thickness was not assessed. Right ventricular systolic function is normal.  Left Atrium: Left atrial size was normal in size.  Right Atrium: Right atrial size was normal in size.  Pericardium: There is no evidence of pericardial effusion.  Mitral Valve: The mitral valve is normal in structure. No evidence of mitral valve regurgitation.  Tricuspid Valve: The tricuspid valve is normal in structure. Tricuspid valve regurgitation is  trivial.  Aortic Valve: The aortic valve is normal in structure. Aortic valve regurgitation is not visualized.  Pulmonic Valve: The pulmonic valve was normal in structure. Pulmonic valve regurgitation is not visualized.  Aorta: The aortic root is normal in size and structure.  IAS/Shunts: The interatrial septum was not assessed.  Additional Comments: There is a large pleural effusion.   LEFT VENTRICLE PLAX 2D LVIDd:         3.50 cm  Diastology LVIDs:         2.40 cm  LV e' medial:    5.22 cm/s LV PW:         0.80 cm  LV E/e' medial:  9.3 LV IVS:        0.70 cm  LV e' lateral:   6.42 cm/s LVOT diam:     1.80 cm  LV E/e' lateral: 7.5 LV SV:         41 LV SV Index:   26 LVOT Area:     2.54 cm   RIGHT VENTRICLE RV S prime:     8.05 cm/s  LEFT ATRIUM             Index       RIGHT ATRIUM          Index LA diam:        3.10 cm 1.97 cm/m  RA Area:     7.94 cm LA Vol (A2C):   17.8 ml 11.30 ml/m RA Volume:   12.60 ml 8.00 ml/m LA Vol (A4C):   22.3 ml 14.16 ml/m LA Biplane Vol: 20.0 ml 12.70 ml/m AORTIC VALVE LVOT Vmax:   86.80 cm/s LVOT Vmean:  56.600 cm/s LVOT VTI:  0.161 m  AORTA Ao Root diam: 2.30 cm  MITRAL VALVE MV Area (PHT): 2.99 cm    SHUNTS MV Decel Time: 254 msec    Systemic VTI:  0.16 m MV E velocity: 48.40 cm/s  Systemic Diam: 1.80 cm MV A velocity: 66.80 cm/s MV E/A ratio:  0.72  Dietrich Pates MD Electronically signed by Dietrich Pates MD Signature Date/Time: 04/14/2020/7:29:20 PM   Recent Labs: 06/06/2021: TSH 0.86 12/10/2021: ALT 24; BUN 18; Creatinine, Ser 0.73; Hemoglobin 12.8; Platelets 209; Potassium 4.4; Sodium 138  Recent Lipid Panel    Component Value Date/Time   CHOL 192 06/06/2021 1223   TRIG 144.0 06/06/2021 1223   HDL 64.10 06/06/2021 1223   CHOLHDL 3 06/06/2021 1223   VLDL 28.8 06/06/2021 1223   LDLCALC 99 06/06/2021 1223           Physical Exam:    VS:  BP 112/60   Pulse 63   Ht 4' 11.34" (1.507 m)   Wt 130 lb (59 kg)   SpO2  98%   BMI 25.96 kg/m     Wt Readings from Last 3 Encounters:  03/25/22 130 lb (59 kg)  03/14/22 129 lb 8 oz (58.7 kg)  11/22/21 124 lb (56.2 kg)    Gen: no distress  Neck: No JVD Cardiac: No Rubs or Gallops, no murmur, RRR +2 radial pulses Respiratory: Clear to auscultation bilaterally, normal effort, normal  respiratory rate GI: Soft, nontender, non-distended  MS: No  edema;  moves all extremities Integument: Skin feels warm Neuro:  At time of evaluation, alert and oriented to person/place/time/situation  Psych: Normal affect, patient feels well  ASSESSMENT:    1. Atypical chest pain   2. SOB (shortness of breath)   3. DOE (dyspnea on exertion)    PLAN:    Precordial Pain HX of gestational DM DOE Former smoker - given her persistent sx will get POET and Echo (especially with chart review hx of Portal HTN; with incomplete echo due to patient discomfort) - if WNL, may be non cardiac cause  PRN f/u unless positive results  Medication Adjustments/Labs and Tests Ordered: Current medicines are reviewed at length with the patient today.  Concerns regarding medicines are outlined above.  Orders Placed This Encounter  Procedures   Cardiac Stress Test: Informed Consent Details: Physician/Practitioner Attestation; Transcribe to consent form and obtain patient signature   EXERCISE TOLERANCE TEST (ETT)   ECHOCARDIOGRAM COMPLETE   No orders of the defined types were placed in this encounter.   Patient Instructions  Medication Instructions:  Your physician recommends that you continue on your current medications as directed. Please refer to the Current Medication list given to you today.  *If you need a refill on your cardiac medications before your next appointment, please call your pharmacy*   Lab Work: NONE If you have labs (blood work) drawn today and your tests are completely normal, you will receive your results only by: MyChart Message (if you have MyChart) OR A  paper copy in the mail If you have any lab test that is abnormal or we need to change your treatment, we will call you to review the results.   Testing/Procedures: Your physician has requested that you have an echocardiogram. Echocardiography is a painless test that uses sound waves to create images of your heart. It provides your doctor with information about the size and shape of your heart and how well your heart's chambers and valves are working. This procedure takes approximately one hour.  There are no restrictions for this procedure. Please do NOT wear cologne, perfume, aftershave, or lotions (deodorant is allowed). Please arrive 15 minutes prior to your appointment time.  Your physician has requested that you have an exercise tolerance test. For further information please visit [email or web address] Please also follow instruction sheet, as given.    Follow-Up: As needed  At [facility], you and your health needs are our priority.  As part of our continuing mission to provide you with exceptional heart care, we have created designated Provider Care Teams.  These Care Teams include your primary Cardiologist (physician) and Advanced Practice Providers (APPs -  Physician Assistants and Nurse Practitioners) who all work together to provide you with the care you need, when you need it.   Provider:   Riley Lam, MD    Important Information About Sugar         Signed, Christell Constant, MD  03/25/2022 5:27 PM    [facility]

## 2022-03-25 ENCOUNTER — Ambulatory Visit: Payer: BC Managed Care – PPO | Attending: Cardiology | Admitting: Internal Medicine

## 2022-03-25 ENCOUNTER — Encounter: Payer: Self-pay | Admitting: Internal Medicine

## 2022-03-25 VITALS — BP 112/60 | HR 63 | Ht 59.34 in | Wt 130.0 lb

## 2022-03-25 DIAGNOSIS — R0609 Other forms of dyspnea: Secondary | ICD-10-CM

## 2022-03-25 DIAGNOSIS — R0602 Shortness of breath: Secondary | ICD-10-CM | POA: Diagnosis not present

## 2022-03-25 DIAGNOSIS — R0789 Other chest pain: Secondary | ICD-10-CM | POA: Diagnosis not present

## 2022-03-25 NOTE — Patient Instructions (Signed)
Medication Instructions:  Your physician recommends that you continue on your current medications as directed. Please refer to the Current Medication list given to you today.  *If you need a refill on your cardiac medications before your next appointment, please call your pharmacy*   Lab Work: NONE If you have labs (blood work) drawn today and your tests are completely normal, you will receive your results only by: [place] (if you have MyChart) OR A paper copy in the mail If you have any lab test that is abnormal or we need to change your treatment, we will call you to review the results.   Testing/Procedures: Your physician has requested that you have an echocardiogram. Echocardiography is a painless test that uses sound waves to create images of your heart. It provides your doctor with information about the size and shape of your heart and how well your heart's chambers and valves are working. This procedure takes approximately one hour. There are no restrictions for this procedure. Please do NOT wear cologne, perfume, aftershave, or lotions (deodorant is allowed). Please arrive 15 minutes prior to your appointment time.  Your physician has requested that you have an exercise tolerance test. For further information please visit [email or web address]. Please also follow instruction sheet, as given.    Follow-Up: As needed  At [facility], you and your health needs are our priority.  As part of our continuing mission to provide you with exceptional heart care, we have created designated Provider Care Teams.  These Care Teams include your primary Cardiologist (physician) and Advanced Practice Providers (APPs -  Physician Assistants and Nurse Practitioners) who all work together to provide you with the care you need, when you need it.   Provider:   Rudean Haskell, MD    Important Information About Sugar

## 2022-03-27 ENCOUNTER — Ambulatory Visit: Payer: BC Managed Care – PPO | Attending: Cardiovascular Disease

## 2022-03-27 DIAGNOSIS — R0789 Other chest pain: Secondary | ICD-10-CM

## 2022-03-27 DIAGNOSIS — R0602 Shortness of breath: Secondary | ICD-10-CM | POA: Diagnosis not present

## 2022-03-27 LAB — EXERCISE TOLERANCE TEST
Angina Index: 0
Duke Treadmill Score: 9
Estimated workload: 10.1
Exercise duration (min): 9 min
Exercise duration (sec): 1 s
MPHR: 182 {beats}/min
Peak HR: 134 {beats}/min
Percent HR: 73 %
Rest HR: 53 {beats}/min
ST Depression (mm): 0 mm

## 2022-04-08 ENCOUNTER — Ambulatory Visit (HOSPITAL_COMMUNITY): Payer: BC Managed Care – PPO | Attending: Cardiology

## 2022-04-08 DIAGNOSIS — R0789 Other chest pain: Secondary | ICD-10-CM | POA: Insufficient documentation

## 2022-04-08 DIAGNOSIS — R0602 Shortness of breath: Secondary | ICD-10-CM | POA: Diagnosis not present

## 2022-04-08 LAB — ECHOCARDIOGRAM COMPLETE
Area-P 1/2: 3.4 cm2
S' Lateral: 2.4 cm

## 2022-04-10 ENCOUNTER — Ambulatory Visit (HOSPITAL_BASED_OUTPATIENT_CLINIC_OR_DEPARTMENT_OTHER): Payer: BC Managed Care – PPO | Admitting: Cardiology

## 2022-04-12 MED ORDER — ALBUTEROL SULFATE HFA 108 (90 BASE) MCG/ACT IN AERS: 2.0000 | INHALATION_SPRAY | RESPIRATORY_TRACT | 0 refills | Status: DC | PRN

## 2022-04-29 ENCOUNTER — Encounter: Payer: Self-pay | Admitting: Family Medicine

## 2022-04-30 ENCOUNTER — Encounter: Payer: Self-pay | Admitting: Family Medicine

## 2022-04-30 ENCOUNTER — Ambulatory Visit: Payer: BC Managed Care – PPO | Admitting: Family Medicine

## 2022-04-30 VITALS — BP 100/58 | HR 65 | Temp 98.3°F | Ht 59.0 in | Wt 127.1 lb

## 2022-04-30 DIAGNOSIS — E559 Vitamin D deficiency, unspecified: Secondary | ICD-10-CM

## 2022-04-30 DIAGNOSIS — L659 Nonscarring hair loss, unspecified: Secondary | ICD-10-CM

## 2022-04-30 NOTE — Progress Notes (Signed)
Established Patient Office Visit  Subjective   Patient ID: Kim Pacheco, female    DOB: 1985-12-02  Age: 36 y.o. MRN: [id number]  Chief Complaint  Patient presents with   Hair/Scalp Problem    Patient complains of increased thinning of bilateral eyebrows x2 months    Patient is noticing that her eyebrows seem to be getting thinner over the past 2 months. She has been putting make make up on them to conceal it. She also reports hair loss from her head as well, states that her husband has mentioned that there is more hair in the shower drain.   Patient is a hepatitis B carrier, was seeing a specialist for this before, however has not followed up in some time. I reviewed the notes from [facility] and recommended that she go back to the specialist to follow up.       Review of Systems  All other systems reviewed and are negative.     Objective:     BP (!) 100/58 (BP Location: Right Arm, Patient Position: Sitting, Cuff Size: Normal)   Pulse 65   Temp 98.3 F (36.8 C) (Oral)   Ht 4\' 11"  (1.499 m)   Wt 127 lb 1.6 oz (57.7 kg)   LMP 04/29/2022 (Exact Date)   SpO2 98%   BMI 25.67 kg/m    Physical Exam Vitals reviewed.  Constitutional:      Appearance: Normal appearance. She is well-groomed and normal weight.  Eyes:     Conjunctiva/sclera: Conjunctivae normal.  Neck:     Thyroid: No thyromegaly.  Cardiovascular:     Rate and Rhythm: Normal rate and regular rhythm.     Heart sounds: S1 normal and S2 normal.  Pulmonary:     Effort: Pulmonary effort is normal.     Breath sounds: Normal breath sounds and air entry.  Abdominal:     General: Bowel sounds are normal.  Skin:    Comments: Scalp examined-- no bare patches, there is some evidence of thinning.  Neurological:     Mental Status: She is alert and oriented to person, place, and time. Mental status is at baseline.     Gait: Gait is intact.  Psychiatric:        Mood and Affect: Mood and affect normal.         Speech: Speech normal.        Behavior: Behavior normal.        Judgment: Judgment normal.      No results found for any visits on 04/30/22.    The ASCVD Risk score (Arnett DK, et al., 2019) failed to calculate for the following reasons:   The 2019 ASCVD risk score is only valid for ages 37 to 95    Assessment & Plan:   Problem List Items Addressed This Visit   None Visit Diagnoses     Hair loss    -  Primary   Relevant Orders    Unclear etiology, I will check vitamin levels to rule out any deficiencies. I wonder if it could be related to her liver issues and hep B status. I adivsed her to increase protein intake, we discussed starting rogaine treatment however once this is started it will need to be continued indefinitely.   Vitamin D, 25-hydroxy   B12 and Folate Panel   TSH   Iron, TIBC and Ferritin Panel   Vitamin D deficiency       Relevant Orders   Vitamin D, 25-hydroxy  No follow-ups on file.    Farrel Conners, MD

## 2022-05-01 LAB — IRON,TIBC AND FERRITIN PANEL
%SAT: 20 % (calc) (ref 16–45)
Ferritin: 187 ng/mL — ABNORMAL HIGH (ref 16–154)
Iron: 71 ug/dL (ref 40–190)
TIBC: 358 mcg/dL (calc) (ref 250–450)

## 2022-05-01 LAB — VITAMIN D 25 HYDROXY (VIT D DEFICIENCY, FRACTURES): VITD: 21 ng/mL — ABNORMAL LOW (ref 30.00–100.00)

## 2022-05-01 LAB — B12 AND FOLATE PANEL
Folate: 21.4 ng/mL (ref >5.9)
Vitamin B-12: 1028 pg/mL — ABNORMAL HIGH (ref 211–911)

## 2022-05-01 LAB — TSH: TSH: 0.91 u[IU]/mL (ref 0.35–5.50)

## 2022-05-01 NOTE — Progress Notes (Signed)
Vitamin D is still low at 21, I recommend she take 2000 units of vitamin D daily. She may get this over the counter or I can call it in if she would like. The rest of her vitamin levels are normal.

## 2022-05-09 ENCOUNTER — Ambulatory Visit (INDEPENDENT_AMBULATORY_CARE_PROVIDER_SITE_OTHER): Payer: BC Managed Care – PPO | Admitting: Pulmonary Disease

## 2022-05-09 ENCOUNTER — Encounter: Payer: Self-pay | Admitting: Family Medicine

## 2022-05-09 ENCOUNTER — Encounter (HOSPITAL_BASED_OUTPATIENT_CLINIC_OR_DEPARTMENT_OTHER): Payer: Self-pay | Admitting: Pulmonary Disease

## 2022-05-09 ENCOUNTER — Other Ambulatory Visit (HOSPITAL_BASED_OUTPATIENT_CLINIC_OR_DEPARTMENT_OTHER): Payer: Self-pay

## 2022-05-09 VITALS — BP 98/62 | HR 62 | Temp 98.1°F | Ht 59.0 in | Wt 127.8 lb

## 2022-05-09 DIAGNOSIS — R0602 Shortness of breath: Secondary | ICD-10-CM

## 2022-05-09 DIAGNOSIS — J45909 Unspecified asthma, uncomplicated: Secondary | ICD-10-CM

## 2022-05-09 LAB — PULMONARY FUNCTION TEST
FEF 25-75 Pre: 2.73 L/sec
FEF2575-%Pred-Pre: 95 %
FEV1-%Pred-Pre: 101 %
FEV1-Pre: 2.55 L
FEV1FVC-%Pred-Pre: 102 %
FEV6-%Pred-Pre: 99 %
FEV6-Pre: 2.97 L
FEV6FVC-%Pred-Pre: 101 %
FVC-%Pred-Pre: 99 %
FVC-Pre: 3 L
Pre FEV1/FVC ratio: 85 %
Pre FEV6/FVC Ratio: 100 %

## 2022-05-09 NOTE — Progress Notes (Signed)
   Subjective:    Patient ID: Kim Pacheco, female    DOB: Oct 20, 1985, 36 y.o.   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     Diagnosis of narcolepsy was established in 2021, she has been lost to follow-up since. She had cardiology evaluation for chest pain requiring ED visit including normal EKG and echo. Chest x-ray 11/2021 was clear\  She reports episodes where she feels chest pressure and difficulty to take a deep breath.  Last such episode was 2 weeks ago.  She has used albuterol MDI was referred by her PCP which provided some relief.  She denies overt wheezing or cough.  Episodes generally occur when she is at work.  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 She smoked about 10 pack years and quit 2013 but still smokes occasionally  There is no history suggestive of cataplexy, sleep paralysis or parasomnias    Significant tests/ events reviewed NPSG 03/2020 >> no OSA or PLM's MSLT 03/2020 >> mean sleep latency 2.2 mins , SOREMs 2/5  Review of Systems neg for any significant sore throat, dysphagia, itching, sneezing, nasal congestion or excess/ purulent secretions, fever, chills, sweats, unintended wt loss, pleuritic or exertional cp, hempoptysis, orthopnea pnd or change in chronic leg swelling. Also denies presyncope, palpitations, heartburn, abdominal pain, nausea, vomiting, diarrhea or change in bowel or urinary habits, dysuria,hematuria, rash, arthralgias, visual complaints, headache, numbness weakness or ataxia.     Objective:   Physical Exam  Gen. Pleasant, well-nourished, in no distress ENT - no thrush, no pallor/icterus,no post nasal drip Neck: No JVD, no thyromegaly, no carotid bruits Lungs:  no use of accessory muscles, no dullness to percussion, clear without rales or rhonchi  Cardiovascular: Rhythm regular, heart sounds  normal, no murmurs or gallops, no peripheral edema Musculoskeletal: No deformities, no cyanosis or clubbing        Assessment & Plan:   Dyspnea -episodes seem to resemble anxiety rather than asthma.  Will obtain spirometry to clarify, I doubt that she has COPD . Her smoking history does not seem to be too severe  Spirometry was normal

## 2022-05-09 NOTE — Patient Instructions (Signed)
-  Spirometry pre-/post  Your symptoms may be related to anxiety.  For narcolepsy, schedule bedtime and wake up time is recommended. Nap x 1 hour between 2 to 3 PM

## 2022-05-09 NOTE — Progress Notes (Signed)
Spirometry performed today. 

## 2022-05-09 NOTE — Assessment & Plan Note (Signed)
scheduled bedtime and wake up time is recommended. Nap x 1 hour between 2 to 3 PM  Hypersomnolence does not seem to be severe and she would like to hold off medications

## 2022-05-09 NOTE — Patient Instructions (Signed)
Spirometry Performed Today.  

## 2022-05-14 ENCOUNTER — Encounter: Payer: Self-pay | Admitting: Family Medicine

## 2022-05-14 ENCOUNTER — Telehealth (INDEPENDENT_AMBULATORY_CARE_PROVIDER_SITE_OTHER): Payer: BC Managed Care – PPO | Admitting: Family Medicine

## 2022-05-14 VITALS — Wt 127.0 lb

## 2022-05-14 DIAGNOSIS — F419 Anxiety disorder, unspecified: Secondary | ICD-10-CM

## 2022-05-14 MED ORDER — ESCITALOPRAM OXALATE 5 MG PO TABS: 5.0000 mg | ORAL_TABLET | Freq: Every day | ORAL | 0 refills | Status: DC

## 2022-05-14 NOTE — Assessment & Plan Note (Signed)
We reviewed risks/benefits of SSRI's, will start 5 mg daily of lexapro. I will follow up with another video visit in 3 months to reassess her symptoms.

## 2022-05-14 NOTE — Progress Notes (Signed)
Established Patient Office Visit  Subjective   Patient ID: Kim Pacheco, female    DOB: 1985-08-18  Age: 36 y.o. MRN: [id number]  Chief Complaint  Patient presents with   Shortness of Breath    Patient states she has been seen by pulmonary and was advised to follow up with PCP as symptoms with shortness of breath and chest pain are due to questionable anxiety    I connected with  Hyman Bower Sollecito-Olson on 05/14/22 by a video enabled telemedicine application and verified that I am speaking with the correct person using two identifiers.   I discussed the limitations of evaluation and management by telemedicine. The patient expressed understanding and agreed to proceed.  Patient location: home address  Provider location: [employer] [place]  Patient reports that her symptoms have improved, although she reports that her symptoms are mostly associated with exercise and she hasn't been exercising much recently. States that she has only had 2 episodes while at work of the SOB and chest pain. States that she does get anxious and worried during these episodes. We discussed the symptoms of anxiety, pt reports that at baseline she does not feel particularly anxious. We discussed medications that are indicated for treatment of anxiety and she would like to try the medication.     Current Outpatient Medications  Medication Instructions   albuterol (VENTOLIN HFA) 108 (90 Base) MCG/ACT inhaler 2 puffs, Inhalation, Every 4 hours PRN   escitalopram (LEXAPRO) 5 mg, Oral, Daily   Tenofovir Alafenamide Fumarate 25 MG TABS 1 tablet, Oral, Daily    Patient Active Problem List   Diagnosis Date Noted   Atypical chest pain 03/14/2022   Neck strain, initial encounter 03/14/2022   Extraperitoneal bladder perforation 05/17/2020   History of recurrent UTI (urinary tract infection) 05/08/2020   Free intraperitoneal air 05/08/2020   Anxiety    Abnormal CT of the abdomen    Hepatic steatosis  05/07/2020   Pneumoperitoneum 05/07/2020   Foley catheter in place 05/07/2020   Anal fissure 05/07/2020   Change in bowel habit 05/07/2020   Periumbilical pain 05/07/2020   Rectal bleeding 05/07/2020   History of gestational diabetes 05/07/2020   History of depression 05/07/2020   Portal hypertension (HCC)    History of urinary retention 04/15/2020   Chronic abdominal pain 04/13/2020   Narcolepsy without cataplexy 02/02/2020   Depression, major, single episode, moderate (HCC) 10/28/2018   Elevated CA-125 08/06/2018   ARF (acute renal failure) (HCC) 06/25/2018   Alcohol abuse 06/24/2018   Constipation, chronic 06/24/2018   Ascites 06/24/2018   Body mass index (BMI) of 25.0 to 29.9 05/29/2018   Fibrocystic disease of breast 05/29/2018   Metrorrhagia 05/29/2018   Urinary tract infectious disease 05/29/2018   Oral hypoglycemic controlled White classification A2 gestational diabetes mellitus (GDM) 11/18/2017   Previous cesarean delivery, delivered: arrest of dilation  11/18/2017   Abnormal glucose tolerance test (GTT) during pregnancy, antepartum 09/17/2017   Chronic active viral hepatitis B (HCC) 06/14/2014      Review of Systems  All other systems reviewed and are negative.     Objective:     Wt 127 lb (57.6 kg)   LMP 04/29/2022 (Exact Date)   BMI 25.65 kg/m    Physical Exam Constitutional:      Appearance: She is well-developed and normal weight.  Pulmonary:     Effort: Pulmonary effort is normal.  Neurological:     General: No focal deficit present.     Mental Status:  She is alert and oriented to person, place, and time.  Psychiatric:        Mood and Affect: Mood normal.        Speech: Speech normal.        Behavior: Behavior normal.        Cognition and Memory: Cognition normal.        Judgment: Judgment normal.      No results found for any visits on 05/14/22.    The ASCVD Risk score (Arnett DK, et al., 2019) failed to calculate for the following  reasons:   The 2019 ASCVD risk score is only valid for ages 36 to 80    Assessment & Plan:   Problem List Items Addressed This Visit       Unprioritized   Anxiety - Primary    We reviewed risks/benefits of SSRI's, will start 5 mg daily of lexapro. I will follow up with another video visit in 3 months to reassess her symptoms.       Relevant Medications   escitalopram (LEXAPRO) 5 MG tablet    Return in about 3 months (around 08/13/2022) for video visit for follow up on anxiety.    Karie Georges, MD

## 2022-05-23 DIAGNOSIS — Z6824 Body mass index (BMI) 24.0-24.9, adult: Secondary | ICD-10-CM | POA: Diagnosis not present

## 2022-05-23 DIAGNOSIS — R87610 Atypical squamous cells of undetermined significance on cytologic smear of cervix (ASC-US): Secondary | ICD-10-CM | POA: Diagnosis not present

## 2022-05-23 DIAGNOSIS — Z01419 Encounter for gynecological examination (general) (routine) without abnormal findings: Secondary | ICD-10-CM | POA: Diagnosis not present

## 2022-05-29 ENCOUNTER — Encounter: Payer: Self-pay | Admitting: Family Medicine

## 2022-05-30 ENCOUNTER — Ambulatory Visit: Payer: BC Managed Care – PPO | Admitting: Family Medicine

## 2022-05-30 ENCOUNTER — Encounter: Payer: Self-pay | Admitting: Family Medicine

## 2022-05-30 VITALS — BP 140/98 | HR 65 | Temp 98.2°F | Ht 59.0 in | Wt 130.2 lb

## 2022-05-30 DIAGNOSIS — N3000 Acute cystitis without hematuria: Secondary | ICD-10-CM | POA: Diagnosis not present

## 2022-05-30 DIAGNOSIS — R3915 Urgency of urination: Secondary | ICD-10-CM | POA: Diagnosis not present

## 2022-05-30 LAB — POC URINALSYSI DIPSTICK (AUTOMATED)
Bilirubin, UA: NEGATIVE
Blood, UA: NEGATIVE
Glucose, UA: NEGATIVE
Ketones, UA: NEGATIVE
Leukocytes, UA: NEGATIVE
Nitrite, UA: NEGATIVE
Protein, UA: NEGATIVE
Spec Grav, UA: 1.015 (ref 1.010–1.025)
Urobilinogen, UA: 0.2 E.U./dL
pH, UA: 6 (ref 5.0–8.0)

## 2022-05-30 MED ORDER — NITROFURANTOIN MONOHYD MACRO 100 MG PO CAPS: 100.0000 mg | ORAL_CAPSULE | Freq: Two times a day (BID) | ORAL | 0 refills | Status: AC

## 2022-05-30 NOTE — Progress Notes (Signed)
Established Patient Office Visit  Subjective   Patient ID: Kim Pacheco, female    DOB: 22-Apr-1986  Age: 36 y.o. MRN: [id number]  Chief Complaint  Patient presents with   Urinary Frequency    Patient complains of urinary urgency x2 days, taking Cephalexin as she has this for prophylactic treatment   Back Pain    Patient complains of low back pain x2 days    Pt is reporting she woke up at night 2 days ago with increasing frequency and some back and pelvic pain, pt reports she constantly feels like she has to go and nothing really comes out. No fever/chills, no nausea or vomiting, no diarrhea. Pt reports she took some cephalexin that she had left over, took 4 doses of this, states it helped some but she is still having the urgency to go.   Urinary Frequency  This is a new problem. The problem occurs every urination. The problem has been unchanged. She is Sexually active. There is No history of pyelonephritis. Associated symptoms include frequency and urgency. Pertinent negatives include no chills, hematuria, hesitancy, nausea or vomiting.  Back Pain      Review of Systems  Constitutional:  Negative for chills.  Gastrointestinal:  Negative for nausea and vomiting.  Genitourinary:  Positive for frequency and urgency. Negative for hematuria and hesitancy.  Musculoskeletal:  Positive for back pain.      Objective:     BP (!) 140/98 (BP Location: Left Arm, Patient Position: Sitting, Cuff Size: Normal)   Pulse 65   Temp 98.2 F (36.8 C) (Oral)   Ht 4\' 11"  (1.499 m)   Wt 130 lb 3.2 oz (59.1 kg)   LMP 04/29/2022 (Exact Date)   SpO2 100%   BMI 26.30 kg/m    Physical Exam Vitals reviewed.  Constitutional:      Appearance: Normal appearance. She is normal weight.  Cardiovascular:     Rate and Rhythm: Normal rate and regular rhythm.     Heart sounds: Normal heart sounds.  Pulmonary:     Effort: Pulmonary effort is normal.  Abdominal:     General: Bowel sounds  are normal.     Tenderness: There is no right CVA tenderness or left CVA tenderness.  Neurological:     Mental Status: She is alert.      Results for orders placed or performed in visit on 05/30/22  POCT Urinalysis Dipstick (Automated)  Result Value Ref Range   Color, UA yellow    Clarity, UA cloudy    Glucose, UA Negative Negative   Bilirubin, UA negative    Ketones, UA negative    Spec Grav, UA 1.015 1.010 - 1.025   Blood, UA negative    pH, UA 6.0 5.0 - 8.0   Protein, UA Negative Negative   Urobilinogen, UA 0.2 0.2 or 1.0 E.U./dL   Nitrite, UA negative    Leukocytes, UA Negative Negative      The ASCVD Risk score (Arnett DK, et al., 2019) failed to calculate for the following reasons:   The 2019 ASCVD risk score is only valid for ages 60 to 56    Assessment & Plan:   Problem List Items Addressed This Visit       Unprioritized   Urinary tract infectious disease   Relevant Medications   nitrofurantoin, macrocrystal-monohydrate, (MACROBID) 100 MG capsule   Other Relevant Orders   Culture, Urine  Patient took 1 day of cephalexin at home which is likely causing the negative  results of the UA test. Her symptoms are pathognomic for UTI, will treat with macrobid 100 mg BID for 7 days. Will send urine for culture to confirm susceptibility. Other Visit Diagnoses     Urinary urgency    -  Primary   Relevant Orders   POCT Urinalysis Dipstick (Automated) (Completed)       No follow-ups on file.    Karie Georges, MD

## 2022-05-31 ENCOUNTER — Encounter: Payer: Self-pay | Admitting: Family Medicine

## 2022-05-31 DIAGNOSIS — F419 Anxiety disorder, unspecified: Secondary | ICD-10-CM

## 2022-05-31 LAB — URINE CULTURE
MICRO NUMBER:: 14345834
Result:: NO GROWTH
SPECIMEN QUALITY:: ADEQUATE

## 2022-06-03 NOTE — Telephone Encounter (Signed)
Last OV 05/30/22

## 2022-06-04 MED ORDER — FLUOXETINE HCL 10 MG PO TABS: 10.0000 mg | ORAL_TABLET | Freq: Every day | ORAL | 0 refills | Status: DC

## 2022-06-04 NOTE — Addendum Note (Signed)
Addended by: Karie Georges on: 06/04/2022 11:02 AM   Modules accepted: Orders

## 2022-06-19 DIAGNOSIS — Z713 Dietary counseling and surveillance: Secondary | ICD-10-CM | POA: Diagnosis not present

## 2022-07-03 DIAGNOSIS — Z713 Dietary counseling and surveillance: Secondary | ICD-10-CM | POA: Diagnosis not present

## 2022-07-17 DIAGNOSIS — Z713 Dietary counseling and surveillance: Secondary | ICD-10-CM | POA: Diagnosis not present

## 2022-08-07 DIAGNOSIS — Z713 Dietary counseling and surveillance: Secondary | ICD-10-CM | POA: Diagnosis not present

## 2022-08-29 ENCOUNTER — Other Ambulatory Visit: Payer: Self-pay | Admitting: Family Medicine

## 2022-08-29 DIAGNOSIS — F419 Anxiety disorder, unspecified: Secondary | ICD-10-CM

## 2022-09-19 ENCOUNTER — Ambulatory Visit: Payer: BC Managed Care – PPO | Admitting: Family Medicine

## 2022-09-19 ENCOUNTER — Encounter: Payer: Self-pay | Admitting: Family Medicine

## 2022-09-19 VITALS — BP 122/70 | HR 66 | Temp 97.5°F | Ht 59.0 in | Wt 128.0 lb

## 2022-09-19 DIAGNOSIS — L02229 Furuncle of trunk, unspecified: Secondary | ICD-10-CM | POA: Diagnosis not present

## 2022-09-19 MED ORDER — SULFAMETHOXAZOLE-TRIMETHOPRIM 800-160 MG PO TABS: 1.0000 | ORAL_TABLET | Freq: Two times a day (BID) | ORAL | 0 refills | Status: AC

## 2022-09-19 NOTE — Progress Notes (Signed)
   Acute Office Visit  Subjective:     Patient ID: Kim Pacheco, female    DOB: 1985-10-12, 37 y.o.   MRN: [id number]  Chief Complaint  Patient presents with   ingrown hair pubic region x5 days    HPI Patient is in today for reports ingrown hair in the pubic region for the last 5 days, states that she used hot compresses and thinks that it popped and some drainage came out yesterday but it is still hurting and somewhat swollen. No fever or chills.   Review of Systems  All other systems reviewed and are negative.       Objective:    BP 122/70 (BP Location: Left Arm, Patient Position: Sitting, Cuff Size: Normal)   Pulse 66   Temp (!) 97.5 F (36.4 C) (Axillary)   Ht 4\' 11"  (1.499 m)   Wt 128 lb (58.1 kg)   LMP 08/30/2022 (Approximate)   SpO2 98%   BMI 25.85 kg/m    Physical Exam Constitutional:      Appearance: Normal appearance. She is normal weight.  Eyes:     Conjunctiva/sclera: Conjunctivae normal.  Skin:    Findings: Lesion present.     Comments: 1 cm furuncle lesion wit ha center ingrown hair on the mons pubis anteriorly in the midline. There is no fluctuance or induration, there is mild erythema, shallow ulceration in the center.  Neurological:     Mental Status: She is alert and oriented to person, place, and time. Mental status is at baseline.  Psychiatric:        Mood and Affect: Mood normal.        Behavior: Behavior normal.     No results found for any visits on 09/19/22.      Assessment & Plan:   Problem List Items Addressed This Visit   None Visit Diagnoses     Furuncle of pubic region    -  Primary   Relevant Medications   sulfamethoxazole-trimethoprim (BACTRIM DS) 800-160 MG tablet     Pt already drained most of it yesterday, will give a few days of abx to help it resolve. Pt was instructed to gently exfoliate the area to help loosen the hair from the follicle, also advised not to shave the hair too close to the skin in the  future  Meds ordered this encounter  Medications   sulfamethoxazole-trimethoprim (BACTRIM DS) 800-160 MG tablet    Sig: Take 1 tablet by mouth 2 (two) times daily for 4 days.    Dispense:  8 tablet    Refill:  0    No follow-ups on file.  Karie Georges, MD

## 2022-09-24 DIAGNOSIS — R0602 Shortness of breath: Secondary | ICD-10-CM | POA: Diagnosis not present

## 2022-09-24 DIAGNOSIS — R635 Abnormal weight gain: Secondary | ICD-10-CM | POA: Diagnosis not present

## 2022-09-24 DIAGNOSIS — Z131 Encounter for screening for diabetes mellitus: Secondary | ICD-10-CM | POA: Diagnosis not present

## 2022-09-24 DIAGNOSIS — Z3200 Encounter for pregnancy test, result unknown: Secondary | ICD-10-CM | POA: Diagnosis not present

## 2022-09-24 DIAGNOSIS — R5383 Other fatigue: Secondary | ICD-10-CM | POA: Diagnosis not present

## 2022-10-24 DIAGNOSIS — Z6821 Body mass index (BMI) 21.0-21.9, adult: Secondary | ICD-10-CM | POA: Diagnosis not present

## 2022-10-24 DIAGNOSIS — R030 Elevated blood-pressure reading, without diagnosis of hypertension: Secondary | ICD-10-CM | POA: Diagnosis not present

## 2022-10-24 DIAGNOSIS — R635 Abnormal weight gain: Secondary | ICD-10-CM | POA: Diagnosis not present

## 2022-11-07 ENCOUNTER — Encounter: Payer: Self-pay | Admitting: Family Medicine

## 2022-11-11 ENCOUNTER — Ambulatory Visit: Payer: BC Managed Care – PPO | Admitting: Family Medicine

## 2022-11-11 ENCOUNTER — Encounter: Payer: Self-pay | Admitting: Family Medicine

## 2022-11-11 VITALS — BP 102/78 | HR 91 | Temp 98.5°F | Wt 118.4 lb

## 2022-11-11 DIAGNOSIS — H00025 Hordeolum internum left lower eyelid: Secondary | ICD-10-CM

## 2022-11-11 NOTE — Progress Notes (Signed)
   Established Patient Office Visit   Subjective  Patient ID: Kim Pacheco, female    DOB: [birth year]  Age: 37 y.o. MRN: [id number]  Chief Complaint  Patient presents with   Stye    Noticed Friday, was doing warm compresses and it popped on Saturday, husband wanted her to come in and get it looked at. Left eye, feels a little tender when touches it     Patient is a 37 year old female followed by Dr. Casimiro Needle and seen for acute concern.  Patient endorses noticing a stye on left lower eyelid 3 days ago.  Patient apply warm compresses to eye which seem to improve symptoms.  Left lower eyelid still slightly sensitive to touch.  Denies drainage, conjunctival injection, crusting.  Patient does wear foundation and eye shadow, cleans brushes weekly.  Make-up is only a few months old.  Using soap and water to cleanse face.      ROS Negative unless stated above    Objective:     BP 102/78 (BP Location: Left Arm, Patient Position: Sitting, Cuff Size: Normal)   Pulse 91   Temp 98.5 F (36.9 C) (Oral)   Wt 118 lb 6.4 oz (53.7 kg)   SpO2 96%   BMI 23.91 kg/m    Physical Exam Constitutional:      General: She is not in acute distress.    Appearance: Normal appearance.  HENT:     Head: Normocephalic and atraumatic.     Nose: Nose normal.     Mouth/Throat:     Mouth: Mucous membranes are moist.  Eyes:     General: Lids are everted, no foreign bodies appreciated.        Left eye: Hordeolum present.    Comments: Mild edema of left lower eyelid.  Hordeolum internum of left lower eyelid with erythema and mild edema.  No pustule or drainage noted.  Cardiovascular:     Rate and Rhythm: Normal rate and regular rhythm.     Heart sounds: Normal heart sounds. No murmur heard.    No gallop.  Pulmonary:     Effort: Pulmonary effort is normal. No respiratory distress.     Breath sounds: No wheezing, rhonchi or rales.  Skin:    General: Skin is warm and dry.  Neurological:      Mental Status: She is alert and oriented to person, place, and time.      No results found for any visits on 11/11/22.    Assessment & Plan:  Hordeolum internum of left lower eyelid  Improvement in symptoms noted.  Patient advised to continue warm compresses 4 times daily.  Continue cleaning make-up brushes regularly.  Advised to use a gentle no tears baby shampoo to thoroughly remove eye make-up.  For continued or worsening symptoms refer to ophthalmology.  Return if symptoms worsen or fail to improve.   Deeann Saint, MD

## 2022-11-15 DIAGNOSIS — R635 Abnormal weight gain: Secondary | ICD-10-CM | POA: Diagnosis not present

## 2022-11-15 DIAGNOSIS — Z6821 Body mass index (BMI) 21.0-21.9, adult: Secondary | ICD-10-CM | POA: Diagnosis not present

## 2022-11-15 DIAGNOSIS — R030 Elevated blood-pressure reading, without diagnosis of hypertension: Secondary | ICD-10-CM | POA: Diagnosis not present

## 2022-12-01 ENCOUNTER — Encounter: Payer: Self-pay | Admitting: Family Medicine

## 2022-12-02 ENCOUNTER — Other Ambulatory Visit (HOSPITAL_BASED_OUTPATIENT_CLINIC_OR_DEPARTMENT_OTHER): Payer: Self-pay

## 2022-12-02 ENCOUNTER — Other Ambulatory Visit: Payer: Self-pay

## 2022-12-02 ENCOUNTER — Emergency Department (HOSPITAL_BASED_OUTPATIENT_CLINIC_OR_DEPARTMENT_OTHER)
Admission: EM | Admit: 2022-12-02 | Discharge: 2022-12-02 | Disposition: A | Discharge status: Home / Self Care | Payer: BC Managed Care – PPO | Attending: Emergency Medicine | Admitting: Emergency Medicine

## 2022-12-02 ENCOUNTER — Encounter (HOSPITAL_BASED_OUTPATIENT_CLINIC_OR_DEPARTMENT_OTHER): Payer: Self-pay | Admitting: Emergency Medicine

## 2022-12-02 DIAGNOSIS — R9431 Abnormal electrocardiogram [ECG] [EKG]: Secondary | ICD-10-CM | POA: Diagnosis not present

## 2022-12-02 DIAGNOSIS — T675XXA Heat exhaustion, unspecified, initial encounter: Secondary | ICD-10-CM | POA: Diagnosis not present

## 2022-12-02 DIAGNOSIS — R5383 Other fatigue: Secondary | ICD-10-CM | POA: Diagnosis not present

## 2022-12-02 DIAGNOSIS — R42 Dizziness and giddiness: Secondary | ICD-10-CM | POA: Diagnosis not present

## 2022-12-02 LAB — BASIC METABOLIC PANEL
Anion gap: 8 (ref 5–15)
BUN: 14 mg/dL (ref 6–20)
CO2: 26 mmol/L (ref 22–32)
Calcium: 9.4 mg/dL (ref 8.9–10.3)
Chloride: 104 mmol/L (ref 98–111)
Creatinine, Ser: 0.73 mg/dL (ref 0.44–1.00)
GFR, Estimated: >60 mL/min (ref >60)
Glucose, Bld: 91 mg/dL (ref 70–99)
Potassium: 3.7 mmol/L (ref 3.5–5.1)
Sodium: 138 mmol/L (ref 135–145)

## 2022-12-02 LAB — URINALYSIS, ROUTINE W REFLEX MICROSCOPIC
Bilirubin Urine: NEGATIVE
Glucose, UA: NEGATIVE mg/dL
Hgb urine dipstick: NEGATIVE
Leukocytes,Ua: NEGATIVE
Nitrite: NEGATIVE
Specific Gravity, Urine: 1.029 (ref 1.005–1.030)
pH: 5.5 (ref 5.0–8.0)

## 2022-12-02 LAB — CBC
HCT: 36.6 % (ref 36.0–46.0)
Hemoglobin: 12.5 g/dL (ref 12.0–15.0)
MCH: 31.3 pg (ref 26.0–34.0)
MCHC: 34.2 g/dL (ref 30.0–36.0)
MCV: 91.5 fL (ref 80.0–100.0)
Platelets: 221 10*3/uL (ref 150–400)
RBC: 4 MIL/uL (ref 3.87–5.11)
RDW: 12.4 % (ref 11.5–15.5)
WBC: 4.8 10*3/uL (ref 4.0–10.5)
nRBC: 0 % (ref 0.0–0.2)

## 2022-12-02 LAB — CK: Total CK: 158 U/L (ref 38–234)

## 2022-12-02 MED ORDER — SODIUM CHLORIDE 0.9 % IV BOLUS
500.0000 mL | Freq: Once | INTRAVENOUS | Status: AC
  Administered 2022-12-02: 500 mL via INTRAVENOUS

## 2022-12-02 MED ORDER — SODIUM CHLORIDE 0.9 % IV BOLUS
1000.0000 mL | Freq: Once | INTRAVENOUS | Status: AC
  Administered 2022-12-02: 1000 mL via INTRAVENOUS

## 2022-12-02 NOTE — ED Triage Notes (Signed)
Pt arrives to ED with c/o on-going fatigue, headache, mild confusion, and malaise since becoming overheated while working outside x2 days ago.

## 2022-12-02 NOTE — ED Notes (Signed)
Attempted IV x2 without success  

## 2022-12-02 NOTE — Discharge Instructions (Signed)
You were seen in the emergency department for fatigue and headaches. Your symptoms appear consistent with heat exhaustion given recent heat exposure. You were given fluids here in the ER which appeared to help your symptoms. Continue to stay hydrated and limit outdoor exposure as much as possible to reduce recurrence of your symptoms. If you feel that your symptoms are worsening, return to the ER. Otherwise, follow up with your primary care provider.

## 2022-12-02 NOTE — ED Notes (Signed)
Pt requesting more fluids before DC. Complains of being tired. This RN explains she will most likely not feel 100% today and stresses the importance of continued rest/hydration at home. Additional NS bolus ordered.

## 2022-12-02 NOTE — ED Provider Notes (Signed)
[facility] EMERGENCY DEPARTMENT AT [facility] Provider Note   CSN: [id number] Arrival date & time: 12/02/22  5366     History Chief Complaint  Patient presents with   Fatigue   Headache    Kim Pacheco is a 37 y.o. female.  Patient presents to the emergency department complaints of fatigue and headache.  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  States that she has been feeling increased fatigue over the last few days, but denies any fevers, nausea, vomiting, diarrhea.  States that she is trying to keep yourself hydrated at home with Gatorade but states that her urine is still coming out darker than typical.  Denies any urinary symptoms at this time such as dysuria or hematuria.  Endorse that she has previously had similar symptoms when she experienced a heat related illness episode followed by acute renal failure.   Headache      Home Medications Prior to Admission medications   Medication Sig Start Date End Date Taking? Authorizing Provider  albuterol (VENTOLIN HFA) 108 (90 Base) MCG/ACT inhaler Inhale 2 puffs into the lungs every 4 (four) hours as needed for wheezing or shortness of breath. 04/12/22   Karie Georges, MD  FLUoxetine (PROZAC) 10 MG tablet TAKE 1 TABLET BY MOUTH DAILY 08/29/22   Karie Georges, MD  VEMLIDY 25 MG TABS Take 1 tablet by mouth daily.    [provider]      Allergies    Contrast media [iodinated contrast media], Nsaids, Prednisone & diphenhydramine, and Prednisone    Review of Systems   Review of Systems  Neurological:  Positive for headaches.  All other systems reviewed and are negative.   Physical Exam Updated Vital Signs BP 115/80   Pulse 61   Temp 98.5 F (36.9 C) (Temporal)   Resp 16   Ht 4\' 11"  (1.499 m)   Wt 53.5 kg   SpO2 99%   BMI 23.83 kg/m  Physical Exam Vitals and nursing note reviewed.   Constitutional:      General: She is not in acute distress.    Appearance: She is well-developed.  HENT:     Head: Normocephalic and atraumatic.  Eyes:     Conjunctiva/sclera: Conjunctivae normal.  Cardiovascular:     Rate and Rhythm: Normal rate and regular rhythm.     Heart sounds: No murmur heard. Pulmonary:     Effort: Pulmonary effort is normal. No respiratory distress.     Breath sounds: Normal breath sounds.  Abdominal:     Palpations: Abdomen is soft.     Tenderness: There is no abdominal tenderness.  Musculoskeletal:        General: No swelling.     Cervical back: Neck supple.  Skin:    General: Skin is warm and dry.     Capillary Refill: Capillary refill takes less than 2 seconds.  Neurological:     Mental Status: She is alert.  Psychiatric:        Mood and Affect: Mood normal.     ED Results / Procedures / Treatments   Labs (all labs ordered are listed, but only abnormal results are displayed) Labs Reviewed  URINALYSIS, ROUTINE W REFLEX MICROSCOPIC - Abnormal; Notable for the following components:      Result Value   Ketones, ur TRACE (*)    Protein, ur TRACE (*)    All other  components within normal limits  CBC  BASIC METABOLIC PANEL  CK    EKG None  Radiology No results found.  Procedures Procedures   Medications Ordered in ED Medications  sodium chloride 0.9 % bolus 1,000 mL (0 mLs Intravenous Stopped 12/02/22 1153)  sodium chloride 0.9 % bolus 500 mL (0 mLs Intravenous Stopped 12/02/22 1451)    ED Course/ Medical Decision Making/ A&P                           Medical Decision Making Amount and/or Complexity of Data Reviewed Labs: ordered.   This patient presents to the ED for concern of fatigue, headache. Differential diagnosis includes heat-related illness, near syncope,    Lab Tests:  I Ordered, and personally interpreted labs.  The pertinent results include: CBC insists BMP unremarkable, UA without evidence of infection or  significant dehydration, CK normal   Medicines ordered and prescription drug management:  I ordered medication including fluids for heat exhaustion Reevaluation of the patient after these medicines showed that the patient improved I have reviewed the patients home medicines and have made adjustments as needed   Problem List / ED Course:  Patient presents to the emergency department complaints of fatigue and headache.  Reports that she was outdoors for prolonged periods of time this weekend.  Prior history of heat related illness.  States that she been try to stay hydrated but reports that her urine is still coming out darker than typical for her.  Denies any fevers, nausea, vomiting, diarrhea.  Also endorsing some tightness in her chest.  Will evaluate with CBC, BMP, UA, CK, EKG.  Will administer fluid resuscitation at this time as well as patient's blood pressure is soft.  Will reassess patient shortly. Patient's labs are largely unremarkable for any acute abnormality or significant electrolyte derangements.  Patient endorses significant improvement with 1 L of fluid bolus administered.  Will provide another 0.5 L of fluids for further evaluation and management. Patient again reports that she feels improvement with administration of fluids.  Encourage patient she is safe for discharge home with management of symptoms at home by increasing oral intake of fluids to remain hydrated.  Also advised patient to follow-up with primary care provider or return to the emergency department as needed.  Patient is agreeable to treatment plan and verbalized understanding all return precautions.  Final Clinical Impression(s) / ED Diagnoses Final diagnoses:  Heat exhaustion, initial encounter    Rx / DC Orders ED Discharge Orders     None         Smitty Knudsen, PA-C 12/02/22 1456    Rolan Bucco, MD 12/02/22 1513

## 2022-12-24 ENCOUNTER — Other Ambulatory Visit: Payer: Self-pay | Admitting: Family Medicine

## 2022-12-24 DIAGNOSIS — R635 Abnormal weight gain: Secondary | ICD-10-CM | POA: Diagnosis not present

## 2022-12-24 DIAGNOSIS — Z6821 Body mass index (BMI) 21.0-21.9, adult: Secondary | ICD-10-CM | POA: Diagnosis not present

## 2022-12-24 DIAGNOSIS — F419 Anxiety disorder, unspecified: Secondary | ICD-10-CM

## 2022-12-24 DIAGNOSIS — R030 Elevated blood-pressure reading, without diagnosis of hypertension: Secondary | ICD-10-CM | POA: Diagnosis not present

## 2022-12-27 ENCOUNTER — Encounter: Payer: Self-pay | Admitting: Family Medicine

## 2022-12-27 DIAGNOSIS — U071 COVID-19: Secondary | ICD-10-CM | POA: Diagnosis not present

## 2023-01-14 DIAGNOSIS — Z720 Tobacco use: Secondary | ICD-10-CM | POA: Diagnosis not present

## 2023-01-24 DIAGNOSIS — R635 Abnormal weight gain: Secondary | ICD-10-CM | POA: Diagnosis not present

## 2023-01-24 DIAGNOSIS — R030 Elevated blood-pressure reading, without diagnosis of hypertension: Secondary | ICD-10-CM | POA: Diagnosis not present

## 2023-01-24 DIAGNOSIS — Z6821 Body mass index (BMI) 21.0-21.9, adult: Secondary | ICD-10-CM | POA: Diagnosis not present

## 2023-01-24 DIAGNOSIS — Z6820 Body mass index (BMI) 20.0-20.9, adult: Secondary | ICD-10-CM | POA: Diagnosis not present

## 2023-02-24 DIAGNOSIS — R635 Abnormal weight gain: Secondary | ICD-10-CM | POA: Diagnosis not present

## 2023-02-24 DIAGNOSIS — R030 Elevated blood-pressure reading, without diagnosis of hypertension: Secondary | ICD-10-CM | POA: Diagnosis not present

## 2023-02-24 DIAGNOSIS — Z6820 Body mass index (BMI) 20.0-20.9, adult: Secondary | ICD-10-CM | POA: Diagnosis not present

## 2023-03-12 ENCOUNTER — Encounter: Payer: Self-pay | Admitting: Family Medicine

## 2023-03-12 ENCOUNTER — Ambulatory Visit: Payer: BC Managed Care – PPO | Admitting: Family Medicine

## 2023-03-12 VITALS — BP 124/78 | HR 74 | Temp 98.2°F | Wt 116.0 lb

## 2023-03-12 DIAGNOSIS — M5412 Radiculopathy, cervical region: Secondary | ICD-10-CM | POA: Diagnosis not present

## 2023-03-12 MED ORDER — METHYLPREDNISOLONE ACETATE 40 MG/ML IJ SUSP
40.0000 mg | Freq: Once | INTRAMUSCULAR | Status: AC
Start: 2023-03-12 — End: 2023-03-12
  Administered 2023-03-12: 40 mg via INTRAMUSCULAR

## 2023-03-12 MED ORDER — METHOCARBAMOL 500 MG PO TABS: 500.0000 mg | ORAL_TABLET | Freq: Four times a day (QID) | ORAL | 0 refills | Status: DC | PRN

## 2023-03-12 MED ORDER — METHYLPREDNISOLONE ACETATE 80 MG/ML IJ SUSP
80.0000 mg | Freq: Once | INTRAMUSCULAR | Status: AC
Start: 2023-03-12 — End: 2023-03-12
  Administered 2023-03-12: 80 mg via INTRAMUSCULAR

## 2023-03-12 NOTE — Progress Notes (Signed)
Subjective:    Patient ID: Kim Pacheco, female    DOB: Aug 21, 1985, 37 y.o.   MRN: [id number]  HPI Here for one week of sharp pain in the left posterior neck that shoots down to the left shoulder. No recent trauma. She woke up one morning with this pain. She has tried applying ice and taking Ibuprofen with no relief. She even saw her chiropractor for a treatment, and this did not help.    Review of Systems  Constitutional: Negative.   Respiratory: Negative.    Cardiovascular: Negative.   Musculoskeletal:  Positive for neck pain and neck stiffness.       Objective:   Physical Exam Constitutional:      General: She is not in acute distress.    Appearance: Normal appearance.  Neck:     Comments: She is tender along the left posterior neck and over the left trapezius . ROM of the neck is limited by pain Cardiovascular:     Rate and Rhythm: Normal rate and regular rhythm.     Pulses: Normal pulses.     Heart sounds: Normal heart sounds.  Pulmonary:     Effort: Pulmonary effort is normal.     Breath sounds: Normal breath sounds.  Neurological:     Mental Status: She is alert.           Assessment & Plan:  Cervical radiculopathy. She will switch to applying heat. She is given a shot of DepoMedrol, and she can take Methocarbamol as needed.  Gershon Crane, MD

## 2023-03-12 NOTE — Addendum Note (Signed)
Addended by: Carola Rhine on: 03/12/2023 03:39 PM   Modules accepted: Orders

## 2023-03-18 NOTE — Addendum Note (Signed)
Addended by: Gershon Crane A on: 03/18/2023 02:10 PM   Modules accepted: Orders

## 2023-03-26 DIAGNOSIS — Z6820 Body mass index (BMI) 20.0-20.9, adult: Secondary | ICD-10-CM | POA: Diagnosis not present

## 2023-03-26 DIAGNOSIS — R030 Elevated blood-pressure reading, without diagnosis of hypertension: Secondary | ICD-10-CM | POA: Diagnosis not present

## 2023-03-26 DIAGNOSIS — R635 Abnormal weight gain: Secondary | ICD-10-CM | POA: Diagnosis not present

## 2023-04-01 ENCOUNTER — Telehealth: Payer: Self-pay | Admitting: *Deleted

## 2023-04-01 DIAGNOSIS — F419 Anxiety disorder, unspecified: Secondary | ICD-10-CM

## 2023-04-01 MED ORDER — FLUOXETINE HCL 10 MG PO TABS
10.0000 mg | ORAL_TABLET | Freq: Every day | ORAL | 0 refills | Status: DC
Start: 2023-04-01 — End: 2023-05-07

## 2023-04-01 NOTE — Telephone Encounter (Signed)
Rx done. 

## 2023-04-08 ENCOUNTER — Encounter: Payer: Self-pay | Admitting: Family Medicine

## 2023-04-09 ENCOUNTER — Ambulatory Visit
Admission: RE | Admit: 2023-04-09 | Discharge: 2023-04-09 | Disposition: A | Discharge status: Home / Self Care | Payer: BC Managed Care – PPO | Source: Ambulatory Visit | Attending: Family Medicine | Admitting: Family Medicine

## 2023-04-09 DIAGNOSIS — M5412 Radiculopathy, cervical region: Secondary | ICD-10-CM | POA: Diagnosis not present

## 2023-04-16 ENCOUNTER — Ambulatory Visit: Payer: BC Managed Care – PPO | Admitting: Family Medicine

## 2023-04-16 ENCOUNTER — Encounter: Payer: Self-pay | Admitting: Family Medicine

## 2023-04-16 DIAGNOSIS — G959 Disease of spinal cord, unspecified: Secondary | ICD-10-CM

## 2023-04-17 NOTE — Telephone Encounter (Signed)
Reviewed MRI results with pt verbalized udnerstanding

## 2023-04-18 ENCOUNTER — Emergency Department (HOSPITAL_BASED_OUTPATIENT_CLINIC_OR_DEPARTMENT_OTHER)
Admission: EM | Admit: 2023-04-18 | Discharge: 2023-04-18 | Disposition: A | Discharge status: Home / Self Care | Payer: BC Managed Care – PPO | Attending: Emergency Medicine | Admitting: Emergency Medicine

## 2023-04-18 ENCOUNTER — Encounter (HOSPITAL_BASED_OUTPATIENT_CLINIC_OR_DEPARTMENT_OTHER): Payer: Self-pay | Admitting: *Deleted

## 2023-04-18 ENCOUNTER — Other Ambulatory Visit: Payer: Self-pay

## 2023-04-18 DIAGNOSIS — R112 Nausea with vomiting, unspecified: Secondary | ICD-10-CM | POA: Insufficient documentation

## 2023-04-18 DIAGNOSIS — R1030 Lower abdominal pain, unspecified: Secondary | ICD-10-CM | POA: Diagnosis not present

## 2023-04-18 DIAGNOSIS — E119 Type 2 diabetes mellitus without complications: Secondary | ICD-10-CM | POA: Insufficient documentation

## 2023-04-18 DIAGNOSIS — R197 Diarrhea, unspecified: Secondary | ICD-10-CM | POA: Insufficient documentation

## 2023-04-18 LAB — URINALYSIS, ROUTINE W REFLEX MICROSCOPIC
Bacteria, UA: NONE SEEN
Bilirubin Urine: NEGATIVE
Glucose, UA: NEGATIVE mg/dL
Ketones, ur: NEGATIVE mg/dL
Leukocytes,Ua: NEGATIVE
Nitrite: NEGATIVE
Protein, ur: NEGATIVE mg/dL
Specific Gravity, Urine: 1.017 (ref 1.005–1.030)
pH: 5 (ref 5.0–8.0)

## 2023-04-18 LAB — PREGNANCY, URINE: Preg Test, Ur: NEGATIVE

## 2023-04-18 MED ORDER — LOPERAMIDE HCL 2 MG PO CAPS
4.0000 mg | ORAL_CAPSULE | Freq: Once | ORAL | Status: AC
  Administered 2023-04-18: 4 mg via ORAL
  Filled 2023-04-18: qty 2

## 2023-04-18 MED ORDER — DICYCLOMINE HCL 20 MG PO TABS: 20.0000 mg | ORAL_TABLET | Freq: Two times a day (BID) | ORAL | 0 refills | Status: DC | PRN

## 2023-04-18 MED ORDER — ACETAMINOPHEN 500 MG PO TABS
1000.0000 mg | ORAL_TABLET | Freq: Once | ORAL | Status: AC
  Administered 2023-04-18: 1000 mg via ORAL
  Filled 2023-04-18: qty 2

## 2023-04-18 MED ORDER — ONDANSETRON 4 MG PO TBDP: 4.0000 mg | ORAL_TABLET | Freq: Three times a day (TID) | ORAL | 0 refills | Status: DC | PRN

## 2023-04-18 MED ORDER — DICYCLOMINE HCL 10 MG PO CAPS
10.0000 mg | ORAL_CAPSULE | Freq: Once | ORAL | Status: AC
  Administered 2023-04-18: 10 mg via ORAL
  Filled 2023-04-18: qty 1

## 2023-04-18 MED ORDER — ONDANSETRON 4 MG PO TBDP
4.0000 mg | ORAL_TABLET | Freq: Once | ORAL | Status: AC
  Administered 2023-04-18: 4 mg via ORAL
  Filled 2023-04-18: qty 1

## 2023-04-18 NOTE — ED Notes (Signed)
Reviewed AVS with patient, patient expressed understanding of directions, denies further questions at this time. 

## 2023-04-18 NOTE — ED Notes (Signed)
Pt complains of worsened abdominal pain after vomiting. MD Horton notified, tylenol ordered, states proceed with discharge.

## 2023-04-18 NOTE — Addendum Note (Signed)
Addended by: Gershon Crane A on: 04/18/2023 01:01 PM   Modules accepted: Orders

## 2023-04-18 NOTE — ED Notes (Signed)
Patient states unable to provide urine specimen at this time as she used the restroom very recently.

## 2023-04-18 NOTE — ED Notes (Signed)
Patient reports uterus pain, requesting IV fluids, MD Horton notified.

## 2023-04-18 NOTE — Discharge Instructions (Signed)
You were seen today for nausea, vomiting, and diarrhea.  This is likely viral in nature.  Make sure to take medications as prescribed.  Make sure that you are staying hydrated.

## 2023-04-18 NOTE — ED Notes (Signed)
Provided patient with [facility] for oral rehydration, instructed patient to allow approx 15 minutes for zofran to help with nausea and then begin drinking fluids with small sips at first and continuing as tolerated. Provided patient with cup for urine specimen collection. Instructed patient to use call light upon completion of fluids, collection of urine specimen, or intolerance of oral rehydration.

## 2023-04-18 NOTE — ED Triage Notes (Signed)
Pt reports around midnight she started experiencing chills, nausea, constipation, diarrhea and lower abdominal pain.

## 2023-04-18 NOTE — ED Provider Notes (Signed)
[place] EMERGENCY DEPARTMENT AT [facility] Provider Note   CSN: [id number] Arrival date & time: 04/18/23  0304     History  Chief Complaint  Patient presents with   Nausea    Kim Pacheco is a 37 y.o. female.  HPI     This is a 37 year old female who presents with nausea, vomiting, and diarrhea.  Onset of symptoms around 1 AM.  Patient is concerned that she may have eaten some bad pork that was undercooked for dinner.  She reports crampy lower abdominal pain.  Reports multiple episodes of nonbilious, nonbloody emesis and nonbloody diarrhea.  No known sick contacts.  No fevers.  Home Medications Prior to Admission medications   Medication Sig Start Date End Date Taking? Authorizing Provider  dicyclomine (BENTYL) 20 MG tablet Take 1 tablet (20 mg total) by mouth 2 (two) times daily as needed for spasms. 04/18/23  Yes Marnell Mcdaniel, Mayer Masker, MD  ondansetron (ZOFRAN-ODT) 4 MG disintegrating tablet Take 1 tablet (4 mg total) by mouth every 8 (eight) hours as needed. 04/18/23  Yes Jalisa Sacco, Mayer Masker, MD  albuterol (VENTOLIN HFA) 108 (90 Base) MCG/ACT inhaler Inhale 2 puffs into the lungs every 4 (four) hours as needed for wheezing or shortness of breath. 04/12/22   Karie Georges, MD  FLUoxetine (PROZAC) 10 MG tablet Take 1 tablet (10 mg total) by mouth daily. 04/01/23   Karie Georges, MD  methocarbamol (ROBAXIN) 500 MG tablet Take 1 tablet (500 mg total) by mouth every 6 (six) hours as needed for muscle spasms. 03/12/23   Nelwyn Salisbury, MD      Allergies    Contrast media [iodinated contrast media], Nsaids, Prednisone & diphenhydramine, and Prednisone    Review of Systems   Review of Systems  Constitutional:  Negative for fever.  Respiratory:  Negative for shortness of breath.   Cardiovascular:  Negative for chest pain.  Gastrointestinal:  Positive for abdominal pain, diarrhea, nausea and vomiting.  All other systems reviewed and are  negative.   Physical Exam Updated Vital Signs BP 116/82   Pulse 64   Temp 97.7 F (36.5 C) (Oral)   Resp 16   Wt 53 kg   LMP 04/14/2023   SpO2 99%   BMI 23.60 kg/m  Physical Exam Vitals and nursing note reviewed.  Constitutional:      Appearance: She is well-developed. She is not ill-appearing.  HENT:     Head: Normocephalic and atraumatic.  Eyes:     Pupils: Pupils are equal, round, and reactive to light.  Cardiovascular:     Rate and Rhythm: Normal rate and regular rhythm.     Heart sounds: Normal heart sounds.  Pulmonary:     Effort: Pulmonary effort is normal. No respiratory distress.     Breath sounds: No wheezing.  Abdominal:     Palpations: Abdomen is soft.     Tenderness: There is no abdominal tenderness. There is no guarding or rebound.     Comments: Increased bowel sounds  Musculoskeletal:     Cervical back: Neck supple.  Skin:    General: Skin is warm and dry.  Neurological:     Mental Status: She is alert and oriented to person, place, and time.  Psychiatric:        Mood and Affect: Mood normal.     ED Results / Procedures / Treatments   Labs (all labs ordered are listed, but only abnormal results are displayed) Labs Reviewed  URINALYSIS, ROUTINE  W REFLEX MICROSCOPIC - Abnormal; Notable for the following components:      Result Value   Hgb urine dipstick MODERATE (*)    All other components within normal limits  PREGNANCY, URINE    EKG None  Radiology No results found.  Procedures Procedures    Medications Ordered in ED Medications  ondansetron (ZOFRAN-ODT) disintegrating tablet 4 mg (4 mg Oral Given 04/18/23 0333)  loperamide (IMODIUM) capsule 4 mg (4 mg Oral Given 04/18/23 0333)  dicyclomine (BENTYL) capsule 10 mg (10 mg Oral Given 04/18/23 0454)    ED Course/ Medical Decision Making/ A&P                                 Medical Decision Making Amount and/or Complexity of Data Reviewed Labs: ordered.  Risk Prescription drug  management.   This patient presents to the ED for concern of nausea, vomiting, diarrhea, this involves an extensive number of treatment options, and is a complaint that carries with it a high risk of complications and morbidity.  I considered the following differential and admission for this acute, potentially life threatening condition.  The differential diagnosis includes viral illness such as viral gastroenteritis, gastritis, less likely pancreatitis, cholecystitis, appendicitis  MDM:    This is a 37 year old female who presents with nausea, vomiting, diarrhea.  Onset several hours ago.  She is nontoxic and vital signs are reassuring.  She has benign abdominal exam.  She does have some increased bowel sounds.  Highly suspect foodborne illness versus viral etiology.  Patient was given Zofran and Bentyl.  She was able to orally hydrate without difficulty.  Urine pregnancy negative.  Urinalysis without evidence of infection or significant dehydration.  Recommend ongoing supportive measures at home.  (Labs, imaging, consults)  Labs: I Ordered, and personally interpreted labs.  The pertinent results include: Urinalysis, urine pregnancy  Imaging Studies ordered: I ordered imaging studies including none I independently visualized and interpreted imaging. I agree with the radiologist interpretation  Additional history obtained from chart review.  External records from outside source obtained and reviewed including prior evaluations  Cardiac Monitoring: The patient was maintained on a cardiac monitor.  If on the cardiac monitor, I personally viewed and interpreted the cardiac monitored which showed an underlying rhythm of: Sinus  Reevaluation: After the interventions noted above, I reevaluated the patient and found that they have :improved  Social Determinants of Health:  lives independently  Disposition: Discharge  Co morbidities that complicate the patient evaluation  Past Medical  History:  Diagnosis Date   Abdominal pain    started in mid abd and spread to left side/on and off/since Jan 2020/ pt states, she has fluid in abd/unknown   Abnormal Pap smear    normal 06/2013   Anxiety    Constipation    hx of   Depression    suicide attempt in college   Diabetes mellitus without complication (HCC)    gestational, no issues since pregnancy per her report - resolved   Gestational diabetes    /no meds now - resolved   Hepatitis    Hep B, managed by Dr. Kinnie Scales   History of chicken pox    History of suicide attempt 2003   Attempt in college   History of UTI    Vaginal Pap smear, abnormal      Medicines Meds ordered this encounter  Medications   ondansetron (ZOFRAN-ODT) disintegrating tablet 4 mg  loperamide (IMODIUM) capsule 4 mg   dicyclomine (BENTYL) capsule 10 mg   ondansetron (ZOFRAN-ODT) 4 MG disintegrating tablet    Sig: Take 1 tablet (4 mg total) by mouth every 8 (eight) hours as needed.    Dispense:  20 tablet    Refill:  0   dicyclomine (BENTYL) 20 MG tablet    Sig: Take 1 tablet (20 mg total) by mouth 2 (two) times daily as needed for spasms.    Dispense:  20 tablet    Refill:  0    I have reviewed the patients home medicines and have made adjustments as needed  Problem List / ED Course: Problem List Items Addressed This Visit   None Visit Diagnoses     Nausea vomiting and diarrhea    -  Primary                   Final Clinical Impression(s) / ED Diagnoses Final diagnoses:  Nausea vomiting and diarrhea    Rx / DC Orders ED Discharge Orders          Ordered    ondansetron (ZOFRAN-ODT) 4 MG disintegrating tablet  Every 8 hours PRN        04/18/23 0528    dicyclomine (BENTYL) 20 MG tablet  2 times daily PRN        04/18/23 0528              Shon Baton, MD 04/18/23 0532

## 2023-04-19 ENCOUNTER — Encounter: Payer: Self-pay | Admitting: Family Medicine

## 2023-04-22 ENCOUNTER — Encounter: Payer: Self-pay | Admitting: Adult Health

## 2023-04-22 ENCOUNTER — Ambulatory Visit: Payer: BC Managed Care – PPO | Admitting: Adult Health

## 2023-04-22 VITALS — BP 108/70 | HR 62 | Temp 98.1°F | Ht 59.0 in | Wt 114.0 lb

## 2023-04-22 DIAGNOSIS — Z77120 Contact with and (suspected) exposure to mold (toxic): Secondary | ICD-10-CM

## 2023-04-22 DIAGNOSIS — K5900 Constipation, unspecified: Secondary | ICD-10-CM

## 2023-04-22 DIAGNOSIS — R1084 Generalized abdominal pain: Secondary | ICD-10-CM

## 2023-04-22 NOTE — Patient Instructions (Addendum)
Please contact Dr. Coletta Memos at [facility]  [phone]   You can pick up a bottle of Magnesium Citrate and mix with Sprite or 7 up or drink a cup of warm prune juice mixed with two table spoons of olive oil to help with the constipation

## 2023-04-22 NOTE — Progress Notes (Signed)
Subjective:    Patient ID: Kim Pacheco, female    DOB: November 21, 1985, 37 y.o.   MRN: [id number]  HPI 37 year old female who  has a past medical history of Abdominal pain, Abnormal Pap smear, Anxiety, Constipation, Depression, Diabetes mellitus without complication (HCC), Gestational diabetes, Hepatitis, History of chicken pox, History of suicide attempt (2003), History of UTI, and Vaginal Pap smear, abnormal.  She is a patient of Dr. Casimiro Needle who I am seeing today for an acute issue.   She was seen at ER on Thursday for nausea and vomiting and diagnosed with viral gastroenteritis versus foodborne illness. In the ER she was given a dose of Imodium, Bentyl and Zofran. She was  prescribed Zofran and Bentyl to go home with.    The next day she was drinking out of her refillable water bottle and noticed that there was mold around the rim of the water bottle. She is unsure of how long the mold has been present   She has not had any nausea, vomiting, or diarrhea since but she has been constipated and has not had a bowel movement since she was at the ER. She has not used any of the prescribed Bentyl or Zofran. She has associated abdominal cramping/pain. She is passing gas   Review of Systems See HPI   Past Medical History:  Diagnosis Date   Abdominal pain    started in mid abd and spread to left side/on and off/since Jan 2020/ pt states, she has fluid in abd/unknown   Abnormal Pap smear    normal 06/2013   Anxiety    Constipation    hx of   Depression    suicide attempt in college   Diabetes mellitus without complication (HCC)    gestational, no issues since pregnancy per her report - resolved   Gestational diabetes    /no meds now - resolved   Hepatitis    Hep B, managed by Dr. Kinnie Scales   History of chicken pox    History of suicide attempt 2003   Attempt in college   History of UTI    Vaginal Pap smear, abnormal     Social History   Socioeconomic History   Marital  status: Married    Spouse name: Not on file   Number of children: 1   Years of education: college   Highest education level: Associate degree: occupational, Scientist, product/process development, or vocational program  Occupational History   Occupation: Environmental manager  Tobacco Use   Smoking status: Former    Current packs/day: 0.00    Types: Cigarettes    Quit date: 07/23/2011    Years since quitting: 11.7   Smokeless tobacco: Never   Tobacco comments:    smoked for 8 years, quit in 2013  Vaping Use   Vaping status: Never Used  Substance and Sexual Activity   Alcohol use: Yes    Alcohol/week: 3.0 standard drinks of alcohol    Types: 3 Glasses of wine per week    Comment: 3 glasses of wine per day   Drug use: Not Currently    Types: Marijuana    Comment: Last use 12/19/18   Sexual activity: Yes  Other Topics Concern   Not on file  Social History Narrative   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  FHx unknown   Social Determinants of Health   Financial Resource Strain: Low Risk  (09/18/2022)   Overall Financial Resource Strain (CARDIA)    Difficulty of Paying Living Expenses: Not hard at all  Food Insecurity: No Food Insecurity (09/18/2022)   Hunger Vital Sign    Worried About Running Out of Food in the Last Year: Never true    Ran Out of Food in the Last Year: Never true  Transportation Needs: No Transportation Needs (09/18/2022)   PRAPARE - Administrator, Civil Service (Medical): No    Lack of Transportation (Non-Medical): No  Physical Activity: Unknown (09/18/2022)   Exercise Vital Sign    Days of Exercise per Week: 0 days    Minutes of Exercise per Session: Not on file  Stress: No Stress Concern Present (09/18/2022)   [employer] of Occupational Health - Occupational  Stress Questionnaire    Feeling of Stress : Only a little  Social Connections: Unknown (09/18/2022)   Social Connection and Isolation Panel [NHANES]    Frequency of Communication with Friends and Family: Patient declined    Frequency of Social Gatherings with Friends and Family: More than three times a week    Attends Religious Services: More than 4 times per year    Active Member of Clubs or Organizations: Patient declined    Attends Banker Meetings: Not on file    Marital Status: Married  Intimate Partner Violence: Unknown (11/22/2021)   Received from [employer]   HITS    Physically Hurt: Not on file    Insult or Talk Down To: Not on file    Threaten Physical Harm: Not on file    Scream or Curse: Not on file    Past Surgical History:  Procedure Laterality Date   BIOPSY  05/10/2020   Procedure: BIOPSY;  Surgeon: Benancio Deeds, MD;  Location: [facility];  Service: Gastroenterology;;   BLADDER REPAIR N/A 05/17/2020   Procedure: EXPLORATORY LAPAROTOMY WITH CYSTORRHAPHY;  Surgeon: Marcine Matar, MD;  Location: [facility];  Service: Urology;  Laterality: N/A;   CESAREAN SECTION N/A 01/26/2013   Procedure: Primary Cesarean Section Delivery Baby  Boy @ 0017, Apgars 4/8/9;  Surgeon: Freddrick March. Tenny Craw, MD;  Location: [facility];  Service: Obstetrics;  Laterality: N/A;   CESAREAN SECTION N/A 11/18/2017   Procedure: CESAREAN SECTION;  Surgeon: Olivia Mackie, MD;  Location: [facility];  Service: Obstetrics;  Laterality: N/A;   COLONOSCOPY  2020   DIAGNOSTIC LAPAROSCOPIC LIVER BIOPSY N/A 05/07/2020   Procedure: DIAGNOSTIC LAPAROSCOPIC;  Surgeon: Karie Soda, MD;  Location: [facility];  Service: General;  Laterality: N/A;   DIAGNOSTIC LAPAROSCOPY  08/25/2018   Dr Clifton James with Ob/Gyn3/2020   ESOPHAGOGASTRODUODENOSCOPY (EGD) WITH PROPOFOL N/A 05/10/2020   Procedure: ESOPHAGOGASTRODUODENOSCOPY (EGD) WITH PROPOFOL;  Surgeon: Benancio Deeds, MD;  Location:  [facility];  Service: Gastroenterology;  Laterality: N/A;   ESOPHAGOGASTRODUODENOSCOPY ENDOSCOPY     IR TRANSCATHETER BX  04/18/2020   IR US GUIDE VASC ACCESS RIGHT  04/18/2020   IR VENOGRAM HEPATIC W HEMODYNAMIC EVALUATION  04/18/2020   NASAL SINUS SURGERY  1996   WISDOM TOOTH EXTRACTION      Family History  Adopted: Yes  Family history unknown: Yes    Allergies  Allergen Reactions   Contrast Media [Iodinated Contrast Media] Anaphylaxis, Shortness Of Breath and Rash  IV contrast    Nsaids Other (See Comments)    Hx gastritis & gastric perforation    Prednisone & Diphenhydramine Other (See Comments)    Don't like to take it   Prednisone     Other reaction(s): Other Don't like to take it    Current Outpatient Medications on File Prior to Visit  Medication Sig Dispense Refill   FLUoxetine (PROZAC) 10 MG tablet Take 1 tablet (10 mg total) by mouth daily. 30 tablet 0   methocarbamol (ROBAXIN) 500 MG tablet Take 1 tablet (500 mg total) by mouth every 6 (six) hours as needed for muscle spasms. 60 tablet 0   No current facility-administered medications on file prior to visit.    BP 108/70   Pulse 62   Temp 98.1 F (36.7 C) (Oral)   Ht 4\' 11"  (1.499 m)   Wt 114 lb (51.7 kg)   LMP 04/14/2023   SpO2 99%   BMI 23.03 kg/m       Objective:   Physical Exam Vitals and nursing note reviewed.  Constitutional:      Appearance: Normal appearance.  Cardiovascular:     Pulses: Normal pulses.     Heart sounds: Normal heart sounds.  Pulmonary:     Effort: Pulmonary effort is normal.     Breath sounds: Normal breath sounds.  Abdominal:     General: Abdomen is flat. Bowel sounds are normal.     Palpations: Abdomen is soft.     Tenderness: There is generalized abdominal tenderness.  Musculoskeletal:        General: Normal range of motion.  Skin:    General: Skin is warm and dry.  Neurological:     General: No focal deficit present.     Mental Status: She is alert and  oriented to person, place, and time.  Psychiatric:        Mood and Affect: Mood normal.        Behavior: Behavior normal.        Thought Content: Thought content normal.        Judgment: Judgment normal.       Assessment & Plan:  1. Mold exposure - Unsure if this would have caused her symptoms  - She has ordered a new water bottle.   2. Generalized abdominal pain - From constipation   3. Constipation, unspecified constipation type - Normoactive bowel sounds in all quadrants.  - She can try drinking Miralax or use Magnesium Citrate or Warm prune juice  - Follow up as needed  Time spent with patient today was 34 minutes which consisted of chart review, discussing mold exposure, constipation and abdominal pain, work up, treatment answering questions and documentation.   Shirline Frees, NP

## 2023-04-24 DIAGNOSIS — M5412 Radiculopathy, cervical region: Secondary | ICD-10-CM | POA: Diagnosis not present

## 2023-04-25 DIAGNOSIS — Z6820 Body mass index (BMI) 20.0-20.9, adult: Secondary | ICD-10-CM | POA: Diagnosis not present

## 2023-04-25 DIAGNOSIS — R030 Elevated blood-pressure reading, without diagnosis of hypertension: Secondary | ICD-10-CM | POA: Diagnosis not present

## 2023-04-25 DIAGNOSIS — R635 Abnormal weight gain: Secondary | ICD-10-CM | POA: Diagnosis not present

## 2023-04-28 ENCOUNTER — Encounter: Payer: Self-pay | Admitting: Family Medicine

## 2023-04-30 ENCOUNTER — Encounter: Payer: Self-pay | Admitting: Family Medicine

## 2023-04-30 DIAGNOSIS — M5412 Radiculopathy, cervical region: Secondary | ICD-10-CM

## 2023-05-01 NOTE — Telephone Encounter (Signed)
Message already sent to Dr Clent Ridges for advise

## 2023-05-01 NOTE — Telephone Encounter (Signed)
I can certainly do that, but first I would like to read Dr. Lindalou Hose report

## 2023-05-05 ENCOUNTER — Telehealth: Payer: Self-pay

## 2023-05-05 NOTE — Telephone Encounter (Signed)
Transition Care Management Follow-up Telephone Call Date of discharge and from where: 04/18/2023 [facility] How have you been since you were released from the hospital? Patient stated she is feeling much better. Any questions or concerns? No  Items Reviewed: Did the pt receive and understand the discharge instructions provided? Yes  Medications obtained and verified? Yes  Other? No  Any new allergies since your discharge? No  Dietary orders reviewed? Yes Do you have support at home? Yes   Follow up appointments reviewed:  PCP Hospital f/u appt confirmed?  Patient followed up via p hone call.  Scheduled to see Vinetta Bergamo. Casimiro Needle, MD on 04/19/2023 @ [facility] at [place]. Specialist Hospital f/u appt confirmed? Yes  Scheduled to see Shirline Frees, NP on 04/22/2023 @ [facility] at [place]. Are transportation arrangements needed? No  If their condition worsens, is the pt aware to call PCP or go to the Emergency Dept.? Yes Was the patient provided with contact information for the PCP's office or ED? Yes Was to pt encouraged to call back with questions or concerns? Yes   Emmalise Huard [facility]  [facility], [facility] Guide Direct Dial: [phone]  Website: [email or web address]

## 2023-05-05 NOTE — Telephone Encounter (Signed)
I'm not sure if [facility] would have access to the MRI results, but ok to send referral to [facility] orth[facility]ics for cervical myelopathy

## 2023-05-05 NOTE — Telephone Encounter (Signed)
Transition Care Management Unsuccessful Follow-up Telephone Call  Date of discharge and from where:  04/18/2023 [facility]  Attempts:  1st Attempt  Reason for unsuccessful TCM follow-up call:  Left voice message  Pranish Akhavan [facility]  [facility], [facility] Guide Direct Dial: [phone]  Website: [email or web address]

## 2023-05-06 DIAGNOSIS — M5412 Radiculopathy, cervical region: Secondary | ICD-10-CM | POA: Insufficient documentation

## 2023-05-06 NOTE — Telephone Encounter (Signed)
I read Dr. Lindalou Hose report. I have referred her to Dr. Tressie Stalker for a second opinion

## 2023-05-07 ENCOUNTER — Other Ambulatory Visit: Payer: Self-pay | Admitting: Family Medicine

## 2023-05-07 DIAGNOSIS — F419 Anxiety disorder, unspecified: Secondary | ICD-10-CM

## 2023-05-22 DIAGNOSIS — M9901 Segmental and somatic dysfunction of cervical region: Secondary | ICD-10-CM | POA: Diagnosis not present

## 2023-05-22 DIAGNOSIS — M47892 Other spondylosis, cervical region: Secondary | ICD-10-CM | POA: Diagnosis not present

## 2023-05-22 DIAGNOSIS — M9902 Segmental and somatic dysfunction of thoracic region: Secondary | ICD-10-CM | POA: Diagnosis not present

## 2023-05-22 DIAGNOSIS — M5412 Radiculopathy, cervical region: Secondary | ICD-10-CM | POA: Diagnosis not present

## 2023-05-25 ENCOUNTER — Telehealth: Payer: BC Managed Care – PPO | Admitting: Family

## 2023-05-25 DIAGNOSIS — R399 Unspecified symptoms and signs involving the genitourinary system: Secondary | ICD-10-CM | POA: Diagnosis not present

## 2023-05-25 MED ORDER — CEPHALEXIN 500 MG PO CAPS: 500.0000 mg | ORAL_CAPSULE | Freq: Two times a day (BID) | ORAL | 0 refills | Status: DC

## 2023-05-25 NOTE — Progress Notes (Signed)
E-Visit for Urinary Problems  We are sorry that you are not feeling well.  Here is how we plan to help!  Based on what you shared with me it looks like you most likely have a simple urinary tract infection.  A UTI (Urinary Tract Infection) is a bacterial infection of the bladder.  Most cases of urinary tract infections are simple to treat but a key part of your care is to encourage you to drink plenty of fluids and watch your symptoms carefully.  I have prescribed Keflex 500 mg twice a day for 7 days.  Your symptoms should gradually improve. Call us if the burning in your urine worsens, you develop worsening fever, back pain or pelvic pain or if your symptoms do not resolve after completing the antibiotic.  Urinary tract infections can be prevented by drinking plenty of water to keep your body hydrated.  Also be sure when you wipe, wipe from front to back and don't hold it in!  If possible, empty your bladder every 4 hours.  HOME CARE Drink plenty of fluids Compete the full course of the antibiotics even if the symptoms resolve Remember, when you need to go.go. Holding in your urine can increase the likelihood of getting a UTI! GET HELP RIGHT AWAY IF: You cannot urinate You get a high fever Worsening back pain occurs You see blood in your urine You feel sick to your stomach or throw up You feel like you are going to pass out  MAKE SURE YOU  Understand these instructions. Will watch your condition. Will get help right away if you are not doing well or get worse.   Thank you for choosing an e-visit.  Your e-visit answers were reviewed by a board certified advanced clinical practitioner to complete your personal care plan. Depending upon the condition, your plan could have included both over the counter or prescription medications.  Please review your pharmacy choice. Make sure the pharmacy is open so you can pick up prescription now. If there is a problem, you may contact your  provider through MyChart messaging and have the prescription routed to another pharmacy.  Your safety is important to us. If you have drug allergies check your prescription carefully.   For the next 24 hours you can use MyChart to ask questions about today's visit, request a non-urgent call back, or ask for a work or school excuse. You will get an email in the next two days asking about your experience. I hope that your e-visit has been valuable and will speed your recovery.  Approximately 5 minutes was spent documenting and reviewing patient's chart.    

## 2023-05-26 DIAGNOSIS — Z6820 Body mass index (BMI) 20.0-20.9, adult: Secondary | ICD-10-CM | POA: Diagnosis not present

## 2023-05-26 DIAGNOSIS — R030 Elevated blood-pressure reading, without diagnosis of hypertension: Secondary | ICD-10-CM | POA: Diagnosis not present

## 2023-05-26 DIAGNOSIS — R635 Abnormal weight gain: Secondary | ICD-10-CM | POA: Diagnosis not present

## 2023-05-27 DIAGNOSIS — M9901 Segmental and somatic dysfunction of cervical region: Secondary | ICD-10-CM | POA: Diagnosis not present

## 2023-05-27 DIAGNOSIS — M9902 Segmental and somatic dysfunction of thoracic region: Secondary | ICD-10-CM | POA: Diagnosis not present

## 2023-05-28 ENCOUNTER — Ambulatory Visit: Payer: BC Managed Care – PPO | Admitting: Family Medicine

## 2023-05-28 ENCOUNTER — Encounter: Payer: Self-pay | Admitting: Family Medicine

## 2023-05-28 VITALS — BP 100/68 | HR 65 | Temp 98.2°F | Ht 59.0 in | Wt 113.4 lb

## 2023-05-28 DIAGNOSIS — K5900 Constipation, unspecified: Secondary | ICD-10-CM

## 2023-05-28 DIAGNOSIS — R350 Frequency of micturition: Secondary | ICD-10-CM | POA: Diagnosis not present

## 2023-05-28 LAB — POC URINALSYSI DIPSTICK (AUTOMATED)
Bilirubin, UA: NEGATIVE
Blood, UA: NEGATIVE
Glucose, UA: NEGATIVE
Ketones, UA: NEGATIVE
Leukocytes, UA: NEGATIVE
Nitrite, UA: NEGATIVE
Protein, UA: NEGATIVE
Spec Grav, UA: 1.015 (ref 1.010–1.025)
Urobilinogen, UA: 0.2 U/dL
pH, UA: 5 (ref 5.0–8.0)

## 2023-05-28 NOTE — Patient Instructions (Signed)
The results of your urinalysis were negative.  We will send in the sample to the lab for urine culture to see if it grows any bacteria that might require further antibiotics.  In the meantime increase your intake of plain water and fluids.  For continued or worsening symptoms follow-up with your PCP and urologist.

## 2023-05-28 NOTE — Progress Notes (Signed)
Established Patient Office Visit   Subjective  Patient ID: Kim Pacheco, female    DOB: 04/22/1986  Age: 37 y.o. MRN: [id number]  Chief Complaint  Patient presents with   Urinary Tract Infection    Started 4 days ago, burning, pressure, cramps, frequency     Patient is a 37 year old female followed by Dr. Casimiro Needle and seen for acute concern.  Patient endorses urinary frequency with little output, burning, cramping, pressure/discomfort in lower abdomen x 4 days.  Seen virtually, given Rx for Keflex 12/15.  Feels like symptoms have not improved.  Denies nausea, vomiting, fever, chills, low back pain.  Drinking 16-32 ounces of water per day.  Having some constipation.  Previously seen by urology for history of bladder rupture in 2021.    Patient Active Problem List   Diagnosis Date Noted   Cervical radiculopathy 05/06/2023   Atypical chest pain 03/14/2022   Neck strain, initial encounter 03/14/2022   Extraperitoneal bladder perforation 05/17/2020   History of recurrent UTI (urinary tract infection) 05/08/2020   Free intraperitoneal air 05/08/2020   Anxiety    Abnormal CT of the abdomen    Hepatic steatosis 05/07/2020   Pneumoperitoneum 05/07/2020   Foley catheter in place 05/07/2020   Anal fissure 05/07/2020   Change in bowel habit 05/07/2020   Periumbilical pain 05/07/2020   Rectal bleeding 05/07/2020   History of gestational diabetes 05/07/2020   History of depression 05/07/2020   Portal hypertension (HCC)    History of urinary retention 04/15/2020   Chronic abdominal pain 04/13/2020   Narcolepsy without cataplexy 02/02/2020   Depression, major, single episode, moderate (HCC) 10/28/2018   Elevated CA-125 08/06/2018   ARF (acute renal failure) (HCC) 06/25/2018   Alcohol abuse 06/24/2018   Constipation, chronic 06/24/2018   Ascites 06/24/2018   Body mass index (BMI) of 25.0 to 29.9 05/29/2018   Fibrocystic disease of breast 05/29/2018   Metrorrhagia 05/29/2018    Urinary tract infectious disease 05/29/2018   Oral hypoglycemic controlled White classification A2 gestational diabetes mellitus (GDM) 11/18/2017   Previous cesarean delivery, delivered: arrest of dilation  11/18/2017   Abnormal glucose tolerance test (GTT) during pregnancy, antepartum 09/17/2017   Chronic active viral hepatitis B (HCC) 06/14/2014   Past Medical History:  Diagnosis Date   Abdominal pain    started in mid abd and spread to left side/on and off/since Jan 2020/ pt states, she has fluid in abd/unknown   Abnormal Pap smear    normal 06/2013   Anxiety    Constipation    hx of   Depression    suicide attempt in college   Diabetes mellitus without complication (HCC)    gestational, no issues since pregnancy per her report - resolved   Gestational diabetes    /no meds now - resolved   Hepatitis    Hep B, managed by Dr. Kinnie Scales   History of chicken pox    History of suicide attempt 2003   Attempt in college   History of UTI    Vaginal Pap smear, abnormal    Past Surgical History:  Procedure Laterality Date   BIOPSY  05/10/2020   Procedure: BIOPSY;  Surgeon: Benancio Deeds, MD;  Location: [facility];  Service: Gastroenterology;;   BLADDER REPAIR N/A 05/17/2020   Procedure: EXPLORATORY LAPAROTOMY WITH CYSTORRHAPHY;  Surgeon: Marcine Matar, MD;  Location: [facility];  Service: Urology;  Laterality: N/A;   CESAREAN SECTION N/A 01/26/2013   Procedure: Primary Cesarean Section Delivery Baby  Boy @  0017, Apgars 4/8/9;  Surgeon: Freddrick March. Tenny Craw, MD;  Location: [facility];  Service: Obstetrics;  Laterality: N/A;   CESAREAN SECTION N/A 11/18/2017   Procedure: CESAREAN SECTION;  Surgeon: Olivia Mackie, MD;  Location: [facility];  Service: Obstetrics;  Laterality: N/A;   COLONOSCOPY  2020   DIAGNOSTIC LAPAROSCOPIC LIVER BIOPSY N/A 05/07/2020   Procedure: DIAGNOSTIC LAPAROSCOPIC;  Surgeon: Karie Soda, MD;  Location: [facility];  Service: General;  Laterality: N/A;    DIAGNOSTIC LAPAROSCOPY  08/25/2018   Dr Clifton James with Ob/Gyn3/2020   ESOPHAGOGASTRODUODENOSCOPY (EGD) WITH PROPOFOL N/A 05/10/2020   Procedure: ESOPHAGOGASTRODUODENOSCOPY (EGD) WITH PROPOFOL;  Surgeon: Benancio Deeds, MD;  Location: [facility];  Service: Gastroenterology;  Laterality: N/A;   ESOPHAGOGASTRODUODENOSCOPY ENDOSCOPY     IR TRANSCATHETER BX  04/18/2020   IR US GUIDE VASC ACCESS RIGHT  04/18/2020   IR VENOGRAM HEPATIC W HEMODYNAMIC EVALUATION  04/18/2020   NASAL SINUS SURGERY  1996   WISDOM TOOTH EXTRACTION     Social History   Tobacco Use   Smoking status: Former    Current packs/day: 0.00    Types: Cigarettes    Quit date: 07/23/2011    Years since quitting: 11.8   Smokeless tobacco: Never   Tobacco comments:    smoked for 8 years, quit in 2013  Vaping Use   Vaping status: Never Used  Substance Use Topics   Alcohol use: Yes    Alcohol/week: 3.0 standard drinks of alcohol    Types: 3 Glasses of wine per week    Comment: 3 glasses of wine per day   Drug use: Not Currently    Types: Marijuana    Comment: Last use 12/19/18   Family History  Adopted: Yes  Family history unknown: Yes   Allergies  Allergen Reactions   Contrast Media [Iodinated Contrast Media] Anaphylaxis, Shortness Of Breath and Rash    IV contrast    Nsaids Other (See Comments)    Hx gastritis & gastric perforation    Prednisone & Diphenhydramine Other (See Comments)    Don't like to take it   Prednisone     Other reaction(s): Other Don't like to take it      ROS Negative unless stated above    Objective:     BP 100/68 (BP Location: Left Arm, Patient Position: Sitting, Cuff Size: Normal)   Pulse 65   Temp 98.2 F (36.8 C) (Oral)   Ht 4\' 11"  (1.499 m)   Wt 113 lb 6.4 oz (51.4 kg)   LMP 05/27/2023   SpO2 98%   BMI 22.90 kg/m  BP Readings from Last 3 Encounters:  05/28/23 100/68  04/22/23 108/70  04/18/23 115/88   Wt Readings from Last 3 Encounters:  05/28/23 113 lb 6.4  oz (51.4 kg)  04/22/23 114 lb (51.7 kg)  04/18/23 116 lb 13.5 oz (53 kg)      Physical Exam Constitutional:      General: She is not in acute distress.    Appearance: Normal appearance.  HENT:     Head: Normocephalic and atraumatic.     Nose: Nose normal.     Mouth/Throat:     Mouth: Mucous membranes are moist.  Cardiovascular:     Rate and Rhythm: Normal rate and regular rhythm.     Heart sounds: Normal heart sounds. No murmur heard.    No gallop.  Pulmonary:     Effort: Pulmonary effort is normal. No respiratory distress.     Breath sounds:  Normal breath sounds. No wheezing, rhonchi or rales.  Abdominal:     General: Bowel sounds are normal.     Tenderness: There is abdominal tenderness in the right upper quadrant and left upper quadrant. There is no right CVA tenderness, left CVA tenderness or guarding.  Skin:    General: Skin is warm and dry.  Neurological:     Mental Status: She is alert and oriented to person, place, and time.     Results for orders placed or performed in visit on 05/28/23  POCT Urinalysis Dipstick (Automated)  Result Value Ref Range   Color, UA yellow    Clarity, UA clear    Glucose, UA Negative Negative   Bilirubin, UA neg    Ketones, UA neg    Spec Grav, UA 1.015 1.010 - 1.025   Blood, UA neg    pH, UA 5.0 5.0 - 8.0   Protein, UA Negative Negative   Urobilinogen, UA 0.2 0.2 or 1.0 E.U./dL   Nitrite, UA neg    Leukocytes, UA Negative Negative      Assessment & Plan:  Urinary frequency -     POCT Urinalysis Dipstick (Automated)  Constipation, unspecified constipation type  Patient with acute urinary symptoms, unrelieved by Keflex.  Patient given several bottles of water in clinic is unable to provide urine sample.  POC UA negative.  Obtain culture.  Additional medication if needed based on culture results.  Upon chart review prior urine cultures were negative in 2023, 2022, 2021.  Given history of bladder perf consider follow-up with  urology.  Return if symptoms worsen or fail to improve.   Deeann Saint, MD

## 2023-05-29 DIAGNOSIS — M9901 Segmental and somatic dysfunction of cervical region: Secondary | ICD-10-CM | POA: Diagnosis not present

## 2023-05-29 DIAGNOSIS — M9902 Segmental and somatic dysfunction of thoracic region: Secondary | ICD-10-CM | POA: Diagnosis not present

## 2023-05-29 LAB — URINE CULTURE
MICRO NUMBER:: 15866526
Result:: NO GROWTH
SPECIMEN QUALITY:: ADEQUATE

## 2023-06-02 DIAGNOSIS — M9902 Segmental and somatic dysfunction of thoracic region: Secondary | ICD-10-CM | POA: Diagnosis not present

## 2023-06-02 DIAGNOSIS — M9901 Segmental and somatic dysfunction of cervical region: Secondary | ICD-10-CM | POA: Diagnosis not present

## 2023-06-03 DIAGNOSIS — M9901 Segmental and somatic dysfunction of cervical region: Secondary | ICD-10-CM | POA: Diagnosis not present

## 2023-06-03 DIAGNOSIS — M9902 Segmental and somatic dysfunction of thoracic region: Secondary | ICD-10-CM | POA: Diagnosis not present

## 2023-06-06 DIAGNOSIS — R059 Cough, unspecified: Secondary | ICD-10-CM | POA: Diagnosis not present

## 2023-06-06 DIAGNOSIS — J069 Acute upper respiratory infection, unspecified: Secondary | ICD-10-CM | POA: Diagnosis not present

## 2023-06-09 DIAGNOSIS — M9901 Segmental and somatic dysfunction of cervical region: Secondary | ICD-10-CM | POA: Diagnosis not present

## 2023-06-09 DIAGNOSIS — M9902 Segmental and somatic dysfunction of thoracic region: Secondary | ICD-10-CM | POA: Diagnosis not present

## 2023-06-10 DIAGNOSIS — M9902 Segmental and somatic dysfunction of thoracic region: Secondary | ICD-10-CM | POA: Diagnosis not present

## 2023-06-10 DIAGNOSIS — M9901 Segmental and somatic dysfunction of cervical region: Secondary | ICD-10-CM | POA: Diagnosis not present

## 2023-06-13 ENCOUNTER — Ambulatory Visit: Payer: BC Managed Care – PPO | Admitting: Family

## 2023-06-13 ENCOUNTER — Telehealth: Payer: Self-pay | Admitting: Family Medicine

## 2023-06-13 VITALS — BP 122/85 | HR 110 | Temp 97.7°F | Resp 97 | Ht 59.0 in | Wt 113.6 lb

## 2023-06-13 DIAGNOSIS — J9801 Acute bronchospasm: Secondary | ICD-10-CM | POA: Diagnosis not present

## 2023-06-13 DIAGNOSIS — M778 Other enthesopathies, not elsewhere classified: Secondary | ICD-10-CM

## 2023-06-13 DIAGNOSIS — B349 Viral infection, unspecified: Secondary | ICD-10-CM

## 2023-06-13 MED ORDER — ALBUTEROL SULFATE HFA 108 (90 BASE) MCG/ACT IN AERS
2.0000 | INHALATION_SPRAY | Freq: Four times a day (QID) | RESPIRATORY_TRACT | 0 refills | Status: DC | PRN
Start: 2023-06-13 — End: 2023-07-28

## 2023-06-13 MED ORDER — METHYLPREDNISOLONE ACETATE 80 MG/ML IJ SUSP
80.0000 mg | Freq: Once | INTRAMUSCULAR | Status: AC
Start: 2023-06-13 — End: 2023-06-13
  Administered 2023-06-13: 80 mg via INTRAMUSCULAR

## 2023-06-13 NOTE — Telephone Encounter (Signed)
 Ok with me

## 2023-06-13 NOTE — Progress Notes (Signed)
 Patient ID: Kim Pacheco, female    DOB: 21-Feb-1986, 38 y.o.   MRN: [id number]  Chief Complaint  Patient presents with   Cough    Pt c/o of cough since Christmas Day- has right ear pressure, went to Urgent Care last Friday, and was diagnosed with Bronchitis, medication only helped with sleep, no better       Discussed the use of AI scribe software for clinical note transcription with the patient, who gave verbal consent to proceed.  History of Present Illness   The patient presents with a persistent cough and nasal congestion for about 10 days. She reports a runny and stuffy nose, and has been taking Mucinex and Afrin to alleviate symptoms. She also reports ear pain and a feeling of fullness in the right ear, which she attributes to a recent trip to the mountains. The patient has been coughing up yellowish mucus, but denies any history of asthma, but states she has used an inhaler in past for bronchial infections. She reports no shortness of breath or chest tightness, except when coughing. The cough is persistent throughout the day and night.  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     Assessment & Plan:     Upper Respiratory Infection - Persistent cough with yellow sputum, nasal congestion, and right ear discomfort. Mild wheezing noted on examination. Right tympanic membrane mildly injected, suggestive of resolving infection. -Administer Depo-Medrol  80mg  injection today for inflammation, advised on use & SE. -Prescribed Albuterol  inhaler, instructed to use 2 puffs 3 times daily for the next few days, then as needed. -Advise to try over-the-counter antihistamines (Zyrtec  or Claritin) daily for nasal drainage. -Advised on use of nasal saline spray tid to help w/disinfection & moisture. -Increase water  intake to 2L qd. -Advised to limit use of  Afrin to 3 days only per episode due to risk of rebound congestion. -RTO precautions provided.  Thumb Tendinitis - Pain and tenderness in right thumb, pain with palpation, likely due to overuse. -Advised on resting the joint, ice application for 15-20 minutes several times daily for 5-7 days. -Recommend over-the-counter thumb splint for use during work if unable to rest, to prevent further strain. -May consider PT or ortho eval if pain persists     Subjective:    Outpatient Medications Prior to Visit  Medication Sig Dispense Refill   FLUoxetine  (PROZAC ) 10 MG tablet TAKE 1 TABLET BY MOUTH DAILY     cephALEXin  (KEFLEX ) 500 MG capsule Take 1 capsule (500 mg total) by mouth 2 (two) times daily. 14 capsule 0   methocarbamol  (ROBAXIN ) 500 MG tablet Take 1 tablet (500 mg total) by mouth every 6 (six) hours as needed for muscle spasms. 60 tablet 0   No facility-administered medications prior to visit.   Past Medical History:  Diagnosis Date   Abdominal pain    started in mid abd and spread to left side/on and off/since Jan 2020/ pt states, she has fluid in abd/unknown   Abnormal Pap smear    normal 06/2013   Anxiety    Constipation    hx of   Depression    suicide attempt in college   Diabetes mellitus without complication (HCC)    gestational, no issues since pregnancy per her report - resolved   Gestational diabetes    /no meds now - resolved  Hepatitis    Hep B, managed by Dr. Luis   History of chicken pox    History of suicide attempt 2003   Attempt in college   History of UTI    Vaginal Pap smear, abnormal    Past Surgical History:  Procedure Laterality Date   BIOPSY  05/10/2020   Procedure: BIOPSY;  Surgeon: Leigh Elspeth SQUIBB, MD;  Location: [facility];  Service: Gastroenterology;;   BLADDER REPAIR N/A 05/17/2020   Procedure: EXPLORATORY LAPAROTOMY WITH CYSTORRHAPHY;  Surgeon: Matilda Senior, MD;  Location: [facility];  Service: Urology;  Laterality: N/A;    CESAREAN SECTION N/A 01/26/2013   Procedure: Primary Cesarean Section Delivery Baby  Boy @ 0017, Apgars 4/8/9;  Surgeon: Marjorie DEL. Okey, MD;  Location: [facility];  Service: Obstetrics;  Laterality: N/A;   CESAREAN SECTION N/A 11/18/2017   Procedure: CESAREAN SECTION;  Surgeon: Gorge Ade, MD;  Location: [facility];  Service: Obstetrics;  Laterality: N/A;   COLONOSCOPY  2020   DIAGNOSTIC LAPAROSCOPIC LIVER BIOPSY N/A 05/07/2020   Procedure: DIAGNOSTIC LAPAROSCOPIC;  Surgeon: Sheldon Elspeth, MD;  Location: [facility];  Service: General;  Laterality: N/A;   DIAGNOSTIC LAPAROSCOPY  08/25/2018   Dr Arlee with Ob/Gyn3/2020   ESOPHAGOGASTRODUODENOSCOPY (EGD) WITH PROPOFOL  N/A 05/10/2020   Procedure: ESOPHAGOGASTRODUODENOSCOPY (EGD) WITH PROPOFOL ;  Surgeon: Leigh Elspeth SQUIBB, MD;  Location: [facility];  Service: Gastroenterology;  Laterality: N/A;   ESOPHAGOGASTRODUODENOSCOPY ENDOSCOPY     IR TRANSCATHETER BX  04/18/2020   IR US  GUIDE VASC ACCESS RIGHT  04/18/2020   IR VENOGRAM HEPATIC W HEMODYNAMIC EVALUATION  04/18/2020   NASAL SINUS SURGERY  1996   WISDOM TOOTH EXTRACTION     Allergies  Allergen Reactions   Contrast Media [Iodinated Contrast Media] Anaphylaxis, Shortness Of Breath and Rash    IV contrast    Nsaids Other (See Comments)    Hx gastritis & gastric perforation    Prednisone  & Diphenhydramine  Other (See Comments)    Don't like to take it   Prednisone      Other reaction(s): Other Don't like to take it      Objective:    Physical Exam Vitals and nursing note reviewed.  Constitutional:      Appearance: Normal appearance. She is not ill-appearing.     Interventions: Face mask in place.  HENT:     Right Ear: Tympanic membrane and ear canal normal.     Left Ear: Tympanic membrane and ear canal normal.     Nose: Rhinorrhea present. Rhinorrhea is clear.     Right Sinus: No frontal sinus tenderness.     Left Sinus: No frontal sinus tenderness.     Mouth/Throat:      Mouth: Mucous membranes are moist.     Pharynx: No pharyngeal swelling, oropharyngeal exudate, posterior oropharyngeal erythema or uvula swelling.     Tonsils: No tonsillar exudate or tonsillar abscesses.  Cardiovascular:     Rate and Rhythm: Normal rate and regular rhythm.  Pulmonary:     Effort: Pulmonary effort is normal.     Breath sounds: Examination of the right-upper field reveals wheezing. Examination of the left-upper field reveals wheezing. Examination of the right-middle field reveals wheezing. Examination of the left-middle field reveals wheezing. Wheezing present.  Musculoskeletal:        General: Normal range of motion.     Right hand: Tenderness (w/palpation, medial distal thumb) present. No swelling. Normal range of motion. Normal strength. Normal sensation.       Arms:  Lymphadenopathy:     Head:     Right side of head: No preauricular or posterior auricular adenopathy.     Left side of head: No preauricular or posterior auricular adenopathy.     Cervical: No cervical adenopathy.  Skin:    General: Skin is warm and dry.  Neurological:     Mental Status: She is alert.  Psychiatric:        Mood and Affect: Mood normal.        Behavior: Behavior normal.    BP 122/85   Pulse (!) 110   Temp 97.7 F (36.5 C)   Resp (!) 97   Ht 4' 11 (1.499 m)   Wt 113 lb 9.6 oz (51.5 kg)   LMP 05/27/2023   BMI 22.94 kg/m  Wt Readings from Last 3 Encounters:  06/13/23 113 lb 9.6 oz (51.5 kg)  05/28/23 113 lb 6.4 oz (51.4 kg)  04/22/23 114 lb (51.7 kg)      Lucius Krabbe, NP

## 2023-06-13 NOTE — Telephone Encounter (Signed)
 Patient is requesting to transfer care from Dr. Casimiro Needle to [facility]. Is this okay with you?

## 2023-06-18 DIAGNOSIS — M5412 Radiculopathy, cervical region: Secondary | ICD-10-CM | POA: Diagnosis not present

## 2023-06-19 DIAGNOSIS — M9901 Segmental and somatic dysfunction of cervical region: Secondary | ICD-10-CM | POA: Diagnosis not present

## 2023-06-19 DIAGNOSIS — M9902 Segmental and somatic dysfunction of thoracic region: Secondary | ICD-10-CM | POA: Diagnosis not present

## 2023-06-19 DIAGNOSIS — M47892 Other spondylosis, cervical region: Secondary | ICD-10-CM | POA: Diagnosis not present

## 2023-06-19 DIAGNOSIS — M5412 Radiculopathy, cervical region: Secondary | ICD-10-CM | POA: Diagnosis not present

## 2023-06-23 DIAGNOSIS — M9902 Segmental and somatic dysfunction of thoracic region: Secondary | ICD-10-CM | POA: Diagnosis not present

## 2023-06-23 DIAGNOSIS — M5412 Radiculopathy, cervical region: Secondary | ICD-10-CM | POA: Diagnosis not present

## 2023-06-23 DIAGNOSIS — M47892 Other spondylosis, cervical region: Secondary | ICD-10-CM | POA: Diagnosis not present

## 2023-06-23 DIAGNOSIS — M9901 Segmental and somatic dysfunction of cervical region: Secondary | ICD-10-CM | POA: Diagnosis not present

## 2023-06-25 ENCOUNTER — Encounter: Payer: BC Managed Care – PPO | Admitting: Family

## 2023-06-25 DIAGNOSIS — M9901 Segmental and somatic dysfunction of cervical region: Secondary | ICD-10-CM | POA: Diagnosis not present

## 2023-06-25 DIAGNOSIS — M9902 Segmental and somatic dysfunction of thoracic region: Secondary | ICD-10-CM | POA: Diagnosis not present

## 2023-06-26 DIAGNOSIS — Z6820 Body mass index (BMI) 20.0-20.9, adult: Secondary | ICD-10-CM | POA: Diagnosis not present

## 2023-06-26 DIAGNOSIS — R030 Elevated blood-pressure reading, without diagnosis of hypertension: Secondary | ICD-10-CM | POA: Diagnosis not present

## 2023-06-26 DIAGNOSIS — R635 Abnormal weight gain: Secondary | ICD-10-CM | POA: Diagnosis not present

## 2023-06-26 DIAGNOSIS — M9901 Segmental and somatic dysfunction of cervical region: Secondary | ICD-10-CM | POA: Diagnosis not present

## 2023-06-26 DIAGNOSIS — M9902 Segmental and somatic dysfunction of thoracic region: Secondary | ICD-10-CM | POA: Diagnosis not present

## 2023-07-01 DIAGNOSIS — M9902 Segmental and somatic dysfunction of thoracic region: Secondary | ICD-10-CM | POA: Diagnosis not present

## 2023-07-01 DIAGNOSIS — M9901 Segmental and somatic dysfunction of cervical region: Secondary | ICD-10-CM | POA: Diagnosis not present

## 2023-07-03 DIAGNOSIS — M9901 Segmental and somatic dysfunction of cervical region: Secondary | ICD-10-CM | POA: Diagnosis not present

## 2023-07-03 DIAGNOSIS — M9902 Segmental and somatic dysfunction of thoracic region: Secondary | ICD-10-CM | POA: Diagnosis not present

## 2023-07-04 ENCOUNTER — Ambulatory Visit: Payer: BC Managed Care – PPO | Admitting: Family Medicine

## 2023-07-04 VITALS — BP 128/68 | HR 108 | Temp 98.2°F | Wt 115.1 lb

## 2023-07-04 DIAGNOSIS — R059 Cough, unspecified: Secondary | ICD-10-CM | POA: Diagnosis not present

## 2023-07-04 DIAGNOSIS — J101 Influenza due to other identified influenza virus with other respiratory manifestations: Secondary | ICD-10-CM

## 2023-07-04 LAB — POCT INFLUENZA A/B
Influenza A, POC: POSITIVE — AB
Influenza B, POC: NEGATIVE

## 2023-07-04 LAB — POC COVID19 BINAXNOW: SARS Coronavirus 2 Ag: NEGATIVE

## 2023-07-04 MED ORDER — OSELTAMIVIR PHOSPHATE 75 MG PO CAPS: 75.0000 mg | ORAL_CAPSULE | Freq: Two times a day (BID) | ORAL | 0 refills | Status: DC

## 2023-07-04 NOTE — Progress Notes (Unsigned)
Established Patient Office Visit  Subjective   Patient ID: Kim Pacheco, female    DOB: [birth year]  Age: 38 y.o. MRN: [id number]  Chief Complaint  Patient presents with   Nasal Congestion        Cough   Chills    HPI  {History (Optional):23778} Kim Pacheco is seen today with onset yesterday of nasal congestion, sore throat, chills, body aches, cough.  No documented fever but she does not have a thermometer.  No headaches.  Denies any nausea, vomiting, or diarrhea.  Her son was diagnosed with influenza on Monday.  She also states that her daughter's classroom had several people positive this week for influenza.  Past Medical History:  Diagnosis Date   Abdominal pain    started in mid abd and spread to left side/on and off/since Jan 2020/ pt states, she has fluid in abd/unknown   Abnormal Pap smear    normal 06/2013   Anxiety    Constipation    hx of   Depression    suicide attempt in college   Diabetes mellitus without complication (HCC)    gestational, no issues since pregnancy per her report - resolved   Gestational diabetes    /no meds now - resolved   Hepatitis    Hep B, managed by Dr. Kinnie Scales   History of chicken pox    History of suicide attempt 2003   Attempt in college   History of UTI    Vaginal Pap smear, abnormal    Past Surgical History:  Procedure Laterality Date   BIOPSY  05/10/2020   Procedure: BIOPSY;  Surgeon: Benancio Deeds, MD;  Location: [facility];  Service: Gastroenterology;;   BLADDER REPAIR N/A 05/17/2020   Procedure: EXPLORATORY LAPAROTOMY WITH CYSTORRHAPHY;  Surgeon: Marcine Matar, MD;  Location: [facility];  Service: Urology;  Laterality: N/A;   CESAREAN SECTION N/A 01/26/2013   Procedure: Primary Cesarean Section Delivery Baby  Boy @ 0017, Apgars 4/8/9;  Surgeon: Freddrick March. Tenny Craw, MD;  Location: [facility];  Service: Obstetrics;  Laterality: N/A;   CESAREAN SECTION N/A 11/18/2017   Procedure: CESAREAN SECTION;  Surgeon: Olivia Mackie, MD;  Location: [facility];  Service: Obstetrics;  Laterality: N/A;   COLONOSCOPY  2020   DIAGNOSTIC LAPAROSCOPIC LIVER BIOPSY N/A 05/07/2020   Procedure: DIAGNOSTIC LAPAROSCOPIC;  Surgeon: Karie Soda, MD;  Location: [facility];  Service: General;  Laterality: N/A;   DIAGNOSTIC LAPAROSCOPY  08/25/2018   Dr Clifton James with Ob/Gyn3/2020   ESOPHAGOGASTRODUODENOSCOPY (EGD) WITH PROPOFOL N/A 05/10/2020   Procedure: ESOPHAGOGASTRODUODENOSCOPY (EGD) WITH PROPOFOL;  Surgeon: Benancio Deeds, MD;  Location: [facility];  Service: Gastroenterology;  Laterality: N/A;   ESOPHAGOGASTRODUODENOSCOPY ENDOSCOPY     IR TRANSCATHETER BX  04/18/2020   IR US GUIDE VASC ACCESS RIGHT  04/18/2020   IR VENOGRAM HEPATIC W HEMODYNAMIC EVALUATION  04/18/2020   NASAL SINUS SURGERY  1996   WISDOM TOOTH EXTRACTION      reports that she quit smoking about 11 years ago. Her smoking use included cigarettes. She has never used smokeless tobacco. She reports current alcohol use of about 3.0 standard drinks of alcohol per week. She reports that she does not currently use drugs after having used the following drugs: Marijuana. She was adopted. Family history is unknown by patient. Allergies  Allergen Reactions   Contrast Media [Iodinated Contrast Media] Anaphylaxis, Shortness Of Breath and Rash    IV contrast    Nsaids Other (See Comments)    Hx  gastritis & gastric perforation    Prednisone & Diphenhydramine Other (See Comments)    Don't like to take it   Prednisone     Other reaction(s): Other Don't like to take it    Review of Systems  Constitutional:  Positive for chills.  HENT:  Positive for congestion and sore throat.   Respiratory:  Positive for cough. Negative for shortness of breath.       Objective:     BP 128/68 (BP Location: Left Arm, Patient Position: Sitting, Cuff Size: Normal)   Pulse (!) 108   Temp 98.2 F (36.8 C) (Oral)   Wt 115 lb 1.6 oz (52.2 kg)   SpO2 99%   BMI 23.25 kg/m   BP Readings from Last 3 Encounters:  07/04/23 128/68  06/13/23 122/85  05/28/23 100/68   Wt Readings from Last 3 Encounters:  07/04/23 115 lb 1.6 oz (52.2 kg)  06/13/23 113 lb 9.6 oz (51.5 kg)  05/28/23 113 lb 6.4 oz (51.4 kg)      Physical Exam Vitals reviewed.  Constitutional:      General: She is not in acute distress.    Appearance: She is not toxic-appearing.  HENT:     Right Ear: Tympanic membrane normal.     Left Ear: Tympanic membrane normal.     Mouth/Throat:     Mouth: Mucous membranes are moist.     Pharynx: Oropharynx is clear. No oropharyngeal exudate or posterior oropharyngeal erythema.  Cardiovascular:     Rate and Rhythm: Normal rate and regular rhythm.  Pulmonary:     Effort: Pulmonary effort is normal.     Breath sounds: Normal breath sounds. No wheezing or rales.  Musculoskeletal:     Cervical back: Neck supple.  Lymphadenopathy:     Cervical: No cervical adenopathy.  Neurological:     Mental Status: She is alert.      No results found for any visits on 07/04/23.  {Labs (Optional):23779}  The ASCVD Risk score (Arnett DK, et al., 2019) failed to calculate for the following reasons:   The 2019 ASCVD risk score is only valid for ages 44 to 62    Assessment & Plan:   Probable viral syndrome.  Recent exposure to influenza.  Test for influenza  Kim Peat, MD

## 2023-07-07 ENCOUNTER — Encounter: Payer: Self-pay | Admitting: Family Medicine

## 2023-07-07 DIAGNOSIS — M9902 Segmental and somatic dysfunction of thoracic region: Secondary | ICD-10-CM | POA: Diagnosis not present

## 2023-07-07 DIAGNOSIS — M9901 Segmental and somatic dysfunction of cervical region: Secondary | ICD-10-CM | POA: Diagnosis not present

## 2023-07-07 MED ORDER — BENZONATATE 100 MG PO CAPS: ORAL_CAPSULE | ORAL | 0 refills | Status: DC

## 2023-07-14 DIAGNOSIS — M9901 Segmental and somatic dysfunction of cervical region: Secondary | ICD-10-CM | POA: Diagnosis not present

## 2023-07-14 DIAGNOSIS — M9902 Segmental and somatic dysfunction of thoracic region: Secondary | ICD-10-CM | POA: Diagnosis not present

## 2023-07-17 DIAGNOSIS — M9901 Segmental and somatic dysfunction of cervical region: Secondary | ICD-10-CM | POA: Diagnosis not present

## 2023-07-17 DIAGNOSIS — M546 Pain in thoracic spine: Secondary | ICD-10-CM | POA: Diagnosis not present

## 2023-07-24 DIAGNOSIS — M9901 Segmental and somatic dysfunction of cervical region: Secondary | ICD-10-CM | POA: Diagnosis not present

## 2023-07-24 DIAGNOSIS — M9902 Segmental and somatic dysfunction of thoracic region: Secondary | ICD-10-CM | POA: Diagnosis not present

## 2023-07-28 ENCOUNTER — Ambulatory Visit: Payer: BC Managed Care – PPO | Admitting: Family

## 2023-07-28 ENCOUNTER — Ambulatory Visit: Payer: BC Managed Care – PPO

## 2023-07-28 ENCOUNTER — Encounter: Payer: Self-pay | Admitting: Family

## 2023-07-28 VITALS — BP 136/85 | HR 72 | Temp 97.5°F | Ht 59.0 in | Wt 113.4 lb

## 2023-07-28 DIAGNOSIS — B349 Viral infection, unspecified: Secondary | ICD-10-CM

## 2023-07-28 DIAGNOSIS — J9801 Acute bronchospasm: Secondary | ICD-10-CM | POA: Diagnosis not present

## 2023-07-28 DIAGNOSIS — R059 Cough, unspecified: Secondary | ICD-10-CM | POA: Diagnosis not present

## 2023-07-28 DIAGNOSIS — R053 Chronic cough: Secondary | ICD-10-CM

## 2023-07-28 MED ORDER — ALBUTEROL SULFATE HFA 108 (90 BASE) MCG/ACT IN AERS
2.0000 | INHALATION_SPRAY | Freq: Four times a day (QID) | RESPIRATORY_TRACT | 2 refills | Status: DC | PRN
Start: 2023-07-28 — End: 2023-11-04

## 2023-07-28 NOTE — Patient Instructions (Signed)
It was very nice to see you today!   I have sent a refill of the Albuterol inhaler. Use this 3 times per day until cough is better. I will let you know the results of the chest xray as soon as it has been read. Continue to drink plenty of water, at least 2 liters daily.      PLEASE NOTE:  If you had any lab tests please let us know if you have not heard back within a few days. You may see your results on MyChart before we have a chance to review them but we will give you a call once they are reviewed by [place]. If we ordered any referrals today, please let us know if you have not heard from their office within the next week.

## 2023-07-28 NOTE — Progress Notes (Signed)
Patient ID: Kim Pacheco, female    DOB: [birth year], 38 y.o.   MRN: [id number]  Chief Complaint  Patient presents with   Transfer of care   Cough    Pt c/o cough since December. Pt was diagnosed with bronchitis, Has tried tessalon Perles PRN which does help slightly.        Discussed the use of AI scribe software for clinical note transcription with the patient, who gave verbal consent to proceed.  History of Present Illness   Kim Pacheco is a 38 year old female who presents with a persistent cough and chest tightness. She has been experiencing a persistent cough since a couple of days after Christmas, initially diagnosed as bronchitis at an urgent care center. The cough sometimes produces yellowish phlegm, although not consistently, and it wakes her up at night. She experiences chest tightness and a raspy feeling when breathing, with occasional wheezing, particularly noticeable yesterday. There is no history of asthma, but she was prescribed an inhaler a couple of years ago for chest tightness related to exercise-induced bronchospasm. She was prescribed tessalon pearles and an inhaler by her previous primary care provider, which have provided some relief but not resolved the issue. She prefers to avoid prednisone due to its side effects of increased hunger, which conflicts with her weight loss efforts. She has not moved recently or acquired new pets, and she has had a cat for about two years without previous issues. No runny nose, sneezing, reflux, throat clearing, or difficulty swallowing. No fever or shortness of breath.     Assessment & Plan:     Chronic Cough - Persistent cough with occasional yellowish phlegm, previously diagnosed as bronchitis. No fever, dyspnea, or chest pain. No history of asthma. No significant environmental changes or new pets. No symptoms of reflux or dysphagia. No wheezing on examination today, but patient reports occasional wheezing at  home. -Order chest x-ray to rule out any underlying lung pathology. -Continue Albuterol inhaler as needed, specifically in the morning and an hour before bedtime. -Increase water intake to 2 liters per day. -Consider starting a steroid inhaler depending on x-ray results. -Refill Albuterol prescription.      Subjective:    Outpatient Medications Prior to Visit  Medication Sig Dispense Refill   albuterol (VENTOLIN HFA) 108 (90 Base) MCG/ACT inhaler Inhale 2 puffs into the lungs every 6 (six) hours as needed for wheezing or shortness of breath. 8 g 0   benzonatate (TESSALON) 100 MG capsule Take 1 capsule every 8 hours as needed cough 30 capsule 0   PHENTERMINE HCL PO Take by mouth. Pt is unsure of dose, Prescribed by [facility]([address])     topiramate (TOPAMAX) 25 MG tablet Take 25 mg by mouth daily.     FLUoxetine (PROZAC) 10 MG tablet TAKE 1 TABLET BY MOUTH DAILY (Patient not taking: Reported on 07/28/2023)     oseltamivir (TAMIFLU) 75 MG capsule Take 1 capsule (75 mg total) by mouth 2 (two) times daily. (Patient not taking: Reported on 07/28/2023) 10 capsule 0   No facility-administered medications prior to visit.   Past Medical History:  Diagnosis Date   Abdominal pain    started in mid abd and spread to left side/on and off/since Jan 2020/ pt states, she has fluid in abd/unknown   Abnormal Pap smear    normal 06/2013   Anxiety    Constipation    hx of   Depression    suicide attempt in  college   Diabetes mellitus without complication (HCC)    gestational, no issues since pregnancy per her report - resolved   Gestational diabetes    /no meds now - resolved   Hepatitis    Hep B, managed by Dr. Kinnie Scales   History of chicken pox    History of suicide attempt 2003   Attempt in college   History of UTI    Vaginal Pap smear, abnormal    Past Surgical History:  Procedure Laterality Date   BIOPSY  05/10/2020   Procedure: BIOPSY;  Surgeon: Benancio Deeds,  MD;  Location: [facility];  Service: Gastroenterology;;   BLADDER REPAIR N/A 05/17/2020   Procedure: EXPLORATORY LAPAROTOMY WITH CYSTORRHAPHY;  Surgeon: Marcine Matar, MD;  Location: [facility];  Service: Urology;  Laterality: N/A;   CESAREAN SECTION N/A 01/26/2013   Procedure: Primary Cesarean Section Delivery Baby  Boy @ 0017, Apgars 4/8/9;  Surgeon: Freddrick March. Tenny Craw, MD;  Location: [facility];  Service: Obstetrics;  Laterality: N/A;   CESAREAN SECTION N/A 11/18/2017   Procedure: CESAREAN SECTION;  Surgeon: Olivia Mackie, MD;  Location: [facility];  Service: Obstetrics;  Laterality: N/A;   COLONOSCOPY  2020   DIAGNOSTIC LAPAROSCOPIC LIVER BIOPSY N/A 05/07/2020   Procedure: DIAGNOSTIC LAPAROSCOPIC;  Surgeon: Karie Soda, MD;  Location: [facility];  Service: General;  Laterality: N/A;   DIAGNOSTIC LAPAROSCOPY  08/25/2018   Dr Clifton James with Ob/Gyn3/2020   ESOPHAGOGASTRODUODENOSCOPY (EGD) WITH PROPOFOL N/A 05/10/2020   Procedure: ESOPHAGOGASTRODUODENOSCOPY (EGD) WITH PROPOFOL;  Surgeon: Benancio Deeds, MD;  Location: [facility];  Service: Gastroenterology;  Laterality: N/A;   ESOPHAGOGASTRODUODENOSCOPY ENDOSCOPY     IR TRANSCATHETER BX  04/18/2020   IR US GUIDE VASC ACCESS RIGHT  04/18/2020   IR VENOGRAM HEPATIC W HEMODYNAMIC EVALUATION  04/18/2020   NASAL SINUS SURGERY  1996   WISDOM TOOTH EXTRACTION     Allergies  Allergen Reactions   Contrast Media [Iodinated Contrast Media] Anaphylaxis, Shortness Of Breath and Rash    IV contrast    Nsaids Other (See Comments)    Hx gastritis & gastric perforation    Prednisone & Diphenhydramine Other (See Comments)    Don't like to take it   Prednisone     Other reaction(s): Other Don't like to take it      Objective:    Physical Exam Vitals and nursing note reviewed.  Constitutional:      Appearance: Normal appearance.  Cardiovascular:     Rate and Rhythm: Normal rate and regular rhythm.  Pulmonary:     Effort: Pulmonary effort is  normal.     Breath sounds: Normal breath sounds.  Musculoskeletal:        General: Normal range of motion.  Skin:    General: Skin is warm and dry.  Neurological:     Mental Status: She is alert.  Psychiatric:        Mood and Affect: Mood normal.        Behavior: Behavior normal.    BP 136/85 (BP Location: Left Arm, Patient Position: Sitting, Cuff Size: Small)   Pulse 72   Temp (!) 97.5 F (36.4 C) (Temporal)   Ht 4\' 11"  (1.499 m)   Wt 113 lb 6.4 oz (51.4 kg)   LMP 07/23/2023 (Exact Date)   SpO2 99%   BMI 22.90 kg/m  Wt Readings from Last 3 Encounters:  07/28/23 113 lb 6.4 oz (51.4 kg)  07/04/23 115 lb 1.6 oz (52.2 kg)  06/13/23 113  lb 9.6 oz (51.5 kg)      Dulce Sellar, NP

## 2023-08-10 ENCOUNTER — Encounter: Payer: Self-pay | Admitting: Family

## 2023-08-14 ENCOUNTER — Encounter: Payer: Self-pay | Admitting: Family Medicine

## 2023-08-14 ENCOUNTER — Ambulatory Visit (INDEPENDENT_AMBULATORY_CARE_PROVIDER_SITE_OTHER): Admitting: Family Medicine

## 2023-08-14 ENCOUNTER — Ambulatory Visit: Admitting: Family Medicine

## 2023-08-14 ENCOUNTER — Ambulatory Visit: Payer: Self-pay | Admitting: Family Medicine

## 2023-08-14 VITALS — BP 104/74 | HR 82 | Temp 98.2°F | Ht 59.0 in | Wt 114.5 lb

## 2023-08-14 DIAGNOSIS — M26609 Unspecified temporomandibular joint disorder, unspecified side: Secondary | ICD-10-CM | POA: Diagnosis not present

## 2023-08-14 MED ORDER — CYCLOBENZAPRINE HCL 10 MG PO TABS: 5.0000 mg | ORAL_TABLET | Freq: Three times a day (TID) | ORAL | 0 refills | Status: DC | PRN

## 2023-08-14 MED ORDER — DICLOFENAC SODIUM 1 % EX GEL: 4.0000 g | Freq: Four times a day (QID) | CUTANEOUS | 1 refills | Status: DC | PRN

## 2023-08-14 NOTE — Telephone Encounter (Addendum)
 Chief Complaint: Jaw pain Symptoms: Neck pain, ear pain,  Frequency: constant Pertinent Negatives: Patient denies fever, tinnitus, swelling, pain radiation, stiff neck  Disposition: [] ED /[] Urgent Care (no appt availability in office) / [x] Appointment(In office/virtual)/ []  [facility]/ [] Home Care/ [] Refused Recommended Disposition /[] [facility]/ []  Follow-up with PCP Additional Notes: Patient called with complaints of worsening jaw pain that started Last Monday. Patient states she started having pain in her teeth on her right side that was unexplained and went to see her dentist last week who did an x-ray and 3D scan that showed pulled muscle. Patient was directed to f/u with PCP and states that the Jaw pain has now spread to her right ear and right side of neck. Pain is constant and 7/10, temporarily helped with tylenol (takes pain to 3/10) but pain does not every completely go away. Patient also using heat and ice. Pain is worse with palpation, patient is unable to eat food on right side, but denies dysphagia, tinnitus, decreased hearing, fever, swelling, neck stiffness, numbness/weakness. Patient advised by this RN to be seen within the next 3 days to which patient was agreeable. Appt scheduled. Patient advised by this RN to call back with worsening symptoms. Patient verbalized understanding.    Copied from CRM [id number]. Topic: Clinical - Red Word Triage >> Aug 14, 2023  9:56 AM Kim Pacheco wrote: Red Word that prompted transfer to Nurse Triage: Pain in jaw/ear - states she seen her dentist already who states she may have pulled Pacheco muscle.  Pain level 7/10 Reason for Disposition  [1] MODERATE neck pain (e.g., interferes with normal activities) AND [2] present > 3 days  Answer Assessment - Initial Assessment Questions 1. ONSET: "When did the pain begin?"      Last Monday 2. LOCATION: "Where does it hurt?"      Right side 3. PATTERN "Does the pain come and go, or has it  been constant since it started?"      Constant 4. SEVERITY: "How bad is the pain?"  (Scale 1-10; or mild, moderate, severe)   - NO PAIN (0): no pain or only slight stiffness    - MILD (1-3): doesn't interfere with normal activities    - MODERATE (4-7): interferes with normal activities or awakens from sleep    - SEVERE (8-10):  excruciating pain, unable to do any normal activities      Moderate 5. RADIATION: "Does the pain go anywhere else, shoot into your arms?"     Denies 6. CORD SYMPTOMS: "Any weakness or numbness of the arms or legs?"     Denies 7. CAUSE: "What do you think is causing the neck pain?"    Pulled muscle in jaw 8. NECK OVERUSE: "Any recent activities that involved turning or twisting the neck?"     Denies 9. OTHER SYMPTOMS: "Do you have any other symptoms?" (e.g., headache, fever, chest pain, difficulty breathing, neck swelling)     Right ear pain, right sided neck pain 10. PREGNANCY: "Is there any chance you are pregnant?" "When was your last menstrual period?"       Denies  Tylenol - improves pain temporarily to 3/10  Protocols used: Neck Pain or Stiffness-Pacheco-AH  Reason for Disposition  [1] MODERATE neck pain (e.g., interferes with normal activities) AND [2] present > 3 days  Answer Assessment - Initial Assessment Questions 1. ONSET: "When did the pain begin?"      Last Monday 2. LOCATION: "Where does it hurt?"  Right side 3. PATTERN "Does the pain come and go, or has it been constant since it started?"      Constant 4. SEVERITY: "How bad is the pain?"  (Scale 1-10; or mild, moderate, severe)   - NO PAIN (0): no pain or only slight stiffness    - MILD (1-3): doesn't interfere with normal activities    - MODERATE (4-7): interferes with normal activities or awakens from sleep    - SEVERE (8-10):  excruciating pain, unable to do any normal activities      Moderate 5. RADIATION: "Does the pain go anywhere else, shoot into your arms?"     Denies 6. CORD  SYMPTOMS: "Any weakness or numbness of the arms or legs?"     Denies 7. CAUSE: "What do you think is causing the neck pain?"     Jaw , 8. NECK OVERUSE: "Any recent activities that involved turning or twisting the neck?"     Denies 9. OTHER SYMPTOMS: "Do you have any other symptoms?" (e.g., headache, fever, chest pain, difficulty breathing, neck swelling)     Right ear pain, right sided 10. PREGNANCY: "Is there any chance you are pregnant?" "When was your last menstrual period?"       Denies  Tylenol - 3  Answer Assessment - Initial Assessment Questions 1. ONSET: "When did the mouth start hurting?" (e.g., hours or days ago)      Last Monday 2. SEVERITY: "How bad is the pain?" (Scale 1-10; mild, moderate or severe)   - MILD (1-3):  doesn't interfere with eating or normal activities   - MODERATE (4-7): interferes with eating some solids and normal activities   - SEVERE (8-10):  excruciating pain, interferes with most normal activities   - SEVERE DYSPHAGIA: can't swallow liquids, drooling     Moderate 3. SORES: "Are there any sores or ulcers in the mouth?" If Yes, ask: "What part of the mouth are the sores in?"     Denies 4. FEVER: "Do you have Pacheco fever?" If Yes, ask: "What is your temperature, how was it measured, and when did it start?"     Denies 5. CAUSE: "What do you think is causing the mouth pain?"     "I pulled Pacheco muscle in my jaw" 6. OTHER SYMPTOMS: "Do you have any other symptoms?" (e.g., difficulty breathing)     Right neck pain (no swelling and worse with palpation), right ear pain  Protocols used: Neck Pain or Stiffness-Pacheco-AH, Mouth Pain-Pacheco-AH  Reason for Disposition  [1] MODERATE neck pain (e.g., interferes with normal activities) AND [2] present > 3 days  Answer Assessment - Initial Assessment Questions 1. ONSET: "When did the pain begin?"      Last Monday 2. LOCATION: "Where does it hurt?"      Right side 3. PATTERN "Does the pain come and go, or has it been constant  since it started?"      Constant 4. SEVERITY: "How bad is the pain?"  (Scale 1-10; or mild, moderate, severe)   - NO PAIN (0): no pain or only slight stiffness    - MILD (1-3): doesn't interfere with normal activities    - MODERATE (4-7): interferes with normal activities or awakens from sleep    - SEVERE (8-10):  excruciating pain, unable to do any normal activities      Moderate 5. RADIATION: "Does the pain go anywhere else, shoot into your arms?"     Denies 6. CORD SYMPTOMS: "Any weakness or numbness  of the arms or legs?"     Denies 7. CAUSE: "What do you think is causing the neck pain?"     Jaw , 8. NECK OVERUSE: "Any recent activities that involved turning or twisting the neck?"     Denies 9. OTHER SYMPTOMS: "Do you have any other symptoms?" (e.g., headache, fever, chest pain, difficulty breathing, neck swelling)     Right ear pain, right sided 10. PREGNANCY: "Is there any chance you are pregnant?" "When was your last menstrual period?"       Denies  Tylenol - 3  Protocols used: Neck Pain or Stiffness-Pacheco-AH

## 2023-08-14 NOTE — Telephone Encounter (Signed)
Appt today at 11:30am

## 2023-08-14 NOTE — Progress Notes (Signed)
   Acute Office Visit  Subjective:     Patient ID: Kim Pacheco, female    DOB: 22-May-1986, 38 y.o.   MRN: [id number]  Chief Complaint  Patient presents with   Jaw Pain    Patient complains of right jaw pain x10 days, radiating to the right ear and head, seen by dentist last week and was told problem was muscular    Pt is here for an acute problem. She reports that her jaw has been hurting for the last 10 days on the right side, states that the pain is radiating into her ear. States that it has not gotten better in the last 10 days. Pt reports no other associated symptom, no fever or chills, no hearing difficulty, no sore throat or nasal congestion. She went to the dentist and he told her it was most likely MSK.      Review of Systems  All other systems reviewed and are negative.       Objective:    BP 104/74   Pulse 82   Temp 98.2 F (36.8 C) (Oral)   Ht 4\' 11"  (1.499 m)   Wt 114 lb 8 oz (51.9 kg)   LMP 07/23/2023 (Exact Date)   SpO2 98%   BMI 23.13 kg/m    Physical Exam Constitutional:      Appearance: Normal appearance. She is normal weight.  HENT:     Right Ear: Tympanic membrane and ear canal normal.     Left Ear: Tympanic membrane and ear canal normal.     Mouth/Throat:     Mouth: Mucous membranes are moist.     Pharynx: No posterior oropharyngeal erythema.     Comments: Tenderness to palpation of the right TMJ and the muscles of mastication Eyes:     Conjunctiva/sclera: Conjunctivae normal.  Neurological:     Mental Status: She is alert.     No results found for any visits on 08/14/23.      Assessment & Plan:   Problem List Items Addressed This Visit   None Visit Diagnoses       TMJ (temporomandibular joint disorder)    -  Primary   Relevant Medications   diclofenac Sodium (VOLTAREN) 1 % GEL   cyclobenzaprine (FLEXERIL) 10 MG tablet     Pt cannot take oral NSAIDS, will rx diclofenac gel to be applied and also PRN muscle relaxers.  Pt is being fitted for a mouth guard at night by her dentist next week. RTC PRN.   Meds ordered this encounter  Medications   diclofenac Sodium (VOLTAREN) 1 % GEL    Sig: Apply 4 g topically 4 (four) times daily as needed.    Dispense:  100 g    Refill:  1   cyclobenzaprine (FLEXERIL) 10 MG tablet    Sig: Take 0.5-1 tablets (5-10 mg total) by mouth 3 (three) times daily as needed for muscle spasms.    Dispense:  30 tablet    Refill:  0    No follow-ups on file.  Karie Georges, MD

## 2023-08-16 ENCOUNTER — Encounter: Payer: Self-pay | Admitting: Family Medicine

## 2023-08-17 ENCOUNTER — Encounter: Payer: Self-pay | Admitting: Family

## 2023-08-17 DIAGNOSIS — M26609 Unspecified temporomandibular joint disorder, unspecified side: Secondary | ICD-10-CM

## 2023-08-18 NOTE — Telephone Encounter (Signed)
 Let's try sending her to [facility]-- they might be able to place an injection to relieve the pain.

## 2023-08-20 NOTE — Telephone Encounter (Signed)
Ok to cancel the referral

## 2023-08-28 DIAGNOSIS — R635 Abnormal weight gain: Secondary | ICD-10-CM | POA: Diagnosis not present

## 2023-09-12 ENCOUNTER — Telehealth: Admitting: Family Medicine

## 2023-09-12 ENCOUNTER — Encounter: Payer: Self-pay | Admitting: Family

## 2023-09-12 DIAGNOSIS — N390 Urinary tract infection, site not specified: Secondary | ICD-10-CM

## 2023-09-12 MED ORDER — SULFAMETHOXAZOLE-TRIMETHOPRIM 800-160 MG PO TABS: 1.0000 | ORAL_TABLET | Freq: Two times a day (BID) | ORAL | 0 refills | Status: DC

## 2023-09-12 NOTE — Progress Notes (Signed)
E-Visit for Urinary Problems  We are sorry that you are not feeling well.  Here is how we plan to help!  Based on what you shared with me it looks like you most likely have a simple urinary tract infection.  A UTI (Urinary Tract Infection) is a bacterial infection of the bladder.  Most cases of urinary tract infections are simple to treat but a key part of your care is to encourage you to drink plenty of fluids and watch your symptoms carefully.  I have prescribed Bactrim DS One tablet twice a day for 5 days.  Your symptoms should gradually improve. Call us if the burning in your urine worsens, you develop worsening fever, back pain or pelvic pain or if your symptoms do not resolve after completing the antibiotic.  Urinary tract infections can be prevented by drinking plenty of water to keep your body hydrated.  Also be sure when you wipe, wipe from front to back and don't hold it in!  If possible, empty your bladder every 4 hours.  HOME CARE Drink plenty of fluids Compete the full course of the antibiotics even if the symptoms resolve Remember, when you need to go.go. Holding in your urine can increase the likelihood of getting a UTI! GET HELP RIGHT AWAY IF: You cannot urinate You get a  fever Worsening back pain occurs You see blood in your urine You feel sick to your stomach or throw up You feel like you are going to pass out  MAKE SURE YOU  Understand these instructions. Will watch your condition. Will get help right away if you are not doing well or get worse.   Thank you for choosing an e-visit.  Your e-visit answers were reviewed by a board certified advanced clinical practitioner to complete your personal care plan. Depending upon the condition, your plan could have included both over the counter or prescription medications.  Please review your pharmacy choice. Make sure the pharmacy is open so you can pick up prescription now. If there is a problem, you may contact  your provider through MyChart messaging and have the prescription routed to another pharmacy.  Your safety is important to us. If you have drug allergies check your prescription carefully.   For the next 24 hours you can use MyChart to ask questions about today's visit, request a non-urgent call back, or ask for a work or school excuse. You will get an email in the next two days asking about your experience. I hope that your e-visit has been valuable and will speed your recovery.   have provided 5 minutes of non face to face time during this encounter for chart review and documentation.    

## 2023-09-16 ENCOUNTER — Encounter: Payer: Self-pay | Admitting: Physician Assistant

## 2023-09-16 ENCOUNTER — Ambulatory Visit: Admitting: Physician Assistant

## 2023-09-16 VITALS — BP 110/64 | HR 78 | Temp 98.3°F | Ht 59.0 in | Wt 116.6 lb

## 2023-09-16 DIAGNOSIS — R300 Dysuria: Secondary | ICD-10-CM

## 2023-09-16 DIAGNOSIS — N390 Urinary tract infection, site not specified: Secondary | ICD-10-CM | POA: Diagnosis not present

## 2023-09-16 LAB — POC URINALSYSI DIPSTICK (AUTOMATED)
Bilirubin, UA: NEGATIVE
Glucose, UA: NEGATIVE
Ketones, UA: NEGATIVE
Nitrite, UA: POSITIVE
Protein, UA: NEGATIVE
Spec Grav, UA: 1.025 (ref 1.010–1.025)
Urobilinogen, UA: 0.2 U/dL
pH, UA: 5.5 (ref 5.0–8.0)

## 2023-09-16 MED ORDER — CEPHALEXIN 500 MG PO CAPS
ORAL_CAPSULE | ORAL | 2 refills | Status: DC
Start: 2023-09-16 — End: 2024-02-26

## 2023-09-16 NOTE — Progress Notes (Signed)
 Patient ID: Kim Pacheco, female    DOB: 06/09/1986, 38 y.o.   MRN: [id number]   Assessment & Plan:  Dysuria -     POCT Urinalysis Dipstick (Automated) -     Urine Culture  Recurrent UTI -     Cephalexin; Take 1 capsule po after intercourse to prevent UTI.  Dispense: 30 capsule; Refill: 2      Assessment and Plan Assessment & Plan Urinary Tract Infection (UTI) Recurrent UTIs with current symptoms of pelvic discomfort, pressure, and hematuria. Urinalysis indicates infection with nitrates and cloudiness. No dysuria or frequency reported. Recent use of expired cephalexin provided temporary relief. Differential diagnosis includes yeast infection, but symptoms and urinalysis suggest UTI. - Take bactrim (previously prescribed via e-visit) for current infection. - Send urine for culture to confirm infection and guide treatment. - Prescribe cephalexin 500 mg for prophylactic use post-intercourse to prevent recurrent UTIs. - Advise to monitor for fever, chills, worsening pain, or symptoms indicating progression to pyelonephritis. - Ensure adequate hydration. - Discuss potential for yeast infections post-antibiotic use and advise to report symptoms for treatment.      No follow-ups on file.    Subjective:    Chief Complaint  Patient presents with   Urinary Frequency    Pt stated feeling pressure started over the weekend she had some left over abx at home she started but run out. She has had a lot of uti in the past.   Vaginitis    Pt states think she has yeast infection as well.    Urinary Frequency  Associated symptoms include frequency.   Discussed the use of AI scribe software for clinical note transcription with the patient, who gave verbal consent to proceed.  History of Present Illness Kim Pacheco is a 38 year old female with recurrent UTIs who presents with urinary discomfort and pressure.  She experiences urinary discomfort and a sensation  of pressure around the vaginal and anal areas. There is no frequent urination or intense burning, but she notes hematuria and cloudy urine. No fever, chills, or significant back pain are present, though she mentions mild back pain.  She has a history of recurrent UTIs, particularly during periods of increased sexual activity. Previously, she was prescribed cephalexin post-intercourse as a preventive measure, which was effective. Recently, she took leftover cephalexin for a dental infection, which alleviated some symptoms over the weekend.  She has experienced a combination of UTI and yeast infections in the past, for which she was prescribed a cream and another unspecified medication. She describes a sensation of pressure rather than irritation or itching and denies any current discharge.  Her current medications include cephalexin, which she has used in the past for UTI prevention. She has a prescription for Bactrim at the pharmacy, which she has not yet picked up. She denies any allergies to sulfa drugs and has previously taken Bactrim without adverse reactions.     Past Medical History:  Diagnosis Date   Abdominal pain    started in mid abd and spread to left side/on and off/since Jan 2020/ pt states, she has fluid in abd/unknown   Abnormal CT of the abdomen    Abnormal glucose tolerance test (GTT) during pregnancy, antepartum 09/17/2017   Abnormal Pap smear    normal 06/2013   Anal fissure 05/07/2020   Anxiety    ARF (acute renal failure) (HCC) 06/25/2018   Ascites 06/24/2018   Atypical chest pain 03/14/2022   Change in bowel habit  05/07/2020   Chronic abdominal pain 04/13/2020   Constipation    hx of   Constipation, chronic 06/24/2018   Depression    suicide attempt in college   Diabetes mellitus without complication (HCC)    gestational, no issues since pregnancy per her report - resolved   Free intraperitoneal air 05/08/2020   Gestational diabetes    /no meds now - resolved    Hepatitis    Hep B, managed by Dr. Kinnie Scales   History of chicken pox    History of depression 05/07/2020   History of gestational diabetes 05/07/2020   History of recurrent UTI (urinary tract infection) 05/08/2020   History of suicide attempt 2003   Attempt in college   History of urinary retention 04/15/2020   History of UTI    Metrorrhagia 05/29/2018   Oral hypoglycemic controlled White classification A2 gestational diabetes mellitus (GDM) 11/18/2017   Periumbilical pain 05/07/2020   Pneumoperitoneum 05/07/2020   Previous cesarean delivery, delivered: arrest of dilation  11/18/2017   Vaginal Pap smear, abnormal     Past Surgical History:  Procedure Laterality Date   BIOPSY  05/10/2020   Procedure: BIOPSY;  Surgeon: Benancio Deeds, MD;  Location: [facility];  Service: Gastroenterology;;   BLADDER REPAIR N/A 05/17/2020   Procedure: EXPLORATORY LAPAROTOMY WITH CYSTORRHAPHY;  Surgeon: Marcine Matar, MD;  Location: [facility];  Service: Urology;  Laterality: N/A;   CESAREAN SECTION N/A 01/26/2013   Procedure: Primary Cesarean Section Delivery Baby  Boy @ 0017, Apgars 4/8/9;  Surgeon: Freddrick March. Tenny Craw, MD;  Location: [facility];  Service: Obstetrics;  Laterality: N/A;   CESAREAN SECTION N/A 11/18/2017   Procedure: CESAREAN SECTION;  Surgeon: Olivia Mackie, MD;  Location: [facility];  Service: Obstetrics;  Laterality: N/A;   COLONOSCOPY  2020   DIAGNOSTIC LAPAROSCOPIC LIVER BIOPSY N/A 05/07/2020   Procedure: DIAGNOSTIC LAPAROSCOPIC;  Surgeon: Karie Soda, MD;  Location: [facility];  Service: General;  Laterality: N/A;   DIAGNOSTIC LAPAROSCOPY  08/25/2018   Dr Clifton James with Ob/Gyn3/2020   ESOPHAGOGASTRODUODENOSCOPY (EGD) WITH PROPOFOL N/A 05/10/2020   Procedure: ESOPHAGOGASTRODUODENOSCOPY (EGD) WITH PROPOFOL;  Surgeon: Benancio Deeds, MD;  Location: [facility];  Service: Gastroenterology;  Laterality: N/A;   ESOPHAGOGASTRODUODENOSCOPY ENDOSCOPY     IR TRANSCATHETER BX   04/18/2020   IR US GUIDE VASC ACCESS RIGHT  04/18/2020   IR VENOGRAM HEPATIC W HEMODYNAMIC EVALUATION  04/18/2020   NASAL SINUS SURGERY  1996   WISDOM TOOTH EXTRACTION      Family History  Adopted: Yes  Family history unknown: Yes    Social History   Tobacco Use   Smoking status: Every Day    Current packs/day: 0.00    Average packs/day: 0.5 packs/day for 20.0 years (10.0 ttl pk-yrs)    Types: Cigarettes    Last attempt to quit: 07/23/2011    Years since quitting: 12.1   Smokeless tobacco: Never   Tobacco comments:    smoked for 8 years, quit in 2013  Vaping Use   Vaping status: Never Used  Substance Use Topics   Alcohol use: Yes    Alcohol/week: 3.0 standard drinks of alcohol    Types: 3 Standard drinks or equivalent per week    Comment: 3 glasses of wine per day   Drug use: Not Currently    Types: Marijuana    Comment: Last use 12/19/18     Allergies  Allergen Reactions   Contrast Media [Iodinated Contrast Media] Anaphylaxis, Shortness Of Breath and Rash  IV contrast    Nsaids Other (See Comments)    Hx gastritis & gastric perforation    Prednisone & Diphenhydramine Other (See Comments)    Don't like to take it   Prednisone     Other reaction(s): Other Don't like to take it    Review of Systems  Genitourinary:  Positive for frequency.   NEGATIVE UNLESS OTHERWISE INDICATED IN HPI      Objective:     BP 110/64   Pulse 78   Temp 98.3 F (36.8 C) (Temporal)   Ht 4\' 11"  (1.499 m)   Wt 116 lb 9.6 oz (52.9 kg)   LMP 08/23/2023 (Approximate)   SpO2 98%   BMI 23.55 kg/m   Wt Readings from Last 3 Encounters:  09/16/23 116 lb 9.6 oz (52.9 kg)  08/14/23 114 lb 8 oz (51.9 kg)  07/28/23 113 lb 6.4 oz (51.4 kg)    BP Readings from Last 3 Encounters:  09/16/23 110/64  08/14/23 104/74  07/28/23 136/85     Physical Exam Vitals and nursing note reviewed.  Constitutional:      General: She is not in acute distress.    Appearance: Normal appearance.  She is not ill-appearing.  HENT:     Head: Normocephalic and atraumatic.  Cardiovascular:     Rate and Rhythm: Normal rate and regular rhythm.     Pulses: Normal pulses.     Heart sounds: Normal heart sounds.  Pulmonary:     Effort: Pulmonary effort is normal.     Breath sounds: Normal breath sounds.  Abdominal:     General: Abdomen is flat. Bowel sounds are normal.     Palpations: Abdomen is soft.     Tenderness: There is no right CVA tenderness or left CVA tenderness.  Skin:    General: Skin is warm and dry.  Neurological:     General: No focal deficit present.     Mental Status: She is alert.  Psychiatric:        Mood and Affect: Mood normal.             [place] Bryngelson M Zareah Hunzeker, PA-C

## 2023-09-17 ENCOUNTER — Ambulatory Visit: Payer: Self-pay | Admitting: Family

## 2023-09-17 ENCOUNTER — Encounter

## 2023-09-17 ENCOUNTER — Telehealth: Admitting: Physician Assistant

## 2023-09-17 DIAGNOSIS — N390 Urinary tract infection, site not specified: Secondary | ICD-10-CM

## 2023-09-17 NOTE — Telephone Encounter (Signed)
 Copied from CRM [id number]. Topic: Clinical - Red Word Triage >> Sep 17, 2023  1:03 PM Ivette P wrote: [facility] that prompted transfer to Nurse Triage: UTI - blood nurse, very cloudy, blood in urine.   Chief Complaint: UTI follow-up Symptoms: vomiting, flank pain Frequency: constant Pertinent Negatives: Patient denies fever Disposition: [] ED /[] Urgent Care (no appt availability in office) / [] Appointment(In office/virtual)/ []  [facility]/ [] Home Care/ [] Refused Recommended Disposition /[] [facility]/ [x]  Follow-up with PCP Additional Notes: started on bactrim yesterday after visit, now having left flank pain and vomiting. Asking if she should start taking Keflex and dc the Bactrim. Appt scheduled for tomorrow at 0830 am, will route message HP to clinic for medication advice.  Reason for Disposition  [1] POSITIVE urine test AND [2] side (flank) or lower back pain present  Answer Assessment - Initial Assessment Questions 1. TEST: "What kind of urine test was performed?" (e.g., urinalysis, urine dipstick)      Urinalysis and culture  2. URINALYSIS RESULT: "Was it positive or negative?"  If positive, document what was positive (e.g., LE, WBC, RBC, bacteria, epithelial cells)     *No Answer* 3. FEVER: "Do you have a fever?" If Yes, ask: "What is your temperature, how was it measured, and when did it start?"     No  4. FLANK PAIN: "Do you have any pain in your side?"     Left flank  5. OTHER SYMPTOMS: "Do you have any other symptoms?" (e.g., blood in urine, vomiting)     Nausea, vomiting, and headacahe  6. PREGNANCY: "Is there any chance you are pregnant?" "When was your last menstrual period?"     No  7. PHENAZOPYRIDINE (Uristat, Pyridium): "Have you taken phenazopyridine (turns your urine orange) recently?"     No  Protocols used: Urinalysis Results Follow-up Call-A-AH

## 2023-09-17 NOTE — Telephone Encounter (Signed)
 Reviewed

## 2023-09-17 NOTE — Progress Notes (Signed)
  Because of belly pain and vomiting with your other symptoms and concern for more complicated UTI and need for urine culture, I feel your condition warrants further evaluation and I recommend that you be seen in a face-to-face visit.   NOTE: There will be NO CHARGE for this E-Visit   If you are having a true medical emergency, please call 911.     For an urgent face to face visit, [facility] has multiple urgent care centers for your convenience.  Click the link below for the full list of locations and hours, walk-in wait times, appointment scheduling options and driving directions:  Urgent Care - [place], [place], [place], [place], [place], [place]  [place]     Your MyChart E-visit questionnaire answers were reviewed by a board certified advanced clinical practitioner to complete your personal care plan based on your specific symptoms.    Thank you for using e-Visits.

## 2023-09-18 ENCOUNTER — Encounter: Payer: Self-pay | Admitting: Family

## 2023-09-18 ENCOUNTER — Ambulatory Visit: Admitting: Family

## 2023-09-18 VITALS — BP 106/71 | HR 67 | Temp 97.5°F | Ht 59.0 in | Wt 117.4 lb

## 2023-09-18 DIAGNOSIS — K739 Chronic hepatitis, unspecified: Secondary | ICD-10-CM | POA: Insufficient documentation

## 2023-09-18 DIAGNOSIS — N921 Excessive and frequent menstruation with irregular cycle: Secondary | ICD-10-CM

## 2023-09-18 DIAGNOSIS — N3000 Acute cystitis without hematuria: Secondary | ICD-10-CM

## 2023-09-18 DIAGNOSIS — N926 Irregular menstruation, unspecified: Secondary | ICD-10-CM | POA: Insufficient documentation

## 2023-09-18 DIAGNOSIS — R519 Headache, unspecified: Secondary | ICD-10-CM | POA: Insufficient documentation

## 2023-09-18 DIAGNOSIS — E663 Overweight: Secondary | ICD-10-CM | POA: Insufficient documentation

## 2023-09-18 DIAGNOSIS — N760 Acute vaginitis: Secondary | ICD-10-CM | POA: Insufficient documentation

## 2023-09-18 HISTORY — DX: Irregular menstruation, unspecified: N92.6

## 2023-09-18 HISTORY — DX: Chronic hepatitis, unspecified: K73.9

## 2023-09-18 HISTORY — DX: Excessive and frequent menstruation with irregular cycle: N92.1

## 2023-09-18 HISTORY — DX: Headache, unspecified: R51.9

## 2023-09-18 HISTORY — DX: Overweight: E66.3

## 2023-09-18 HISTORY — DX: Acute vaginitis: N76.0

## 2023-09-18 LAB — URINE CULTURE
MICRO NUMBER:: 16302973
SPECIMEN QUALITY:: ADEQUATE

## 2023-09-18 MED ORDER — CEPHALEXIN 500 MG PO CAPS: 500.0000 mg | ORAL_CAPSULE | Freq: Three times a day (TID) | ORAL | 0 refills | Status: AC

## 2023-09-18 NOTE — Telephone Encounter (Signed)
 E. Coli is often found in stool and it can easily travel to her urethra (inside her vagina). Can be hard to clean rectum well if having loose stools or diarrhea.

## 2023-09-18 NOTE — Progress Notes (Signed)
 Patient ID: Kim Pacheco, female    DOB: 1986-03-04, 38 y.o.   MRN: [id number]  Chief Complaint  Patient presents with   Nausea    Pt c/o N/V and left sided pain. Pt stopped taking bactrim but still taking keflex from UTI sx on 4/8       Discussed the use of AI scribe software for clinical note transcription with the patient, who gave verbal consent to proceed.  History of Present Illness The patient, with a history of recurrent urinary tract infections (UTIs), presents with ongoing symptoms despite recent treatment. She initially sought care via an e-visit and was prescribed Bactrim. However, due to an inability to pick up the prescription, she self-medicated with leftover Bactrim from a previous tooth infection and took just 1 pill. This provided temporary relief, but symptoms returned within a few days. She then saw a provider here and provided a urine sample, which showed signs of infection. She was prescribed Cefalexin, which she has been taking since yesterday but only after intercourse as she was doing previously. She reports ongoing discomfort, nausea, and vomiting. She also reports pain in the lower abdomen and left side. She has a history of using Cefalexin prophylactically after sexual activity to prevent UTIs.  Assessment & Plan Urinary Tract Infection (UTI) - Recurrent UTI with persistent symptoms. Initial Bactrim treatment incomplete due to access and side effects, prefers Cephalexin.  Urine culture pending. Symptoms suggest unresolved infection. - Prescribe Cephalexin 500 mg TID for 7 days. - Advise taking Cephalexin after meals to reduce nausea. - Await urine culture results for antibiotic confirmation. - Advise maintaining hydration for light yellow to clear urine. - Instruct to report persistent pain post-antibiotics. - Resume post-coital prophylaxis as needed after completion of current Cephalexin course.   Subjective:    Outpatient Medications Prior to Visit   Medication Sig Dispense Refill   albuterol (VENTOLIN HFA) 108 (90 Base) MCG/ACT inhaler Inhale 2 puffs into the lungs every 6 (six) hours as needed for wheezing or shortness of breath (Cough). 8 g 2   benzonatate (TESSALON) 100 MG capsule Take 1 capsule every 8 hours as needed cough 30 capsule 0   cephALEXin (KEFLEX) 500 MG capsule Take 1 capsule po after intercourse to prevent UTI. 30 capsule 2   cyclobenzaprine (FLEXERIL) 10 MG tablet Take 0.5-1 tablets (5-10 mg total) by mouth 3 (three) times daily as needed for muscle spasms. 30 tablet 0   PHENTERMINE HCL PO Take by mouth. Pt is unsure of dose, Prescribed by [facility] medical center([address])     sulfamethoxazole-trimethoprim (BACTRIM DS) 800-160 MG tablet Take 1 tablet by mouth 2 (two) times daily for 7 days. 14 tablet 0   topiramate (TOPAMAX) 25 MG tablet Take 25 mg by mouth daily.     diclofenac Sodium (VOLTAREN) 1 % GEL Apply 4 g topically 4 (four) times daily as needed. 100 g 1   No facility-administered medications prior to visit.   Past Medical History:  Diagnosis Date   Abdominal pain    started in mid abd and spread to left side/on and off/since Jan 2020/ pt states, she has fluid in abd/unknown   Abnormal CT of the abdomen    Abnormal glucose tolerance test (GTT) during pregnancy, antepartum 09/17/2017   Abnormal Pap smear    normal 06/2013   Anal fissure 05/07/2020   Anxiety    ARF (acute renal failure) (HCC) 06/25/2018   Ascites 06/24/2018   Atypical chest pain 03/14/2022   Change  in bowel habit 05/07/2020   Chronic abdominal pain 04/13/2020   Chronic hepatitis (HCC) 09/18/2023   Constipation    hx of   Constipation, chronic 06/24/2018   Depression    suicide attempt in college   Diabetes mellitus without complication (HCC)    gestational, no issues since pregnancy per her report - resolved   Free intraperitoneal air 05/08/2020   Gestational diabetes    /no meds now - resolved   Gestational diabetes  mellitus, class A2 09/09/2017   h.o first pregnancy, early 1 hr     Headache 09/18/2023   Hepatic fibrosis 11/30/2020   In October 2019 she had an ultrasound with elastography that showed hepatic steatosis and estimated F0-F1 fibrosis.      She underwent transjugular liver biopsy in November 2021 with with hepatic venous pressure gradient of 13 indicating portal hypertension, free hepatic venous pressure was 17 mmHg with a wedge of 30 mmHg.   Biopsy showed mildly active chronic hepatitis grade 2/4, stage I-2 fibros   Hepatic steatosis 05/07/2020   Patient with a history of hepatic steatosis on biopsy and fibroscan in the setting of chronic alcohol use.     Reviewed with patient that ongoing alcohol use may contribute to continued fat within the liver which can cause inflammation and progression of scarring.     Reviewed the need for a low fat/cholesterol/carbohydrate diet, increase exercise, moderate weight loss, and discontinuation of alco   Hepatitis    Hep B, managed by Dr. Kinnie Scales   History of cesarean section 05/08/2017   arrest of dilation, likely repeat c/s     History of chicken pox    History of depression 05/07/2020   History of gestational diabetes 05/07/2020   History of recurrent UTI (urinary tract infection) 05/08/2020   History of suicide attempt 2003   Attempt in college   History of urinary retention 04/15/2020   History of UTI    Irregular intermenstrual bleeding 09/18/2023   Menstrual period late 09/18/2023   Metrorrhagia 05/29/2018   Noncompliance 05/24/2021   Patient continues to be noncompliant with medication dosing and laboratory testing despite multiple conversations at previous office visits.     Again I had a frank discussion with the patient regarding:   Pharmacy refill information and  lab results continue to show that she is not taking her vemlidy daily as prescribed.     As we have discussed at multiple prior visits failing to take her veml   Oral hypoglycemic  controlled White classification A2 gestational diabetes mellitus (GDM) 11/18/2017   Overweight with body mass index (BMI) 25.0-29.9 09/18/2023   Periumbilical pain 05/07/2020   Pneumoperitoneum 05/07/2020   Portal hypertension (HCC) 12/07/2020   Patient had evidence of portal hypertension on venogram however this may have been in the setting of acute alcohol hepatitis.       Liver biopsy at that time reported F1-F2 fibrosis.       She had EGD in 2021 with no evidence of esophageal varices.       Her previously reported ascites may have been extravasation of urine from a perforated bladder.     FibroScan in November 2022 with liver stiffne   Postpartum state 09/18/2023   Pregnancy 05/12/2017   Previous cesarean delivery, delivered: arrest of dilation  11/18/2017   Vaginal Pap smear, abnormal    Vaginitis 09/18/2023   Past Surgical History:  Procedure Laterality Date   BIOPSY  05/10/2020   Procedure: BIOPSY;  Surgeon: Ileene Patrick  P, MD;  Location: [facility];  Service: Gastroenterology;;   BLADDER REPAIR N/A 05/17/2020   Procedure: EXPLORATORY LAPAROTOMY WITH CYSTORRHAPHY;  Surgeon: Marcine Matar, MD;  Location: [facility];  Service: Urology;  Laterality: N/A;   CESAREAN SECTION N/A 01/26/2013   Procedure: Primary Cesarean Section Delivery Baby  Boy @ 0017, Apgars 4/8/9;  Surgeon: Freddrick March. Tenny Craw, MD;  Location: [facility];  Service: Obstetrics;  Laterality: N/A;   CESAREAN SECTION N/A 11/18/2017   Procedure: CESAREAN SECTION;  Surgeon: Olivia Mackie, MD;  Location: [facility];  Service: Obstetrics;  Laterality: N/A;   COLONOSCOPY  2020   DIAGNOSTIC LAPAROSCOPIC LIVER BIOPSY N/A 05/07/2020   Procedure: DIAGNOSTIC LAPAROSCOPIC;  Surgeon: Karie Soda, MD;  Location: [facility];  Service: General;  Laterality: N/A;   DIAGNOSTIC LAPAROSCOPY  08/25/2018   Dr Clifton James with Ob/Gyn3/2020   ESOPHAGOGASTRODUODENOSCOPY (EGD) WITH PROPOFOL N/A 05/10/2020   Procedure: ESOPHAGOGASTRODUODENOSCOPY  (EGD) WITH PROPOFOL;  Surgeon: Benancio Deeds, MD;  Location: [facility];  Service: Gastroenterology;  Laterality: N/A;   ESOPHAGOGASTRODUODENOSCOPY ENDOSCOPY     IR TRANSCATHETER BX  04/18/2020   IR US GUIDE VASC ACCESS RIGHT  04/18/2020   IR VENOGRAM HEPATIC W HEMODYNAMIC EVALUATION  04/18/2020   NASAL SINUS SURGERY  1996   WISDOM TOOTH EXTRACTION     Allergies  Allergen Reactions   Contrast Media [Iodinated Contrast Media] Anaphylaxis, Shortness Of Breath and Rash    IV contrast    Nsaids Other (See Comments)    Hx gastritis & gastric perforation    Prednisone & Diphenhydramine Other (See Comments)    Don't like to take it   Prednisone     Other reaction(s): Other Don't like to take it      Objective:    Physical Exam Vitals and nursing note reviewed.  Constitutional:      Appearance: Normal appearance.  Cardiovascular:     Rate and Rhythm: Normal rate and regular rhythm.  Pulmonary:     Effort: Pulmonary effort is normal.     Breath sounds: Normal breath sounds.  Musculoskeletal:        General: Normal range of motion.  Skin:    General: Skin is warm and dry.  Neurological:     Mental Status: She is alert.  Psychiatric:        Mood and Affect: Mood normal.        Behavior: Behavior normal.    BP 106/71 (BP Location: Left Arm, Patient Position: Sitting, Cuff Size: Normal)   Pulse 67   Temp (!) 97.5 F (36.4 C) (Temporal)   Ht 4\' 11"  (1.499 m)   Wt 117 lb 6 oz (53.2 kg)   LMP 08/23/2023 (Approximate)   SpO2 99%   BMI 23.71 kg/m  Wt Readings from Last 3 Encounters:  09/18/23 117 lb 6 oz (53.2 kg)  09/16/23 116 lb 9.6 oz (52.9 kg)  08/14/23 114 lb 8 oz (51.9 kg)       Dulce Sellar, NP

## 2023-09-18 NOTE — Progress Notes (Signed)
 Pt notified of results in separate MyChart message.

## 2023-09-23 ENCOUNTER — Encounter (HOSPITAL_BASED_OUTPATIENT_CLINIC_OR_DEPARTMENT_OTHER): Payer: Self-pay

## 2023-09-24 DIAGNOSIS — R635 Abnormal weight gain: Secondary | ICD-10-CM | POA: Diagnosis not present

## 2023-10-17 ENCOUNTER — Other Ambulatory Visit: Payer: Self-pay

## 2023-10-17 ENCOUNTER — Ambulatory Visit: Admitting: Family

## 2023-10-17 ENCOUNTER — Encounter: Payer: Self-pay | Admitting: Family

## 2023-10-17 ENCOUNTER — Other Ambulatory Visit (HOSPITAL_BASED_OUTPATIENT_CLINIC_OR_DEPARTMENT_OTHER): Payer: Self-pay

## 2023-10-17 VITALS — BP 114/78 | HR 96 | Temp 98.1°F | Ht 59.0 in | Wt 115.4 lb

## 2023-10-17 DIAGNOSIS — Z411 Encounter for cosmetic surgery: Secondary | ICD-10-CM | POA: Diagnosis not present

## 2023-10-17 DIAGNOSIS — R051 Acute cough: Secondary | ICD-10-CM

## 2023-10-17 DIAGNOSIS — F321 Major depressive disorder, single episode, moderate: Secondary | ICD-10-CM

## 2023-10-17 DIAGNOSIS — L987 Excessive and redundant skin and subcutaneous tissue: Secondary | ICD-10-CM | POA: Diagnosis not present

## 2023-10-17 MED ORDER — BENZONATATE 100 MG PO CAPS
100.0000 mg | ORAL_CAPSULE | Freq: Two times a day (BID) | ORAL | 0 refills | Status: DC | PRN
Start: 2023-10-17 — End: 2023-10-22
  Filled 2023-10-17: qty 20, 10d supply, fill #0

## 2023-10-17 MED ORDER — METHYLPREDNISOLONE ACETATE 40 MG/ML IJ SUSP
60.0000 mg | Freq: Once | INTRAMUSCULAR | Status: AC
  Administered 2023-10-17: 60 mg via INTRAMUSCULAR

## 2023-10-17 NOTE — Progress Notes (Addendum)
 Patient ID: Kim Pacheco, female    DOB: 20-Mar-1986, 38 y.o.   MRN: [id number]  Chief Complaint  Patient presents with  . Referral    Pt would like a referral for tummy tuck.   . Cough    Pt c/o cough, present since this morning.  Discussed the use of AI scribe software for clinical note transcription with the patient, who gave verbal consent to proceed.  History of Present Illness Kim Pacheco is a 38 year old female who presents with upper respiratory symptoms and concerns about weight management.  She experiences a stuffy nose, sore throat, and pressure in her lungs, which began yesterday. Uncontrollable sneezing occurred on Wednesday, initially thought to be allergies. There is mucus in her throat, but no ear pain. She does not regularly use allergy medications but considers using leftover Tessalon  Perles for her cough. She has a 14-hour wedding event tomorrow and seeks symptom relief. She also needs a refill for her Prozac  that she takes for depression. She struggles with weight management, particularly abdominal weight, following two C-sections. Despite consulting a nutritionist, exercising, and trying diets like keto, she has not achieved desired results. Currently on phentermine, she has lost 15-20 pounds but remains concerned about excess skin and abdominal appearance. She is considering cosmetic procedures such as a tummy tuck or liposuction.  Assessment & Plan Acute upper respiratory infection - Acute upper respiratory symptoms possibly allergy-related. Has long weekend of work ahead. - Administered DepoMedrol 60mg  for symptom relief. - Recommended OTC antihistamines: Claritin, Zyrtec , Xyzal, 1 pill qd. - Increase water  intake, 2L qd - Advised Tessalon  Perles (at [facility]) for cough relief if available. - Call office back if sx not improving by end of next week  Excess stomach skin -  Persistent obesity despite interventions. Considering cosmetic procedures  for excess skin. - Referred to plastic surgery in [place] for consultation.        Subjective:     Outpatient Medications Prior to Visit  Medication Sig Dispense Refill  . albuterol  (VENTOLIN  HFA) 108 (90 Base) MCG/ACT inhaler Inhale 2 puffs into the lungs every 6 (six) hours as needed for wheezing or shortness of breath (Cough). 8 g 2  . cephALEXin  (KEFLEX ) 500 MG capsule Take 1 capsule po after intercourse to prevent UTI. 30 capsule 2  . PHENTERMINE HCL PO Take by mouth. Pt is unsure of dose, Prescribed by [facility]([address])    . topiramate (TOPAMAX) 25 MG tablet Take 25 mg by mouth daily.    . benzonatate  (TESSALON ) 100 MG capsule Take 1 capsule every 8 hours as needed cough (Patient not taking: Reported on 10/17/2023) 30 capsule 0  . cyclobenzaprine  (FLEXERIL ) 10 MG tablet Take 0.5-1 tablets (5-10 mg total) by mouth 3 (three) times daily as needed for muscle spasms. (Patient not taking: Reported on 10/17/2023) 30 tablet 0   No facility-administered medications prior to visit.   Past Medical History:  Diagnosis Date  . Abdominal pain    started in mid abd and spread to left side/on and off/since Jan 2020/ pt states, she has fluid in abd/unknown  . Abnormal CT of the abdomen   . Abnormal glucose tolerance test (GTT) during pregnancy, antepartum 09/17/2017  . Abnormal Pap smear    normal 06/2013  . Anal fissure 05/07/2020  . Anxiety   . ARF (acute renal failure) (HCC) 06/25/2018  . Ascites 06/24/2018  . Atypical chest pain 03/14/2022  . Change in bowel habit 05/07/2020  .  Chronic abdominal pain 04/13/2020  . Chronic hepatitis (HCC) 09/18/2023  . Constipation    hx of  . Constipation, chronic 06/24/2018  . Depression    suicide attempt in college  . Diabetes mellitus without complication (HCC)    gestational, no issues since pregnancy per her report - resolved  . Free intraperitoneal air 05/08/2020  . Gestational diabetes    /no meds now - resolved  .  Gestational diabetes mellitus, class A2 09/09/2017   h.o first pregnancy, early 1 hr    . Headache 09/18/2023  . Hepatic fibrosis 11/30/2020   In October 2019 she had an ultrasound with elastography that showed hepatic steatosis and estimated F0-F1 fibrosis.      She underwent transjugular liver biopsy in November 2021 with with hepatic venous pressure gradient of 13 indicating portal hypertension, free hepatic venous pressure was 17 mmHg with a wedge of 30 mmHg.   Biopsy showed mildly active chronic hepatitis grade 2/4, stage I-2 fibros  . Hepatic steatosis 05/07/2020   Patient with a history of hepatic steatosis on biopsy and fibroscan in the setting of chronic alcohol use.     Reviewed with patient that ongoing alcohol use may contribute to continued fat within the liver which can cause inflammation and progression of scarring.     Reviewed the need for a low fat/cholesterol/carbohydrate diet, increase exercise, moderate weight loss, and discontinuation of alco  . Hepatitis    Hep B, managed by Dr. Andriette Keeling  . History of cesarean section 05/08/2017   arrest of dilation, likely repeat c/s    . History of chicken pox   . History of depression 05/07/2020  . History of gestational diabetes 05/07/2020  . History of recurrent UTI (urinary tract infection) 05/08/2020  . History of suicide attempt 2003   Attempt in college  . History of urinary retention 04/15/2020  . History of UTI   . Irregular intermenstrual bleeding 09/18/2023  . Menstrual period late 09/18/2023  . Metrorrhagia 05/29/2018  . Noncompliance 05/24/2021   Patient continues to be noncompliant with medication dosing and laboratory testing despite multiple conversations at previous office visits.     Again I had a frank discussion with the patient regarding:  . Pharmacy refill information and  lab results continue to show that she is not taking her vemlidy daily as prescribed.    . As we have discussed at multiple prior visits failing  to take her veml  . Oral hypoglycemic controlled White classification A2 gestational diabetes mellitus (GDM) 11/18/2017  . Overweight with body mass index (BMI) 25.0-29.9 09/18/2023  . Periumbilical pain 05/07/2020  . Pneumoperitoneum 05/07/2020  . Portal hypertension (HCC) 12/07/2020   Patient had evidence of portal hypertension on venogram however this may have been in the setting of acute alcohol hepatitis.       Liver biopsy at that time reported F1-F2 fibrosis.       She had EGD in 2021 with no evidence of esophageal varices.       Her previously reported ascites may have been extravasation of urine from a perforated bladder.     FibroScan in November 2022 with liver stiffne  . Postpartum state 09/18/2023  . Pregnancy 05/12/2017  . Previous cesarean delivery, delivered: arrest of dilation  11/18/2017  . Vaginal Pap smear, abnormal   . Vaginitis 09/18/2023   Past Surgical History:  Procedure Laterality Date  . BIOPSY  05/10/2020   Procedure: BIOPSY;  Surgeon: Ace Holder, MD;  Location: [facility];  Service: Gastroenterology;;  . BLADDER REPAIR N/A 05/17/2020   Procedure: EXPLORATORY LAPAROTOMY WITH CYSTORRHAPHY;  Surgeon: Trent Frizzle, MD;  Location: [facility];  Service: Urology;  Laterality: N/A;  . CESAREAN SECTION N/A 01/26/2013   Procedure: Primary Cesarean Section Delivery Baby  Boy @ 0017, Apgars 4/8/9;  Surgeon: Elridge Haller. Avanell Bob, MD;  Location: [facility];  Service: Obstetrics;  Laterality: N/A;  . CESAREAN SECTION N/A 11/18/2017   Procedure: CESAREAN SECTION;  Surgeon: Meriam Stamp, MD;  Location: [facility];  Service: Obstetrics;  Laterality: N/A;  . COLONOSCOPY  2020  . DIAGNOSTIC LAPAROSCOPIC LIVER BIOPSY N/A 05/07/2020   Procedure: DIAGNOSTIC LAPAROSCOPIC;  Surgeon: Candyce Champagne, MD;  Location: [facility];  Service: General;  Laterality: N/A;  . DIAGNOSTIC LAPAROSCOPY  08/25/2018   Dr Monroe Antig[place] with Ob/Gyn3/2020  . ESOPHAGOGASTRODUODENOSCOPY (EGD) WITH PROPOFOL   N/A 05/10/2020   Procedure: ESOPHAGOGASTRODUODENOSCOPY (EGD) WITH PROPOFOL ;  Surgeon: Ace Holder, MD;  Location: [facility];  Service: Gastroenterology;  Laterality: N/A;  . ESOPHAGOGASTRODUODENOSCOPY ENDOSCOPY    . IR TRANSCATHETER BX  04/18/2020  . IR US  GUIDE VASC ACCESS RIGHT  04/18/2020  . IR VENOGRAM HEPATIC W HEMODYNAMIC EVALUATION  04/18/2020  . NASAL SINUS SURGERY  1996  . WISDOM TOOTH EXTRACTION     Allergies  Allergen Reactions  . Contrast Media [Iodinated Contrast Media] Anaphylaxis, Shortness Of Breath and Rash    IV contrast   . Nsaids Other (See Comments)    Hx gastritis & gastric perforation   . Prednisone  & Diphenhydramine  Other (See Comments)    Don't like to take it  . Prednisone      Other reaction(s): Other Don't like to take it      Objective:    Physical Exam Vitals and nursing note reviewed.  Constitutional:      Appearance: Normal appearance.  HENT:     Nose: Congestion and rhinorrhea present. Rhinorrhea is clear.     Mouth/Throat:     Mouth: Mucous membranes are moist.     Pharynx: Postnasal drip present. No pharyngeal swelling, oropharyngeal exudate, posterior oropharyngeal erythema or uvula swelling.     Tonsils: No tonsillar exudate or tonsillar abscesses.  Cardiovascular:     Rate and Rhythm: Normal rate and regular rhythm.  Pulmonary:     Effort: Pulmonary effort is normal.     Breath sounds: Normal breath sounds.  Musculoskeletal:        General: Normal range of motion.  Skin:    General: Skin is warm and dry.  Neurological:     Mental Status: She is alert.  Psychiatric:        Mood and Affect: Mood normal.        Behavior: Behavior normal.   BP 114/78 (BP Location: Left Arm, Patient Position: Sitting, Cuff Size: Large)   Pulse 96   Temp 98.1 F (36.7 C) (Temporal)   Ht 4\' 11"  (1.499 m)   Wt 115 lb 6 oz (52.3 kg)   LMP 09/23/2023 (Approximate)   SpO2 100%   BMI 23.30 kg/m  Wt Readings from Last 3 Encounters:  10/17/23  115 lb 6 oz (52.3 kg)  09/18/23 117 lb 6 oz (53.2 kg)  09/16/23 116 lb 9.6 oz (52.9 kg)       Versa Gore, NP

## 2023-10-21 ENCOUNTER — Other Ambulatory Visit (HOSPITAL_BASED_OUTPATIENT_CLINIC_OR_DEPARTMENT_OTHER): Payer: Self-pay

## 2023-10-21 ENCOUNTER — Other Ambulatory Visit: Payer: Self-pay

## 2023-10-21 DIAGNOSIS — F419 Anxiety disorder, unspecified: Secondary | ICD-10-CM

## 2023-10-21 MED ORDER — FLUOXETINE HCL 10 MG PO TABS
10.0000 mg | ORAL_TABLET | Freq: Every day | ORAL | 1 refills | Status: DC
  Filled 2023-10-21: qty 90, 90d supply, fill #0

## 2023-10-21 NOTE — Telephone Encounter (Signed)
 ok to fill the fluoxetine  10mg  qd, 90 pills with 1 refill - Generalized anxiety DX , thx

## 2023-10-22 ENCOUNTER — Other Ambulatory Visit: Payer: Self-pay

## 2023-10-22 ENCOUNTER — Other Ambulatory Visit (HOSPITAL_BASED_OUTPATIENT_CLINIC_OR_DEPARTMENT_OTHER): Payer: Self-pay

## 2023-10-22 DIAGNOSIS — F419 Anxiety disorder, unspecified: Secondary | ICD-10-CM

## 2023-10-22 DIAGNOSIS — R051 Acute cough: Secondary | ICD-10-CM

## 2023-10-22 MED ORDER — BENZONATATE 100 MG PO CAPS: 100.0000 mg | ORAL_CAPSULE | Freq: Two times a day (BID) | ORAL | 0 refills | Status: DC | PRN

## 2023-10-22 MED ORDER — FLUOXETINE HCL 10 MG PO TABS: 10.0000 mg | ORAL_TABLET | Freq: Every day | ORAL | 1 refills | Status: DC

## 2023-10-24 DIAGNOSIS — R635 Abnormal weight gain: Secondary | ICD-10-CM | POA: Diagnosis not present

## 2023-10-28 ENCOUNTER — Encounter: Payer: Self-pay | Admitting: Family

## 2023-10-28 NOTE — Telephone Encounter (Signed)
needs OV

## 2023-10-29 ENCOUNTER — Encounter: Payer: Self-pay | Admitting: Family

## 2023-10-29 ENCOUNTER — Ambulatory Visit: Payer: Self-pay

## 2023-10-29 ENCOUNTER — Ambulatory Visit: Admitting: Family

## 2023-10-29 ENCOUNTER — Ambulatory Visit (HOSPITAL_BASED_OUTPATIENT_CLINIC_OR_DEPARTMENT_OTHER): Admitting: Pulmonary Disease

## 2023-10-29 VITALS — BP 131/93 | HR 89 | Temp 98.0°F | Ht 59.0 in | Wt 115.8 lb

## 2023-10-29 DIAGNOSIS — R0789 Other chest pain: Secondary | ICD-10-CM

## 2023-10-29 DIAGNOSIS — F321 Major depressive disorder, single episode, moderate: Secondary | ICD-10-CM | POA: Diagnosis not present

## 2023-10-29 DIAGNOSIS — R030 Elevated blood-pressure reading, without diagnosis of hypertension: Secondary | ICD-10-CM

## 2023-10-29 DIAGNOSIS — K625 Hemorrhage of anus and rectum: Secondary | ICD-10-CM

## 2023-10-29 DIAGNOSIS — F411 Generalized anxiety disorder: Secondary | ICD-10-CM | POA: Diagnosis not present

## 2023-10-29 DIAGNOSIS — R194 Change in bowel habit: Secondary | ICD-10-CM | POA: Insufficient documentation

## 2023-10-29 DIAGNOSIS — O24419 Gestational diabetes mellitus in pregnancy, unspecified control: Secondary | ICD-10-CM | POA: Insufficient documentation

## 2023-10-29 HISTORY — DX: Hemorrhage of anus and rectum: K62.5

## 2023-10-29 HISTORY — DX: Change in bowel habit: R19.4

## 2023-10-29 HISTORY — DX: Gestational diabetes mellitus in pregnancy, unspecified control: O24.419

## 2023-10-29 MED ORDER — PROPRANOLOL HCL 10 MG PO TABS: 10.0000 mg | ORAL_TABLET | Freq: Three times a day (TID) | ORAL | 1 refills | Status: AC | PRN

## 2023-10-29 NOTE — Telephone Encounter (Signed)
 Chief Complaint: chest pain Symptoms: see above Frequency: 1 wk, but has hx of same Pertinent Negatives: Patient denies SOB, dizziness, weakness, palpitations, diaphoresis, vomiting Disposition: [] ED /[] Urgent Care (no appt availability in office) / [x] Appointment(In office/virtual)/ []  [facility]/ [] Home Care/ [] Refused Recommended Disposition /[] [place] [facility]/ []  Follow-up with PCP Additional Notes: Staff member at [facility] transferred pt to NT. Pt has an appt at 1600 today with [facility] for CP.  Pt reports intermittent chest "pressure" for 1 wk. Pt denies any chest pain or pressure at this time. Pt denies any symptoms at this time. Pt states she has had chest pressure before and was diagnosed with anxiety and prescribed fluoxetine  and an inhaler. Pt states she is still taking both medications. Pt states on Saturday she got into her car after working and developed pain under her L breast that has since resolved. Pt rates the pressure a 4/10 but she is symptomatic.   RN feels it is safe for pt to wait to be seen at 1600 today given she has no symptoms at this time. RN advised the pt that if she develops chest pain or pressure, SOB, diaphoresis, vomiting, dizziness, weakness, or heart palpitations she needs to call 911. Pt verbalized understanding.    Reason for Disposition  [1] Chest pain lasts > 5 minutes AND [2] occurred > 3 days ago (72 hours) AND [3] NO chest pain or cardiac symptoms now  Answer Assessment - Initial Assessment Questions 1. LOCATION: "Where does it hurt?"       L side; Saturday pt was working and when she sat in her car she got a stabbing pain under her breast on her L side 2. RADIATION: "Does the pain go anywhere else?" (e.g., into neck, jaw, arms, back)     "I did have some pain at the base of my skull, but like I said I worked all weekend and thought it was from me working a lot", "I did see a chiropractor yesterday so the pain in my neck is  gone" 3. ONSET: "When did the chest pain begin?" (Minutes, hours or days)      Last week; pt states she has had this sensation before and was diagnosed with anxiety and prescribed fluoxetine  and an inhaler 4. PATTERN: "Does the pain come and go, or has it been constant since it started?"  "Does it get worse with exertion?"      Comes and goes 5. DURATION: "How long does it last" (e.g., seconds, minutes, hours)     "All day" 6. SEVERITY: "How bad is the pain?"  (e.g., Scale 1-10; mild, moderate, or severe)    - MILD (1-3): doesn't interfere with normal activities     - MODERATE (4-7): interferes with normal activities or awakens from sleep    - SEVERE (8-10): excruciating pain, unable to do any normal activities       4/10; none right now 7. CARDIAC RISK FACTORS: "Do you have any history of heart problems or risk factors for heart disease?" (e.g., angina, prior heart attack; diabetes, high blood pressure, high cholesterol, smoker, or strong family history of heart disease)     On/off smoker, portal hypertension, alcohol abuse  8. PULMONARY RISK FACTORS: "Do you have any history of lung disease?"  (e.g., blood clots in lung, asthma, emphysema, birth control pills)     Has an upcoming appt w/ a pulmonary doctor for a regular check-up, per pt 9. CAUSE: "What do you think is  causing the chest pain?"     denies 10. OTHER SYMPTOMS: "Do you have any other symptoms?" (e.g., dizziness, nausea, vomiting, sweating, fever, difficulty breathing, cough)       Denies SOB. Denies dizziness or weakness. Denies nausea and vomiting. Denies diaphoresis. Denies fever. Pt states she was diagnosed with walking pneumonia in March and was experiencing nasal congestion last week. Denies heart palpitations.  Protocols used: Chest Pain-A-AH

## 2023-10-29 NOTE — Assessment & Plan Note (Signed)
 Intermittent chest pain likely due to stress-related anxiety or panic attack. Low cardiac risk. Propranolol discussed for situational anxiety in addition to Prozac  10mg  qd. - Prescribe propranolol as needed for situational anxiety. Assess tolerance at home prior to taking at work. - Advise emergency care for severe, persistent chest pain, arm pain, arm tingling, speech problems, or facial droop. - F/U in 2-4weeks or sooner if needed

## 2023-10-29 NOTE — Progress Notes (Signed)
 Patient ID: Kim Pacheco, female    DOB: 1985-10-29, 38 y.o.   MRN: [id number]  Chief Complaint  Patient presents with   Anxiety    Pt c/o chest pressure and sharp pain under left breast intermittent since Saturday after working. Pt denies any pain today.  Discussed the use of AI scribe software for clinical note transcription with the patient, who gave verbal consent to proceed.  History of Present Illness Kim Pacheco is a 38 year old female who presents with chest pressure and difficulty breathing.  She experiences a sensation of pressure or weight on her chest, described as 'really heavy,' with occasional difficulty breathing. These symptoms began on Saturday during a stressful situation while working.  Sitting down and drinking water  provided some relief, but she then experienced a sharp, stabbing chest pain.  She has had elevated blood pressure noted during a previous hospital visit for bladder issues. She is on fluoxetine , refilled on the tenth of this month, with no dose increase for over a year.  She smokes cigarettes and THC, drinks alcohol and consumes a diet high in red meat. She has not had a recent physical examination to check cholesterol levels.  In the past, she experienced similar breathing difficulties, leading to the prescription of an inhaler. She associates these episodes with stress and smoking.  Assessment & Plan Atypical CP vs panic attack -  Intermittent chest pain likely due to stress-related anxiety or panic attack. Low cardiac risk. Propranolol discussed for situational anxiety in addition to Prozac  10mg  qd. - Prescribe propranolol as needed for situational anxiety. Assess tolerance at home prior to taking at work. - Advise emergency care for severe, persistent chest pain, arm pain, arm tingling, speech problems, or facial droop. - F/U in 2-4weeks or sooner if needed  Elevated blood pressure Slightly elevated blood pressure, possibly  stress-related. Smoking and diet may contribute. Emphasized smoking cessation. - Recommend smoking cessation. - Advise dietary modifications to reduce red meat intake and sodium intake. - Increase water  intake, at least 2 liters daily. - Suggest follow-up physical exam with fasting labs for cholesterol and cardiovascular risk assessment.  Depression Managed with fluoxetine , stable dose for over a year. Discussed dose increase for stress symptoms, but she prefers current dose and trying Propranolol prn. - Continue fluoxetine  at current dose. - Discussed option to increase dose if stress symptoms persist, but she opted to maintain current dose.        Subjective:     Outpatient Medications Prior to Visit  Medication Sig Dispense Refill   albuterol  (VENTOLIN  HFA) 108 (90 Base) MCG/ACT inhaler Inhale 2 puffs into the lungs every 6 (six) hours as needed for wheezing or shortness of breath (Cough). 8 g 2   cephALEXin  (KEFLEX ) 500 MG capsule Take 1 capsule po after intercourse to prevent UTI. 30 capsule 2   FLUoxetine  (PROZAC ) 10 MG tablet Take 1 tablet (10 mg total) by mouth daily. 90 tablet 1   PHENTERMINE HCL PO Take by mouth. Pt is unsure of dose, Prescribed by [facility]([address])     topiramate (TOPAMAX) 25 MG tablet Take 25 mg by mouth daily.     benzonatate  (TESSALON ) 100 MG capsule Take 1 capsule (100 mg total) by mouth 2 (two) times daily as needed for cough. (Patient not taking: Reported on 10/29/2023) 20 capsule 0   No facility-administered medications prior to visit.   Past Medical History:  Diagnosis Date   Abdominal pain    started  in mid abd and spread to left side/on and off/since Jan 2020/ pt states, she has fluid in abd/unknown   Abnormal CT of the abdomen    Abnormal glucose tolerance test (GTT) during pregnancy, antepartum 09/17/2017   Abnormal Pap smear    normal 06/2013   Anal fissure 05/07/2020   Anxiety    ARF (acute renal failure) (HCC)  06/25/2018   Ascites 06/24/2018   Atypical chest pain 03/14/2022   Change in bowel habit 05/07/2020   Change in bowel habits 10/29/2023   Chronic abdominal pain 04/13/2020   Chronic hepatitis (HCC) 09/18/2023   Constipation    hx of   Constipation, chronic 06/24/2018   Depression    suicide attempt in college   Diabetes mellitus without complication (HCC)    gestational, no issues since pregnancy per her report - resolved   Free intraperitoneal air 05/08/2020   Gestational diabetes    /no meds now - resolved   Gestational diabetes mellitus, class A2 09/09/2017   h.o first pregnancy, early 1 hr     Headache 09/18/2023   Hepatic fibrosis 11/30/2020   In October 2019 she had an ultrasound with elastography that showed hepatic steatosis and estimated F0-F1 fibrosis.      She underwent transjugular liver biopsy in November 2021 with with hepatic venous pressure gradient of 13 indicating portal hypertension, free hepatic venous pressure was 17 mmHg with a wedge of 30 mmHg.   Biopsy showed mildly active chronic hepatitis grade 2/4, stage I-2 fibros   Hepatic steatosis 05/07/2020   Patient with a history of hepatic steatosis on biopsy and fibroscan in the setting of chronic alcohol use.     Reviewed with patient that ongoing alcohol use may contribute to continued fat within the liver which can cause inflammation and progression of scarring.     Reviewed the need for a low fat/cholesterol/carbohydrate diet, increase exercise, moderate weight loss, and discontinuation of alco   Hepatitis    Hep B, managed by Dr. Andriette Keeling   History of cesarean section 05/08/2017   arrest of dilation, likely repeat c/s     History of chicken pox    History of depression 05/07/2020   History of gestational diabetes 05/07/2020   History of recurrent UTI (urinary tract infection) 05/08/2020   History of suicide attempt 2003   Attempt in college   History of urinary retention 04/15/2020   History of UTI     Irregular intermenstrual bleeding 09/18/2023   Menstrual period late 09/18/2023   Metrorrhagia 05/29/2018   Noncompliance 05/24/2021   Patient continues to be noncompliant with medication dosing and laboratory testing despite multiple conversations at previous office visits.     Again I had a frank discussion with the patient regarding:   Pharmacy refill information and  lab results continue to show that she is not taking her vemlidy daily as prescribed.     As we have discussed at multiple prior visits failing to take her veml   Oral hypoglycemic controlled White classification A2 gestational diabetes mellitus (GDM) 11/18/2017   Overweight with body mass index (BMI) 25.0-29.9 09/18/2023   Periumbilical pain 05/07/2020   Pneumoperitoneum 05/07/2020   Portal hypertension (HCC) 12/07/2020   Patient had evidence of portal hypertension on venogram however this may have been in the setting of acute alcohol hepatitis.       Liver biopsy at that time reported F1-F2 fibrosis.       She had EGD in 2021 with no evidence of esophageal  varices.       Her previously reported ascites may have been extravasation of urine from a perforated bladder.     FibroScan in November 2022 with liver stiffne   Postpartum state 09/18/2023   Pregnancy 05/12/2017   Previous cesarean delivery, delivered: arrest of dilation  11/18/2017   Rectal bleeding 10/29/2023   Vaginal Pap smear, abnormal    Vaginitis 09/18/2023   White classification A2 gestational diabetes mellitus (GDM) 10/29/2023   h.o first pregnancy, early 1 hr     Past Surgical History:  Procedure Laterality Date   BIOPSY  05/10/2020   Procedure: BIOPSY;  Surgeon: Ace Holder, MD;  Location: [facility];  Service: Gastroenterology;;   BLADDER REPAIR N/A 05/17/2020   Procedure: EXPLORATORY LAPAROTOMY WITH CYSTORRHAPHY;  Surgeon: Trent Frizzle, MD;  Location: [facility];  Service: Urology;  Laterality: N/A;   CESAREAN SECTION N/A 01/26/2013   Procedure:  Primary Cesarean Section Delivery Baby  Boy @ 0017, Apgars 4/8/9;  Surgeon: Elridge Haller. Avanell Bob, MD;  Location: [facility];  Service: Obstetrics;  Laterality: N/A;   CESAREAN SECTION N/A 11/18/2017   Procedure: CESAREAN SECTION;  Surgeon: Meriam Stamp, MD;  Location: [facility];  Service: Obstetrics;  Laterality: N/A;   COLONOSCOPY  2020   DIAGNOSTIC LAPAROSCOPIC LIVER BIOPSY N/A 05/07/2020   Procedure: DIAGNOSTIC LAPAROSCOPIC;  Surgeon: Candyce Champagne, MD;  Location: [facility];  Service: General;  Laterality: N/A;   DIAGNOSTIC LAPAROSCOPY  08/25/2018   Dr Monroe Antig[place] with Ob/Gyn3/2020   ESOPHAGOGASTRODUODENOSCOPY (EGD) WITH PROPOFOL  N/A 05/10/2020   Procedure: ESOPHAGOGASTRODUODENOSCOPY (EGD) WITH PROPOFOL ;  Surgeon: Ace Holder, MD;  Location: [facility];  Service: Gastroenterology;  Laterality: N/A;   ESOPHAGOGASTRODUODENOSCOPY ENDOSCOPY     IR TRANSCATHETER BX  04/18/2020   IR US  GUIDE VASC ACCESS RIGHT  04/18/2020   IR VENOGRAM HEPATIC W HEMODYNAMIC EVALUATION  04/18/2020   NASAL SINUS SURGERY  1996   WISDOM TOOTH EXTRACTION     Allergies  Allergen Reactions   Contrast Media [Iodinated Contrast Media] Anaphylaxis, Shortness Of Breath and Rash    IV contrast    Nsaids Other (See Comments)    Hx gastritis & gastric perforation    Prednisone  & Diphenhydramine  Other (See Comments)    Don't like to take it   Prednisone      Other reaction(s): Other Don't like to take it      Objective:    Physical Exam Vitals and nursing note reviewed.  Constitutional:      Appearance: Normal appearance.  Cardiovascular:     Rate and Rhythm: Normal rate and regular rhythm.  Pulmonary:     Effort: Pulmonary effort is normal.     Breath sounds: Normal breath sounds.  Musculoskeletal:        General: Normal range of motion.  Skin:    General: Skin is warm and dry.  Neurological:     Mental Status: She is alert.  Psychiatric:        Mood and Affect: Mood normal.        Behavior: Behavior  normal.    BP (!) 131/93 (BP Location: Left Arm, Patient Position: Sitting, Cuff Size: Normal)   Pulse 89   Temp 98 F (36.7 C) (Temporal)   Ht 4\' 11"  (1.499 m)   Wt 115 lb 12.8 oz (52.5 kg)   LMP 09/23/2023 (Approximate)   SpO2 98%   BMI 23.39 kg/m  Wt Readings from Last 3 Encounters:  10/29/23 115 lb 12.8 oz (52.5 kg)  10/17/23 115 lb 6 oz (52.3 kg)  09/18/23 117 lb 6 oz (53.2 kg)      Versa Gore, NP

## 2023-11-04 ENCOUNTER — Encounter (HOSPITAL_BASED_OUTPATIENT_CLINIC_OR_DEPARTMENT_OTHER): Payer: Self-pay | Admitting: Adult Health

## 2023-11-04 ENCOUNTER — Ambulatory Visit (HOSPITAL_BASED_OUTPATIENT_CLINIC_OR_DEPARTMENT_OTHER): Admitting: Adult Health

## 2023-11-04 ENCOUNTER — Ambulatory Visit (HOSPITAL_BASED_OUTPATIENT_CLINIC_OR_DEPARTMENT_OTHER)

## 2023-11-04 VITALS — BP 136/76 | HR 74 | Ht 59.0 in | Wt 118.2 lb

## 2023-11-04 DIAGNOSIS — J309 Allergic rhinitis, unspecified: Secondary | ICD-10-CM | POA: Insufficient documentation

## 2023-11-04 DIAGNOSIS — J45991 Cough variant asthma: Secondary | ICD-10-CM | POA: Insufficient documentation

## 2023-11-04 DIAGNOSIS — J45909 Unspecified asthma, uncomplicated: Secondary | ICD-10-CM

## 2023-11-04 DIAGNOSIS — G47419 Narcolepsy without cataplexy: Secondary | ICD-10-CM | POA: Diagnosis not present

## 2023-11-04 DIAGNOSIS — F17210 Nicotine dependence, cigarettes, uncomplicated: Secondary | ICD-10-CM | POA: Diagnosis not present

## 2023-11-04 MED ORDER — AIRSUPRA 90-80 MCG/ACT IN AERO: 2.0000 | INHALATION_SPRAY | Freq: Four times a day (QID) | RESPIRATORY_TRACT | 3 refills | Status: AC | PRN

## 2023-11-04 NOTE — Patient Instructions (Signed)
 Chest xray today  Change Albuterol  to Airsupra 2 puffs every 4-6 hr as needed for wheezing/cough Begin Claritin 10mg  daily for 1 week and then As needed  drainage  Begin Delsym 2 tsp Twice daily  for cough As needed   Follow up with Dr. Washington Hacker in 3 months and As needed   Please contact office for sooner follow up if symptoms do not improve or worsen or seek emergency care

## 2023-11-04 NOTE — Assessment & Plan Note (Signed)
 Narcolepsy -symptoms seem to be manageable.  Encouraged on healthy sleep regimen.  Nap daily as able. Hold on medications at this time.

## 2023-11-04 NOTE — Assessment & Plan Note (Signed)
 Mild intermittent asthma versus cough variant asthma-previous spirometry normal.  Patient is encouraged on smoking cessation.  Change albuterol  to Airsupra as needed.  Check chest x-ray today. Control for triggers such as chronic rhinitis-and Claritin daily. Delsym as needed.  Plan  Patient Instructions  Chest xray today  Change Albuterol  to Airsupra 2 puffs every 4-6 hr as needed for wheezing/cough Begin Claritin 10mg  daily for 1 week and then As needed  drainage  Begin Delsym 2 tsp Twice daily  for cough As needed   Follow up with Dr. Washington Hacker in 3 months and As needed   Please contact office for sooner follow up if symptoms do not improve or worsen or seek emergency care

## 2023-11-04 NOTE — Progress Notes (Signed)
 @Patient  ID: Kim Pacheco, female    DOB: 1985/09/20, 38 y.o.   MRN: [id number]  Chief Complaint  Patient presents with   Follow-up    Asthma    Referring provider: Aida House, MD  HPI: 38 yo female active smoker followed for Asthma and Narcolepsy  Professional photographer From [place]   TEST/EVENTS :  NPSG 03/2020 >> no OSA or PLM's MSLT 03/2020 >> mean sleep latency 2.2 mins , SOREMs 2/5  Spirometry November 2023 normal  11/04/2023 Follow up : Asthma and Narcolepsy  Patient presents for a follow-up visit.  Last seen November 2023.  Patient has a history of mild asthma.  She is an active smoker.  Says she has cut back to 1 to 2 cigarettes a week.  Rarely uses her albuterol .  Says that she had bronchitis several months ago.  Has difficulty with ongoing cough and nasal congestion.  Has been having some intermittent chest tightness recently seen by primary care.  Felt that could be related to anxiety.  Was started on propranolol, patient says she is just started this, unclear if it is helping or not.  Patient denies any exertional chest pain, palpitations, abdominal pain, nausea vomiting diarrhea or edema.  No orthopnea.Was given Depo Medrol  shot and tessalon .   Patient has a history of unremarkable.  Previous multi sleep latency test in 2021 with SOREMs 2/5.  Patient says overall has some daytime sleepiness but feels it is manageable.  She has been working on weight loss and is currently on phentermine.  She is down 15 pounds.  Patient says she rarely takes a nap.  Feels that she is able to manage symptoms currently.  Has had some trouble with insomnia.  Has been working with her primary care provider on medications to help with this.      Allergies  Allergen Reactions   Contrast Media [Iodinated Contrast Media] Anaphylaxis, Shortness Of Breath and Rash    IV contrast    Nsaids Other (See Comments)    Hx gastritis & gastric perforation    Prednisone  &  Diphenhydramine  Other (See Comments)    Don't like to take it   Prednisone      Other reaction(s): Other Don't like to take it    Immunization History  Administered Date(s) Administered   Influenza, Mdck, Trivalent,PF 6+ MOS(egg free) 05/23/2014   Influenza,inj,Quad PF,6+ Mos 02/28/2013, 06/27/2015, 03/28/2016, 04/09/2018, 03/11/2019, 04/15/2020, 06/06/2021, 03/14/2022   Influenza-Unspecified 05/23/2014, 04/11/2019   PFIZER(Purple Top)SARS-COV-2 Vaccination 08/27/2019, 09/21/2019, 06/21/2020   Tdap 01/28/2013, 09/02/2017    Past Medical History:  Diagnosis Date   Abdominal pain    started in mid abd and spread to left side/on and off/since Jan 2020/ pt states, she has fluid in abd/unknown   Abnormal CT of the abdomen    Abnormal glucose tolerance test (GTT) during pregnancy, antepartum 09/17/2017   Abnormal Pap smear    normal 06/2013   Anal fissure 05/07/2020   Anxiety    ARF (acute renal failure) (HCC) 06/25/2018   Ascites 06/24/2018   Atypical chest pain 03/14/2022   Change in bowel habit 05/07/2020   Change in bowel habits 10/29/2023   Chronic abdominal pain 04/13/2020   Chronic hepatitis (HCC) 09/18/2023   Constipation    hx of   Constipation, chronic 06/24/2018   Depression    suicide attempt in college   Diabetes mellitus without complication (HCC)    gestational, no issues since pregnancy per her report - resolved   Free  intraperitoneal air 05/08/2020   Gestational diabetes    /no meds now - resolved   Gestational diabetes mellitus, class A2 09/09/2017   h.o first pregnancy, early 1 hr     Headache 09/18/2023   Hepatic fibrosis 11/30/2020   In October 2019 she had an ultrasound with elastography that showed hepatic steatosis and estimated F0-F1 fibrosis.      She underwent transjugular liver biopsy in November 2021 with with hepatic venous pressure gradient of 13 indicating portal hypertension, free hepatic venous pressure was 17 mmHg with a wedge of 30 mmHg.    Biopsy showed mildly active chronic hepatitis grade 2/4, stage I-2 fibros   Hepatic steatosis 05/07/2020   Patient with a history of hepatic steatosis on biopsy and fibroscan in the setting of chronic alcohol use.     Reviewed with patient that ongoing alcohol use may contribute to continued fat within the liver which can cause inflammation and progression of scarring.     Reviewed the need for a low fat/cholesterol/carbohydrate diet, increase exercise, moderate weight loss, and discontinuation of alco   Hepatitis    Hep B, managed by Dr. Andriette Keeling   History of cesarean section 05/08/2017   arrest of dilation, likely repeat c/s     History of chicken pox    History of depression 05/07/2020   History of gestational diabetes 05/07/2020   History of recurrent UTI (urinary tract infection) 05/08/2020   History of suicide attempt 2003   Attempt in college   History of urinary retention 04/15/2020   History of UTI    Irregular intermenstrual bleeding 09/18/2023   Menstrual period late 09/18/2023   Metrorrhagia 05/29/2018   Noncompliance 05/24/2021   Patient continues to be noncompliant with medication dosing and laboratory testing despite multiple conversations at previous office visits.     Again I had a frank discussion with the patient regarding:   Pharmacy refill information and  lab results continue to show that she is not taking her vemlidy daily as prescribed.     As we have discussed at multiple prior visits failing to take her veml   Oral hypoglycemic controlled White classification A2 gestational diabetes mellitus (GDM) 11/18/2017   Overweight with body mass index (BMI) 25.0-29.9 09/18/2023   Periumbilical pain 05/07/2020   Pneumoperitoneum 05/07/2020   Portal hypertension (HCC) 12/07/2020   Patient had evidence of portal hypertension on venogram however this may have been in the setting of acute alcohol hepatitis.       Liver biopsy at that time reported F1-F2 fibrosis.       She had EGD  in 2021 with no evidence of esophageal varices.       Her previously reported ascites may have been extravasation of urine from a perforated bladder.     FibroScan in November 2022 with liver stiffne   Postpartum state 09/18/2023   Pregnancy 05/12/2017   Previous cesarean delivery, delivered: arrest of dilation  11/18/2017   Rectal bleeding 10/29/2023   Vaginal Pap smear, abnormal    Vaginitis 09/18/2023   White classification A2 gestational diabetes mellitus (GDM) 10/29/2023   h.o first pregnancy, early 1 hr      Tobacco History: Social History   Tobacco Use  Smoking Status Some Days   Current packs/day: 0.50   Average packs/day: 0.5 packs/day for 21.4 years (10.7 ttl pk-yrs)   Types: Cigarettes   Start date: 2004  Smokeless Tobacco Never  Tobacco Comments   smoked for 8 years, quit in 2013 still  continuing to smoke a cigarette a week    Ready to quit: Not Answered Counseling given: Not Answered Tobacco comments: smoked for 8 years, quit in 2013 still continuing to smoke a cigarette a week    Outpatient Medications Prior to Visit  Medication Sig Dispense Refill   cephALEXin  (KEFLEX ) 500 MG capsule Take 1 capsule po after intercourse to prevent UTI. 30 capsule 2   FLUoxetine  (PROZAC ) 10 MG tablet Take 1 tablet (10 mg total) by mouth daily. 90 tablet 1   PHENTERMINE HCL PO Take by mouth. Pt is unsure of dose, Prescribed by [facility]([address])     propranolol (INDERAL) 10 MG tablet Take 1 tablet (10 mg total) by mouth 3 (three) times daily as needed. For anxiety/increased stress. 30 tablet 1   topiramate (TOPAMAX) 25 MG tablet Take 25 mg by mouth daily.     albuterol  (VENTOLIN  HFA) 108 (90 Base) MCG/ACT inhaler Inhale 2 puffs into the lungs every 6 (six) hours as needed for wheezing or shortness of breath (Cough). 8 g 2   No facility-administered medications prior to visit.     Review of Systems:   Constitutional:   No  weight loss, night sweats,   Fevers, chills, fatigue, or  lassitude.  HEENT:   No headaches,  Difficulty swallowing,  Tooth/dental problems, or  Sore throat,                No sneezing, itching, ear ache, nasal congestion, post nasal drip,   CV:  No chest pain,  Orthopnea, PND, swelling in lower extremities, anasarca, dizziness, palpitations, syncope.   GI  No heartburn, indigestion, abdominal pain, nausea, vomiting, diarrhea, change in bowel habits, loss of appetite, bloody stools.   Resp: No shortness of breath with exertion or at rest.  No excess mucus, no productive cough,  No non-productive cough,  No coughing up of blood.  No change in color of mucus.  No wheezing.  No chest wall deformity  Skin: no rash or lesions.  GU: no dysuria, change in color of urine, no urgency or frequency.  No flank pain, no hematuria   MS:  No joint pain or swelling.  No decreased range of motion.  No back pain.    Physical Exam  BP 136/76   Pulse 74   Ht 4\' 11"  (1.499 m)   Wt 118 lb 3.2 oz (53.6 kg)   LMP 09/23/2023 (Approximate)   SpO2 100%   BMI 23.87 kg/m   GEN: A/Ox3; pleasant , NAD, well nourished    HEENT:  [place]/AT,  EACs-clear, TMs-wnl, NOSE-clear, THROAT-clear, no lesions, no postnasal drip or exudate noted.   NECK:  Supple w/ fair ROM; no JVD; normal carotid impulses w/o bruits; no thyromegaly or nodules palpated; no lymphadenopathy.    RESP  Clear  P & A; w/o, wheezes/ rales/ or rhonchi. no accessory muscle use, no dullness to percussion  CARD:  RRR, no m/r/g, no peripheral edema, pulses intact, no cyanosis or clubbing.  GI:   Soft & nt; nml bowel sounds; no organomegaly or masses detected.   Musco: Warm bil, no deformities or joint swelling noted.   Neuro: alert, no focal deficits noted.    Skin: Warm, no lesions or rashes    Lab Results:  CBC    Component Value Date/Time   WBC 4.8 12/02/2022 0902   RBC 4.00 12/02/2022 0902   HGB 12.5 12/02/2022 0902   HCT 36.6 12/02/2022 0902   PLT 221  12/02/2022  0902   MCV 91.5 12/02/2022 0902   MCH 31.3 12/02/2022 0902   MCHC 34.2 12/02/2022 0902   RDW 12.4 12/02/2022 0902   LYMPHSABS 1.4 12/10/2021 0315   MONOABS 0.7 12/10/2021 0315   EOSABS 0.1 12/10/2021 0315   BASOSABS 0.0 12/10/2021 0315    BMET    Component Value Date/Time   NA 138 12/02/2022 0902   K 3.7 12/02/2022 0902   CL 104 12/02/2022 0902   CO2 26 12/02/2022 0902   GLUCOSE 91 12/02/2022 0902   BUN 14 12/02/2022 0902   CREATININE 0.73 12/02/2022 0902   CALCIUM  9.4 12/02/2022 0902   GFRNONAA >60 12/02/2022 0902   GFRAA >60 06/26/2018 0531    BNP    Component Value Date/Time   BNP 23.4 06/24/2018 1934    ProBNP No results found for: "PROBNP"  Imaging: No results found.        Latest Ref Rng & Units 05/09/2022    8:58 AM  PFT Results  FVC-Pre L 3.00   FVC-Predicted Pre % 99   Pre FEV1/FVC % % 85   FEV1-Pre L 2.55   FEV1-Predicted Pre % 101     No results found for: "NITRICOXIDE"      Assessment & Plan:   Cough variant asthma Mild intermittent asthma versus cough variant asthma-previous spirometry normal.  Patient is encouraged on smoking cessation.  Change albuterol  to Airsupra as needed.  Check chest x-ray today. Control for triggers such as chronic rhinitis-and Claritin daily. Delsym as needed.  Plan  Patient Instructions  Chest xray today  Change Albuterol  to Airsupra 2 puffs every 4-6 hr as needed for wheezing/cough Begin Claritin 10mg  daily for 1 week and then As needed  drainage  Begin Delsym 2 tsp Twice daily  for cough As needed   Follow up with Dr. Washington Hacker in 3 months and As needed   Please contact office for sooner follow up if symptoms do not improve or worsen or seek emergency care      Allergic rhinitis Trial of Claritin daily as needed  Narcolepsy without cataplexy Narcolepsy -symptoms seem to be manageable.  Encouraged on healthy sleep regimen.  Nap daily as able. Hold on medications at this  time.      Roena Clark, NP 11/04/2023

## 2023-11-04 NOTE — Assessment & Plan Note (Signed)
 Trial of Claritin daily as needed

## 2023-11-13 ENCOUNTER — Ambulatory Visit (INDEPENDENT_AMBULATORY_CARE_PROVIDER_SITE_OTHER): Payer: Self-pay | Admitting: Plastic Surgery

## 2023-11-13 VITALS — BP 141/91 | HR 87 | Ht 59.0 in | Wt 118.0 lb

## 2023-11-13 DIAGNOSIS — E65 Localized adiposity: Secondary | ICD-10-CM

## 2023-11-13 NOTE — Progress Notes (Signed)
 Referring Provider Versa Gore, NP [address] [place],  [place] [address]   CC:  Chief Complaint  Patient presents with   Advice Only      Kim Pacheco is an 38 y.o. female.  HPI: Kim Pacheco is a 38 year old female who presents today for discussion of anesthetic abdominoplasty.  The patient has been on phentermine for a year and has had a weight loss of approximately 22 pounds.  She is unhappy with the appearance of her anterior abdominal wall especially the protrusion.  She would like to have aesthetic contouring of the abdomen.  She denies any other specific complaints associated with her abdomen.  She is also interested in bariatric surgery to help her sustain her weight loss.  I discussed with her that I do not do the surgery and that she would need to have a referral from her primary care physician to a bariatric surgeon.  I also told her that I did not believe that they would perform any type of bariatric surgery since her BMI is currently 24.  Allergies  Allergen Reactions   Contrast Media [Iodinated Contrast Media] Anaphylaxis, Shortness Of Breath and Rash    IV contrast    Nsaids Other (See Comments)    Hx gastritis & gastric perforation    Prednisone  & Diphenhydramine  Other (See Comments)    Don't like to take it   Prednisone      Other reaction(s): Other Don't like to take it    Outpatient Encounter Medications as of 11/13/2023  Medication Sig   Albuterol -Budesonide (AIRSUPRA ) 90-80 MCG/ACT AERO Inhale 2 Inhalations into the lungs every 6 (six) hours as needed.   cephALEXin  (KEFLEX ) 500 MG capsule Take 1 capsule po after intercourse to prevent UTI.   FLUoxetine  (PROZAC ) 10 MG tablet Take 1 tablet (10 mg total) by mouth daily.   PHENTERMINE HCL PO Take by mouth. Pt is unsure of dose, Prescribed by [facility]([address])   propranolol  (INDERAL ) 10 MG tablet Take 1 tablet (10 mg total) by mouth 3 (three) times daily as  needed. For anxiety/increased stress.   topiramate (TOPAMAX) 25 MG tablet Take 25 mg by mouth daily.   No facility-administered encounter medications on file as of 11/13/2023.     Past Medical History:  Diagnosis Date   Abdominal pain    started in mid abd and spread to left side/on and off/since Jan 2020/ pt states, she has fluid in abd/unknown   Abnormal CT of the abdomen    Abnormal glucose tolerance test (GTT) during pregnancy, antepartum 09/17/2017   Abnormal Pap smear    normal 06/2013   Anal fissure 05/07/2020   Anxiety    ARF (acute renal failure) (HCC) 06/25/2018   Ascites 06/24/2018   Atypical chest pain 03/14/2022   Change in bowel habit 05/07/2020   Change in bowel habits 10/29/2023   Chronic abdominal pain 04/13/2020   Chronic hepatitis (HCC) 09/18/2023   Constipation    hx of   Constipation, chronic 06/24/2018   Depression    suicide attempt in college   Diabetes mellitus without complication (HCC)    gestational, no issues since pregnancy per her report - resolved   Free intraperitoneal air 05/08/2020   Gestational diabetes    /no meds now - resolved   Gestational diabetes mellitus, class A2 09/09/2017   h.o first pregnancy, early 1 hr     Headache 09/18/2023   Hepatic fibrosis 11/30/2020   In October 2019 she had an  ultrasound with elastography that showed hepatic steatosis and estimated F0-F1 fibrosis.      She underwent transjugular liver biopsy in November 2021 with with hepatic venous pressure gradient of 13 indicating portal hypertension, free hepatic venous pressure was 17 mmHg with a wedge of 30 mmHg.   Biopsy showed mildly active chronic hepatitis grade 2/4, stage I-2 fibros   Hepatic steatosis 05/07/2020   Patient with a history of hepatic steatosis on biopsy and fibroscan in the setting of chronic alcohol use.     Reviewed with patient that ongoing alcohol use may contribute to continued fat within the liver which can cause inflammation and progression of  scarring.     Reviewed the need for a low fat/cholesterol/carbohydrate diet, increase exercise, moderate weight loss, and discontinuation of alco   Hepatitis    Hep B, managed by Dr. Andriette Keeling   History of cesarean section 05/08/2017   arrest of dilation, likely repeat c/s     History of chicken pox    History of depression 05/07/2020   History of gestational diabetes 05/07/2020   History of recurrent UTI (urinary tract infection) 05/08/2020   History of suicide attempt 2003   Attempt in college   History of urinary retention 04/15/2020   History of UTI    Irregular intermenstrual bleeding 09/18/2023   Menstrual period late 09/18/2023   Metrorrhagia 05/29/2018   Noncompliance 05/24/2021   Patient continues to be noncompliant with medication dosing and laboratory testing despite multiple conversations at previous office visits.     Again I had a frank discussion with the patient regarding:   Pharmacy refill information and  lab results continue to show that she is not taking her vemlidy daily as prescribed.     As we have discussed at multiple prior visits failing to take her veml   Oral hypoglycemic controlled White classification A2 gestational diabetes mellitus (GDM) 11/18/2017   Overweight with body mass index (BMI) 25.0-29.9 09/18/2023   Periumbilical pain 05/07/2020   Pneumoperitoneum 05/07/2020   Portal hypertension (HCC) 12/07/2020   Patient had evidence of portal hypertension on venogram however this may have been in the setting of acute alcohol hepatitis.       Liver biopsy at that time reported F1-F2 fibrosis.       She had EGD in 2021 with no evidence of esophageal varices.       Her previously reported ascites may have been extravasation of urine from a perforated bladder.     FibroScan in November 2022 with liver stiffne   Postpartum state 09/18/2023   Pregnancy 05/12/2017   Previous cesarean delivery, delivered: arrest of dilation  11/18/2017   Rectal bleeding 10/29/2023    Vaginal Pap smear, abnormal    Vaginitis 09/18/2023   White classification A2 gestational diabetes mellitus (GDM) 10/29/2023   h.o first pregnancy, early 1 hr      Past Surgical History:  Procedure Laterality Date   BIOPSY  05/10/2020   Procedure: BIOPSY;  Surgeon: Ace Holder, MD;  Location: [facility];  Service: Gastroenterology;;   BLADDER REPAIR N/A 05/17/2020   Procedure: EXPLORATORY LAPAROTOMY WITH CYSTORRHAPHY;  Surgeon: Trent Frizzle, MD;  Location: [facility];  Service: Urology;  Laterality: N/A;   CESAREAN SECTION N/A 01/26/2013   Procedure: Primary Cesarean Section Delivery Baby  Boy @ 0017, Apgars 4/8/9;  Surgeon: Elridge Haller. Avanell Bob, MD;  Location: [facility];  Service: Obstetrics;  Laterality: N/A;   CESAREAN SECTION N/A 11/18/2017   Procedure: CESAREAN SECTION;  Surgeon:  Meriam Stamp, MD;  Location: [facility];  Service: Obstetrics;  Laterality: N/A;   COLONOSCOPY  2020   DIAGNOSTIC LAPAROSCOPIC LIVER BIOPSY N/A 05/07/2020   Procedure: DIAGNOSTIC LAPAROSCOPIC;  Surgeon: Candyce Champagne, MD;  Location: [facility];  Service: General;  Laterality: N/A;   DIAGNOSTIC LAPAROSCOPY  08/25/2018   Dr Monroe Antig[place] with Ob/Gyn3/2020   ESOPHAGOGASTRODUODENOSCOPY (EGD) WITH PROPOFOL  N/A 05/10/2020   Procedure: ESOPHAGOGASTRODUODENOSCOPY (EGD) WITH PROPOFOL ;  Surgeon: Ace Holder, MD;  Location: [facility];  Service: Gastroenterology;  Laterality: N/A;   ESOPHAGOGASTRODUODENOSCOPY ENDOSCOPY     IR TRANSCATHETER BX  04/18/2020   IR US  GUIDE VASC ACCESS RIGHT  04/18/2020   IR VENOGRAM HEPATIC W HEMODYNAMIC EVALUATION  04/18/2020   NASAL SINUS SURGERY  1996   WISDOM TOOTH EXTRACTION      Family History  Adopted: Yes  Family history unknown: Yes    Social History   Social History Narrative   Work or School: dog show - Glass blower/designer Situation: lives with husband, son born in 01/2013      Spiritual Beliefs: none      Lifestyle: no regular exercise; diet is  fair - [place] diet      ADOPTED: believes biologic family from [place].  FHx unknown     Review of Systems General: Denies fevers, chills, weight loss CV: Denies chest pain, shortness of breath, palpitations Abdomen: Protuberance of the abdomen with a very small amount of excess skin and fat on the lower portion of the abdomen.  Physical Exam    11/13/2023   10:00 AM 11/04/2023    1:34 PM 10/29/2023    4:13 PM  Vitals with BMI  Height 4\' 11"  4\' 11"  4\' 11"   Weight 118 lbs 118 lbs 3 oz 115 lbs 13 oz  BMI 23.82 23.86 23.38  Systolic 141 136 811  Diastolic 91 76 93  Pulse 87 74 89    General:  No acute distress,  Alert and oriented, Non-Toxic, Normal speech and affect Abdomen: As noted above the patient does have a moderately protuberant abdomen with a minimal pannus.  She does not have any indication of infection.  There is a small rectus diastases of approximately 1.5 cm.  I cannot palpate any additional hernias. Mammogram: Not applicable due to age Assessment/Plan Abdominal contouring: Patient is requesting abdominal contouring specifically because of the protuberance of her abdomen.  I discussed abdominoplasty's at length with her and showed her where the incisions would be and the components of abdominoplasty including a rectus plication and umbilical transposition and a lower abdominal lipectomy.  I told her that there would be some improvement in her abdominal protuberance but that her abdomen would not be flat.  We discussed the use of drains and compression postoperatively.  We discussed the postoperative limitations including no heavy lifting greater than 20 pounds no vigorous activity and no submerging incisions in water  for 6 weeks.  She will be encouraged to ambulate immediately after surgery to help decrease the risk of DVT.  She understands that this is a cosmetic procedure and will not be by insurance.  All questions were answered to her satisfaction.  Photographs were obtained  today with her consent.  She is not sure that she wants to proceed with surgery at this time.  Will provide her with a quote and she will let me know how she would like to proceed  After she left and noticed that she is a cigarette smoker.  She will need to be for 6 weeks prior to proceeding with surgery.  Teretha Ferguson 11/13/2023, 10:54 AM

## 2023-11-16 ENCOUNTER — Other Ambulatory Visit: Payer: Self-pay | Admitting: Family

## 2023-11-16 DIAGNOSIS — F411 Generalized anxiety disorder: Secondary | ICD-10-CM

## 2023-12-23 ENCOUNTER — Ambulatory Visit (HOSPITAL_BASED_OUTPATIENT_CLINIC_OR_DEPARTMENT_OTHER)

## 2023-12-23 DIAGNOSIS — J45909 Unspecified asthma, uncomplicated: Secondary | ICD-10-CM

## 2023-12-26 ENCOUNTER — Ambulatory Visit: Payer: Self-pay | Admitting: Adult Health

## 2024-02-05 ENCOUNTER — Encounter (HOSPITAL_BASED_OUTPATIENT_CLINIC_OR_DEPARTMENT_OTHER): Payer: Self-pay | Admitting: Adult Health

## 2024-02-05 ENCOUNTER — Ambulatory Visit (INDEPENDENT_AMBULATORY_CARE_PROVIDER_SITE_OTHER): Admitting: Adult Health

## 2024-02-05 VITALS — BP 110/75 | HR 84 | Ht 59.0 in | Wt 120.0 lb

## 2024-02-05 DIAGNOSIS — J4520 Mild intermittent asthma, uncomplicated: Secondary | ICD-10-CM | POA: Diagnosis not present

## 2024-02-05 DIAGNOSIS — J309 Allergic rhinitis, unspecified: Secondary | ICD-10-CM | POA: Diagnosis not present

## 2024-02-05 DIAGNOSIS — G4710 Hypersomnia, unspecified: Secondary | ICD-10-CM | POA: Diagnosis not present

## 2024-02-05 NOTE — Patient Instructions (Addendum)
 Airsupra  2 puffs every 4-6 hr as needed for wheezing/cough Claritin 10mg  daily for 1 week and then As needed  drainage  Work on not smoking.  Follow up with Dr. Jude  in 1 year and As needed

## 2024-02-05 NOTE — Progress Notes (Signed)
 @Patient  ID: Kim Pacheco, female    DOB: June 28, 1985, 38 y.o.   MRN: [id number]  Chief Complaint  Patient presents with   Follow-up    Referring provider: Lucius Krabbe, NP  HPI: 38 year old female active smoker followed for asthma and Hypersomnia  Professional photographer From [place]  TEST/EVENTS :  NPSG 03/2020 >> no OSA or PLM's MSLT 03/2020 >> mean sleep latency 2.2 mins , SOREMs 2/5   Spirometry November 2023 normal  Chest x-ray July 2025 showed clear lungs  02/05/2024 Follow up ; Asthma and Hypersomnia  Discussed the use of AI scribe software for clinical note transcription with the patient, who gave verbal consent to proceed.  History of Present Illness Kim Pacheco is a 38 year old female with asthma who presents for 3 months follow-up of her respiratory symptoms.  She experienced a recent exacerbation of asthma symptoms, including cough and dyspnea. She transitioned from using albuterol  to [employer], which has been effective in improving her respiratory status.  Currently not have any symptoms.  Denies any cough or wheezing.  Says she is very active and feels well.  She is an active smoker, having resumed smoking after a period of cessation.  We discussed smoking cessation.  She experiences some daytime sleepiness,  A previous sleep study ruled out sleep apnea, but she has been noted to have daytime hypersomnia.  Says overall it is manageable.  Occasionally has to take a nap.  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     Allergies  Allergen Reactions   Contrast Media [Iodinated Contrast Media] Anaphylaxis, Shortness Of Breath and Rash    IV contrast    Nsaids Other (See Comments)    Hx gastritis & gastric perforation    Prednisone  & Diphenhydramine  Other (See Comments)    Don't like to take it   Prednisone      Other reaction(s): Other Don't like to take it     Immunization History  Administered Date(s) Administered   Influenza, Mdck, Trivalent,PF 6+ MOS(egg free) 05/23/2014   Influenza,inj,Quad PF,6+ Mos 02/28/2013, 06/27/2015, 03/28/2016, 04/09/2018, 03/11/2019, 04/15/2020, 06/06/2021, 03/14/2022   Influenza-Unspecified 05/23/2014, 04/11/2019   PFIZER(Purple Top)SARS-COV-2 Vaccination 08/27/2019, 09/21/2019, 06/21/2020   Tdap 01/28/2013, 09/02/2017    Past Medical History:  Diagnosis Date   Abdominal pain    started in mid abd and spread to left side/on and off/since Jan 2020/ pt states, she has fluid in abd/unknown   Abnormal CT of the abdomen    Abnormal glucose tolerance test (GTT) during pregnancy, antepartum 09/17/2017   Abnormal Pap smear    normal 06/2013   Anal fissure 05/07/2020   Anxiety    ARF (acute renal failure) (HCC) 06/25/2018   Ascites 06/24/2018   Atypical chest pain 03/14/2022   Change in bowel habit 05/07/2020   Change in bowel habits 10/29/2023   Chronic abdominal pain 04/13/2020   Chronic hepatitis (HCC) 09/18/2023   Constipation    hx of   Constipation, chronic 06/24/2018   Depression    suicide attempt in college   Diabetes mellitus without complication (HCC)    gestational, no issues since pregnancy per her report - resolved   Free intraperitoneal air 05/08/2020   Gestational diabetes    /no meds now - resolved   Gestational diabetes mellitus, class A2 09/09/2017   h.o first pregnancy, early 1 hr     Headache 09/18/2023   Hepatic fibrosis  11/30/2020   In October 2019 she had an ultrasound with elastography that showed hepatic steatosis and estimated F0-F1 fibrosis.      She underwent transjugular liver biopsy in November 2021 with with hepatic venous pressure gradient of 13 indicating portal hypertension, free hepatic venous pressure was 17 mmHg with a wedge of 30 mmHg.   Biopsy showed mildly active chronic hepatitis grade 2/4, stage I-2 fibros   Hepatic steatosis 05/07/2020   Patient with a history  of hepatic steatosis on biopsy and fibroscan in the setting of chronic alcohol use.     Reviewed with patient that ongoing alcohol use may contribute to continued fat within the liver which can cause inflammation and progression of scarring.     Reviewed the need for a low fat/cholesterol/carbohydrate diet, increase exercise, moderate weight loss, and discontinuation of alco   Hepatitis    Hep B, managed by Dr. Luis   History of cesarean section 05/08/2017   arrest of dilation, likely repeat c/s     History of chicken pox    History of depression 05/07/2020   History of gestational diabetes 05/07/2020   History of recurrent UTI (urinary tract infection) 05/08/2020   History of suicide attempt 2003   Attempt in college   History of urinary retention 04/15/2020   History of UTI    Irregular intermenstrual bleeding 09/18/2023   Menstrual period late 09/18/2023   Metrorrhagia 05/29/2018   Noncompliance 05/24/2021   Patient continues to be noncompliant with medication dosing and laboratory testing despite multiple conversations at previous office visits.     Again I had a frank discussion with the patient regarding:   Pharmacy refill information and  lab results continue to show that she is not taking her vemlidy daily as prescribed.     As we have discussed at multiple prior visits failing to take her veml   Oral hypoglycemic controlled White classification A2 gestational diabetes mellitus (GDM) 11/18/2017   Overweight with body mass index (BMI) 25.0-29.9 09/18/2023   Periumbilical pain 05/07/2020   Pneumoperitoneum 05/07/2020   Portal hypertension (HCC) 12/07/2020   Patient had evidence of portal hypertension on venogram however this may have been in the setting of acute alcohol hepatitis.       Liver biopsy at that time reported F1-F2 fibrosis.       She had EGD in 2021 with no evidence of esophageal varices.       Her previously reported ascites may have been extravasation of urine from a  perforated bladder.     FibroScan in November 2022 with liver stiffne   Postpartum state 09/18/2023   Pregnancy 05/12/2017   Previous cesarean delivery, delivered: arrest of dilation  11/18/2017   Rectal bleeding 10/29/2023   Vaginal Pap smear, abnormal    Vaginitis 09/18/2023   White classification A2 gestational diabetes mellitus (GDM) 10/29/2023   h.o first pregnancy, early 1 hr      Tobacco History: Social History   Tobacco Use  Smoking Status Some Days   Current packs/day: 0.50   Average packs/day: 0.5 packs/day for 21.7 years (10.8 ttl pk-yrs)   Types: Cigarettes   Start date: 2004  Smokeless Tobacco Never  Tobacco Comments   smoked for 8 years, quit in 2013 still continuing to smoke a cigarette a week    Ready to quit: Not Answered Counseling given: Not Answered Tobacco comments: smoked for 8 years, quit in 2013 still continuing to smoke a cigarette a week    Outpatient Medications  Prior to Visit  Medication Sig Dispense Refill   Albuterol -Budesonide (AIRSUPRA ) 90-80 MCG/ACT AERO Inhale 2 Inhalations into the lungs every 6 (six) hours as needed. 1 g 3   cephALEXin  (KEFLEX ) 500 MG capsule Take 1 capsule po after intercourse to prevent UTI. 30 capsule 2   FLUoxetine  (PROZAC ) 10 MG tablet Take 1 tablet (10 mg total) by mouth daily. 90 tablet 1   PHENTERMINE HCL PO Take by mouth. Pt is unsure of dose, Prescribed by [facility]([address])     propranolol  (INDERAL ) 10 MG tablet Take 1 tablet (10 mg total) by mouth 3 (three) times daily as needed. For anxiety/increased stress. 30 tablet 1   topiramate (TOPAMAX) 25 MG tablet Take 25 mg by mouth daily.     No facility-administered medications prior to visit.     Review of Systems:   Constitutional:   No  weight loss, night sweats,  Fevers, chills, fatigue, or  lassitude.  HEENT:   No headaches,  Difficulty swallowing,  Tooth/dental problems, or  Sore throat,                No sneezing, itching, ear ache,  nasal congestion, post nasal drip,   CV:  No chest pain,  Orthopnea, PND, swelling in lower extremities, anasarca, dizziness, palpitations, syncope.   GI  No heartburn, indigestion, abdominal pain, nausea, vomiting, diarrhea, change in bowel habits, loss of appetite, bloody stools.   Resp: No shortness of breath with exertion or at rest.  No excess mucus, no productive cough,  No non-productive cough,  No coughing up of blood.  No change in color of mucus.  No wheezing.  No chest wall deformity  Skin: no rash or lesions.  GU: no dysuria, change in color of urine, no urgency or frequency.  No flank pain, no hematuria   MS:  No joint pain or swelling.  No decreased range of motion.  No back pain.    Physical Exam  BP 110/75 (BP Location: Left Arm, Patient Position: Sitting, Cuff Size: Normal)   Pulse 84   Ht 4' 11 (1.499 m)   Wt 120 lb (54.4 kg)   SpO2 98%   BMI 24.24 kg/m   GEN: A/Ox3; pleasant , NAD, well nourished    HEENT:  [place]/AT,   NOSE-clear, THROAT-clear, no lesions, no postnasal drip or exudate noted.   NECK:  Supple w/ fair ROM; no JVD; normal carotid impulses w/o bruits; no thyromegaly or nodules palpated; no lymphadenopathy.    RESP  Clear  P & A; w/o, wheezes/ rales/ or rhonchi. no accessory muscle use, no dullness to percussion  CARD:  RRR, no m/r/g, no peripheral edema, pulses intact, no cyanosis or clubbing.  GI:   Soft & nt; nml bowel sounds; no organomegaly or masses detected.   Musco: Warm bil, no deformities or joint swelling noted.   Neuro: alert, no focal deficits noted.    Skin: Warm, no lesions or rashes    Lab Results:  CBC    Component Value Date/Time   WBC 4.8 12/02/2022 0902   RBC 4.00 12/02/2022 0902   HGB 12.5 12/02/2022 0902   HCT 36.6 12/02/2022 0902   PLT 221 12/02/2022 0902   MCV 91.5 12/02/2022 0902   MCH 31.3 12/02/2022 0902   MCHC 34.2 12/02/2022 0902   RDW 12.4 12/02/2022 0902   LYMPHSABS 1.4 12/10/2021 0315   MONOABS  0.7 12/10/2021 0315   EOSABS 0.1 12/10/2021 0315   BASOSABS 0.0 12/10/2021 0315  BMET    Component Value Date/Time   NA 138 12/02/2022 0902   K 3.7 12/02/2022 0902   CL 104 12/02/2022 0902   CO2 26 12/02/2022 0902   GLUCOSE 91 12/02/2022 0902   BUN 14 12/02/2022 0902   CREATININE 0.73 12/02/2022 0902   CALCIUM  9.4 12/02/2022 0902   GFRNONAA >60 12/02/2022 0902   GFRAA >60 06/26/2018 0531    BNP    Component Value Date/Time   BNP 23.4 06/24/2018 1934    ProBNP No results found for: PROBNP  Imaging: No results found.  Administration History     None          Latest Ref Rng & Units 05/09/2022    8:58 AM  PFT Results  FVC-Pre L 3.00   FVC-Predicted Pre % 99   Pre FEV1/FVC % % 85   FEV1-Pre L 2.55   FEV1-Predicted Pre % 101     No results found for: NITRICOXIDE      Assessment & Plan:   No problem-specific Assessment & Plan notes found for this encounter.  Assessment and Plan Assessment & Plan Asthma  -Mild intermittent  Asthma remains well-controlled. Use Air Supra As needed  . Her previous flare-up with cough and breathing difficulty has improved, and the chest x-ray from the last visit was normal. Fall allergies may worsen asthma symptoms. Curator as needed. Recommend Claritin or Flonase  for allergy management. Encourage staying active.  Allergic Rhinitis -Claritin and Flonase  As needed     Tobacco use   She has resumed smoking, which can aggravate asthma symptoms. Emphasized the importance of quitting smoking for respiratory health. Advise smoking cessation and document smoking status.  Daytime hypersomnia   Daytime hypersomnia persists, Previous sleep study ruled out sleep apnea. Advise taking naps during the day if needed.  Plan Patient Instructions  Airsupra  2 puffs every 4-6 hr as needed for wheezing/cough Claritin 10mg  daily for 1 week and then As needed  drainage  Work on not smoking.  Follow up with Dr. Jude  in 1  year and As needed          Madelin Stank, NP 02/05/2024

## 2024-02-18 DIAGNOSIS — J018 Other acute sinusitis: Secondary | ICD-10-CM | POA: Diagnosis not present

## 2024-02-18 DIAGNOSIS — B9689 Other specified bacterial agents as the cause of diseases classified elsewhere: Secondary | ICD-10-CM | POA: Diagnosis not present

## 2024-02-25 ENCOUNTER — Ambulatory Visit: Admitting: Family

## 2024-02-26 ENCOUNTER — Ambulatory Visit: Admitting: Family

## 2024-02-26 ENCOUNTER — Encounter: Payer: Self-pay | Admitting: Family

## 2024-02-26 VITALS — BP 104/70 | HR 74 | Temp 97.5°F | Ht 59.0 in | Wt 120.5 lb

## 2024-02-26 DIAGNOSIS — E639 Nutritional deficiency, unspecified: Secondary | ICD-10-CM | POA: Diagnosis not present

## 2024-02-26 DIAGNOSIS — Z23 Encounter for immunization: Secondary | ICD-10-CM

## 2024-02-26 DIAGNOSIS — L987 Excessive and redundant skin and subcutaneous tissue: Secondary | ICD-10-CM | POA: Diagnosis not present

## 2024-02-26 DIAGNOSIS — R971 Elevated cancer antigen 125 [CA 125]: Secondary | ICD-10-CM

## 2024-02-26 DIAGNOSIS — N390 Urinary tract infection, site not specified: Secondary | ICD-10-CM

## 2024-02-26 MED ORDER — CEPHALEXIN 500 MG PO CAPS
ORAL_CAPSULE | ORAL | Status: AC
Start: 2024-02-26 — End: ?

## 2024-02-26 NOTE — Progress Notes (Signed)
 Patient ID: Kim Pacheco, female    DOB: May 19, 1986, 38 y.o.   MRN: [id number]  Chief Complaint  Patient presents with   Bloated    Pt c/o bloating, present for 6 years. Has tried decreasing alcohol intake, diet change and exercise.    Hypertension  Discussed the use of AI scribe software for clinical note transcription with the patient, who gave verbal consent to proceed.  History of Present Illness   Kim Pacheco is a 38 year old female who presents with concerns about abdominal bloating and excess skin.  Abdominal excess skin - Described as abdominal bloating with excess skin present for approximately six years - No associated abdominal pain or discomfort - Abdominal bloating persists despite regular exercise and reduced alcohol consumption - Recently evaluated by a plastic surgeon; declined surgery due to uncertain outcomes  History of bladder perforation - Bladder perforation occurred three years ago - Initial diagnosis delayed due to misattribution of abdominal size  Recurrent urinary tract infections - History of urinary tract infections - Uses Cefalexin as needed for urinary tract infections  Recent bacterial ear infection - Recently treated with antibiotics for a bacterial ear infection     Assessment and Plan    Abdominal wall laxity after weight loss Abdominal wall laxity persists post-weight loss. Surgery not recommended by plastic surgeon due to risks and uncertain outcomes. - Refer to physical therapy for abdominal muscle strengthening at [facility] location. - Advised on dietary modifications include reducing sweets and white carbohydrates. - Refer to nutritionist for dietary counseling for muscle building and weight management.  Recurrent urinary tract infections Recurrent UTIs managed with Keflex  prophylaxis. No acute symptoms. - Continue Keflex  as needed for UTI prophylaxis.      Subjective:    Outpatient  Medications Prior to Visit  Medication Sig Dispense Refill   Albuterol -Budesonide (AIRSUPRA ) 90-80 MCG/ACT AERO Inhale 2 Inhalations into the lungs every 6 (six) hours as needed. 1 g 3   cephALEXin  (KEFLEX ) 500 MG capsule Take 1 capsule po after intercourse to prevent UTI. 30 capsule 2   FLUoxetine  (PROZAC ) 10 MG tablet Take 1 tablet (10 mg total) by mouth daily. 90 tablet 1   PHENTERMINE HCL PO Take by mouth. Pt is unsure of dose, Prescribed by [facility]([address])     propranolol  (INDERAL ) 10 MG tablet Take 1 tablet (10 mg total) by mouth 3 (three) times daily as needed. For anxiety/increased stress. 30 tablet 1   topiramate (TOPAMAX) 25 MG tablet Take 25 mg by mouth daily.     No facility-administered medications prior to visit.   Past Medical History:  Diagnosis Date   Abdominal pain    started in mid abd and spread to left side/on and off/since Jan 2020/ pt states, she has fluid in abd/unknown   Abnormal CT of the abdomen    Abnormal glucose tolerance test (GTT) during pregnancy, antepartum 09/17/2017   Abnormal Pap smear    normal 06/2013   Anal fissure 05/07/2020   Anxiety    ARF (acute renal failure) (HCC) 06/25/2018   Ascites 06/24/2018   Atypical chest pain 03/14/2022   Cellulitis of breast 05/29/2018   Change in bowel habit 05/07/2020   Change in bowel habits 10/29/2023   Chronic abdominal pain 04/13/2020   Chronic hepatitis (HCC) 09/18/2023   Constipation    hx of   Constipation, chronic 06/24/2018   Depression    suicide attempt in college   Diabetes mellitus without  complication (HCC)    gestational, no issues since pregnancy per her report - resolved   Free intraperitoneal air 05/08/2020   Gestational diabetes    /no meds now - resolved   Gestational diabetes mellitus, class A2 09/09/2017   h.o first pregnancy, early 1 hr     Headache 09/18/2023   Hepatic fibrosis 11/30/2020   In October 2019 she had an ultrasound with elastography that  showed hepatic steatosis and estimated F0-F1 fibrosis.      She underwent transjugular liver biopsy in November 2021 with with hepatic venous pressure gradient of 13 indicating portal hypertension, free hepatic venous pressure was 17 mmHg with a wedge of 30 mmHg.   Biopsy showed mildly active chronic hepatitis grade 2/4, stage I-2 fibros   Hepatic steatosis 05/07/2020   Patient with a history of hepatic steatosis on biopsy and fibroscan in the setting of chronic alcohol use.     Reviewed with patient that ongoing alcohol use may contribute to continued fat within the liver which can cause inflammation and progression of scarring.     Reviewed the need for a low fat/cholesterol/carbohydrate diet, increase exercise, moderate weight loss, and discontinuation of alco   Hepatitis    Hep B, managed by Dr. Luis   History of cesarean section 05/08/2017   arrest of dilation, likely repeat c/s     History of chicken pox    History of depression 05/07/2020   History of gestational diabetes 05/07/2020   History of recurrent UTI (urinary tract infection) 05/08/2020   History of suicide attempt 2003   Attempt in college   History of urinary retention 04/15/2020   History of UTI    Irregular intermenstrual bleeding 09/18/2023   Menstrual period late 09/18/2023   Metrorrhagia 05/29/2018   Noncompliance 05/24/2021   Patient continues to be noncompliant with medication dosing and laboratory testing despite multiple conversations at previous office visits.     Again I had a frank discussion with the patient regarding:   Pharmacy refill information and  lab results continue to show that she is not taking her vemlidy daily as prescribed.     As we have discussed at multiple prior visits failing to take her veml   Oral hypoglycemic controlled White classification A2 gestational diabetes mellitus (GDM) 11/18/2017   Overweight with body mass index (BMI) 25.0-29.9 09/18/2023   Periumbilical pain 05/07/2020    Pneumoperitoneum 05/07/2020   Portal hypertension (HCC) 12/07/2020   Patient had evidence of portal hypertension on venogram however this may have been in the setting of acute alcohol hepatitis.       Liver biopsy at that time reported F1-F2 fibrosis.       She had EGD in 2021 with no evidence of esophageal varices.       Her previously reported ascites may have been extravasation of urine from a perforated bladder.     FibroScan in November 2022 with liver stiffne   Postpartum state 09/18/2023   Pregnancy 05/12/2017   Previous cesarean delivery, delivered: arrest of dilation  11/18/2017   Rectal bleeding 10/29/2023   Vaginal Pap smear, abnormal    Vaginitis 09/18/2023   White classification A2 gestational diabetes mellitus (GDM) 10/29/2023   h.o first pregnancy, early 1 hr     Past Surgical History:  Procedure Laterality Date   BIOPSY  05/10/2020   Procedure: BIOPSY;  Surgeon: Leigh Elspeth SQUIBB, MD;  Location: [facility];  Service: Gastroenterology;;   BLADDER REPAIR N/A 05/17/2020   Procedure: EXPLORATORY  LAPAROTOMY WITH CYSTORRHAPHY;  Surgeon: Matilda Senior, MD;  Location: [facility];  Service: Urology;  Laterality: N/A;   CESAREAN SECTION N/A 01/26/2013   Procedure: Primary Cesarean Section Delivery Baby  Boy @ 0017, Apgars 4/8/9;  Surgeon: Marjorie DEL. Okey, MD;  Location: [facility];  Service: Obstetrics;  Laterality: N/A;   CESAREAN SECTION N/A 11/18/2017   Procedure: CESAREAN SECTION;  Surgeon: Gorge Ade, MD;  Location: [facility];  Service: Obstetrics;  Laterality: N/A;   COLONOSCOPY  2020   DIAGNOSTIC LAPAROSCOPIC LIVER BIOPSY N/A 05/07/2020   Procedure: DIAGNOSTIC LAPAROSCOPIC;  Surgeon: Sheldon Standing, MD;  Location: [facility];  Service: General;  Laterality: N/A;   DIAGNOSTIC LAPAROSCOPY  08/25/2018   Dr Arlee with Ob/Gyn3/2020   ESOPHAGOGASTRODUODENOSCOPY (EGD) WITH PROPOFOL  N/A 05/10/2020   Procedure: ESOPHAGOGASTRODUODENOSCOPY (EGD) WITH PROPOFOL ;  Surgeon:  Leigh Standing SQUIBB, MD;  Location: [facility];  Service: Gastroenterology;  Laterality: N/A;   ESOPHAGOGASTRODUODENOSCOPY ENDOSCOPY     IR TRANSCATHETER BX  04/18/2020   IR US  GUIDE VASC ACCESS RIGHT  04/18/2020   IR VENOGRAM HEPATIC W HEMODYNAMIC EVALUATION  04/18/2020   NASAL SINUS SURGERY  1996   WISDOM TOOTH EXTRACTION     Allergies  Allergen Reactions   Contrast Media [Iodinated Contrast Media] Anaphylaxis, Shortness Of Breath and Rash    IV contrast    Nsaids Other (See Comments)    Hx gastritis & gastric perforation    Prednisone  & Diphenhydramine  Other (See Comments)    Don't like to take it   Prednisone      Other reaction(s): Other Don't like to take it      Objective:    Physical Exam Vitals and nursing note reviewed.  Constitutional:      Appearance: Normal appearance.  Cardiovascular:     Rate and Rhythm: Normal rate and regular rhythm.  Pulmonary:     Effort: Pulmonary effort is normal.     Breath sounds: Normal breath sounds.  Abdominal:     General: Abdomen is protuberant. There is no distension.  Musculoskeletal:        General: Normal range of motion.  Skin:    General: Skin is warm and dry.  Neurological:     Mental Status: She is alert.  Psychiatric:        Mood and Affect: Mood normal.        Behavior: Behavior normal.    BP 104/70 (BP Location: Left Arm, Patient Position: Sitting)   Pulse 74   Temp (!) 97.5 F (36.4 C) (Temporal)   Ht 4' 11 (1.499 m)   Wt 120 lb 8 oz (54.7 kg)   LMP  (LMP Unknown)   SpO2 98%   BMI 24.34 kg/m  Wt Readings from Last 3 Encounters:  02/26/24 120 lb 8 oz (54.7 kg)  02/05/24 120 lb (54.4 kg)  11/13/23 118 lb (53.5 kg)      Lucius Krabbe, NP

## 2024-03-08 ENCOUNTER — Other Ambulatory Visit: Payer: Self-pay

## 2024-03-08 ENCOUNTER — Ambulatory Visit: Admitting: Physical Therapy

## 2024-03-08 ENCOUNTER — Encounter: Payer: Self-pay | Admitting: Physical Therapy

## 2024-03-08 DIAGNOSIS — M6281 Muscle weakness (generalized): Secondary | ICD-10-CM | POA: Diagnosis not present

## 2024-03-08 NOTE — Patient Instructions (Signed)
 Access Code: [email or web address] URL: [email or web address] Date: 03/08/2024 Prepared by: Elaine Daring  Exercises - Supine 90/90 with Leg Extensions  - 3-4 x weekly - 2 sets - 20 reps - Supine Bridge  - 3-4 x weekly - 2 sets - 10 reps - 5 seconds hold - Side Plank on Knees  - 3-4 x weekly - 3 sets - 20 seconds hold - Standard Plank  - 3-4 x weekly - 3 sets - 20 seconds hold - Bird Dog  - 3-4 x weekly - 2 sets - 10 reps - 2 seconds hold

## 2024-03-08 NOTE — Therapy (Signed)
 OUTPATIENT PHYSICAL THERAPY EVALUATION   Patient Name: Kim Pacheco MRN: [id number] DOB:[birth year], 38 y.o., female Today's Date: 03/08/2024   END OF SESSION:  PT End of Session - 03/08/24 0927     Visit Number 1    Number of Visits 4    Date for Recertification  04/19/24    Authorization Type BCBS    PT Start Time [phone]    PT Stop Time 0928    PT Time Calculation (min) 42 min    Activity Tolerance Patient tolerated treatment well    Behavior During Therapy [facility] for tasks assessed/performed          Past Medical History:  Diagnosis Date   Abdominal pain    started in mid abd and spread to left side/on and off/since Jan 2020/ pt states, she has fluid in abd/unknown   Abnormal CT of the abdomen    Abnormal glucose tolerance test (GTT) during pregnancy, antepartum 09/17/2017   Abnormal Pap smear    normal 06/2013   Anal fissure 05/07/2020   Anxiety    ARF (acute renal failure) 06/25/2018   Ascites 06/24/2018   Atypical chest pain 03/14/2022   Cellulitis of breast 05/29/2018   Change in bowel habit 05/07/2020   Change in bowel habits 10/29/2023   Chronic abdominal pain 04/13/2020   Chronic hepatitis (HCC) 09/18/2023   Constipation    hx of   Constipation, chronic 06/24/2018   Depression    suicide attempt in college   Diabetes mellitus without complication (HCC)    gestational, no issues since pregnancy per her report - resolved   Free intraperitoneal air 05/08/2020   Gestational diabetes    /no meds now - resolved   Gestational diabetes mellitus, class A2 09/09/2017   h.o first pregnancy, early 1 hr     Headache 09/18/2023   Hepatic fibrosis 11/30/2020   In October 2019 she had an ultrasound with elastography that showed hepatic steatosis and estimated F0-F1 fibrosis.      She underwent transjugular liver biopsy in November 2021 with with hepatic venous pressure gradient of 13 indicating portal hypertension, free hepatic venous pressure was 17 mmHg with a  wedge of 30 mmHg.   Biopsy showed mildly active chronic hepatitis grade 2/4, stage I-2 fibros   Hepatic steatosis 05/07/2020   Patient with a history of hepatic steatosis on biopsy and fibroscan in the setting of chronic alcohol use.     Reviewed with patient that ongoing alcohol use may contribute to continued fat within the liver which can cause inflammation and progression of scarring.     Reviewed the need for a low fat/cholesterol/carbohydrate diet, increase exercise, moderate weight loss, and discontinuation of alco   Hepatitis    Hep B, managed by Dr. Luis   History of cesarean section 05/08/2017   arrest of dilation, likely repeat c/s     History of chicken pox    History of depression 05/07/2020   History of gestational diabetes 05/07/2020   History of recurrent UTI (urinary tract infection) 05/08/2020   History of suicide attempt 2003   Attempt in college   History of urinary retention 04/15/2020   History of UTI    Irregular intermenstrual bleeding 09/18/2023   Menstrual period late 09/18/2023   Metrorrhagia 05/29/2018   Noncompliance 05/24/2021   Patient continues to be noncompliant with medication dosing and laboratory testing despite multiple conversations at previous office visits.     Again I had a frank discussion with the patient  regarding:   Pharmacy refill information and  lab results continue to show that she is not taking her vemlidy daily as prescribed.     As we have discussed at multiple prior visits failing to take her veml   Oral hypoglycemic controlled White classification A2 gestational diabetes mellitus (GDM) 11/18/2017   Overweight with body mass index (BMI) 25.0-29.9 09/18/2023   Periumbilical pain 05/07/2020   Pneumoperitoneum 05/07/2020   Portal hypertension (HCC) 12/07/2020   Patient had evidence of portal hypertension on venogram however this may have been in the setting of acute alcohol hepatitis.       Liver biopsy at that time reported F1-F2 fibrosis.        She had EGD in 2021 with no evidence of esophageal varices.       Her previously reported ascites may have been extravasation of urine from a perforated bladder.     FibroScan in November 2022 with liver stiffne   Postpartum state 09/18/2023   Pregnancy 05/12/2017   Previous cesarean delivery, delivered: arrest of dilation  11/18/2017   Rectal bleeding 10/29/2023   Vaginal Pap smear, abnormal    Vaginitis 09/18/2023   White classification A2 gestational diabetes mellitus (GDM) 10/29/2023   h.o first pregnancy, early 1 hr     Past Surgical History:  Procedure Laterality Date   BIOPSY  05/10/2020   Procedure: BIOPSY;  Surgeon: Leigh Elspeth SQUIBB, MD;  Location: [facility];  Service: Gastroenterology;;   BLADDER REPAIR N/A 05/17/2020   Procedure: EXPLORATORY LAPAROTOMY WITH CYSTORRHAPHY;  Surgeon: Matilda Senior, MD;  Location: [facility];  Service: Urology;  Laterality: N/A;   CESAREAN SECTION N/A 01/26/2013   Procedure: Primary Cesarean Section Delivery Baby  Boy @ 0017, Apgars 4/8/9;  Surgeon: Marjorie DEL. Okey, MD;  Location: [facility];  Service: Obstetrics;  Laterality: N/A;   CESAREAN SECTION N/A 11/18/2017   Procedure: CESAREAN SECTION;  Surgeon: Gorge Ade, MD;  Location: [facility];  Service: Obstetrics;  Laterality: N/A;   COLONOSCOPY  2020   DIAGNOSTIC LAPAROSCOPIC LIVER BIOPSY N/A 05/07/2020   Procedure: DIAGNOSTIC LAPAROSCOPIC;  Surgeon: Sheldon Elspeth, MD;  Location: [facility];  Service: General;  Laterality: N/A;   DIAGNOSTIC LAPAROSCOPY  08/25/2018   Dr Arlee with Ob/Gyn3/2020   ESOPHAGOGASTRODUODENOSCOPY (EGD) WITH PROPOFOL  N/A 05/10/2020   Procedure: ESOPHAGOGASTRODUODENOSCOPY (EGD) WITH PROPOFOL ;  Surgeon: Leigh Elspeth SQUIBB, MD;  Location: [facility];  Service: Gastroenterology;  Laterality: N/A;   ESOPHAGOGASTRODUODENOSCOPY ENDOSCOPY     IR TRANSCATHETER BX  04/18/2020   IR US  GUIDE VASC ACCESS RIGHT  04/18/2020   IR VENOGRAM HEPATIC W HEMODYNAMIC  EVALUATION  04/18/2020   NASAL SINUS SURGERY  1996   WISDOM TOOTH EXTRACTION     Patient Active Problem List   Diagnosis Date Noted   Cough variant asthma 11/04/2023   Allergic rhinitis 11/04/2023   Generalized anxiety disorder 10/29/2023   Elevated blood pressure reading in office without diagnosis of hypertension 10/29/2023   Cervical radiculopathy 05/06/2023   Extraperitoneal bladder perforation 05/17/2020   Anxiety    Hepatic steatosis 05/07/2020   Narcolepsy without cataplexy 02/02/2020   Depression, major, single episode, moderate (HCC) 10/28/2018   Elevated CA-125 08/06/2018   Alcohol abuse 06/24/2018   Fibrocystic disease of breast 05/29/2018   Chronic type B viral hepatitis (HCC) 06/14/2014    PCP: Lucius Krabbe, NP  REFERRING PROVIDER: Lucius Krabbe, NP  REFERRING DIAG: Excess skin of abdomen  Rationale for Evaluation and Treatment: Rehabilitation  THERAPY DIAG:  Muscle weakness (  generalized)  ONSET DATE: Chronic for approximately 2-3 years (referral date 02/26/2024)   SUBJECTIVE:          SUBJECTIVE STATEMENT: Patient reports she has been working out for the past 3-4 years, eating better, and cutting back on drinking, but is still dealing with belly fat. About 2 years ago she ended up with a hole in her bladder and she ended up having surgery. She states she is not a fan of cardio exercise, sometimes if she overworks too much it feels like a pressure on her chest. She denies any pain with her exercises or working out. She has met with a nutritionist and she uses an app for helping he decide what to eat. She also uses an app for her exercise routine.   PERTINENT HISTORY:  See PMH above  PAIN:  Are you having pain? No  PRECAUTIONS: None  RED FLAGS: None   WEIGHT BEARING RESTRICTIONS: No  FALLS:  Has patient fallen in last 6 months? No  PLOF: Independent  PATIENT GOALS: Improve core and abdominal strength to reduce belly fat and ability to  exercise   OBJECTIVE:  Note: Objective measures were completed at Evaluation unless otherwise noted. PATIENT SURVEYS:  PSFS: 1.3 Crunches: 1 Abdominal exercises: 0 Core: 3  COGNITION: Overall cognitive status: Within functional limits for tasks assessed     SENSATION: WFL  POSTURE: WFL  LOWER EXTREMITY MMT:    MMT Right eval Left eval  Hip flexion 4 4  Hip extension 4- 4-  Hip abduction 4- 4-  Hip adduction    Hip internal rotation    Hip external rotation    Knee flexion    Knee extension    Ankle dorsiflexion    Ankle plantarflexion    Ankle inversion    Ankle eversion     (Blank rows = not tested)  FUNCTIONAL TESTS:  90-90 hold: 30+ sec with report of difficulty DLLT: 45 deg Side plank hold: left 11 sec, right 11 sec Front plank hold: 22 seconds Bridge: 30+ seconds  TREATMENT  [facility] PT Treatment:                                                DATE: 03/08/2024 90-90 alternating leg extension 2 x 20 Bridge 2 x 10 x 5 sec Modified side plank on knees 2 x 20 sec each Front plank 2 x 20 sec Bird dog 2 x 20  Discussed methods for burning belly fat consisting of various forms of exercise like weight lifting, cardio, core and abdominal exercises; need for proper diet to burn fat combined with exercise and possibility of patient using dietician and/or exercise trainer; discussed her exercise app she uses at the gym  PATIENT EDUCATION:  Education details: Exam findings, POC, HEP Person educated: Patient Education method: Explanation, Demonstration, Tactile cues, Verbal cues, and Handouts Education comprehension: verbalized understanding, returned demonstration, verbal cues required, tactile cues required, and needs further education  HOME EXERCISE PROGRAM: Access Code: [email or web address]    ASSESSMENT: CLINICAL IMPRESSION: Patient is a 38 y.o. female who was seen today for physical therapy evaluation and treatment for core and abdominal strength deficit that  seems to be affecting her ability to exercise and loose weight appropriately following bladder surgery multiple years ago. She does exhibit gross strength deficit of her core and abdominal region, and she was  provided exercises to initiate strengthening and educated on proper exercises combined with diet to maximize her weight loss and body composition.   OBJECTIVE IMPAIRMENTS: decreased activity tolerance and decreased strength.   ACTIVITY LIMITATIONS: carrying and lifting  PARTICIPATION LIMITATIONS: community activity  PERSONAL FACTORS: Fitness, Past/current experiences, and Time since onset of injury/illness/exacerbation are also affecting patient's functional outcome.   REHAB POTENTIAL: Good  CLINICAL DECISION MAKING: Stable/uncomplicated  EVALUATION COMPLEXITY: Low   GOALS: Goals reviewed with patient? Yes  SHORT TERM GOALS: Target date: = LTG  LONG TERM GOALS: Target date: 04/19/2024  Patient will be I with final HEP to maintain progress from PT. Baseline: HEP provided at eval Goal status: INITIAL  2.  Patient will report PSFS >/= 8 in order to indicate improvement in their functional ability. Baseline: 1.3 Goal status: INITIAL  3.  Patient will demonstrate DLLT </= 30 deg in order to indicate improvement in her core and abdominal strength and control to reduce difficulty with her exercise routine Baseline: 45 deg Goal status: INITIAL  4.  Patient will be able to maintain front and side plank holds >/= 30 dec in order to indicate improved endurance of core musculature and reduce functional limitations with exercises Baseline: side plank 11 sec, front plank 22 sec Goal status: INITIAL   PLAN: PT FREQUENCY: every other week  PT DURATION: 6 weeks  PLANNED INTERVENTIONS: 97164- PT Re-evaluation, 97750- Physical Performance Testing, 97110-Therapeutic exercises, 97530- Therapeutic activity, [id number]- Neuromuscular re-education, 97535- Self Care, [id number]- Manual therapy, and  Patient/Family education.  PLAN FOR NEXT SESSION: Review HEP and progress PRN, focus on progress core and abdominal strengthening and control, continued education of exercise for fat reduction    Elaine Daring, PT, DPT, LAT, ATC 03/08/24  11:20 AM Phone: [phone] Fax: [phone]

## 2024-03-10 ENCOUNTER — Other Ambulatory Visit (HOSPITAL_COMMUNITY)
Admission: RE | Admit: 2024-03-10 | Discharge: 2024-03-10 | Disposition: A | Discharge status: Home / Self Care | Source: Ambulatory Visit | Attending: Family | Admitting: Family

## 2024-03-10 ENCOUNTER — Ambulatory Visit: Admitting: Family

## 2024-03-10 ENCOUNTER — Encounter: Payer: Self-pay | Admitting: Family

## 2024-03-10 VITALS — BP 102/59 | HR 78 | Temp 97.9°F | Ht 59.0 in | Wt 122.4 lb

## 2024-03-10 DIAGNOSIS — Z1231 Encounter for screening mammogram for malignant neoplasm of breast: Secondary | ICD-10-CM

## 2024-03-10 DIAGNOSIS — F419 Anxiety disorder, unspecified: Secondary | ICD-10-CM | POA: Diagnosis not present

## 2024-03-10 DIAGNOSIS — Z23 Encounter for immunization: Secondary | ICD-10-CM | POA: Diagnosis not present

## 2024-03-10 DIAGNOSIS — N898 Other specified noninflammatory disorders of vagina: Secondary | ICD-10-CM | POA: Insufficient documentation

## 2024-03-10 DIAGNOSIS — R350 Frequency of micturition: Secondary | ICD-10-CM

## 2024-03-10 DIAGNOSIS — F321 Major depressive disorder, single episode, moderate: Secondary | ICD-10-CM

## 2024-03-10 LAB — POCT URINALYSIS DIPSTICK
Bilirubin, UA: NEGATIVE
Blood, UA: NEGATIVE
Glucose, UA: NEGATIVE
Ketones, UA: NEGATIVE
Leukocytes, UA: NEGATIVE
Nitrite, UA: POSITIVE — AB
Protein, UA: NEGATIVE
Spec Grav, UA: 1.015 (ref 1.010–1.025)
Urobilinogen, UA: 0.2 U/dL
pH, UA: 6 (ref 5.0–8.0)

## 2024-03-10 MED ORDER — FLUOXETINE HCL 10 MG PO TABS
10.0000 mg | ORAL_TABLET | Freq: Every day | ORAL | 1 refills | Status: AC
Start: 2024-03-10 — End: ?

## 2024-03-10 NOTE — Addendum Note (Signed)
 Addended by: Roseland Braun on: 03/10/2024 05:10 PM   Modules accepted: Level of Service

## 2024-03-10 NOTE — Assessment & Plan Note (Signed)
 Taking Fluoxetine  (Prozac ) 10mg  daily. Working well, denies any side effects. - Refill Fluoxetine  10mg  qd - F/U in 6 mos or prn

## 2024-03-10 NOTE — Progress Notes (Addendum)
 Patient ID: Kim Pacheco, female    DOB: December 13, 1985, 38 y.o.   MRN: [id number]  Chief Complaint  Patient presents with   Vaginal Pain    Pt c/o tenderness, itchiness and pain during sexual intercourse. Has tried celebrex, which did not help. Pt denies discharge.    Anxiety  Discussed the use of AI scribe software for clinical note transcription with the patient, who gave verbal consent to proceed.  History of Present Illness   Kim Pacheco is a 38 year old female who presents with vaginal pain and itchiness.  She experiences persistent vaginal pain and itchiness, with tenderness during or after intercourse. There is no change in discharge or dysuria, no foul odor with urine, hematuria or pelvic pain. She has experienced yeast infections, bacterial infections, and UTIs in the past, but the current symptoms differ from those previous infections.     Assessment and Plan    Vaginal pain and pruritus Persistent vaginal itchiness and tenderness post-coitus with no discharge. Differential includes yeast infection or atrophic changes. Started menses today. Taking Keflex  with every postcoital exposure. - Perform cervical vaginal swab for yeast infection. - Advise use of over-the-counter lubricant during intercourse.  Clogged facial pore Tender clogged pore on face not forming a head despite warm compresses. - Recommend over-the-counter benzoyl peroxide product, Neutrogena 'on the spot'. - Advise moisturizing over benzoyl peroxide to prevent dryness as needed.  Urinary tract infection, rule out Urine test positive for nitrites. No dysuria. - Perform cervical vaginal swab to rule out other infections. - Provide results through MyChart.  Anxiety & depression Taking Fluoxetine  (Prozac ) 10mg  daily. Working well, denies any side effects. - Refill Fluoxetine  10mg  qd - F/U in 6 mos or prn  Need for vaccine - Flu vaccine trivalent PF, 6mos and  older(Flulaval,Afluria,Fluarix,Fluzone)   Subjective:    Outpatient Medications Prior to Visit  Medication Sig Dispense Refill   Albuterol -Budesonide (AIRSUPRA ) 90-80 MCG/ACT AERO Inhale 2 Inhalations into the lungs every 6 (six) hours as needed. 1 g 3   cephALEXin  (KEFLEX ) 500 MG capsule Take 1 capsule po after intercourse to prevent UTI.     PHENTERMINE HCL PO Take by mouth. Pt is unsure of dose, Prescribed by [facility]([address])     propranolol  (INDERAL ) 10 MG tablet Take 1 tablet (10 mg total) by mouth 3 (three) times daily as needed. For anxiety/increased stress. 30 tablet 1   topiramate (TOPAMAX) 25 MG tablet Take 25 mg by mouth daily.     FLUoxetine  (PROZAC ) 10 MG tablet Take 1 tablet (10 mg total) by mouth daily. 90 tablet 1   No facility-administered medications prior to visit.   Past Medical History:  Diagnosis Date   Abdominal pain    started in mid abd and spread to left side/on and off/since Jan 2020/ pt states, she has fluid in abd/unknown   Abnormal CT of the abdomen    Abnormal glucose tolerance test (GTT) during pregnancy, antepartum 09/17/2017   Abnormal Pap smear    normal 06/2013   Anal fissure 05/07/2020   Anxiety    ARF (acute renal failure) 06/25/2018   Ascites 06/24/2018   Atypical chest pain 03/14/2022   Cellulitis of breast 05/29/2018   Change in bowel habit 05/07/2020   Change in bowel habits 10/29/2023   Chronic abdominal pain 04/13/2020   Chronic hepatitis (HCC) 09/18/2023   Constipation    hx of   Constipation, chronic 06/24/2018   Depression    suicide attempt  in college   Diabetes mellitus without complication (HCC)    gestational, no issues since pregnancy per her report - resolved   Free intraperitoneal air 05/08/2020   Gestational diabetes    /no meds now - resolved   Gestational diabetes mellitus, class A2 09/09/2017   h.o first pregnancy, early 1 hr     Headache 09/18/2023   Hepatic fibrosis 11/30/2020   In  October 2019 she had an ultrasound with elastography that showed hepatic steatosis and estimated F0-F1 fibrosis.      She underwent transjugular liver biopsy in November 2021 with with hepatic venous pressure gradient of 13 indicating portal hypertension, free hepatic venous pressure was 17 mmHg with a wedge of 30 mmHg.   Biopsy showed mildly active chronic hepatitis grade 2/4, stage I-2 fibros   Hepatic steatosis 05/07/2020   Patient with a history of hepatic steatosis on biopsy and fibroscan in the setting of chronic alcohol use.     Reviewed with patient that ongoing alcohol use may contribute to continued fat within the liver which can cause inflammation and progression of scarring.     Reviewed the need for a low fat/cholesterol/carbohydrate diet, increase exercise, moderate weight loss, and discontinuation of alco   Hepatitis    Hep B, managed by Dr. Luis   History of cesarean section 05/08/2017   arrest of dilation, likely repeat c/s     History of chicken pox    History of depression 05/07/2020   History of gestational diabetes 05/07/2020   History of recurrent UTI (urinary tract infection) 05/08/2020   History of suicide attempt 2003   Attempt in college   History of urinary retention 04/15/2020   History of UTI    Irregular intermenstrual bleeding 09/18/2023   Menstrual period late 09/18/2023   Metrorrhagia 05/29/2018   Noncompliance 05/24/2021   Patient continues to be noncompliant with medication dosing and laboratory testing despite multiple conversations at previous office visits.     Again I had a frank discussion with the patient regarding:   Pharmacy refill information and  lab results continue to show that she is not taking her vemlidy daily as prescribed.     As we have discussed at multiple prior visits failing to take her veml   Oral hypoglycemic controlled White classification A2 gestational diabetes mellitus (GDM) 11/18/2017   Overweight with body mass index (BMI)  25.0-29.9 09/18/2023   Periumbilical pain 05/07/2020   Pneumoperitoneum 05/07/2020   Portal hypertension (HCC) 12/07/2020   Patient had evidence of portal hypertension on venogram however this may have been in the setting of acute alcohol hepatitis.       Liver biopsy at that time reported F1-F2 fibrosis.       She had EGD in 2021 with no evidence of esophageal varices.       Her previously reported ascites may have been extravasation of urine from a perforated bladder.     FibroScan in November 2022 with liver stiffne   Postpartum state 09/18/2023   Pregnancy 05/12/2017   Previous cesarean delivery, delivered: arrest of dilation  11/18/2017   Rectal bleeding 10/29/2023   Vaginal Pap smear, abnormal    Vaginitis 09/18/2023   White classification A2 gestational diabetes mellitus (GDM) 10/29/2023   h.o first pregnancy, early 1 hr     Past Surgical History:  Procedure Laterality Date   BIOPSY  05/10/2020   Procedure: BIOPSY;  Surgeon: Leigh Elspeth SQUIBB, MD;  Location: [facility];  Service: Gastroenterology;;   BLADDER  REPAIR N/A 05/17/2020   Procedure: EXPLORATORY LAPAROTOMY WITH CYSTORRHAPHY;  Surgeon: Matilda Senior, MD;  Location: [facility];  Service: Urology;  Laterality: N/A;   CESAREAN SECTION N/A 01/26/2013   Procedure: Primary Cesarean Section Delivery Baby  Boy @ 0017, Apgars 4/8/9;  Surgeon: Marjorie DEL. Okey, MD;  Location: [facility];  Service: Obstetrics;  Laterality: N/A;   CESAREAN SECTION N/A 11/18/2017   Procedure: CESAREAN SECTION;  Surgeon: Gorge Ade, MD;  Location: [facility];  Service: Obstetrics;  Laterality: N/A;   COLONOSCOPY  2020   DIAGNOSTIC LAPAROSCOPIC LIVER BIOPSY N/A 05/07/2020   Procedure: DIAGNOSTIC LAPAROSCOPIC;  Surgeon: Sheldon Standing, MD;  Location: [facility];  Service: General;  Laterality: N/A;   DIAGNOSTIC LAPAROSCOPY  08/25/2018   Dr Arlee with Ob/Gyn3/2020   ESOPHAGOGASTRODUODENOSCOPY (EGD) WITH PROPOFOL  N/A 05/10/2020   Procedure:  ESOPHAGOGASTRODUODENOSCOPY (EGD) WITH PROPOFOL ;  Surgeon: Leigh Standing SQUIBB, MD;  Location: [facility];  Service: Gastroenterology;  Laterality: N/A;   ESOPHAGOGASTRODUODENOSCOPY ENDOSCOPY     IR TRANSCATHETER BX  04/18/2020   IR US  GUIDE VASC ACCESS RIGHT  04/18/2020   IR VENOGRAM HEPATIC W HEMODYNAMIC EVALUATION  04/18/2020   NASAL SINUS SURGERY  1996   WISDOM TOOTH EXTRACTION     Allergies  Allergen Reactions   Contrast Media [Iodinated Contrast Media] Anaphylaxis, Shortness Of Breath and Rash    IV contrast    Nsaids Other (See Comments)    Hx gastritis & gastric perforation    Prednisone  & Diphenhydramine  Other (See Comments)    Don't like to take it   Prednisone      Other reaction(s): Other Don't like to take it      Objective:    Physical Exam Vitals and nursing note reviewed.  Constitutional:      Appearance: Normal appearance.  Cardiovascular:     Rate and Rhythm: Normal rate and regular rhythm.  Pulmonary:     Effort: Pulmonary effort is normal.     Breath sounds: Normal breath sounds.  Musculoskeletal:        General: Normal range of motion.  Skin:    General: Skin is warm and dry.     Findings: Acne (left lower cheek, red, no head, approx 0.5cm raised) and lesion present.  Neurological:     Mental Status: She is alert.  Psychiatric:        Mood and Affect: Mood normal.        Behavior: Behavior normal.    BP (!) 102/59 (BP Location: Left Arm, Patient Position: Sitting, Cuff Size: Normal)   Pulse 78   Temp 97.9 F (36.6 C) (Temporal)   Ht 4' 11 (1.499 m)   Wt 122 lb 6.4 oz (55.5 kg)   LMP 02/04/2024 (Exact Date)   SpO2 99%   BMI 24.72 kg/m  Wt Readings from Last 3 Encounters:  03/10/24 122 lb 6.4 oz (55.5 kg)  02/26/24 120 lb 8 oz (54.7 kg)  02/05/24 120 lb (54.4 kg)      Lucius Krabbe, NP

## 2024-03-12 LAB — CERVICOVAGINAL ANCILLARY ONLY
Bacterial Vaginitis (gardnerella): POSITIVE — AB
Candida Glabrata: NEGATIVE
Candida Vaginitis: POSITIVE — AB
Chlamydia: NEGATIVE
Comment: NEGATIVE
Comment: NEGATIVE
Comment: NEGATIVE
Comment: NEGATIVE
Comment: NEGATIVE
Comment: NORMAL
Neisseria Gonorrhea: NEGATIVE
Trichomonas: NEGATIVE

## 2024-03-15 ENCOUNTER — Ambulatory Visit: Payer: Self-pay | Admitting: Family

## 2024-03-15 DIAGNOSIS — N760 Acute vaginitis: Secondary | ICD-10-CM

## 2024-03-15 MED ORDER — FLUCONAZOLE 150 MG PO TABS
ORAL_TABLET | ORAL | 0 refills | Status: AC
Start: 2024-03-15 — End: ?

## 2024-03-15 MED ORDER — METRONIDAZOLE 500 MG PO TABS: 500.0000 mg | ORAL_TABLET | Freq: Three times a day (TID) | ORAL | 0 refills | Status: AC

## 2024-03-18 ENCOUNTER — Emergency Department (HOSPITAL_BASED_OUTPATIENT_CLINIC_OR_DEPARTMENT_OTHER)
Admission: EM | Admit: 2024-03-18 | Discharge: 2024-03-18 | Disposition: A | Discharge status: Home / Self Care | Attending: Emergency Medicine | Admitting: Emergency Medicine

## 2024-03-18 ENCOUNTER — Encounter (HOSPITAL_BASED_OUTPATIENT_CLINIC_OR_DEPARTMENT_OTHER): Payer: Self-pay

## 2024-03-18 ENCOUNTER — Emergency Department (HOSPITAL_BASED_OUTPATIENT_CLINIC_OR_DEPARTMENT_OTHER)

## 2024-03-18 ENCOUNTER — Other Ambulatory Visit: Payer: Self-pay

## 2024-03-18 ENCOUNTER — Other Ambulatory Visit (HOSPITAL_BASED_OUTPATIENT_CLINIC_OR_DEPARTMENT_OTHER): Payer: Self-pay

## 2024-03-18 DIAGNOSIS — R109 Unspecified abdominal pain: Secondary | ICD-10-CM

## 2024-03-18 DIAGNOSIS — R1010 Upper abdominal pain, unspecified: Secondary | ICD-10-CM | POA: Diagnosis not present

## 2024-03-18 DIAGNOSIS — K8020 Calculus of gallbladder without cholecystitis without obstruction: Secondary | ICD-10-CM | POA: Diagnosis not present

## 2024-03-18 DIAGNOSIS — R7401 Elevation of levels of liver transaminase levels: Secondary | ICD-10-CM | POA: Insufficient documentation

## 2024-03-18 DIAGNOSIS — E119 Type 2 diabetes mellitus without complications: Secondary | ICD-10-CM | POA: Insufficient documentation

## 2024-03-18 DIAGNOSIS — R079 Chest pain, unspecified: Secondary | ICD-10-CM | POA: Insufficient documentation

## 2024-03-18 DIAGNOSIS — R748 Abnormal levels of other serum enzymes: Secondary | ICD-10-CM | POA: Diagnosis not present

## 2024-03-18 DIAGNOSIS — R0789 Other chest pain: Secondary | ICD-10-CM | POA: Insufficient documentation

## 2024-03-18 LAB — COMPREHENSIVE METABOLIC PANEL WITH GFR
ALT: 493 U/L — ABNORMAL HIGH (ref 0–44)
AST: 370 U/L — ABNORMAL HIGH (ref 15–41)
Albumin: 4 g/dL (ref 3.5–5.0)
Alkaline Phosphatase: 56 U/L (ref 38–126)
Anion gap: 11 (ref 5–15)
BUN: 9 mg/dL (ref 6–20)
CO2: 25 mmol/L (ref 22–32)
Calcium: 9.4 mg/dL (ref 8.9–10.3)
Chloride: 102 mmol/L (ref 98–111)
Creatinine, Ser: 0.65 mg/dL (ref 0.44–1.00)
GFR, Estimated: >60 mL/min (ref >60)
Glucose, Bld: 164 mg/dL — ABNORMAL HIGH (ref 70–99)
Potassium: 4 mmol/L (ref 3.5–5.1)
Sodium: 137 mmol/L (ref 135–145)
Total Bilirubin: 0.9 mg/dL (ref 0.0–1.2)
Total Protein: 7.4 g/dL (ref 6.5–8.1)

## 2024-03-18 LAB — CBC WITH DIFFERENTIAL/PLATELET
Abs Immature Granulocytes: 0.01 K/uL (ref 0.00–0.07)
Basophils Absolute: 0 K/uL (ref 0.0–0.1)
Basophils Relative: 1 %
Eosinophils Absolute: 0 K/uL (ref 0.0–0.5)
Eosinophils Relative: 1 %
HCT: 39.9 % (ref 36.0–46.0)
Hemoglobin: 13.7 g/dL (ref 12.0–15.0)
Immature Granulocytes: 0 %
Lymphocytes Relative: 37 %
Lymphs Abs: 1.2 K/uL (ref 0.7–4.0)
MCH: 31.5 pg (ref 26.0–34.0)
MCHC: 34.3 g/dL (ref 30.0–36.0)
MCV: 91.7 fL (ref 80.0–100.0)
Monocytes Absolute: 0.3 K/uL (ref 0.1–1.0)
Monocytes Relative: 10 %
Neutro Abs: 1.7 K/uL (ref 1.7–7.7)
Neutrophils Relative %: 51 %
Platelets: 163 K/uL (ref 150–400)
RBC: 4.35 MIL/uL (ref 3.87–5.11)
RDW: 12.2 % (ref 11.5–15.5)
WBC: 3.4 K/uL — ABNORMAL LOW (ref 4.0–10.5)
nRBC: 0 % (ref 0.0–0.2)

## 2024-03-18 LAB — TROPONIN T, HIGH SENSITIVITY: Troponin T High Sensitivity: <15 ng/L (ref 0–19)

## 2024-03-18 LAB — RESP PANEL BY RT-PCR (RSV, FLU A&B, COVID)  RVPGX2
Influenza A by PCR: NEGATIVE
Influenza B by PCR: NEGATIVE
Resp Syncytial Virus by PCR: NEGATIVE
SARS Coronavirus 2 by RT PCR: NEGATIVE

## 2024-03-18 LAB — PROTIME-INR
INR: 1.1 (ref 0.8–1.2)
Prothrombin Time: 14.9 s (ref 11.4–15.2)

## 2024-03-18 LAB — LIPASE, BLOOD: Lipase: 29 U/L (ref 11–51)

## 2024-03-18 MED ORDER — ONDANSETRON HCL 4 MG/2ML IJ SOLN
4.0000 mg | Freq: Once | INTRAMUSCULAR | Status: AC
  Administered 2024-03-18: 4 mg via INTRAVENOUS
  Filled 2024-03-18: qty 2

## 2024-03-18 MED ORDER — KETOROLAC TROMETHAMINE 15 MG/ML IJ SOLN
15.0000 mg | Freq: Once | INTRAMUSCULAR | Status: AC
  Administered 2024-03-18: 15 mg via INTRAVENOUS
  Filled 2024-03-18: qty 1

## 2024-03-18 MED ORDER — METRONIDAZOLE 0.75 % VA GEL
1.0000 | Freq: Every day | VAGINAL | 0 refills | Status: AC
  Filled 2024-03-18: qty 70, 5d supply, fill #0

## 2024-03-18 NOTE — ED Provider Notes (Signed)
 [place] EMERGENCY DEPARTMENT AT [facility] Provider Note   CSN: [id number] Arrival date & time: 03/18/24  9075     Patient presents with: Medication Reaction   Kim Pacheco is a 38 y.o. female.   Patient here chest pain nausea vomiting after starting Flagyl couple days ago for bacterial vaginosis.  Thinks that she is having side effect with that medicine.  She denies any shortness of breath cough sputum production.  Not really having any vaginal discharge or bleeding.  Not having any major lower abdominal pain.  Pain mostly through the middle of her chest and upper abdomen.  Denies any fever or chills.  No recent surgery or travel.  No blood clot history.  Is not on any birth control.  No history of cancer or immunocompromise state.  History of depression diabetes hepatic fibrosis/chronic hep C used to follow with a liver specialist to be on antivirals but no longer taking those meds of the last year or so.  The history is provided by the patient.       Prior to Admission medications   Medication Sig Start Date End Date Taking? Authorizing Provider  metroNIDAZOLE (METROGEL) 0.75 % vaginal gel Place 1 Applicatorful vaginally at bedtime for 5 days. 03/18/24 03/23/24 Yes Davinia Riccardi, DO  Albuterol -Budesonide (AIRSUPRA ) 90-80 MCG/ACT AERO Inhale 2 Inhalations into the lungs every 6 (six) hours as needed. 11/04/23   Parrett, Madelin RAMAN, NP  cephALEXin  (KEFLEX ) 500 MG capsule Take 1 capsule po after intercourse to prevent UTI. 02/26/24   Lucius Krabbe, NP  fluconazole (DIFLUCAN) 150 MG tablet Take 1 pill today and the 2nd pill in 3 days. 03/15/24   Lucius Krabbe, NP  FLUoxetine  (PROZAC ) 10 MG tablet Take 1 tablet (10 mg total) by mouth daily. 03/10/24   Lucius Krabbe, NP  metroNIDAZOLE (FLAGYL) 500 MG tablet Take 1 tablet (500 mg total) by mouth 3 (three) times daily for 7 days. 03/15/24 03/22/24  Lucius Krabbe, NP  PHENTERMINE HCL PO Take by mouth. Pt is  unsure of dose, Prescribed by [facility]([address])    [provider]  propranolol  (INDERAL ) 10 MG tablet Take 1 tablet (10 mg total) by mouth 3 (three) times daily as needed. For anxiety/increased stress. 10/29/23   Lucius Krabbe, NP  topiramate (TOPAMAX) 25 MG tablet Take 25 mg by mouth daily. 06/26/23   [provider]    Allergies: Contrast media [iodinated contrast media], Nsaids, Prednisone  & diphenhydramine , and Prednisone     Review of Systems  Updated Vital Signs BP 110/61   Pulse (!) 56   Temp 97.8 F (36.6 C) (Oral)   Resp 15   Ht 4' 11 (1.499 m)   Wt 55.5 kg   LMP 02/04/2024 (Exact Date)   SpO2 100%   BMI 24.71 kg/m   Physical Exam Vitals and nursing note reviewed.  Constitutional:      General: She is not in acute distress.    Appearance: She is well-developed. She is not ill-appearing.  HENT:     Head: Normocephalic and atraumatic.     Nose: Nose normal.     Mouth/Throat:     Mouth: Mucous membranes are moist.  Eyes:     Extraocular Movements: Extraocular movements intact.     Conjunctiva/sclera: Conjunctivae normal.     Pupils: Pupils are equal, round, and reactive to light.  Cardiovascular:     Rate and Rhythm: Normal rate and regular rhythm.     Pulses: Normal pulses.  Heart sounds: Normal heart sounds. No murmur heard. Pulmonary:     Effort: Pulmonary effort is normal. No respiratory distress.     Breath sounds: Normal breath sounds.  Abdominal:     Palpations: Abdomen is soft.     Tenderness: There is no abdominal tenderness.  Musculoskeletal:        General: No swelling.     Cervical back: Normal range of motion and neck supple.  Skin:    General: Skin is warm and dry.     Capillary Refill: Capillary refill takes less than 2 seconds.  Neurological:     General: No focal deficit present.     Mental Status: She is alert.  Psychiatric:        Mood and Affect: Mood normal.     (all labs ordered are  listed, but only abnormal results are displayed) Labs Reviewed  CBC WITH DIFFERENTIAL/PLATELET - Abnormal; Notable for the following components:      Result Value   WBC 3.4 (*)    All other components within normal limits  COMPREHENSIVE METABOLIC PANEL WITH GFR - Abnormal; Notable for the following components:   Glucose, Bld 164 (*)    AST 370 (*)    ALT 493 (*)    All other components within normal limits  RESP PANEL BY RT-PCR (RSV, FLU A&B, COVID)  RVPGX2  LIPASE, BLOOD  PROTIME-INR  HEPATITIS PANEL, ACUTE  HEPATITIS B DNA, ULTRAQUANTITATIVE, PCR  TROPONIN T, HIGH SENSITIVITY    EKG: EKG Interpretation Date/Time:  Thursday March 18 2024 09:38:26 EDT Ventricular Rate:  66 PR Interval:  137 QRS Duration:  81 QT Interval:  399 QTC Calculation: 418 R Axis:   60  Text Interpretation: Sinus rhythm Confirmed by Ruthe Cornet [phone]) on 03/18/2024 9:41:41 AM  Radiology: US  Abdomen Limited RUQ (LIVER/GB) Result Date: 03/18/2024 CLINICAL DATA:  Chest and abdominal pain. EXAM: ULTRASOUND ABDOMEN LIMITED RIGHT UPPER QUADRANT COMPARISON:  CT 12/10/2021, ultrasound 09/11/2020, ultrasound 04/15/2020 FINDINGS: Gallbladder: Physiologically distended. Multiple intraluminal gallstones. No gallbladder wall thickening. Rounded echogenic structure likely representing polyp measuring 1.3 cm, is not significantly changed from prior exams. This may represent sludge ball or polyp. No vascularity. No sonographic Murphy sign noted by sonographer. Common bile duct: Diameter: 3 mm, normal. Liver: No focal lesion identified. Heterogeneous parenchymal echogenicity. Portal vein is patent on color Doppler imaging with normal direction of blood flow towards the liver. Other: No right upper quadrant ascites. IMPRESSION: 1. Gallstones without sonographic findings of acute cholecystitis. 2. Stable echogenic focus within the proximal gallbladder measuring 13 mm, unchanged from prior imaging dating back to 2021. This  may represent sludge ball or polyp. Electronically Signed   By: Andrea Gasman M.D.   On: 03/18/2024 12:44   DG Chest Portable 1 View Result Date: 03/18/2024 CLINICAL DATA:  Pain. EXAM: PORTABLE CHEST 1 VIEW COMPARISON:  12/23/2023. FINDINGS: The heart size and mediastinal contours are within normal limits. Both lungs are clear. No pleural effusion or pneumothorax. No acute osseous abnormality. IMPRESSION: No acute cardiopulmonary findings. Electronically Signed   By: Harrietta Sherry M.D.   On: 03/18/2024 10:45     Procedures   Medications Ordered in the ED  ondansetron  (ZOFRAN ) injection 4 mg (4 mg Intravenous Given 03/18/24 1031)  ketorolac  (TORADOL ) 15 MG/ML injection 15 mg (15 mg Intravenous Given 03/18/24 1131)  Medical Decision Making Amount and/or Complexity of Data Reviewed Labs: ordered. Radiology: ordered.  Risk Prescription drug management.   Hadassah POUR Sollecito-Olson is here nausea vomiting chest pain.  Does have a history of chronic hepatitis C no longer following with liver specialist used to be on antivirals.  Unremarkable vitals.  No fever.  She thinks that she is having allergic reaction to the Flagyl.  Does not have any difficulty breathing.  A lot of GI upset since starting the Flagyl.  Has not been drinking alcohol with it.  Overall differential diagnosis could be medicine side effect but will evaluate for ACS pancreatitis hepatobiliary process/chronic hep C process cardiac process.  Have no concern for blood clot.  PERC negative.  Will give IV Zofran  check basic labs including troponin and reevaluate.  Will switch oral Flagyl to vaginal Flagyl.  She is already taken a dose of Diflucan for yeast infection.  Will have her hold off on any further treatment for that.  She took 1 dose couple days ago.  Overall liver enzymes mildly elevated.  AST is 320 ALT is 493.  But lab work is otherwise unremarkable.  Platelets are normal.  INR is  normal.  I do not think there is any active liver failure.  Right upper quadrant ultrasound shows some gallstones but no evidence of cholecystitis.  Maybe some chronic sludge in the gallbladder.  She is not having any pain on reevaluation.  Overall I talked with Greig Corti with GI.  Patient does follow with a liver doctor already outpatient.  But has not seen them in about a year.  She used to be on medicines for chronic hepatitis B treatment.  Overall we will refer her back to her specialist.  She does not need any acute management at this time.  Will switch her over to vaginal metronidazole discontinue oral metronidazole.  Follow-up with primary care doctor and GI to continue to trend her liver enzyme she understands strict return precautions.  Discharge.  Otherwise have no concern for ACS or PE.  Lab work was unremarkable including troponin.  PERC negative.  This chart was dictated using voice recognition software.  Despite best efforts to proofread,  errors can occur which can change the documentation meaning.      Final diagnoses:  Elevated liver enzymes  Nonspecific abdominal pain  Atypical chest pain  Gallstones    ED Discharge Orders          Ordered    metroNIDAZOLE (METROGEL) 0.75 % vaginal gel  Daily at bedtime        03/18/24 0951               Ruthe Cornet, DO 03/18/24 1255

## 2024-03-18 NOTE — Discharge Instructions (Addendum)
 Follow-up with your gastroenterologist and your primary care doctor to continue to follow your liver enzymes that are elevated.  It is important to see your gastroenterologist to talk about your chronic hepatitis B.  I have switched your metronidazole over to the vaginal gel form, discontinue your oral metronidazole.  Turn if symptoms worsen.  Discussed gallstones with your primary care doctor as well.  Return if you develop worsening abdominal pain nausea vomiting diarrhea fever.

## 2024-03-18 NOTE — ED Triage Notes (Signed)
 Arrives POV with complaints of developing a medication reaction. Patient reports that she was started on antibiotics and Fluconazole for a bacterial (vaginal) infection 2 days ago and started having abdominal pain, n/v, headache, and chest pain.

## 2024-03-18 NOTE — ED Notes (Signed)
 Pt given discharge instructions and reviewed prescriptions. Opportunities given for questions. Pt verbalizes understanding. PIV removed x1. Bethena Powell SAUNDERS, RN

## 2024-03-19 LAB — HBV REAL-TIME PCR, QUANT
HBV AS IU/ML: 97800000 [IU]/mL
LOG10 HBV AS IU/ML: 7.99 {Log_IU}/mL

## 2024-03-19 LAB — HEPATITIS PANEL, ACUTE: Hep A IgM: NEGATIVE — AB

## 2024-03-19 LAB — HEPATITIS B DNA, ULTRAQUANTITATIVE, PCR

## 2024-03-19 LAB — HCV INTERPRETATION

## 2024-03-19 LAB — HEPATITIS B SURFACE AG, CONFIRM: HBsAG Confirmation: POSITIVE — AB

## 2024-03-22 ENCOUNTER — Encounter: Admitting: Physical Therapy

## 2024-03-23 MED ORDER — TINIDAZOLE 500 MG PO TABS
1000.0000 mg | ORAL_TABLET | Freq: Every day | ORAL | 0 refills | Status: AC
  Filled 2024-03-23: qty 10, 5d supply, fill #0

## 2024-03-23 MED ORDER — CLOTRIMAZOLE 1 % VA CREA
1.0000 | TOPICAL_CREAM | Freq: Every day | VAGINAL | 0 refills | Status: AC
  Filled 2024-03-23 – 2024-03-24 (×2): qty 45, 5d supply, fill #0

## 2024-03-23 NOTE — Telephone Encounter (Signed)
 sent new meds to her pharmacy, thx.

## 2024-03-24 ENCOUNTER — Other Ambulatory Visit (HOSPITAL_BASED_OUTPATIENT_CLINIC_OR_DEPARTMENT_OTHER): Payer: Self-pay

## 2024-03-29 ENCOUNTER — Ambulatory Visit: Payer: Self-pay

## 2024-03-29 ENCOUNTER — Encounter: Payer: Self-pay | Admitting: Family

## 2024-03-29 ENCOUNTER — Ambulatory Visit: Admitting: Family

## 2024-03-29 VITALS — BP 112/60 | HR 71 | Temp 97.3°F | Ht 59.0 in | Wt 122.0 lb

## 2024-03-29 DIAGNOSIS — N3090 Cystitis, unspecified without hematuria: Secondary | ICD-10-CM

## 2024-03-29 DIAGNOSIS — N6489 Other specified disorders of breast: Secondary | ICD-10-CM

## 2024-03-29 DIAGNOSIS — R350 Frequency of micturition: Secondary | ICD-10-CM | POA: Diagnosis not present

## 2024-03-29 LAB — POCT URINALYSIS DIPSTICK
Bilirubin, UA: NEGATIVE
Blood, UA: POSITIVE
Glucose, UA: NEGATIVE
Ketones, UA: NEGATIVE
Nitrite, UA: NEGATIVE
Protein, UA: NEGATIVE
Spec Grav, UA: 1.02 (ref 1.010–1.025)
Urobilinogen, UA: 0.2 U/dL
pH, UA: 6 (ref 5.0–8.0)

## 2024-03-29 MED ORDER — CEPHALEXIN 500 MG PO CAPS: 500.0000 mg | ORAL_CAPSULE | Freq: Three times a day (TID) | ORAL | 0 refills | Status: AC

## 2024-03-29 MED ORDER — MUPIROCIN 2 % EX OINT
1.0000 | TOPICAL_OINTMENT | Freq: Two times a day (BID) | CUTANEOUS | 0 refills | Status: AC
Start: 2024-03-29 — End: ?

## 2024-03-29 NOTE — Telephone Encounter (Signed)
 FYI Only or Action Required?: FYI only for provider.  Patient was last seen in primary care on 03/10/2024 by Lucius Krabbe, NP.  Called Nurse Triage reporting Dysuria.  Symptoms began yesterday.  Interventions attempted: Nothing.  Symptoms are: gradually worsening.  Triage Disposition: See Physician Within 24 Hours  Patient/caregiver understands and will follow disposition?: Yes     Copied from CRM #[id number]. Topic: Clinical - Red Word Triage >> Mar 29, 2024 10:30 AM Suzen RAMAN wrote: Red Word that prompted transfer to Nurse Triage: cramps,urine urgency,burning/discomfort when urinating. Previous dx with bacterial vaginosis that has now turn in UTI per pt Reason for Disposition  Urinating more frequently than usual (i.e., frequency) OR new-onset of the feeling of an urgent need to urinate (i.e., urgency)  Answer Assessment - Initial Assessment Questions 1. SYMPTOM: What's the main symptom you're concerned about? (e.g., frequency, incontinence)     Frequency, pelvic pressure, burning with urination 2. ONSET: When did the  urinary symptoms  start?     yesterday 3. PAIN: Is there any pain? If Yes, ask: How bad is it? (Scale: 1-10; mild, moderate, severe)     6-7 4. CAUSE: What do you think is causing the symptoms?     Thinks its a UTI 5. OTHER SYMPTOMS: Do you have any other symptoms? (e.g., blood in urine, fever, flank pain, pain with urination)     no 6. PREGNANCY: Is there any chance you are pregnant? When was your last menstrual period?     no  Protocols used: Urinary Symptoms-A-AH  Reason for Disposition  Urinating more frequently than usual (i.e., frequency) OR new-onset of the feeling of an urgent need to urinate (i.e., urgency)  Answer Assessment - Initial Assessment Questions Pt states she was diagnosed with bacterial vaginosis last week. Yesterday and this am she began to have what she says is more UTI symptoms. She is having pain with urination,  pelvic pain, increased frequency.     1. SYMPTOM: What's the main symptom you're concerned about? (e.g., frequency, incontinence)     Frequency, pelvic pressure, burning with urination 2. ONSET: When did the  urinary symptoms  start?     yesterday 3. PAIN: Is there any pain? If Yes, ask: How bad is it? (Scale: 1-10; mild, moderate, severe)     6-7 4. CAUSE: What do you think is causing the symptoms?     Thinks its a UTI 5. OTHER SYMPTOMS: Do you have any other symptoms? (e.g., blood in urine, fever, flank pain, pain with urination)     no 6. PREGNANCY: Is there any chance you are pregnant? When was your last menstrual period?     no  Protocols used: Urinary Symptoms-A-AH

## 2024-03-29 NOTE — Progress Notes (Signed)
 Patient ID: Kim Pacheco, female    DOB: [birth year], 38 y.o.   MRN: [id number]  Chief Complaint  Patient presents with   Urinary Frequency    Pt c/o urinary frequency, pelvic pain, discomfort around vagina and anus. Pt   Discussed the use of AI scribe software for clinical note transcription with the patient, who gave verbal consent to proceed.  History of Present Illness Kim Pacheco is a 38 year old female who presents with a burning sensation and possible urinary tract infection.  She experiences a burning sensation that began yesterday and continues today. She took one dose of Keflex  this morning, which she usually takes after intercourse but forgot this time. She has a history of urinary tract infections treated with Keflex , which she tolerates well. She does not recall her last positive test for a bladder infection. She has a history of yeast infection and bacterial vaginosis and did not tolerate metronidazole or diflucan well. She started a tinidazole for bacterial vaginosis and was also given an antifungal cream for the yeast. She noted a thin discharge last week.  She has soreness and a crack on her left nipple for about a week, initially with bloody discharge and crusting.  Assessment & Plan Urinary tract infection with urinary frequency Mild UTI with burning sensation. Keflex  may have reduced severity. No recent positive bladder infection history. Previous UTIs treated with Keflex , well-tolerated. - Prescribe Keflex  500mg  three times a day. - Send urine culture to confirm Keflex  efficacy. - Advise to resume post-intercourse Keflex  after completing current course. - Recommend eating something prior to taking Keflex  doses. - Discussed potential benefit of probiotics or yogurt to prevent yeast infections. - Call back if sx are not resolving.  Bacterial vaginosis and vulvovaginal candidiasis Previous metronidazole poorly tolerated. Current treatment for  bacterial vaginosis ongoing. Thin discharge noted last week. - Continue current treatment for bacterial vaginosis. - Allow to cut treatment short by one day if symptoms resolve.  Left nipple pain Sore left nipple with crack, crusting, and initial bloody drainage. Possible dryness contributing. Mammogram status needs investigation. - Prescribe mupirocin  2% antibiotic ointment. - Advise cleaning with soap and water , drying well, and applying a thin layer of ointment. - Phone number provided for [facility] to schedule Mammogram. - Call back if sx are not improved by next week.   Subjective:    Outpatient Medications Prior to Visit  Medication Sig Dispense Refill   Albuterol -Budesonide (AIRSUPRA ) 90-80 MCG/ACT AERO Inhale 2 Inhalations into the lungs every 6 (six) hours as needed. 1 g 3   cephALEXin  (KEFLEX ) 500 MG capsule Take 1 capsule po after intercourse to prevent UTI.     clotrimazole (GYNE-LOTRIMIN) 1 % vaginal cream Place 1 applicatorful vaginally at bedtime for 5 days. 45 g 0   fluconazole (DIFLUCAN) 150 MG tablet Take 1 pill today and the 2nd pill in 3 days. 2 tablet 0   FLUoxetine  (PROZAC ) 10 MG tablet Take 1 tablet (10 mg total) by mouth daily. 90 tablet 1   PHENTERMINE HCL PO Take by mouth. Pt is unsure of dose, Prescribed by [facility]([address])     propranolol  (INDERAL ) 10 MG tablet Take 1 tablet (10 mg total) by mouth 3 (three) times daily as needed. For anxiety/increased stress. 30 tablet 1   tinidazole (TINDAMAX) 500 MG tablet Take 2 tablets (1,000 mg total) by mouth daily with breakfast. 10 tablet 0   topiramate (TOPAMAX) 25 MG tablet Take 25 mg by mouth  daily.     No facility-administered medications prior to visit.   Past Medical History:  Diagnosis Date   Abdominal pain    started in mid abd and spread to left side/on and off/since Jan 2020/ pt states, she has fluid in abd/unknown   Abnormal CT of the abdomen    Abnormal glucose  tolerance test (GTT) during pregnancy, antepartum 09/17/2017   Abnormal Pap smear    normal 06/2013   Anal fissure 05/07/2020   Anxiety    ARF (acute renal failure) 06/25/2018   Ascites 06/24/2018   Atypical chest pain 03/14/2022   Cellulitis of breast 05/29/2018   Change in bowel habit 05/07/2020   Change in bowel habits 10/29/2023   Chronic abdominal pain 04/13/2020   Chronic hepatitis (HCC) 09/18/2023   Constipation    hx of   Constipation, chronic 06/24/2018   Depression    suicide attempt in college   Diabetes mellitus without complication (HCC)    gestational, no issues since pregnancy per her report - resolved   Free intraperitoneal air 05/08/2020   Gestational diabetes    /no meds now - resolved   Gestational diabetes mellitus, class A2 09/09/2017   h.o first pregnancy, early 1 hr     Headache 09/18/2023   Hepatic fibrosis 11/30/2020   In October 2019 she had an ultrasound with elastography that showed hepatic steatosis and estimated F0-F1 fibrosis.      She underwent transjugular liver biopsy in November 2021 with with hepatic venous pressure gradient of 13 indicating portal hypertension, free hepatic venous pressure was 17 mmHg with a wedge of 30 mmHg.   Biopsy showed mildly active chronic hepatitis grade 2/4, stage I-2 fibros   Hepatic steatosis 05/07/2020   Patient with a history of hepatic steatosis on biopsy and fibroscan in the setting of chronic alcohol use.     Reviewed with patient that ongoing alcohol use may contribute to continued fat within the liver which can cause inflammation and progression of scarring.     Reviewed the need for a low fat/cholesterol/carbohydrate diet, increase exercise, moderate weight loss, and discontinuation of alco   Hepatitis    Hep B, managed by Dr. Luis   History of cesarean section 05/08/2017   arrest of dilation, likely repeat c/s     History of chicken pox    History of depression 05/07/2020   History of gestational diabetes  05/07/2020   History of recurrent UTI (urinary tract infection) 05/08/2020   History of suicide attempt 2003   Attempt in college   History of urinary retention 04/15/2020   History of UTI    Irregular intermenstrual bleeding 09/18/2023   Menstrual period late 09/18/2023   Metrorrhagia 05/29/2018   Noncompliance 05/24/2021   Patient continues to be noncompliant with medication dosing and laboratory testing despite multiple conversations at previous office visits.     Again I had a frank discussion with the patient regarding:   Pharmacy refill information and  lab results continue to show that she is not taking her vemlidy daily as prescribed.     As we have discussed at multiple prior visits failing to take her veml   Oral hypoglycemic controlled White classification A2 gestational diabetes mellitus (GDM) 11/18/2017   Overweight with body mass index (BMI) 25.0-29.9 09/18/2023   Periumbilical pain 05/07/2020   Pneumoperitoneum 05/07/2020   Portal hypertension (HCC) 12/07/2020   Patient had evidence of portal hypertension on venogram however this may have been in the setting of acute  alcohol hepatitis.       Liver biopsy at that time reported F1-F2 fibrosis.       She had EGD in 2021 with no evidence of esophageal varices.       Her previously reported ascites may have been extravasation of urine from a perforated bladder.     FibroScan in November 2022 with liver stiffne   Postpartum state 09/18/2023   Pregnancy 05/12/2017   Previous cesarean delivery, delivered: arrest of dilation  11/18/2017   Rectal bleeding 10/29/2023   Vaginal Pap smear, abnormal    Vaginitis 09/18/2023   White classification A2 gestational diabetes mellitus (GDM) 10/29/2023   h.o first pregnancy, early 1 hr     Past Surgical History:  Procedure Laterality Date   BIOPSY  05/10/2020   Procedure: BIOPSY;  Surgeon: Leigh Elspeth SQUIBB, MD;  Location: [facility];  Service: Gastroenterology;;   BLADDER REPAIR N/A  05/17/2020   Procedure: EXPLORATORY LAPAROTOMY WITH CYSTORRHAPHY;  Surgeon: Matilda Senior, MD;  Location: [facility];  Service: Urology;  Laterality: N/A;   CESAREAN SECTION N/A 01/26/2013   Procedure: Primary Cesarean Section Delivery Baby  Boy @ 0017, Apgars 4/8/9;  Surgeon: Marjorie DEL. Okey, MD;  Location: [facility];  Service: Obstetrics;  Laterality: N/A;   CESAREAN SECTION N/A 11/18/2017   Procedure: CESAREAN SECTION;  Surgeon: Gorge Ade, MD;  Location: [facility];  Service: Obstetrics;  Laterality: N/A;   COLONOSCOPY  2020   DIAGNOSTIC LAPAROSCOPIC LIVER BIOPSY N/A 05/07/2020   Procedure: DIAGNOSTIC LAPAROSCOPIC;  Surgeon: Sheldon Elspeth, MD;  Location: [facility];  Service: General;  Laterality: N/A;   DIAGNOSTIC LAPAROSCOPY  08/25/2018   Dr Arlee with Ob/Gyn3/2020   ESOPHAGOGASTRODUODENOSCOPY (EGD) WITH PROPOFOL  N/A 05/10/2020   Procedure: ESOPHAGOGASTRODUODENOSCOPY (EGD) WITH PROPOFOL ;  Surgeon: Leigh Elspeth SQUIBB, MD;  Location: [facility];  Service: Gastroenterology;  Laterality: N/A;   ESOPHAGOGASTRODUODENOSCOPY ENDOSCOPY     IR TRANSCATHETER BX  04/18/2020   IR US  GUIDE VASC ACCESS RIGHT  04/18/2020   IR VENOGRAM HEPATIC W HEMODYNAMIC EVALUATION  04/18/2020   NASAL SINUS SURGERY  1996   WISDOM TOOTH EXTRACTION     Allergies  Allergen Reactions   Contrast Media [Iodinated Contrast Media] Anaphylaxis, Shortness Of Breath and Rash    IV contrast    Nsaids Other (See Comments)    Hx gastritis & gastric perforation    Prednisone  & Diphenhydramine  Other (See Comments)    Don't like to take it   Prednisone      Other reaction(s): Other Don't like to take it      Objective:    Physical Exam Vitals and nursing note reviewed.  Constitutional:      Appearance: Normal appearance.  Cardiovascular:     Rate and Rhythm: Normal rate and regular rhythm.  Pulmonary:     Effort: Pulmonary effort is normal.     Breath sounds: Normal breath sounds.  Chest:  Breasts:    Left:  Nipple discharge (left side inverted crack with mild crusting, no active drainage or bleeding, mild tenderness) present.  Musculoskeletal:        General: Normal range of motion.  Skin:    General: Skin is warm and dry.  Neurological:     Mental Status: She is alert.  Psychiatric:        Mood and Affect: Mood normal.        Behavior: Behavior normal.    BP 112/60 (BP Location: Left Arm, Patient Position: Sitting, Cuff Size: Normal)  Pulse 71   Temp (!) 97.3 F (36.3 C) (Temporal)   Ht 4' 11 (1.499 m)   Wt 122 lb (55.3 kg)   LMP 02/04/2024 (Exact Date)   SpO2 98%   BMI 24.64 kg/m  Wt Readings from Last 3 Encounters:  03/29/24 122 lb (55.3 kg)  03/18/24 122 lb 5.7 oz (55.5 kg)  03/10/24 122 lb 6.4 oz (55.5 kg)      Lucius Krabbe, NP

## 2024-03-29 NOTE — Telephone Encounter (Signed)
 Appt today

## 2024-03-30 LAB — URINE CULTURE
MICRO NUMBER:: 17121345
Result:: NO GROWTH
SPECIMEN QUALITY:: ADEQUATE

## 2024-03-31 ENCOUNTER — Ambulatory Visit: Payer: Self-pay | Admitting: Family

## 2024-03-31 ENCOUNTER — Encounter: Admitting: Family Medicine

## 2024-03-31 NOTE — Progress Notes (Signed)
 Kim Pacheco   Needs in person given symptoms and assessment needs of breast area.

## 2024-04-01 DIAGNOSIS — N644 Mastodynia: Secondary | ICD-10-CM | POA: Diagnosis not present

## 2024-04-01 DIAGNOSIS — N6452 Nipple discharge: Secondary | ICD-10-CM | POA: Diagnosis not present

## 2024-04-07 ENCOUNTER — Encounter: Payer: Self-pay | Admitting: Family

## 2024-04-07 DIAGNOSIS — R238 Other skin changes: Secondary | ICD-10-CM

## 2024-04-08 NOTE — Telephone Encounter (Signed)
yes, thanks

## 2024-04-21 DIAGNOSIS — L299 Pruritus, unspecified: Secondary | ICD-10-CM | POA: Diagnosis not present

## 2024-04-21 DIAGNOSIS — R92333 Mammographic heterogeneous density, bilateral breasts: Secondary | ICD-10-CM | POA: Diagnosis not present

## 2024-04-21 DIAGNOSIS — N6453 Retraction of nipple: Secondary | ICD-10-CM | POA: Diagnosis not present

## 2024-04-21 DIAGNOSIS — N644 Mastodynia: Secondary | ICD-10-CM | POA: Diagnosis not present

## 2024-04-21 DIAGNOSIS — R234 Changes in skin texture: Secondary | ICD-10-CM | POA: Diagnosis not present

## 2024-04-22 NOTE — Progress Notes (Unsigned)
 Medical Nutrition Therapy  Appointment Start time:  [phone]  Appointment End time:  0910  Primary concerns today: reduce body weight to a lower BMI range  Referral diagnosis: Excess skin of abdomen, nutritional deficiency Preferred learning style:   no preference indicated Learning readiness:  contemplating-ready   NUTRITION ASSESSMENT   Clinical Medical Hx:  Past Medical History:  Diagnosis Date   Abdominal pain    started in mid abd and spread to left side/on and off/since Jan 2020/ pt states, she has fluid in abd/unknown   Abnormal CT of the abdomen    Abnormal glucose tolerance test (GTT) during pregnancy, antepartum 09/17/2017   Abnormal Pap smear    normal 06/2013   Anal fissure 05/07/2020   Anxiety    ARF (acute renal failure) 06/25/2018   Ascites 06/24/2018   Atypical chest pain 03/14/2022   Cellulitis of breast 05/29/2018   Change in bowel habit 05/07/2020   Change in bowel habits 10/29/2023   Chronic abdominal pain 04/13/2020   Chronic hepatitis (HCC) 09/18/2023   Constipation    hx of   Constipation, chronic 06/24/2018   Depression    suicide attempt in college   Diabetes mellitus without complication (HCC)    gestational, no issues since pregnancy per her report - resolved   Free intraperitoneal air 05/08/2020   Gestational diabetes    /no meds now - resolved   Gestational diabetes mellitus, class A2 09/09/2017   h.o first pregnancy, early 1 hr     Headache 09/18/2023   Hepatic fibrosis 11/30/2020   In October 2019 she had an ultrasound with elastography that showed hepatic steatosis and estimated F0-F1 fibrosis.      She underwent transjugular liver biopsy in November 2021 with with hepatic venous pressure gradient of 13 indicating portal hypertension, free hepatic venous pressure was 17 mmHg with a wedge of 30 mmHg.   Biopsy showed mildly active chronic hepatitis grade 2/4, stage I-2 fibros   Hepatic steatosis 05/07/2020   Patient with a history of hepatic  steatosis on biopsy and fibroscan in the setting of chronic alcohol use.     Reviewed with patient that ongoing alcohol use may contribute to continued fat within the liver which can cause inflammation and progression of scarring.     Reviewed the need for a low fat/cholesterol/carbohydrate diet, increase exercise, moderate weight loss, and discontinuation of alco   Hepatitis    Hep B, managed by Dr. Luis   History of cesarean section 05/08/2017   arrest of dilation, likely repeat c/s     History of chicken pox    History of depression 05/07/2020   History of gestational diabetes 05/07/2020   History of recurrent UTI (urinary tract infection) 05/08/2020   History of suicide attempt 2003   Attempt in college   History of urinary retention 04/15/2020   History of UTI    Irregular intermenstrual bleeding 09/18/2023   Menstrual period late 09/18/2023   Metrorrhagia 05/29/2018   Noncompliance 05/24/2021   Patient continues to be noncompliant with medication dosing and laboratory testing despite multiple conversations at previous office visits.     Again I had a frank discussion with the patient regarding:   Pharmacy refill information and  lab results continue to show that she is not taking her vemlidy daily as prescribed.     As we have discussed at multiple prior visits failing to take her veml   Oral hypoglycemic controlled White classification A2 gestational diabetes mellitus (GDM) 11/18/2017  Overweight with body mass index (BMI) 25.0-29.9 09/18/2023   Periumbilical pain 05/07/2020   Pneumoperitoneum 05/07/2020   Portal hypertension (HCC) 12/07/2020   Patient had evidence of portal hypertension on venogram however this may have been in the setting of acute alcohol hepatitis.       Liver biopsy at that time reported F1-F2 fibrosis.       She had EGD in 2021 with no evidence of esophageal varices.       Her previously reported ascites may have been extravasation of urine from a perforated  bladder.     FibroScan in November 2022 with liver stiffne   Postpartum state 09/18/2023   Pregnancy 05/12/2017   Previous cesarean delivery, delivered: arrest of dilation  11/18/2017   Rectal bleeding 10/29/2023   Vaginal Pap smear, abnormal    Vaginitis 09/18/2023   White classification A2 gestational diabetes mellitus (GDM) 10/29/2023   h.o first pregnancy, early 1 hr      Medications:  Current Outpatient Medications:    Albuterol -Budesonide (AIRSUPRA ) 90-80 MCG/ACT AERO, Inhale 2 Inhalations into the lungs every 6 (six) hours as needed. (Patient not taking: Reported on 04/28/2024), Disp: 1 g, Rfl: 3   cephALEXin  (KEFLEX ) 500 MG capsule, Take 1 capsule po after intercourse to prevent UTI. (Patient not taking: Reported on 04/28/2024), Disp: , Rfl:    fluconazole (DIFLUCAN) 150 MG tablet, Take 1 pill today and the 2nd pill in 3 days. (Patient not taking: Reported on 04/28/2024), Disp: 2 tablet, Rfl: 0   FLUoxetine  (PROZAC ) 10 MG tablet, Take 1 tablet (10 mg total) by mouth daily. (Patient not taking: Reported on 04/28/2024), Disp: 90 tablet, Rfl: 1   mupirocin  ointment (BACTROBAN ) 2 %, Apply 1 Application topically 2 (two) times daily. Apply a thin layer to left nipple after washing with soap and water . (Patient not taking: Reported on 04/28/2024), Disp: 22 g, Rfl: 0   PHENTERMINE HCL PO, Take by mouth. Pt is unsure of dose, Prescribed by [facility]([address]) (Patient not taking: Reported on 04/28/2024), Disp: , Rfl:    propranolol  (INDERAL ) 10 MG tablet, Take 1 tablet (10 mg total) by mouth 3 (three) times daily as needed. For anxiety/increased stress. (Patient not taking: Reported on 04/28/2024), Disp: 30 tablet, Rfl: 1   tinidazole (TINDAMAX) 500 MG tablet, Take 2 tablets (1,000 mg total) by mouth daily with breakfast. (Patient not taking: Reported on 04/28/2024), Disp: 10 tablet, Rfl: 0   topiramate (TOPAMAX) 25 MG tablet, Take 25 mg by mouth daily. (Patient not taking:  Reported on 04/28/2024), Disp: , Rfl:   Labs:  Lab Results  Component Value Date   HGBA1C 5.5 03/14/2022   Lab Results  Component Value Date   ALT 493 (H) 03/18/2024   AST 370 (H) 03/18/2024   ALKPHOS 56 03/18/2024   BILITOT 0.9 03/18/2024   Notable Signs/Symptoms:  Wt Readings from Last 3 Encounters:  03/29/24 122 lb (55.3 kg)  03/18/24 122 lb 5.7 oz (55.5 kg)  03/10/24 122 lb 6.4 oz (55.5 kg)   Lifestyle & Dietary Hx Pt presents today for an initial assessment late. Pt reports a desire reduce body weight to a lower BMI range. Pt report she is no longer taking phentermine. Pt reports she has tried keto diet and feels this is no sustainable for her. Pt reports she is working full time from home combined sitting and walking. Pt reports shared cooking and shopping with her husband. Pt reports her husband choices are not always healthy. Pt reports  her children are picky eaters and feels this contributes to her dietary choices.   Pt reports eating out three times weekly. Pt reports her appetite is not well controlled and states I have a sweet tooth. Pt reports history of GDM requiring insulin medication in one of two pregnancies.   Pt reports weather or  having enough time as barriers to increasing physical activity. Pt states she does not like cardio. Pt reports she has used an app to track her dietary intake in the past and d/c due to not enough time. All Pt's questions were answered during this encounter.    Estimated daily fluid intake: 45 ounces Supplements: none Sleep: 6-8 hours nightly  Stress / self-care: 7 out of 10 / self care includes: smoking cigarettes, gym  Current average weekly physical activity: 0-4/d/w for 60 minutes weights  24-Hr Dietary Recall First Meal: coffee with flavored creamer and equal, flavored greek yogurt  Snack: none Second Meal: ~12-1 pm: leftovers noodles, red sauce, cheese, sausage, water  Snack: none Third Meal: 6 pm: lasagna or  steak, baked potato or chicken, broccoli, water   Snack: ice cream or whole milk with cookies Beverages: coffee with flavored creamer and equal, water , wine or mixed drink on weekends  NUTRITION DIAGNOSIS  NB-1.1 Food and nutrition-related knowledge deficit As related to limited previous nutrition related education.  As evidenced by Pt reports and dietary recall.  NUTRITION INTERVENTION  Nutrition education (E-1) on the following topics:  Fruits & Vegetables: Aim to fill half your plate with a variety of fruits and vegetables. They are rich in vitamins, minerals, and fiber, and can help reduce the risk of chronic diseases. Choose a colorful assortment of fruits and vegetables to ensure you get a wide range of nutrients. Grains and Starches: Make at least half of your grain choices whole grains, such as brown rice, whole wheat bread, and oats. Whole grains provide fiber, which aids in digestion and healthy cholesterol levels. Aim for whole forms of starchy vegetables such as potatoes, sweet potatoes, beans, peas, and corn, which are fiber rich and provide many vitamins and minerals.  Protein: Incorporate lean sources of protein, such as poultry, fish, beans, nuts, and seeds, into your meals. Protein is essential for building and repairing tissues, staying full, balancing blood sugar, as well as supporting immune function. Dairy: Include low-fat or fat-free dairy products like milk, yogurt, and cheese in your diet. Dairy foods are excellent sources of calcium  and vitamin D , which are crucial for bone health.  Physical Activity: Aim for 60 minutes of physical activity daily. Regular physical activity promotes overall health-including helping to reduce risk for heart disease and diabetes, promoting mental health, and helping us  sleep better.    Handouts Provided Include  [employer] Move Your way-[facility]- [employer]  Learning Style & Readiness for Change Teaching method  utilized: Visual & Auditory  Demonstrated degree of understanding via: Teach Back  Barriers to learning/adherence to lifestyle change: weather, time management, environment   Goals Established by Pt Increase non starchy vegetables aiming for half a plate Increase water  intake aiming for 64-90 ounces daily    MONITORING & EVALUATION Dietary intake, weekly physical activity, and water  intake  Next Steps  Patient is to return PRN.

## 2024-04-28 ENCOUNTER — Encounter: Attending: Family | Admitting: Dietician

## 2024-04-28 DIAGNOSIS — E639 Nutritional deficiency, unspecified: Secondary | ICD-10-CM | POA: Insufficient documentation

## 2024-04-28 DIAGNOSIS — L987 Excessive and redundant skin and subcutaneous tissue: Secondary | ICD-10-CM | POA: Insufficient documentation

## 2024-04-28 NOTE — Patient Instructions (Addendum)
  Increase non starchy vegetables aiming for half a plate Increase water  intake aiming for 64-90 ounces daily

## 2024-05-20 ENCOUNTER — Other Ambulatory Visit: Payer: Self-pay | Admitting: Medical Genetics

## 2024-05-21 DIAGNOSIS — J029 Acute pharyngitis, unspecified: Secondary | ICD-10-CM | POA: Diagnosis not present

## 2024-05-21 DIAGNOSIS — R059 Cough, unspecified: Secondary | ICD-10-CM | POA: Diagnosis not present

## 2024-05-21 DIAGNOSIS — Z20822 Contact with and (suspected) exposure to covid-19: Secondary | ICD-10-CM | POA: Diagnosis not present

## 2024-05-21 DIAGNOSIS — J019 Acute sinusitis, unspecified: Secondary | ICD-10-CM | POA: Diagnosis not present

## 2024-05-21 DIAGNOSIS — B9789 Other viral agents as the cause of diseases classified elsewhere: Secondary | ICD-10-CM | POA: Diagnosis not present

## 2024-05-27 ENCOUNTER — Other Ambulatory Visit (HOSPITAL_COMMUNITY)
Admission: RE | Admit: 2024-05-27 | Discharge: 2024-05-27 | Disposition: A | Discharge status: Home / Self Care | Source: Ambulatory Visit | Attending: Family | Admitting: Family

## 2024-05-27 ENCOUNTER — Encounter: Payer: Self-pay | Admitting: Family

## 2024-05-27 ENCOUNTER — Ambulatory Visit: Admitting: Family

## 2024-05-27 VITALS — BP 110/70 | HR 85 | Temp 97.9°F | Ht 59.0 in | Wt 123.2 lb

## 2024-05-27 DIAGNOSIS — Z1211 Encounter for screening for malignant neoplasm of colon: Secondary | ICD-10-CM

## 2024-05-27 DIAGNOSIS — Z0000 Encounter for general adult medical examination without abnormal findings: Secondary | ICD-10-CM | POA: Diagnosis not present

## 2024-05-27 DIAGNOSIS — Z01419 Encounter for gynecological examination (general) (routine) without abnormal findings: Secondary | ICD-10-CM | POA: Insufficient documentation

## 2024-05-27 LAB — CBC WITH DIFFERENTIAL/PLATELET
Basophils Absolute: 0 K/uL (ref 0.0–0.1)
Basophils Relative: 0.8 % (ref 0.0–3.0)
Eosinophils Absolute: 0.1 K/uL (ref 0.0–0.7)
Eosinophils Relative: 1.5 % (ref 0.0–5.0)
HCT: 39.4 % (ref 36.0–46.0)
Hemoglobin: 13.5 g/dL (ref 12.0–15.0)
Lymphocytes Relative: 47.6 % — ABNORMAL HIGH (ref 12.0–46.0)
Lymphs Abs: 2.1 K/uL (ref 0.7–4.0)
MCHC: 34.3 g/dL (ref 30.0–36.0)
MCV: 90.8 fl (ref 78.0–100.0)
Monocytes Absolute: 0.4 K/uL (ref 0.1–1.0)
Monocytes Relative: 10.2 % (ref 3.0–12.0)
Neutro Abs: 1.8 K/uL (ref 1.4–7.7)
Neutrophils Relative %: 39.9 % — ABNORMAL LOW (ref 43.0–77.0)
Platelets: 209 K/uL (ref 150.0–400.0)
RBC: 4.34 Mil/uL (ref 3.87–5.11)
RDW: 13 % (ref 11.5–15.5)
WBC: 4.4 K/uL (ref 4.0–10.5)

## 2024-05-27 LAB — COMPREHENSIVE METABOLIC PANEL WITH GFR
ALT: 34 U/L (ref 3–35)
AST: 41 U/L — ABNORMAL HIGH (ref 5–37)
Albumin: 4.1 g/dL (ref 3.5–5.2)
Alkaline Phosphatase: 32 U/L — ABNORMAL LOW (ref 39–117)
BUN: 14 mg/dL (ref 6–23)
CO2: 25 meq/L (ref 19–32)
Calcium: 9.2 mg/dL (ref 8.4–10.5)
Chloride: 102 meq/L (ref 96–112)
Creatinine, Ser: 0.56 mg/dL (ref 0.40–1.20)
GFR: 114.18 mL/min (ref >60.00)
Glucose, Bld: 141 mg/dL — ABNORMAL HIGH (ref 70–99)
Potassium: 4.1 meq/L (ref 3.5–5.1)
Sodium: 135 meq/L (ref 135–145)
Total Bilirubin: 0.6 mg/dL (ref 0.2–1.2)
Total Protein: 7.5 g/dL (ref 6.0–8.3)

## 2024-05-27 LAB — LIPID PANEL
Cholesterol: 136 mg/dL (ref 28–200)
HDL: 62.5 mg/dL (ref >39.00)
LDL Cholesterol: 58 mg/dL (ref 10–99)
NonHDL: 73.78
Total CHOL/HDL Ratio: 2
Triglycerides: 78 mg/dL (ref 10.0–149.0)
VLDL: 15.6 mg/dL (ref 0.0–40.0)

## 2024-05-27 LAB — TSH: TSH: 1.18 u[IU]/mL (ref 0.35–5.50)

## 2024-05-27 NOTE — Patient Instructions (Addendum)
 It was very nice to see you today!   I will review your lab results via MyChart in a few days.  You look great! Stay well! Keep Exercising!  Have a wonderful holiday :-)      PLEASE NOTE:  If you had any lab tests please let us  know if you have not heard back within a few days. You may see your results on MyChart before we have a chance to review them but we will give you a call once they are reviewed by us . If we ordered any referrals today, please let us  know if you have not heard from their office within the next week.

## 2024-05-27 NOTE — Progress Notes (Signed)
 Phone [phone]  Subjective:   Patient is a 38 y.o. female presenting for annual physical.    Chief Complaint  Patient presents with   Annual Exam    Non Fasting w/ labs  Discussed the use of AI scribe software for clinical note transcription with the patient, who gave verbal consent to proceed.  History of Present Illness Kim Pacheco is a 38 year old female who presents for an annual physical exam and Pap smear.  She has had a sinus infection for about a month with grogginess and ear discomfort that has limited her exercise, and she plans to resume gym workouts after the holidays. She reports prior nipple discharge and had a mammogram a couple of months ago. She describes 5 to 6 years of memory issues, including one time forgetting her address, which she associates with stress, poor sleep, or nutrition. Her menstrual cycles are monthly but often late by a few days to two weeks, and she is not using birth control. She drinks 1 bottle of wine & 3 to 4 beers per week, reduced from about a bottle of wine every other day.  See problem oriented charting- ROS- full  review of systems was completed and negative except for what is noted in HPI above.  The following were reviewed and entered/updated in epic: Past Medical History:  Diagnosis Date   Abdominal pain    started in mid abd and spread to left side/on and off/since Jan 2020/ pt states, she has fluid in abd/unknown   Abnormal CT of the abdomen    Abnormal glucose tolerance test (GTT) during pregnancy, antepartum 09/17/2017   Abnormal Pap smear    normal 06/2013   Anal fissure 05/07/2020   Anxiety    ARF (acute renal failure) 06/25/2018   Ascites 06/24/2018   Atypical chest pain 03/14/2022   Cellulitis of breast 05/29/2018   Change in bowel habit 05/07/2020   Change in bowel habits 10/29/2023   Chronic abdominal pain 04/13/2020   Chronic hepatitis (HCC) 09/18/2023   Constipation    hx of   Constipation, chronic  06/24/2018   Depression    suicide attempt in college   Diabetes mellitus without complication (HCC)    gestational, no issues since pregnancy per her report - resolved   Free intraperitoneal air 05/08/2020   Gestational diabetes    /no meds now - resolved   Gestational diabetes mellitus, class A2 09/09/2017   h.o first pregnancy, early 1 hr     Headache 09/18/2023   Hepatic fibrosis 11/30/2020   In October 2019 she had an ultrasound with elastography that showed hepatic steatosis and estimated F0-F1 fibrosis.      She underwent transjugular liver biopsy in November 2021 with with hepatic venous pressure gradient of 13 indicating portal hypertension, free hepatic venous pressure was 17 mmHg with a wedge of 30 mmHg.   Biopsy showed mildly active chronic hepatitis grade 2/4, stage I-2 fibros   Hepatic steatosis 05/07/2020   Patient with a history of hepatic steatosis on biopsy and fibroscan in the setting of chronic alcohol use.     Reviewed with patient that ongoing alcohol use may contribute to continued fat within the liver which can cause inflammation and progression of scarring.     Reviewed the need for a low fat/cholesterol/carbohydrate diet, increase exercise, moderate weight loss, and discontinuation of alco   Hepatitis    Hep B, managed by Dr. Luis   History of cesarean section 05/08/2017   arrest  of dilation, likely repeat c/s     History of chicken pox    History of depression 05/07/2020   History of gestational diabetes 05/07/2020   History of recurrent UTI (urinary tract infection) 05/08/2020   History of suicide attempt 2003   Attempt in college   History of urinary retention 04/15/2020   History of UTI    Irregular intermenstrual bleeding 09/18/2023   Menstrual period late 09/18/2023   Metrorrhagia 05/29/2018   Noncompliance 05/24/2021   Patient continues to be noncompliant with medication dosing and laboratory testing despite multiple conversations at previous office  visits.     Again I had a frank discussion with the patient regarding:   Pharmacy refill information and  lab results continue to show that she is not taking her vemlidy daily as prescribed.     As we have discussed at multiple prior visits failing to take her veml   Oral hypoglycemic controlled White classification A2 gestational diabetes mellitus (GDM) 11/18/2017   Overweight with body mass index (BMI) 25.0-29.9 09/18/2023   Periumbilical pain 05/07/2020   Pneumoperitoneum 05/07/2020   Portal hypertension (HCC) 12/07/2020   Patient had evidence of portal hypertension on venogram however this may have been in the setting of acute alcohol hepatitis.       Liver biopsy at that time reported F1-F2 fibrosis.       She had EGD in 2021 with no evidence of esophageal varices.       Her previously reported ascites may have been extravasation of urine from a perforated bladder.     FibroScan in November 2022 with liver stiffne   Postpartum state 09/18/2023   Pregnancy 05/12/2017   Previous cesarean delivery, delivered: arrest of dilation  11/18/2017   Rectal bleeding 10/29/2023   Vaginal Pap smear, abnormal    Vaginitis 09/18/2023   White classification A2 gestational diabetes mellitus (GDM) 10/29/2023   h.o first pregnancy, early 1 hr     Patient Active Problem List   Diagnosis Date Noted   Cough variant asthma 11/04/2023   Allergic rhinitis 11/04/2023   Generalized anxiety disorder 10/29/2023   Elevated blood pressure reading in office without diagnosis of hypertension 10/29/2023   Cervical radiculopathy 05/06/2023   Extraperitoneal bladder perforation 05/17/2020   Anxiety    Hepatic steatosis 05/07/2020   Change in bowel habits 05/07/2020   Periumbilical abdominal pain 05/07/2020   Narcolepsy without cataplexy 02/02/2020   Depression, major, single episode, moderate (HCC) 10/28/2018   Elevated CA-125 08/06/2018   Alcohol abuse 06/24/2018   Fibrocystic disease of breast 05/29/2018    Chronic type B viral hepatitis (HCC) 06/14/2014   Past Surgical History:  Procedure Laterality Date   BIOPSY  05/10/2020   Procedure: BIOPSY;  Surgeon: Leigh Elspeth SQUIBB, MD;  Location: [facility];  Service: Gastroenterology;;   BLADDER REPAIR N/A 05/17/2020   Procedure: EXPLORATORY LAPAROTOMY WITH CYSTORRHAPHY;  Surgeon: Matilda Senior, MD;  Location: [facility];  Service: Urology;  Laterality: N/A;   CESAREAN SECTION N/A 01/26/2013   Procedure: Primary Cesarean Section Delivery Baby  Boy @ 0017, Apgars 4/8/9;  Surgeon: Marjorie DEL. Okey, MD;  Location: [facility];  Service: Obstetrics;  Laterality: N/A;   CESAREAN SECTION N/A 11/18/2017   Procedure: CESAREAN SECTION;  Surgeon: Gorge Ade, MD;  Location: [facility];  Service: Obstetrics;  Laterality: N/A;   COLONOSCOPY  2020   DIAGNOSTIC LAPAROSCOPIC LIVER BIOPSY N/A 05/07/2020   Procedure: DIAGNOSTIC LAPAROSCOPIC;  Surgeon: Sheldon Elspeth, MD;  Location: [facility];  Service: General;  Laterality: N/A;   DIAGNOSTIC LAPAROSCOPY  08/25/2018   Dr Arlee with Ob/Gyn3/2020   ESOPHAGOGASTRODUODENOSCOPY (EGD) WITH PROPOFOL  N/A 05/10/2020   Procedure: ESOPHAGOGASTRODUODENOSCOPY (EGD) WITH PROPOFOL ;  Surgeon: Leigh Elspeth SQUIBB, MD;  Location: [facility];  Service: Gastroenterology;  Laterality: N/A;   ESOPHAGOGASTRODUODENOSCOPY ENDOSCOPY     IR TRANSCATHETER BX  04/18/2020   IR US  GUIDE VASC ACCESS RIGHT  04/18/2020   IR VENOGRAM HEPATIC W HEMODYNAMIC EVALUATION  04/18/2020   NASAL SINUS SURGERY  1996   WISDOM TOOTH EXTRACTION      Family History  Adopted: Yes  Family history unknown: Yes    Medications- reviewed and updated Current Outpatient Medications  Medication Sig Dispense Refill   Albuterol -Budesonide (AIRSUPRA ) 90-80 MCG/ACT AERO Inhale 2 Inhalations into the lungs every 6 (six) hours as needed. 1 g 3   FLUoxetine  (PROZAC ) 10 MG tablet Take 1 tablet (10 mg total) by mouth daily. 90 tablet 1   mupirocin  ointment (BACTROBAN )  2 % Apply 1 Application topically 2 (two) times daily. Apply a thin layer to left nipple after washing with soap and water . 22 g 0   PHENTERMINE HCL PO Take by mouth. Pt is unsure of dose, Prescribed by [facility]([address])     propranolol  (INDERAL ) 10 MG tablet Take 1 tablet (10 mg total) by mouth 3 (three) times daily as needed. For anxiety/increased stress. 30 tablet 1   tinidazole  (TINDAMAX ) 500 MG tablet Take 2 tablets (1,000 mg total) by mouth daily with breakfast. 10 tablet 0   topiramate (TOPAMAX) 25 MG tablet Take 25 mg by mouth daily.     No current facility-administered medications for this visit.    Allergies-reviewed and updated Allergies[1]  Social History   Social History Narrative   Work or School: dog show - [employer] Situation: lives with husband, son born in 01/2013      Spiritual Beliefs: none      Lifestyle: no regular exercise; diet is fair - american diet      ADOPTED: believes biologic family from [place].  FHx unknown    Objective:  BP 110/70 (BP Location: Left Arm, Patient Position: Sitting, Cuff Size: Normal)   Pulse 85   Temp 97.9 F (36.6 C) (Temporal)   Ht 4' 11 (1.499 m)   Wt 123 lb 4 oz (55.9 kg)   SpO2 98%   BMI 24.89 kg/m  Physical Exam Vitals and nursing note reviewed. Exam conducted with a chaperone present.  Constitutional:      Appearance: Normal appearance.  HENT:     Head: Normocephalic.     Right Ear: Tympanic membrane normal.     Left Ear: Tympanic membrane normal.     Nose: Nose normal.     Mouth/Throat:     Mouth: Mucous membranes are moist.  Eyes:     Pupils: Pupils are equal, round, and reactive to light.  Cardiovascular:     Rate and Rhythm: Normal rate and regular rhythm.  Pulmonary:     Effort: Pulmonary effort is normal.     Breath sounds: Normal breath sounds.  Genitourinary:    Exam position: Lithotomy position.     Pubic Area: No rash or pubic lice.      Labia:         Right: No rash.        Left: No rash.      Vagina: Normal.     Cervix: Normal.  Comments: PAP smear specimen obtained for testing. Musculoskeletal:        General: Normal range of motion.     Cervical back: Normal range of motion.  Lymphadenopathy:     Cervical: No cervical adenopathy.  Skin:    General: Skin is warm and dry.  Neurological:     Mental Status: She is alert.  Psychiatric:        Mood and Affect: Mood normal.        Behavior: Behavior normal.      Assessment and Plan   Health Maintenance counseling: 1. Anticipatory guidance: Patient counseled regarding regular dental exams q6 months, eye exams,  avoiding smoking and second hand smoke, limiting alcohol to 1 beverage per day, no illicit drugs.   2. Risk factor reduction:  Advised patient of need for regular exercise and diet rich with fruits and vegetables to reduce risk of heart attack and stroke.  Wt Readings from Last 3 Encounters:  05/27/24 123 lb 4 oz (55.9 kg)  03/29/24 122 lb (55.3 kg)  03/18/24 122 lb 5.7 oz (55.5 kg)   3. Immunizations/screenings/ancillary studies Immunization History  Administered Date(s) Administered   Influenza, Mdck, Trivalent,PF 6+ MOS(egg free) 05/23/2014   Influenza, Seasonal, Injecte, Preservative Fre 03/10/2024   Influenza,inj,Quad PF,6+ Mos 02/28/2013, 06/27/2015, 03/28/2016, 04/09/2018, 03/11/2019, 04/15/2020, 06/06/2021, 03/14/2022   Influenza-Unspecified 05/23/2014, 04/11/2019   PFIZER(Purple Top)SARS-COV-2 Vaccination 08/27/2019, 09/21/2019, 06/21/2020   Tdap 01/28/2013, 09/02/2017   Health Maintenance Due  Topic Date Due   Mammogram  Never done    4. Cervical cancer screening-  doing today 5. Breast cancer screening-  mammogram done 2025 6. Colon cancer screening - never, ordering today 7. Skin cancer screening- advised regular sunscreen use. Denies worrisome, changing, or new skin lesions.  8. Birth control/STD check- none, declined STD check today 9.  Osteoporosis screening- N/A  10. Alcohol screening: daily, 1 glass of wine or beer 11. Smoking associated screening (lung cancer screening, AAA screen 65-75, UA)-  non- smoker 12. Exercise- 4d/week at gym Assessment & Plan Woman's Wellness Visit Routine wellness visit with discussions on alcohol intake, exercise, and memory concerns. Alcohol intake reduced but above recommended levels. Exercise paused due to sinus infection. Memory concerns possibly related to stress, sleep, or nutrition. No birth control use, regular menstrual cycles. Recent mammogram completed. Colon cancer screening discussed. - Sent referral for colonoscopy. - PAP smear testing performed. - Encouraged reduction of alcohol intake to less than a glass per day. - Encouraged resumption of regular exercise. - Recommended vitamin D  and B complex supplementation. - Checked full CPE lab panel.  Constipation No current constipation. Previous issues noted. Discussed hydration, fiber intake, and exercise importance. - Encouraged hydration with 2-2.5 liters of water  daily. - Recommended dietary fiber intake through fruits, greens, grains, and beans. - Suggested over-the-counter fiber supplements like Benefiber or Citrucel. - Advised use of stool softeners if fiber is insufficient. - Encouraged resumption of regular exercise.  Alcohol use Alcohol intake reduced but above recommended levels. Discussed risks of alcohol dependence and caloric intake. - Encouraged reduction of alcohol intake to less than a glass per day.  Recommended follow up:  Return for any future concerns, Complete physical w/fasting labs. Future Appointments  Date Time Provider Department Center  06/16/2024  8:00 AM [facility] None    Lab/Order associations:  non-fasting   Dennys Traughber, Corean, NP        [1]  Allergies Allergen Reactions   Contrast Media [Iodinated Contrast Media] Anaphylaxis, Shortness  Of Breath and Rash    IV  contrast    Nsaids Other (See Comments)    Hx gastritis & gastric perforation    Prednisone  & Diphenhydramine  Other (See Comments)    Don't like to take it   Prednisone      Other reaction(s): Other Don't like to take it

## 2024-05-31 LAB — CYTOLOGY - PAP
Adequacy: ABSENT
Chlamydia: NEGATIVE
Comment: NEGATIVE
Comment: NEGATIVE
Comment: NEGATIVE
Comment: NORMAL
Diagnosis: NEGATIVE
High risk HPV: NEGATIVE
Neisseria Gonorrhea: NEGATIVE
Trichomonas: NEGATIVE

## 2024-06-01 ENCOUNTER — Ambulatory Visit: Payer: Self-pay | Admitting: Family

## 2024-06-02 ENCOUNTER — Encounter: Admitting: Family

## 2024-06-16 ENCOUNTER — Other Ambulatory Visit

## 2024-06-25 LAB — GENECONNECT MOLECULAR SCREEN: Genetic Analysis Overall Interpretation: NEGATIVE

## 2025-05-30 ENCOUNTER — Encounter: Admitting: Family
